# Patient Record
Sex: Female | Born: 1975 | Race: Black or African American | Hispanic: No | Marital: Married | Smoking: Never smoker
Health system: Southern US, Community
[De-identification: ages and names within clinical notes are randomized; demographics above are authoritative.]

## PROBLEM LIST (undated history)

## (undated) DIAGNOSIS — Z9109 Other allergy status, other than to drugs and biological substances: Secondary | ICD-10-CM

## (undated) DIAGNOSIS — J302 Other seasonal allergic rhinitis: Secondary | ICD-10-CM

## (undated) DIAGNOSIS — J449 Chronic obstructive pulmonary disease, unspecified: Secondary | ICD-10-CM

## (undated) DIAGNOSIS — N920 Excessive and frequent menstruation with regular cycle: Secondary | ICD-10-CM

## (undated) DIAGNOSIS — Z5189 Encounter for other specified aftercare: Secondary | ICD-10-CM

## (undated) DIAGNOSIS — E785 Hyperlipidemia, unspecified: Secondary | ICD-10-CM

## (undated) DIAGNOSIS — G4730 Sleep apnea, unspecified: Secondary | ICD-10-CM

## (undated) DIAGNOSIS — D219 Benign neoplasm of connective and other soft tissue, unspecified: Secondary | ICD-10-CM

## (undated) DIAGNOSIS — R0602 Shortness of breath: Secondary | ICD-10-CM

## (undated) DIAGNOSIS — I10 Essential (primary) hypertension: Secondary | ICD-10-CM

## (undated) DIAGNOSIS — K219 Gastro-esophageal reflux disease without esophagitis: Secondary | ICD-10-CM

## (undated) DIAGNOSIS — D649 Anemia, unspecified: Secondary | ICD-10-CM

## (undated) DIAGNOSIS — D259 Leiomyoma of uterus, unspecified: Secondary | ICD-10-CM

## (undated) HISTORY — PX: CHOLECYSTECTOMY: SHX55

## (undated) HISTORY — DX: Excessive and frequent menstruation with regular cycle: N92.0

## (undated) HISTORY — DX: Hyperlipidemia, unspecified: E78.5

## (undated) HISTORY — PX: ECTOPIC PREGNANCY SURGERY: SHX613

## (undated) HISTORY — PX: WISDOM TOOTH EXTRACTION: SHX21

---

## 1998-06-04 ENCOUNTER — Emergency Department (HOSPITAL_COMMUNITY): Admission: EM | Admit: 1998-06-04 | Discharge: 1998-06-04 | Payer: Self-pay | Admitting: Emergency Medicine

## 2000-03-05 ENCOUNTER — Inpatient Hospital Stay (HOSPITAL_COMMUNITY): Admission: AD | Admit: 2000-03-05 | Discharge: 2000-03-05 | Payer: Self-pay | Admitting: Obstetrics & Gynecology

## 2000-11-09 ENCOUNTER — Emergency Department (HOSPITAL_COMMUNITY): Admission: EM | Admit: 2000-11-09 | Discharge: 2000-11-09 | Payer: Self-pay | Admitting: Emergency Medicine

## 2002-05-02 ENCOUNTER — Inpatient Hospital Stay (HOSPITAL_COMMUNITY): Admission: AD | Admit: 2002-05-02 | Discharge: 2002-05-02 | Payer: Self-pay | Admitting: *Deleted

## 2002-05-30 ENCOUNTER — Encounter: Admission: RE | Admit: 2002-05-30 | Discharge: 2002-05-30 | Payer: Self-pay | Admitting: *Deleted

## 2002-05-31 ENCOUNTER — Ambulatory Visit (HOSPITAL_COMMUNITY): Admission: RE | Admit: 2002-05-31 | Discharge: 2002-05-31 | Payer: Self-pay | Admitting: Obstetrics and Gynecology

## 2002-07-03 ENCOUNTER — Ambulatory Visit (HOSPITAL_COMMUNITY): Admission: RE | Admit: 2002-07-03 | Discharge: 2002-07-03 | Payer: Self-pay | Admitting: Obstetrics and Gynecology

## 2002-07-03 ENCOUNTER — Encounter (INDEPENDENT_AMBULATORY_CARE_PROVIDER_SITE_OTHER): Payer: Self-pay

## 2002-07-18 ENCOUNTER — Encounter: Admission: RE | Admit: 2002-07-18 | Discharge: 2002-07-18 | Payer: Self-pay | Admitting: *Deleted

## 2002-09-21 ENCOUNTER — Encounter: Payer: Self-pay | Admitting: Emergency Medicine

## 2002-09-21 ENCOUNTER — Emergency Department (HOSPITAL_COMMUNITY): Admission: EM | Admit: 2002-09-21 | Discharge: 2002-09-21 | Payer: Self-pay | Admitting: Emergency Medicine

## 2002-11-01 ENCOUNTER — Inpatient Hospital Stay (HOSPITAL_COMMUNITY): Admission: AD | Admit: 2002-11-01 | Discharge: 2002-11-01 | Payer: Self-pay | Admitting: Obstetrics and Gynecology

## 2002-11-29 ENCOUNTER — Emergency Department (HOSPITAL_COMMUNITY): Admission: EM | Admit: 2002-11-29 | Discharge: 2002-11-29 | Payer: Self-pay | Admitting: Emergency Medicine

## 2003-03-26 ENCOUNTER — Encounter: Payer: Self-pay | Admitting: Emergency Medicine

## 2003-03-26 ENCOUNTER — Emergency Department (HOSPITAL_COMMUNITY): Admission: EM | Admit: 2003-03-26 | Discharge: 2003-03-26 | Payer: Self-pay | Admitting: Emergency Medicine

## 2003-06-26 ENCOUNTER — Emergency Department (HOSPITAL_COMMUNITY): Admission: EM | Admit: 2003-06-26 | Discharge: 2003-06-26 | Payer: Self-pay | Admitting: *Deleted

## 2003-07-12 ENCOUNTER — Emergency Department (HOSPITAL_COMMUNITY): Admission: EM | Admit: 2003-07-12 | Discharge: 2003-07-12 | Payer: Self-pay | Admitting: Emergency Medicine

## 2003-09-02 ENCOUNTER — Emergency Department (HOSPITAL_COMMUNITY): Admission: EM | Admit: 2003-09-02 | Discharge: 2003-09-02 | Payer: Self-pay | Admitting: *Deleted

## 2003-12-14 ENCOUNTER — Emergency Department (HOSPITAL_COMMUNITY): Admission: EM | Admit: 2003-12-14 | Discharge: 2003-12-14 | Payer: Self-pay | Admitting: Emergency Medicine

## 2004-04-06 ENCOUNTER — Inpatient Hospital Stay (HOSPITAL_COMMUNITY): Admission: EM | Admit: 2004-04-06 | Discharge: 2004-04-08 | Payer: Self-pay

## 2004-06-11 ENCOUNTER — Emergency Department (HOSPITAL_COMMUNITY): Admission: EM | Admit: 2004-06-11 | Discharge: 2004-06-11 | Payer: Self-pay | Admitting: Emergency Medicine

## 2004-06-13 ENCOUNTER — Emergency Department (HOSPITAL_COMMUNITY): Admission: EM | Admit: 2004-06-13 | Discharge: 2004-06-13 | Payer: Self-pay | Admitting: Emergency Medicine

## 2004-10-07 ENCOUNTER — Inpatient Hospital Stay (HOSPITAL_COMMUNITY): Admission: EM | Admit: 2004-10-07 | Discharge: 2004-10-10 | Payer: Self-pay | Admitting: *Deleted

## 2005-01-12 ENCOUNTER — Ambulatory Visit: Payer: Self-pay | Admitting: Nurse Practitioner

## 2005-01-16 ENCOUNTER — Ambulatory Visit: Payer: Self-pay | Admitting: *Deleted

## 2005-02-02 ENCOUNTER — Ambulatory Visit: Payer: Self-pay | Admitting: Nurse Practitioner

## 2005-04-07 ENCOUNTER — Emergency Department (HOSPITAL_COMMUNITY): Admission: EM | Admit: 2005-04-07 | Discharge: 2005-04-08 | Payer: Self-pay | Admitting: *Deleted

## 2005-06-11 ENCOUNTER — Ambulatory Visit: Payer: Self-pay | Admitting: *Deleted

## 2005-06-12 ENCOUNTER — Ambulatory Visit: Payer: Self-pay | Admitting: Nurse Practitioner

## 2005-10-20 ENCOUNTER — Ambulatory Visit: Payer: Self-pay | Admitting: Nurse Practitioner

## 2005-12-17 ENCOUNTER — Inpatient Hospital Stay (HOSPITAL_COMMUNITY): Admission: EM | Admit: 2005-12-17 | Discharge: 2005-12-21 | Payer: Self-pay | Admitting: Emergency Medicine

## 2006-03-29 ENCOUNTER — Ambulatory Visit: Payer: Self-pay | Admitting: Nurse Practitioner

## 2006-04-15 ENCOUNTER — Inpatient Hospital Stay (HOSPITAL_COMMUNITY): Admission: EM | Admit: 2006-04-15 | Discharge: 2006-04-17 | Payer: Self-pay | Admitting: Emergency Medicine

## 2006-06-09 ENCOUNTER — Ambulatory Visit: Payer: Self-pay | Admitting: Internal Medicine

## 2006-06-11 ENCOUNTER — Ambulatory Visit: Payer: Self-pay | Admitting: Internal Medicine

## 2006-06-24 ENCOUNTER — Emergency Department (HOSPITAL_COMMUNITY): Admission: EM | Admit: 2006-06-24 | Discharge: 2006-06-25 | Payer: Self-pay | Admitting: Emergency Medicine

## 2006-08-20 ENCOUNTER — Inpatient Hospital Stay (HOSPITAL_COMMUNITY): Admission: EM | Admit: 2006-08-20 | Discharge: 2006-08-27 | Payer: Self-pay | Admitting: Emergency Medicine

## 2006-11-08 ENCOUNTER — Inpatient Hospital Stay (HOSPITAL_COMMUNITY): Admission: AD | Admit: 2006-11-08 | Discharge: 2006-11-08 | Payer: Self-pay | Admitting: Obstetrics and Gynecology

## 2007-06-14 ENCOUNTER — Emergency Department (HOSPITAL_COMMUNITY): Admission: EM | Admit: 2007-06-14 | Discharge: 2007-06-14 | Payer: Self-pay | Admitting: Emergency Medicine

## 2007-08-11 ENCOUNTER — Inpatient Hospital Stay (HOSPITAL_COMMUNITY): Admission: AD | Admit: 2007-08-11 | Discharge: 2007-08-11 | Payer: Self-pay | Admitting: Obstetrics & Gynecology

## 2007-09-13 ENCOUNTER — Emergency Department (HOSPITAL_COMMUNITY): Admission: EM | Admit: 2007-09-13 | Discharge: 2007-09-13 | Payer: Self-pay | Admitting: Family Medicine

## 2007-10-16 ENCOUNTER — Emergency Department (HOSPITAL_COMMUNITY): Admission: EM | Admit: 2007-10-16 | Discharge: 2007-10-16 | Payer: Self-pay | Admitting: Emergency Medicine

## 2008-11-21 ENCOUNTER — Encounter: Admission: RE | Admit: 2008-11-21 | Discharge: 2008-11-21 | Payer: Self-pay | Admitting: Cardiovascular Disease

## 2009-04-29 ENCOUNTER — Emergency Department (HOSPITAL_COMMUNITY): Admission: EM | Admit: 2009-04-29 | Discharge: 2009-04-29 | Payer: Self-pay | Admitting: Emergency Medicine

## 2009-06-23 ENCOUNTER — Emergency Department (HOSPITAL_COMMUNITY): Admission: EM | Admit: 2009-06-23 | Discharge: 2009-06-23 | Payer: Self-pay | Admitting: Emergency Medicine

## 2009-10-29 ENCOUNTER — Inpatient Hospital Stay (HOSPITAL_COMMUNITY): Admission: AD | Admit: 2009-10-29 | Discharge: 2009-10-30 | Payer: Self-pay | Admitting: Obstetrics and Gynecology

## 2010-02-08 ENCOUNTER — Emergency Department (HOSPITAL_COMMUNITY): Admission: EM | Admit: 2010-02-08 | Discharge: 2010-02-09 | Payer: Self-pay | Admitting: Emergency Medicine

## 2010-02-17 ENCOUNTER — Inpatient Hospital Stay (HOSPITAL_COMMUNITY): Admission: AD | Admit: 2010-02-17 | Discharge: 2010-02-17 | Payer: Self-pay | Admitting: Obstetrics and Gynecology

## 2010-02-18 ENCOUNTER — Inpatient Hospital Stay (HOSPITAL_COMMUNITY): Admission: AD | Admit: 2010-02-18 | Discharge: 2010-02-18 | Payer: Self-pay | Admitting: Obstetrics and Gynecology

## 2010-02-20 ENCOUNTER — Ambulatory Visit (HOSPITAL_COMMUNITY): Admission: RE | Admit: 2010-02-20 | Discharge: 2010-02-20 | Payer: Self-pay | Admitting: Obstetrics and Gynecology

## 2010-02-23 ENCOUNTER — Ambulatory Visit: Admission: RE | Admit: 2010-02-23 | Discharge: 2010-02-23 | Payer: Self-pay | Admitting: Obstetrics and Gynecology

## 2010-02-26 ENCOUNTER — Inpatient Hospital Stay (HOSPITAL_COMMUNITY): Admission: AD | Admit: 2010-02-26 | Discharge: 2010-02-26 | Payer: Self-pay | Admitting: Obstetrics and Gynecology

## 2010-03-01 ENCOUNTER — Inpatient Hospital Stay (HOSPITAL_COMMUNITY): Admission: AD | Admit: 2010-03-01 | Discharge: 2010-03-01 | Payer: Self-pay | Admitting: Obstetrics and Gynecology

## 2010-03-08 ENCOUNTER — Inpatient Hospital Stay (HOSPITAL_COMMUNITY): Admission: AD | Admit: 2010-03-08 | Discharge: 2010-03-08 | Payer: Self-pay | Admitting: Obstetrics and Gynecology

## 2010-03-12 ENCOUNTER — Inpatient Hospital Stay (HOSPITAL_COMMUNITY): Admission: AD | Admit: 2010-03-12 | Discharge: 2010-03-14 | Payer: Self-pay | Admitting: Obstetrics and Gynecology

## 2010-03-12 ENCOUNTER — Encounter (INDEPENDENT_AMBULATORY_CARE_PROVIDER_SITE_OTHER): Payer: Self-pay | Admitting: Obstetrics and Gynecology

## 2010-07-07 ENCOUNTER — Emergency Department (HOSPITAL_COMMUNITY)
Admission: EM | Admit: 2010-07-07 | Discharge: 2010-07-07 | Payer: Self-pay | Source: Home / Self Care | Admitting: Emergency Medicine

## 2010-09-04 ENCOUNTER — Emergency Department (HOSPITAL_COMMUNITY)
Admission: EM | Admit: 2010-09-04 | Discharge: 2010-09-04 | Disposition: A | Discharge status: Home / Self Care | Payer: Medicare Other | Attending: Emergency Medicine | Admitting: Emergency Medicine

## 2010-09-04 DIAGNOSIS — J3489 Other specified disorders of nose and nasal sinuses: Secondary | ICD-10-CM | POA: Insufficient documentation

## 2010-09-04 DIAGNOSIS — R059 Cough, unspecified: Secondary | ICD-10-CM | POA: Insufficient documentation

## 2010-09-04 DIAGNOSIS — R05 Cough: Secondary | ICD-10-CM | POA: Insufficient documentation

## 2010-09-04 DIAGNOSIS — R51 Headache: Secondary | ICD-10-CM | POA: Insufficient documentation

## 2010-09-04 DIAGNOSIS — J45909 Unspecified asthma, uncomplicated: Secondary | ICD-10-CM | POA: Insufficient documentation

## 2010-09-04 LAB — POCT PREGNANCY, URINE: Preg Test, Ur: NEGATIVE

## 2010-09-25 LAB — CBC
HCT: 25.6 % — ABNORMAL LOW (ref 36.0–46.0)
HCT: 27.8 % — ABNORMAL LOW (ref 36.0–46.0)
Hemoglobin: 7.2 g/dL — ABNORMAL LOW (ref 12.0–15.0)
MCH: 20.2 pg — ABNORMAL LOW (ref 26.0–34.0)
MCHC: 31.4 g/dL (ref 30.0–36.0)
MCHC: 31.1 g/dL (ref 30.0–36.0)
MCV: 64.9 fL — ABNORMAL LOW (ref 78.0–100.0)
Platelets: 511 10*3/uL — ABNORMAL HIGH (ref 150–400)
RDW: 22.6 % — ABNORMAL HIGH (ref 11.5–15.5)
RDW: 23.2 % — ABNORMAL HIGH (ref 11.5–15.5)
WBC: 10 10*3/uL (ref 4.0–10.5)
WBC: 13.5 10*3/uL — ABNORMAL HIGH (ref 4.0–10.5)

## 2010-09-25 LAB — CROSSMATCH
ABO/RH(D): O POS
Antibody Screen: NEGATIVE

## 2010-09-25 LAB — CREATININE, SERUM: GFR calc non Af Amer: >60 mL/min (ref >60)

## 2010-09-26 LAB — DIFFERENTIAL
Basophils Absolute: 0.1 10*3/uL (ref 0.0–0.1)
Basophils Relative: 1 % (ref 0–1)
Eosinophils Absolute: 0.5 10*3/uL (ref 0.0–0.7)
Eosinophils Absolute: 0.7 10*3/uL (ref 0.0–0.7)
Lymphs Abs: 1.8 10*3/uL (ref 0.7–4.0)
Monocytes Absolute: 0.4 10*3/uL (ref 0.1–1.0)
Monocytes Absolute: 0.7 10*3/uL (ref 0.1–1.0)
Monocytes Relative: 3 % (ref 3–12)
Monocytes Relative: 5 % (ref 3–12)
Neutro Abs: 9.1 10*3/uL — ABNORMAL HIGH (ref 1.7–7.7)

## 2010-09-26 LAB — CBC
HCT: 30 % — ABNORMAL LOW (ref 36.0–46.0)
HCT: 32.6 % — ABNORMAL LOW (ref 36.0–46.0)
Hemoglobin: 10 g/dL — ABNORMAL LOW (ref 12.0–15.0)
Hemoglobin: 10 g/dL — ABNORMAL LOW (ref 12.0–15.0)
Hemoglobin: 9.1 g/dL — ABNORMAL LOW (ref 12.0–15.0)
MCH: 19.9 pg — ABNORMAL LOW (ref 26.0–34.0)
MCH: 20.1 pg — ABNORMAL LOW (ref 26.0–34.0)
MCHC: 30.8 g/dL (ref 30.0–36.0)
MCV: 64.7 fL — ABNORMAL LOW (ref 78.0–100.0)
MCV: 64.8 fL — ABNORMAL LOW (ref 78.0–100.0)
RBC: 4.99 MIL/uL (ref 3.87–5.11)
RDW: 24.4 % — ABNORMAL HIGH (ref 11.5–15.5)
WBC: 11.9 10*3/uL — ABNORMAL HIGH (ref 4.0–10.5)
WBC: 12.7 10*3/uL — ABNORMAL HIGH (ref 4.0–10.5)

## 2010-09-26 LAB — BUN: BUN: 7 mg/dL (ref 6–23)

## 2010-09-26 LAB — CREATININE, SERUM
Creatinine, Ser: 0.62 mg/dL (ref 0.4–1.2)
GFR calc Af Amer: >60 mL/min (ref >60)
GFR calc non Af Amer: >60 mL/min (ref >60)

## 2010-09-26 LAB — HCG, QUANTITATIVE, PREGNANCY: hCG, Beta Chain, Quant, S: 25751 m[IU]/mL — ABNORMAL HIGH (ref <5)

## 2010-09-26 LAB — AST: AST: 22 U/L (ref 0–37)

## 2010-09-27 LAB — TYPE AND SCREEN
ABO/RH(D): O POS
Antibody Screen: NEGATIVE

## 2010-09-27 LAB — LACTIC ACID, PLASMA: Lactic Acid, Venous: 1.8 mmol/L (ref 0.5–2.2)

## 2010-09-27 LAB — DIFFERENTIAL
Basophils Relative: 0 % (ref 0–1)
Eosinophils Relative: 4 % (ref 0–5)
Lymphs Abs: 1.9 10*3/uL (ref 0.7–4.0)
Monocytes Absolute: 0.8 10*3/uL (ref 0.1–1.0)
Monocytes Relative: 6 % (ref 3–12)

## 2010-09-27 LAB — URINALYSIS, ROUTINE W REFLEX MICROSCOPIC
Bilirubin Urine: NEGATIVE
Ketones, ur: NEGATIVE mg/dL
Leukocytes, UA: NEGATIVE
Nitrite: POSITIVE — AB
Protein, ur: NEGATIVE mg/dL
Urobilinogen, UA: 0.2 mg/dL (ref 0.0–1.0)
pH: 6.5 (ref 5.0–8.0)

## 2010-09-27 LAB — CBC
HCT: 31.5 % — ABNORMAL LOW (ref 36.0–46.0)
Hemoglobin: 9.9 g/dL — ABNORMAL LOW (ref 12.0–15.0)
MCH: 19.6 pg — ABNORMAL LOW (ref 26.0–34.0)
MCHC: 31.4 g/dL (ref 30.0–36.0)
MCV: 62.5 fL — ABNORMAL LOW (ref 78.0–100.0)
RBC: 5.05 MIL/uL (ref 3.87–5.11)

## 2010-09-27 LAB — POCT I-STAT, CHEM 8
BUN: 8 mg/dL (ref 6–23)
Calcium, Ion: 1.12 mmol/L (ref 1.12–1.32)
Chloride: 105 mEq/L (ref 96–112)
Creatinine, Ser: 0.9 mg/dL (ref 0.4–1.2)
TCO2: 22 mmol/L (ref 0–100)

## 2010-09-27 LAB — WET PREP, GENITAL: Trich, Wet Prep: NONE SEEN

## 2010-09-27 LAB — GC/CHLAMYDIA PROBE AMP, GENITAL: Chlamydia, DNA Probe: NEGATIVE

## 2010-09-30 LAB — URINALYSIS, ROUTINE W REFLEX MICROSCOPIC
Leukocytes, UA: NEGATIVE
Nitrite: NEGATIVE
Specific Gravity, Urine: >1.03 — ABNORMAL HIGH (ref 1.005–1.030)
Urobilinogen, UA: 0.2 mg/dL (ref 0.0–1.0)

## 2010-09-30 LAB — URINE CULTURE: Colony Count: 75000

## 2010-09-30 LAB — URINE MICROSCOPIC-ADD ON

## 2010-09-30 LAB — POCT PREGNANCY, URINE: Preg Test, Ur: NEGATIVE

## 2010-10-31 ENCOUNTER — Emergency Department (HOSPITAL_COMMUNITY): Payer: Medicare Other

## 2010-10-31 ENCOUNTER — Emergency Department (HOSPITAL_COMMUNITY)
Admission: EM | Admit: 2010-10-31 | Discharge: 2010-10-31 | Disposition: A | Discharge status: Home / Self Care | Payer: Medicare Other | Attending: Emergency Medicine | Admitting: Emergency Medicine

## 2010-10-31 DIAGNOSIS — J329 Chronic sinusitis, unspecified: Secondary | ICD-10-CM | POA: Insufficient documentation

## 2010-10-31 DIAGNOSIS — R112 Nausea with vomiting, unspecified: Secondary | ICD-10-CM | POA: Insufficient documentation

## 2010-10-31 DIAGNOSIS — R51 Headache: Secondary | ICD-10-CM | POA: Insufficient documentation

## 2010-10-31 LAB — COMPREHENSIVE METABOLIC PANEL
AST: 22 U/L (ref 0–37)
Albumin: 3.3 g/dL — ABNORMAL LOW (ref 3.5–5.2)
BUN: 5 mg/dL — ABNORMAL LOW (ref 6–23)
Calcium: 8.9 mg/dL (ref 8.4–10.5)
Creatinine, Ser: 0.76 mg/dL (ref 0.4–1.2)
GFR calc Af Amer: >60 mL/min (ref >60)
Total Protein: 7.3 g/dL (ref 6.0–8.3)

## 2010-10-31 LAB — POCT PREGNANCY, URINE: Preg Test, Ur: NEGATIVE

## 2010-11-28 NOTE — Op Note (Signed)
   NAMEGUILLERMINA, [name]                        ACCOUNT NO.:  [id number]   MEDICAL RECORD NO.:  [id number]                   PATIENT TYPE:  AMB   LOCATION:  SDC                                  FACILITY:  [facility]   PHYSICIAN:  Phil D. Okey Dupre, M.D.                  DATE OF BIRTH:  [birth year]   DATE OF PROCEDURE:  07/03/2002  DATE OF DISCHARGE:                                 OPERATIVE REPORT   PROCEDURE:  1. Diagnostic hysteroscopy.  2. Dilatation and curettage.   PREOPERATIVE DIAGNOSES:  Dysfunctional uterine bleeding.   POSTOPERATIVE DIAGNOSES:  Pending pathology report.   SURGEON:  Javier Glazier. Okey Dupre, M.D.   OPERATIVE FINDINGS:  With the hysteroscope, because of the amount of  bleeding in the endometrial cavity in spite of the amount of fluid put in,  we were unable to see anything definitive.  Significant amount of uterine  curettings, however, were obtained and sent for pathologic diagnosis.   PROCEDURE:  Under satisfactory general anesthesia the patient in a dorsal  lithotomy position, the perineum and vagina were prepped and draped in the  usual sterile manner.  Bimanual pelvic examination under anesthesia failed  to reveal the size of the uterus because of the patient's habitus.  Ovaries  likewise could not be palpated.  A weighted speculum was placed in the  posterior fourchette of the vagina and the anterior lip of a clean parous  cervix was grasped with a single tooth tenaculum.  The uterine cavity  sounded to a depth of 9 cm.  The cervical os dilated up to a number 8 Hegar  dilator and a 30 degree hysteroscope using normal saline as dilating median  was inserted into the uterine cavity to the fundus and many attempts were  made to visualize the endometrial cavity, but without success as  aforementioned.  The scope was then removed, the uterine cavity vigorously  curetted with a small serrated curette several times and a large amount of  endometrial tissue was obtained along  with some small dark clots.  This was  sent for pathological diagnosis.  A sponge on a packing forceps was then  used to complete procedure by endometrial wipe and free blood removed from  the vagina.  Tenaculum and speculum removed from the vagina.  The patient  was transferred to recovery room in satisfactory condition with 100 cc blood  loss.  The surgical specimen sent for pathological diagnosis were  endometrial curettings.                                               Phil D. Okey Dupre, M.D.    PDR/MEDQ  D:  07/03/2002  T:  07/03/2002  Job:  [id number]

## 2010-11-28 NOTE — Discharge Summary (Signed)
[name], [name]              ACCOUNT NO.:  [id number]   MEDICAL RECORD NO.:  [id number]           PATIENT TYPE:   LOCATION:                               FACILITY:  [facility]   PHYSICIAN:  Deirdre Peer. Polite, M.D. DATE OF BIRTH:  1975-10-02   DATE OF ADMISSION:  04/15/2006  DATE OF DISCHARGE:  04/17/2006                                 DISCHARGE SUMMARY   DISCHARGE DIAGNOSIS:  __________ .   CONDITION ON DISCHARGE:  Improved.   DISCHARGE MEDICATIONS:  1. DuoNebs every 4-6 hours.  2. Pulmicort rescues q.12 h.  3. The patient is to resume her __________ ,  Zyrtec, and Nasacort.  4. Will continue azithromycin 500 mg daily for three more days.   STUDIES:  1. Chest x-ray without infiltrate.  2. BMET.  3. CBC essentially unremarkable.  4. Peak flows in the range of 200-250 pre and post treatment.   HISTORY OF PRESENT ILLNESS:  This 35 year old female was admitted to the  hospital for shortness of breath and wheeze x3 days in the ED where she was  evaluated.  She was given appropriate treatment.  Despite the above the  patient continued with shortness of breath and wheezing.  Therefore  admission was deemed necessary.  Please see dictated H&P for further  details.   PAST MEDICAL HISTORY:  Per admission H&P.   MEDICATIONS ON ADMISSION:  As stated above.  Singulair, Zyrtec, albuterol.   SOCIAL HISTORY:  Negative for tobacco.   HOSPITAL COURSE:  The patient was admitted to stepdown unit for further  evaluation.  __________  the patient was treated with antibiotics, O2,  nebulizers, and IV steroids.  Please note the patient states she is allergic  to P.O. STEROIDS.  The patient's medical course was one of slow, but  continued improvement.  Today the patient is moving air well without  accessory muscle use, speaking in complete sentences.  Oxygen saturations  are 95% on room air.  The patient is felt to be medically stable for  discharge.  The only __________  is the patient's  documented allergy to p.o.  prednisone.  Inhaled steroids will be added to the patient's medical regimen  and the patient will be  given a loading dose of IV Solu-Medrol prior to discharge.  The patient will  continue medications as stated above.  She has been instructed to stay in a  cool, air-conditioned room and avoid any triggers of her asthma.  At this  time the patient is medically stable for discharge.      Deirdre Peer. Polite, M.D.  Electronically Signed     RDP/MEDQ  D:  04/17/2006  T:  04/18/2006  Job:  [id number]

## 2010-11-28 NOTE — H&P (Signed)
NAMEPERLA, [name]              ACCOUNT NO.:  [id number]   MEDICAL RECORD NO.:  [id number]          PATIENT TYPE:  INP   LOCATION:  0101                         FACILITY:  [facility]   PHYSICIAN:  Theresia Bough, MD       DATE OF BIRTH:  [birth year]   DATE OF ADMISSION:  08/20/2006  DATE OF DISCHARGE:                              HISTORY & PHYSICAL   PRESENTING COMPLAINT:  Cough and wheezing.   HISTORY OF PRESENT ILLNESS:  This is a 35 year old female patient who  presented with cough.  She has been wheezing for the past one week.  She  has been taking Zyrtec, Singulair, and DuoNeb at home, which has not  resolved the problems.  She has a history of asthma.  She comes to  admission in the hospital about 2-3 times every year.  She came into the  hospital today because she has not been feeling better.  She denies any  nausea.  No vomiting or diarrhea.  No headaches.  No dysuria.  No feet  swelling.  No joint pain.   PAST MEDICAL HISTORY:  Asthma.   SOCIAL HISTORY:  She does not smoke cigarettes.  She does not use any  alcohol.   FAMILY HISTORY:  Noncontributory.   HOME MEDICATIONS:  Singulair, Zyrtec, albuterol, DuoNeb.   She has allergies to PREDNISONE, which causes a rash.   PHYSICAL EXAMINATION:  VITAL SIGNS:  Blood pressure 140/90, pulse rate  99, respiratory rate 18, oxygen saturation 96% on room air.  HEAD/NECK:  Pink conjunctivae.  She has no jaundice.  Her neck is  supple.  Mucous membranes are moist.  CHEST:  Bilateral wheezing.  CARDIOVASCULAR:  Normal heart sounds.  No murmur.  No gallop.  ABDOMEN:  Soft and nontender.  No masses palpable.  Patient has normal  bowel sounds.  EXTREMITIES:  No edema.  CENTRAL NERVOUS SYSTEM:  Patient is alert and oriented x3.  Pulse is 4/4  in all limbs.  Gait is normal.   Blood work shows a WBC of 15.1, hemoglobin 11, hematocrit 34, platelets  456.  Sodium 133, potassium 3.6, chloride 104, bicarb 23, BUN 7,  creatinine 0.7, glucose  107.   ASSESSMENT:  1. Asthma exacerbation.  2. Leukocytosis.   The plan is to admit the patient to a medical floor.  Continue albuterol  2.5 mg nebs q.6h., Atrovent 0.5 mg nebs q.6h. and q.2h. p.r.n. wheezing.  I will continue on Singulair 10 mg daily, Zyrtec 10 mg p.o. daily, and I  will give IV  Zithromax 500 mg q.24h.  I will also give IV dexamethasone 4 mg q.6h. to  begin  at 4:30 a.m. on Saturday.  Patient has received dexamethasone 10  mg p.o. at about 4:30 p.m. this afternoon.  I will also give Lovenox 40  mg daily for DVT prophylasix.      Theresia Bough, MD  Electronically Signed     GA/MEDQ  D:  08/20/2006  T:  08/20/2006  Job:  [id number]

## 2010-11-28 NOTE — H&P (Signed)
[name], [name]              ACCOUNT NO.:  [id number]   MEDICAL RECORD NO.:  [id number]          PATIENT TYPE:  INP   LOCATION:  0103                         FACILITY:  [facility]   PHYSICIAN:  Deirdre Peer. Polite, M.D. DATE OF BIRTH:  10/09/1975   DATE OF ADMISSION:  04/15/2006  DATE OF DISCHARGE:                                HISTORY & PHYSICAL   CHIEF COMPLAINT:  Asthma exacerbation.   HISTORY OF PRESENT ILLNESS:  A 35 year old female with a known history of  asthma who presents to the ED for evaluation of the above chief complaint.  According to the patient, she had started with upper respiratory/cold  symptoms on Friday associated with productive cough of yellow-green sputum  and shortness of breath.  Patient took Theraflu at home without improvement  in her symptoms.  Patient's symptoms progressively got worse over the  weekend with chest tightness and wheezing.  Because of that, she presented  to the ED.  In the ED, the patient was evaluated and had a low-grade temp of  99.  Chest x-ray without infiltrate.  The patient was given appropriate  treatment in the ED without improvement.  Admission was deemed necessary for  further evaluation and treatment.  Of note, the patient did have a sick  contact at home, her younger brother, age 88, with cold symptoms.   PAST MEDICAL HISTORY:  Significant for:  1. Asthma.  2. 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  3. The patient denies diabetes or hypertension.   MEDICATIONS ON ADMISSION:  Singulair, Zyrtec, albuterol.   SOCIAL HISTORY:  Negative for tobacco, alcohol or drugs.   PAST SURGICAL HISTORY:  The patient had removal of a thyroid mass 4 years  ago, states that the path was benign.   ALLERGIES:  STATES ALLERGY TO PREDNISONE WHICH CAUSES A RASH.   FAMILY HISTORY:  Mother with diabetes, hypertension.  In other family  members, their medical history was unremarkable.   REVIEW OF SYSTEMS:  As stated  in the HPI.   PHYSICAL EXAM:  GENERAL:  The patient is in moderate distress secondary  shortness of breath.  VITALS:  Temp 99, BP 144/89, pulse 110, respiratory rate 20, sating 98%.  HEENT EXAM:  Anicteric sclera, moist oral mucosa.  NECK:  Supple, no adenopathy.  CHEST:  Bilateral expiratory wheeze with poor air movement.  CARDIOVASCULAR:  Regular.  ABDOMEN:  Obese, no hepatosplenomegaly appreciated.  EXTREMITIES:  No edema.  NEURO EXAM:  Nonfocal.   ASSESSMENT:  1. Asthma exacerbation which appears to have started from an upper      respiratory infection.  Currently the patient is with poor air movement      and accessory muscles used for respiration, requiring inpatient      hospitalization.  2. Elevated blood pressure.  By report, patient denies history of      hypertension.  3. Obesity.   Recommend the patient be admitted to a Step Down Unit for treatment of  respiratory distress associated with asthma exacerbation.  The patient will  receive IV antibiotics, O2 nebs, steroids.  Will obtain a sputum culture if  possible.  Will make further recommendations as deemed necessary.      Deirdre Peer. Polite, M.D.  Electronically Signed     RDP/MEDQ  D:  04/15/2006  T:  04/15/2006  Job:  [id number]

## 2010-11-28 NOTE — Discharge Summary (Signed)
NAMEYOBANA, CULLITON              ACCOUNT NO.:  [id number]   MEDICAL RECORD NO.:  [id number]          PATIENT TYPE:  INP   LOCATION:  0382                         FACILITY:  [facility]   PHYSICIAN:  Corinna L. Lendell Caprice, MDDATE OF BIRTH:  05-Aug-1975   DATE OF ADMISSION:  10/07/2004  DATE OF DISCHARGE:  10/10/2004                                 DISCHARGE SUMMARY   DIAGNOSES:  1.  Community-acquired pneumonia.  2.  Asthma exacerbation.  3.  Obesity.   DISCHARGE MEDICATIONS:  1.  Avelox 400 mg daily for seven more days.  2.  Prednisone taper as directed.  3.  Advair 250/50 one puff b.i.d.  4.  Albuterol MDI two puffs every four hours as needed for wheezing.   ACTIVITY:  No heavy exertion for seven days.  She may return to work in 10  days.   DIET:  Regular.   DISCHARGE INSTRUCTIONS:  She is to measure her peak flows daily.  She is to  follow up with primary care physician of her choice in four to six weeks to  ensure resolution of pneumonia.   CONDITION ON DISCHARGE:  Stable.   CONSULTATIONS:  None.   PROCEDURES:  None.   PERTINENT LABORATORIES:  ABG on room air shows pH of 7.410, PCO2 30, PO2 73.  Complete metabolic panel significant for a glucose of 143. CBC significant  for hemoglobin of 11.7, white blood cell count of 11, platelet count of 432  with 95% neutrophils.  LFTs within normal limits.  Chest x-ray showed  interstitial pneumonia.   HISTORY AND HOSPITAL COURSE:  Ms. [name] is a pleasant, unassigned 35-year-  old black female with a history of asthma who presented with complaints of  worsening dyspnea over the previous two days.  She had a cough productive of  yellow sputum for several weeks and chest tightness.  She also reported  rhinorrhea.   On exam, she had diffuse wheezing.  Please see H&P for complete details.  The patient was found to have a pneumonia and started on oxygen, nebulizers,  steroids, Avelox, and Advair.  Her peak flows were measured.  At  the  time of discharge her wheezing had diminished tremendously.  She was able to  ambulate the halls with minimal difficulty and was feeling better.  She has  no primary care physician and I am referring her to the [facility]  Line to find a primary care physician.      CLS/MEDQ  D:  10/10/2004  T:  10/10/2004  Job:  [id number]

## 2010-11-28 NOTE — H&P (Signed)
NAMEMASSA, PE              ACCOUNT NO.:  [id number]   MEDICAL RECORD NO.:  [id number]          PATIENT TYPE:  INP   LOCATION:  0382                         [facility]ACILITY:  [facility]   PHYSICIAN:  Kela Millin, M.D.DATE OF BIRTH:  1976-04-13   DATE OF ADMISSION:  10/07/2004  DATE OF DISCHARGE:                                HISTORY & PHYSICAL   PRIMARY CARE PHYSICIAN:  Unassigned.   HISTORY OF PRESENT ILLNESS:  The patient is a 35 year old black female with  a past medical history significant for asthma, who presents with complaints  of worsening shortness of breath x2 days.  She states that she has had a  cough productive of yellowish sputum x2 weeks, and associated chest  tightness.  The patient admits to nasal congestion, as well as rhinorrhea,  that she states she has all the time.  She denies fevers, dysuria, nausea,  vomiting, hematemesis, hematochezia, diarrhea, melena.   The patient was seen in the ER, and following nebulized bronchodilators and  IV steroids, her shortness of breath persisted, and she is admitted to the  [facility] hospitalist service to further evaluation and management.   PAST MEDICAL HISTORY:  1.  As above.  2.  History of microcytic anemia.  3.  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   MEDICATIONS:  1.  Albuterol M.D.I.  2.  Atrovent M.D.I.  3.  Flovent.  4.  Iron supplements.  States she has not taken any for some time because      she ran out.   REACTIONS:  PREDNISONE causes acne breakout/(?) a rash.   SOCIAL HISTORY:  She denies tobacco.  Occasional alcohol.   FAMILY HISTORY:  Her mom has hypertension and diabetes.   REVIEW OF SYSTEMS:  As per HPI.  Other comprehensive review of systems  negative.   PHYSICAL EXAMINATION:  GENERAL:  The patient is a young black female, alert  and oriented x3, in no acute distress.  VITAL SIGNS:  Blood pressure 140/95, pulse 89, respiratory rate 22, O2  saturation 93%.  HEENT:   Pupils equal, round and reactive to light.  Extraocular movements  intact.  Moist mucous membranes.  Sclerae were anicteric.  No oral exudates.  NECK:  Supple.  No adenopathy.  No thyromegaly.  No JVD.  LUNGS:  Diffuse wheezes bilaterally.  CARDIOVASCULAR:  Tachycardic.  Normal S1 and S2.  Regular rhythm.  ABDOMEN:  Soft, obese.  Bowel sounds present.  Nontender, nondistended.  No  organomegaly.  No masses palpable.  EXTREMITIES:  No cyanosis or edema.  NEUROLOGIC:  Alert and oriented x3.  Nonfocal exam.   LABORATORY DATA:  Chest x-ray, CMET, CBC, and ABG all pending.   ASSESSMENT AND PLAN:  1.  Asthma exacerbation.  Will continue nebulized bronchodilators.  Change      to Xopenex, IV Solu-Medrol, antibiotics, and also add __________.  Will      follow a chest x-ray, CBC, and ABG above.  2.  History of microcytic anemia.  Follow CBC, as above.  3.  Obesity.      ACV/MEDQ  D:  10/07/2004  T:  10/07/2004  Job:  [id number]

## 2010-11-28 NOTE — H&P (Signed)
[name], [name]              ACCOUNT NO.:  [id number]   MEDICAL RECORD NO.:  [id number]          PATIENT TYPE:  EMS   LOCATION:  ED                           FACILITY:  [facility]   PHYSICIAN:  Deirdre Peer. Polite, M.D. DATE OF BIRTH:  [birth year]   DATE OF ADMISSION:  12/17/2005  DATE OF DISCHARGE:                                HISTORY & PHYSICAL   CHIEF COMPLAINT:  Shortness of breath and wheeze.   HISTORY OF PRESENT ILLNESS:  A 35 year old female with known history of  asthma, presents to the ED with intractable shortness of breath, wheezing,  cough consistent with her typical asthma exacerbation.  The patient's  symptoms have been going on for about a week, cough with occasional  productive cough, increased use of inhalers.  The patient states her typical  triggers for asthma are a change in season, tobacco smoke, perfumes,  colognes.  The patient states that she does not smoke.  She has been taking  her medicines as tolerated.  The patient states that she has been around  someone that is a heavy smoker and that may be somewhat of a contributing  factor to her current exacerbation.  In the ED, the patient has been  evaluated.  She is afebrile, but with significant wheeze, chest x-ray with  peribronchial thickening, no infiltrate. [facility] hospitalist was called for  evaluation and admission.  At the time of my evaluation, the patient is  still very dyspnea and complaining of shortness of breath and admission is  deemed necessary for further evaluation and treatment.   PAST MEDICAL HISTORY:  Past medical history is significant for asthma.   MEDICATIONS ON ADMISSION:  Medications on admission include Singulair,  Zyrtec and albuterol.   SOCIAL HISTORY:  Social history is negative for tobacco, alcohol or drugs.   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   ALLERGIES:  Reports allergy to PREDNISONE, which causes a rash.   FAMILY HISTORY:   Noncontributory.   PHYSICAL EXAMINATION:  VITAL SIGNS:  Temperature 97.9, BP 172/104, pulse 93,  respiratory rate of 26, SAT 99%.  HEENT:  Within normal limits.  CHEST:  Bilateral expiratory wheeze.  CARDIOVASCULAR:  Regular.  ABDOMEN:  Nontender.  EXTREMITIES:  No edema.   DATA:  Chest x-ray:  Peribronchial thickening, no infiltrate.   Other labs are pending at the time of this dictation.   ASSESSMENT:  Asthma exacerbation.   RECOMMENDATION:  The patient will be admitted to a stepdown unit.  The  patient will be treated with IV antibiotics, O2, nebulizers and steroids.  We will attempt to obtain a sputum culture and make further recommendations  as deemed necessary.      Deirdre Peer. Polite, M.D.  Electronically Signed     RDP/MEDQ  D:  12/17/2005  T:  12/17/2005  Job:  [id number]

## 2010-11-28 NOTE — Discharge Summary (Signed)
[name], [name]              ACCOUNT NO.:  [id number]   MEDICAL RECORD NO.:  [id number]          PATIENT TYPE:  INP   LOCATION:  1332                         FACILITY:  [facility]   PHYSICIAN:  Marcellus Scott, MD     DATE OF BIRTH:  1975-08-10   DATE OF ADMISSION:  08/20/2006  DATE OF DISCHARGE:                               DISCHARGE SUMMARY   PRIMARY CARE PHYSICIAN:  Dr. Duke Salvia at [facility]   DISCHARGE DIAGNOSES:  1. Acute asthma exacerbation.  2. Leukocytosis.  3. Microcytic anemia.  4. Hyperglycemia.  5. Obesity.   DISCHARGE MEDICATIONS:  To be finalized on actual discharge, but may  include:  1. Singulair 10 mg p.o. daily.  2. Claritin 10 mg p.o. daily.  3. Mucinex 600 mg p.o. b.i.d.  4. Prednisone taper.  5. Atrovent 17 mcg per spray 2 puffs inhaled q.i.d. p.r.n.  6. Xopenex HFA 2 puffs q.4-6h. p.r.n.  7. Zithromax to complete a 1 week course.  8. Robitussin DM 10 mL p.o. q.4h. p.r.n.   PROCEDURES:  August 20, 2006, chest x-ray.  Impression is no acute  findings.   CONSULTATIONS:  None.   HOSPITAL COURSE AND PATIENT DISPOSITION:  For details of the initial  part of the admission, please refer to the history and physical note  that was done by Dr. Hadley Pen on August 20, 2006.  In summary, [name]  is a pleasant 35 year old female with history of asthma, anemia, who  presented with cough, wheezing for 1 week prior to admission which was  not relieved with home treatment of Singulair, Zyrtec, and DuoNeb.  So  she was evaluated in the hospital and assessed to have an acute asthma  exacerbation.   1. Acute acute exacerbation.  The patient was admitted to a medical      bed.  She was placed on oxygen, bronchodilator nebulizations of      initially albuterol which was changed to Xopenex and Atrovent.  She      was also initially on dexamethasone which was switched to IV Solu-      Medrol.  She was placed on IV Zithromax.  Mucinex was added to      loosen up  the secretions and Robitussin DM for cough.  Over the      next 24-48 hours, the patient did not make significant recovery      following which Solu-Medrol was increased to 80 mg q.4h.  Since      then, the patient has gradually improved with decrease in her      dyspnea.  She still has some cough and is generally weak.  She will      be switched to oral steroids from today and if she continues to      progress well, she may be able to go home tomorrow on oral      antibiotics course as well as steroid taper and bronchodilator      inhalers.  2. Leukocytosis.  This is probably secondary to stress and the effect      of the steroids.  There is  no focus of sepsis.  This is to be      rechecked with a CBC tomorrow.  3. Microcytic anemia which is probably secondary to her menorrhagia.      This has to be worked up as an outpatient as deemed necessary.  4. Obesity.  The patient has been counseled regarding diet and weight      loss.  5. Hyperglycemia probably secondary to steroids.  The patient was      placed on a sliding-scale insulin. With a tapering off of the      steroids this should normalize, and this has to be followed up.  6. Hyponatremia on admission which has resolved.      Marcellus Scott, MD  Electronically Signed     AH/MEDQ  D:  08/24/2006  T:  08/24/2006  Job:  [id number]

## 2010-11-28 NOTE — H&P (Signed)
NAMEJENISSA, [name]              ACCOUNT NO.:  [id number]   MEDICAL RECORD NO.:  [id number]          PATIENT TYPE:  INP   LOCATION:  0464                         FACILITY:  [facility]   PHYSICIAN:  Mallory Shirk, MD     DATE OF BIRTH:  1975-11-12   DATE OF ADMISSION:  04/06/2004  DATE OF DISCHARGE:                                HISTORY & PHYSICAL   CHIEF COMPLAINT:  Difficulty breathing/asthma exacerbation.   HISTORY OF PRESENT ILLNESS:  A 35 year old African-American lady with a  history of asthma since childhood diagnosed early in childhood, on inhaler  since age 83, presents with difficulty breathing.  The patient states that  symptoms started about a week ago, when she had some rhinorrhea, continuous  cough, no fever or chills.  Albuterol inhalers did not help, so she tried  albuterol nebulizers which also did not help.  This a.m., when she woke up,  she was coughing continuously, had trouble breathing, and presented to the  emergency room.   The patient was seen in this emergency room in June 2005 with a similar  exacerbation and was discharged on Atrovent and albuterol nebs.  The patient  states that she could not fill the prescription for Atrovent because of  financial reasons.  Hence, did not fill that prescription.  However, her  asthma was well-managed by albuterol inhalers until last week.   PAST MEDICAL HISTORY:  1.  Asthma diagnosed in childhood.  On inhaler since 35 years of age.  2.  Thyroid tumors surgically removed at [facility] in December 2003.   MEDICATIONS ON ADMISSION:  1.  Albuterol inhalers, nebs.  2.  Over-the-counter cold medications (store-bought pseudofed per patient).   ALLERGIES:  NKDA.   FAMILY HISTORY:  Significant for diabetes and hypertension in mother,  hypertension in father, has five brothers in good health except seasonal  allergies.   SOCIAL HISTORY:  The patient is a Lawyer, works as a Cabin crew.  She is single.  Does not  smoke.  Occasional alcohol use 2-3 times a week,  Bacardi coolers.   REVIEW OF SYSTEMS:  HEENT:  Mild headaches, no neck pain, no changes in  vision, no dysphagia, no leg stiffness.  CVS:  Chest pain secondary to  cough, tightness secondary to cough, but no continuous chest pain.  RESPIRATORY:  Positive shortness of breath, exertional dyspnea.  ABDOMEN:  No pain, no nausea, vomiting, diarrhea, or trace change in bowel habits  recently.  MUSCULOSKELETAL:  Left ankle swollen.  The patient has had an  injury to this ankle several years ago and periodically swells up and is  sometimes tender.   PHYSICAL EXAMINATION:  VITAL SIGNS ON ADMISSION:  Blood pressure 119/62,  pulse 115, respirations 40, saturations 96%, temperature 98.0 oral.  GENERAL:  Young African-American woman sitting up in bed in no acute  distress.  Alert and oriented x 3.  Cooperative with the exam.  HEENT:  Normocephalic, atraumatic.  Pupils equal, round, and reactive to  light.  NECK:  Supple.  No JVD.  No lymphadenopathy.  CVS:  S1 plus  S2, tachycardic.  No murmurs, rubs, or gallops.  RESPIRATORY:  Bilateral expiratory wheezes anteriorly and posteriorly.  Some  respiratory difficulty noted when patient is talking.  No rales.  ABDOMEN:  Positive bowel sounds.  Soft, nontender.  There are no masses.  EXTREMITIES:  No cyanosis or clubbing.  Mild edema in the left ankle.  Tender to touch on the lateral malleolus.  NEUROLOGIC:  Cranial nerves 2-12 grossly intact.  Sensory, motor within  normal limits.  DTRs 2+ and symmetrical all four extremities.  Nonfocal  neurologic sign seen.   LABORATORY DATA:  WBC 12,000, hemoglobin 11.0, hematocrit 35.0, MCV 64.0,  platelet count 440,000.  Sodium 137, potassium 3.7, chloride 108, carbon  dioxide 25, glucose __________, BUN 10, creatinine 1.0, calcium 8.7.  ABG on  room air:  pH 7.378, pCO2 32.3, pO2 __________.0, bicarbonate 18.6.   ASSESSMENT AND PLAN:  A 35 year old  African-American woman with a history of  asthma since childhood on albuterol inhalers and nebs presents with acute  asthma exacerbation.  1.  Asthma exacerbation, likely triggered by an upper respiratory infection.      Received 80 mg of prednisone p.o. in emergency department and albuterol      and Atrovent nebs.  Albuterol and Atrovent nebs will be continued q.4h.      Prednisone will be continued at 60 mg p.o. daily, starting tomorrow.  2.  Microcytic anemia.  We will get iron studies and supplement with iron if      needed for further work-up which may be done as an outpatient.  3.  Nutrition.  The patient is put on a regular diet with IV fluids and      normal saline at 125 mL/h.  4.  Disposition.  After resolution of acute exacerbation, the patient will      be discharged a prednisone taper, inhaled steroids, and albuterol      inhalers.      GDK/MEDQ  D:  04/06/2004  T:  04/06/2004  Job:  [id number]

## 2010-11-28 NOTE — Consult Note (Signed)
   [name], [name]                          ACCOUNT NO.:  [id number]   MEDICAL RECORD NO.:  [id number]                   PATIENT TYPE:   LOCATION:                                       FACILITY:   PHYSICIAN:  Phil D. Okey Dupre, M.D.                  DATE OF BIRTH:   DATE OF CONSULTATION:  DATE OF DISCHARGE:                                   CONSULTATION   CHIEF COMPLAINT:  Vaginal bleeding since October.   HISTORY OF PRESENT ILLNESS:  The patient is a 35 year old Gravida 0 black  female with regular periods. Never missed a month until July 2003 when she  became somewhat irregular. She missed her period in August. Had one in  September that lasted seven days. On April 18, 2002, she claims to have  been bleeding daily. She went to the emergency room at that time and was  found to have a hemoglobin of 10. She was referred to the GYN clinic.  Meanwhile, she was placed on oral contraceptives that did not control the  bleeding.   FAMILY HISTORY:  Diabetes mellitus.   ALLERGIES:  No known drug allergies.   PAST MEDICAL HISTORY:  She has bronchospasm and bronchial asthma but only  took over-the-counter medications. Otherwise, has no significant past  medical history.   REVIEW OF SYSTEMS:  The patient was always in good health and had no  negative review of systems with exception of the abnormal bleeding.   PHYSICAL EXAMINATION:  VITAL SIGNS: Blood pressure 110/70, pulse 86,  temperature 98.7, respiratory rate 14.  GENERAL: A well developed, well nourished, obese black female with no  apparent distress.  HEENT: Within normal limits.  LUNGS: Clear to auscultation and percussion.  HEART: Normal sinus rhythm.  ABDOMEN: Soft, flat, nontender with no masses or organomegaly.  BACK: No cardiovascular tenderness.  GU: External genitalia was normal. Introitus was marital. Vagina clean and  well rugated with normal cervix and no bleeding. The uterus and adnexa could  not be outlined on  bimanual pelvic because of the habitus of the patient.  RECTAL: Negative.  EXTREMITIES: Negative. Deep tendon reflexes normal. No edema or varices. No  lymphadenopathy noted.  SKIN: Normal turgor. H   IMPRESSION:  The patient with menometorrhagia.   PLAN:  Admission for dilatation, curettage, and hysteroscopy.                                               Phil D. Okey Dupre, M.D.   PDR/MEDQ  D:  08/17/2002  T:  08/18/2002  Job:  [id number]

## 2010-11-28 NOTE — Discharge Summary (Signed)
[name], APPLE              ACCOUNT NO.:  [id number]   MEDICAL RECORD NO.:  [id number]          PATIENT TYPE:  INP   LOCATION:  1332                         [facility]ACILITY:  [facility]   PHYSICIAN:  Michaelyn Barter, M.D. DATE OF BIRTH:  03/13/1976   DATE OF ADMISSION:  08/20/2006  DATE OF DISCHARGE:  08/27/2006                               DISCHARGE SUMMARY   FINAL DIAGNOSIS:  1. Acute asthma exacerbation.  2. Leukocytosis.  3. Anemia.   PRIMARY CARE PHYSICIAN:  Unassigned.   PROCEDURES:  Chest x-ray completed on February8, 2008.   HISTORY OF PRESENT ILLNESS:  Ms. [name] is a 35 year old female with a  past medical history of asthma.  She stated that for approximately 1  week she had been wheezing.  She also complained of having a cough and  stated that, despite taking her home medications, her symptoms had not  resolved.   PAST MEDICAL HISTORY:  Please see that dictated by Dr. Hadley Pen.   HOSPITAL COURSE:  1. Acute asthma exacerbation.  A chest x-ray was completed on February      8th.  It revealed the lungs to be clear.  No focal airspace disease      was noted.  A small amount of atelectasis was noted in the right      lower lung region.  The patient was started on IV Solu-Medrol.      Initially, she received 4 mg q.6 h. which was subsequently      increased up to 60 mg q.6 h.  Over the course of her      hospitalization, her breathing improved, and her steroid dose was      gradually tapered.  The patient indicated that she did have a      history of allergies to prednisone.  Therefore, the patient was      never switched to p.o. medications but remained on IV Solu-Medrol      throughout the course of hospitalization.  By the date of      discharge, she was down to 40 mg IV q.24 h.  Her shortness of      breath resolved during the course of her hospitalization.  2. Leukocytosis.  This was believed to have been a reaction to the      patient's steroid.  The patient remained  afebrile throughout the      course of her hospitalization.  Empiric antibiotics were started on      the patient, however, and again the chest x-ray never showed any      obvious signs of infection.  3. Anemia.  This was simply monitored over the course of her      hospitalization, and it remained stable.  4. Hypertension.  The patient stated that she does typically have      elevated blood pressure, whenever she takes steroids.  As a result,      a low dose of Norvasc was initiated.   CONDITION AT THE TIME OF DISCHARGE:  Currently, the patient states that  her breathing is better, and she states that she is ready  to go home.  Her vitals:  Her temperature is 98.7, heart rate 69, respirations 16-18,  blood pressure 155/99, O2 sat 97% on room air, CBG 88.  The decision has  been made to discharge the patient from the hospital.   DISCHARGE MEDICATIONS:  1. The patient will be discharged home on Norvasc 2.5 mg p.o. daily.  2. Mucinex 600 mg p.o. b.i.d.  3. Atrovent MDI 2 puffs q.i.d.  4. Claritin 10 mg 1 tablet p.o. daily.  5. Singulair 10 mg p.o. daily.  6. Moxifloxacin 400 mg p.o. daily.  7. Diflucan 150 mg p.o. daily.   She will be told to call her primary care doctor within 1-2 weeks for  follow-up appointment and to take all medications as prescribed.      Michaelyn Barter, M.D.  Electronically Signed     OR/MEDQ  D:  08/27/2006  T:  08/27/2006  Job:  [id number]

## 2010-11-28 NOTE — Discharge Summary (Signed)
[name], [name]              ACCOUNT NO.:  [id number]   MEDICAL RECORD NO.:  [id number]          PATIENT TYPE:  INP   LOCATION:  1403                         FACILITY:  [facility]   PHYSICIAN:  Kela Millin, M.D.DATE OF BIRTH:  1976-06-28   DATE OF ADMISSION:  12/17/2005  DATE OF DISCHARGE:  12/21/2005                           DISCHARGE SUMMARY - REFERRING   DISCHARGE DIAGNOSES:  1.  Asthma exacerbation.  2.  Hypokalemia - resolved.  3.  Obesity.   HISTORY:  The patient is a 35 year old black female with history of asthma  who presented with complaints of shortness of breath, wheezing and a cough  consistent with her typical asthma symptoms.  The patient reported that  symptoms had been worsening over a week, and her cough occasionally  productive and she was using her inhalers more than usual.  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  In the ER,  patient was afebrile but noted to be wheezing significantly.  Chest x-ray  revealed peribronchial thickening without infiltrate.  She was admitted to  the [facility] for further evaluation and management.   PHYSICAL EXAMINATION ON ADMISSION:  As per Dr. Nehemiah Settle showed a temperature  97.9 with a blood pressure of 172/104, pulse 93, respiratory rate of 26, O2  saturation 99%.  Pertinent findings on exam were on her chest, bilateral  expiratory wheezes.  The rest of her physical examination was noted to be  within normal limits.   LABORATORY DATA:  Her chest x-ray showed peribronchial thickening with no  infiltrate.   Her sodium was 136, potassium 4, chloride 109, CO2 23, glucose 132, BUN 6,  creatinine 0.6, calcium 9.1.   HOSPITAL COURSE:  PROBLEM #1 -  ASTHMA  EXACERBATION:  Upon admission,  patient was started on IV antibiotics empirically as well as nebulized  bronchodilators and IV steroids.  Sputum cultures were ordered  but a sample  was never obtained because patient was not able to cough up any sputum.  The  patient's shortness of breath as well as cough gradually improved and the  patient started ambulating without difficulty.  She has remained afebrile  and hemodynamically stable and also she is oxygenating well on room air -  her O2 saturation today prior to discharge is 97% on room air.  The patient  will be discharged on antibiotics and a taper of prednisone and Advair was  added during her hospital stay which she is to continue as well as her  Singulair and Zyrtec.   PROBLEM #2 -  HYPOKALEMIA:  Her potassium was replaced while in the  hospital.  Her potassium today prior to discharge is 4.   PROBLEM #3 -  ELEVATED BLOOD PRESSURES:  The impression was that this was  secondary to the IV steroids which she was receiving during her hospital  stay.  Patient has no prior history of hypertension.  She was placed on  p.r.n. clonidine while in the hospital.  She is to follow up with her  primary care physician at [facility] for monitoring of her blood pressures  and antihypertensives to be started on an outpatient basis if, on subsequent  monitoring, her blood pressures are noted to be persistently elevated off of  the steroids.  Patient remained chest pain-free during her hospital stay.   DISCHARGE MEDICATIONS:  1.  Avelox 400 mg p.o. daily.  2.  Prednisone taper as directed.  3.  Advair 200/50 one puff b.i.d.  4.  Patient should continue preadmission medications.   FOLLOW UP CARE:  Patient to follow up at [facility] in the next five to  seven days.   DISCHARGE DIET:  A 2 g sodium diet.   CONDITION ON DISCHARGE:  Improved/stable.      Kela Millin, M.D.  Electronically Signed     ACV/MEDQ  D:  12/21/2005  T:   12/21/2005  Job:  [id number]   cc:   Lonni Fix, M.D.  [facility]

## 2010-12-01 IMAGING — US US OB COMP LESS 14 WK
1 series · 14 of 28 positions shown · non-contrast
Comparison: none

CLINICAL DATA: Lower abdominal pain.  Known ectopic pregnancy.
History of methotrexate.

OBSTETRIC <14 WK US AND TRANSVAGINAL OB US
TECHNIQUE: Both transabdominal and transvaginal ultrasound
examinations were performed for complete evaluation of the
gestation as well as the maternal uterus, adnexal regions, and
pelvic cul-de-sac.

[Series 1: us ob comp less 14 wks · 0.24mm/px · 14 of 50 slices shown]
[im 2/50]
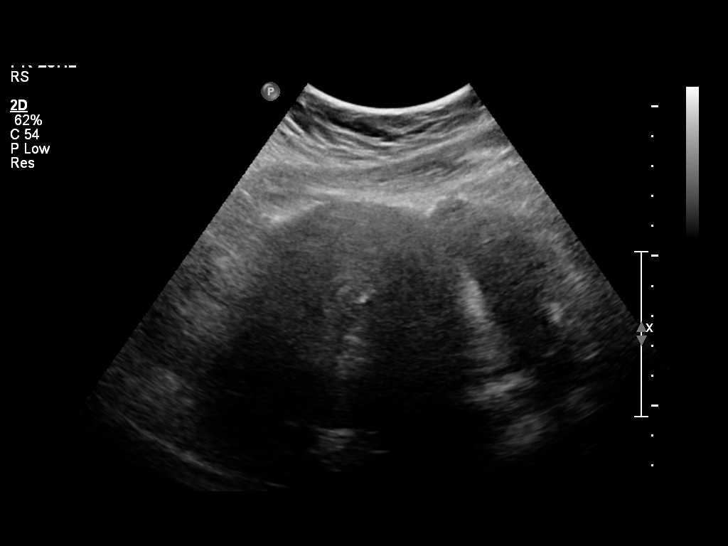
[im 6/50]
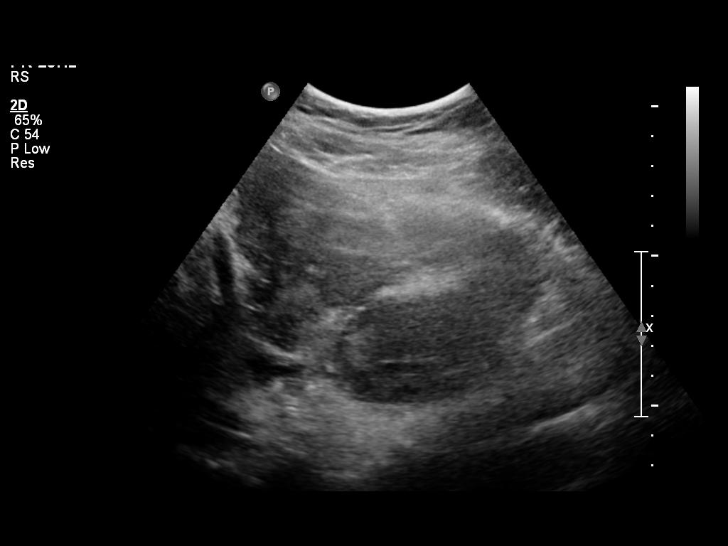
[im 10/50]
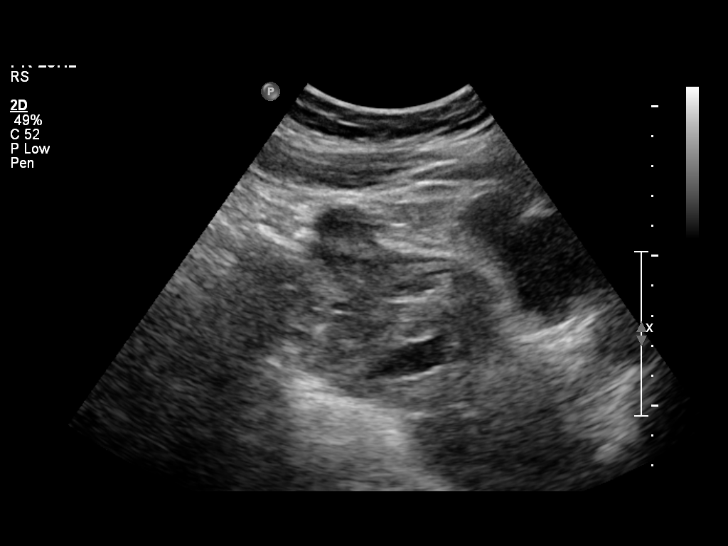
[im 13/50]
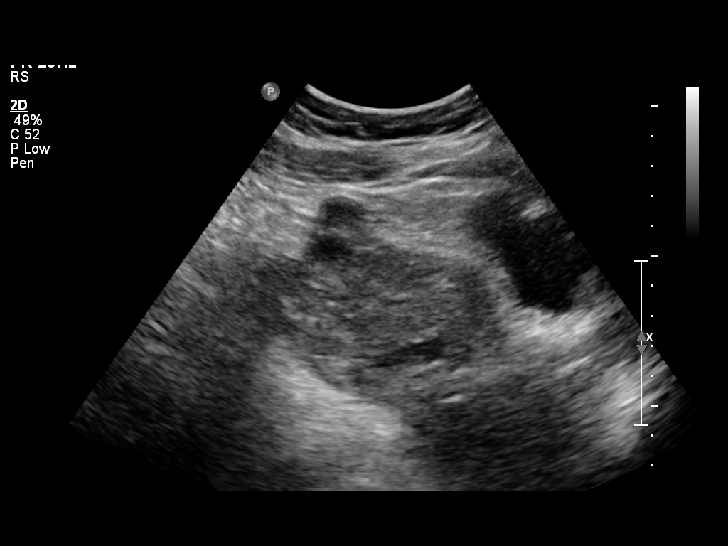
[im 17/50]
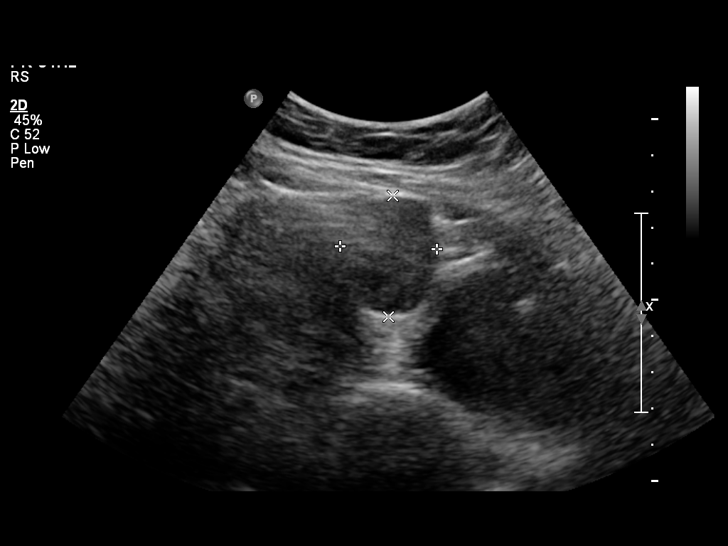
[im 20/50]
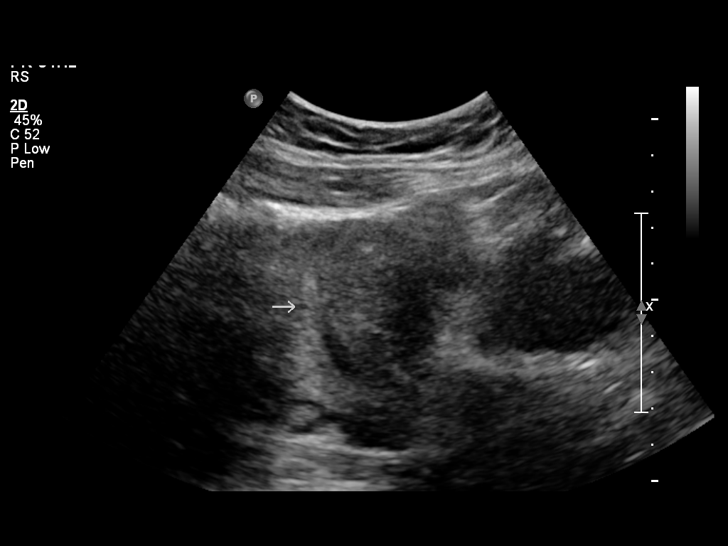
[im 24/50]
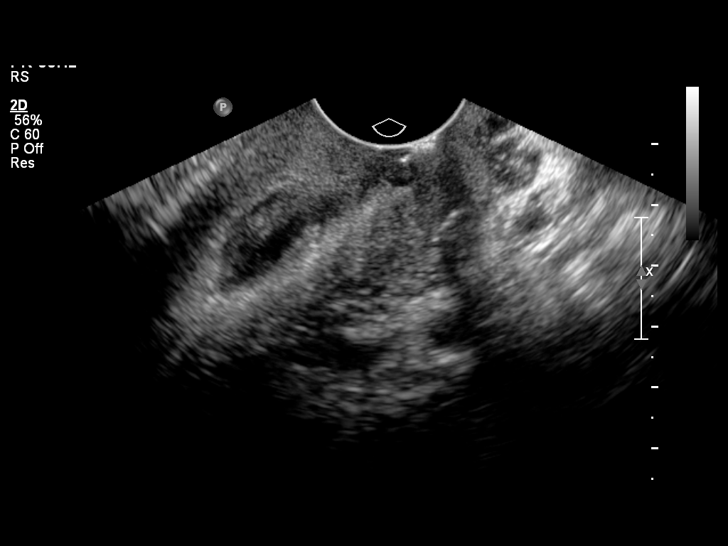
[im 28/50]
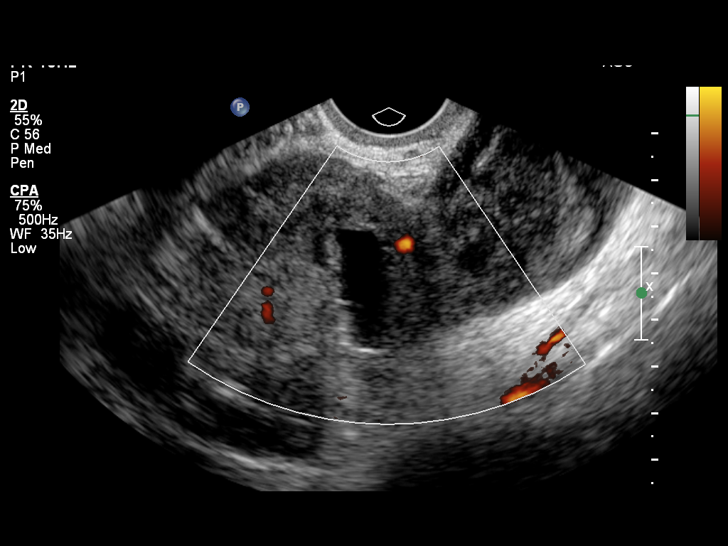
[im 31/50]
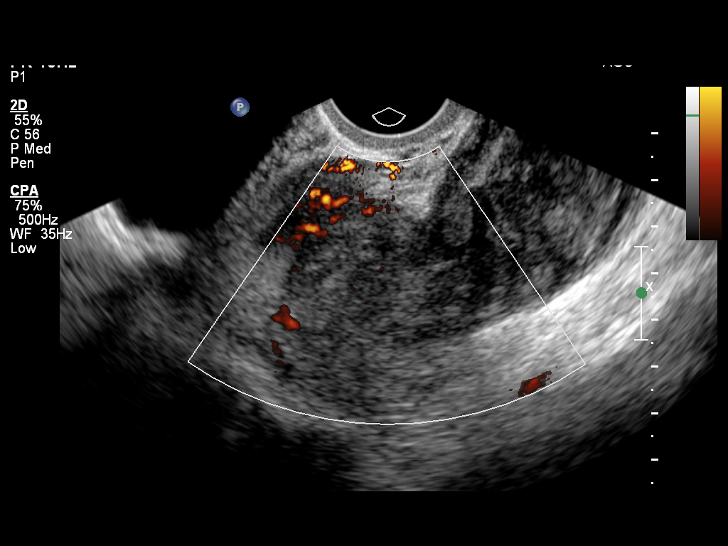
[im 35/50]
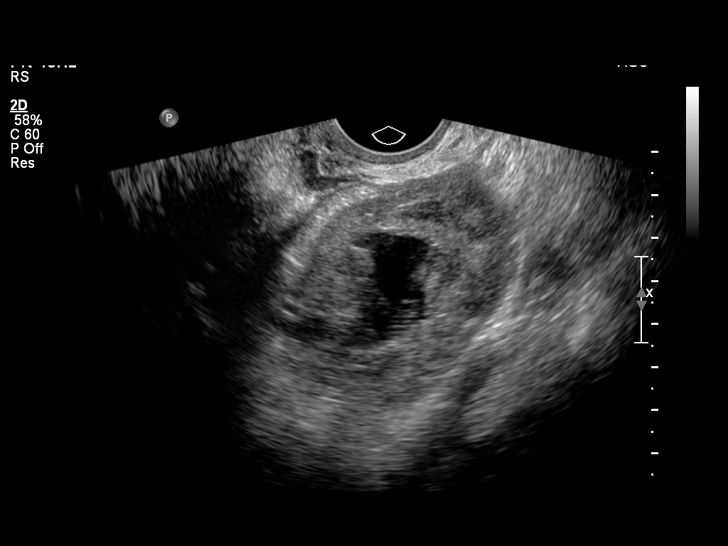
[im 39/50]
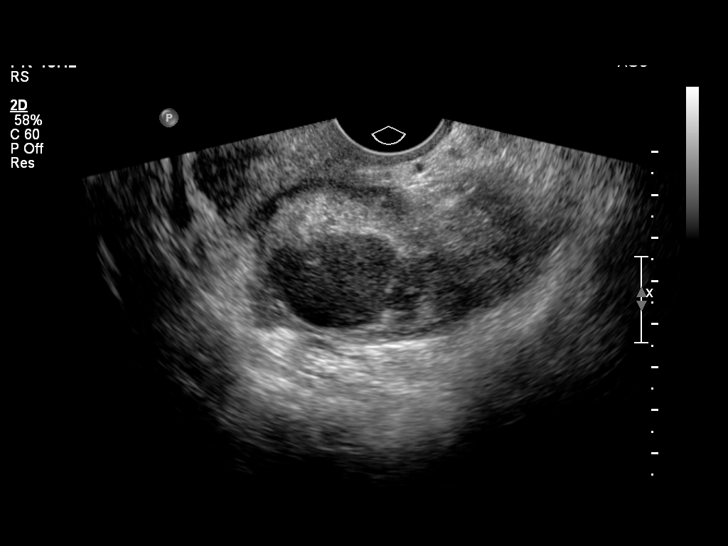
[im 42/50]
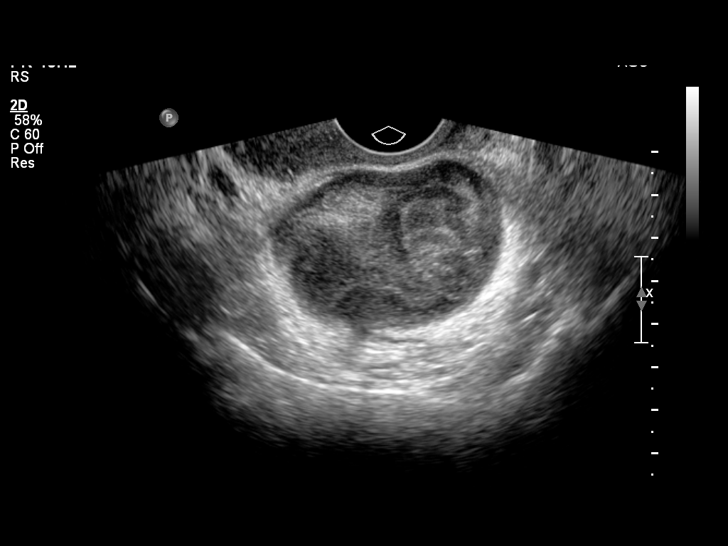
[im 46/50]
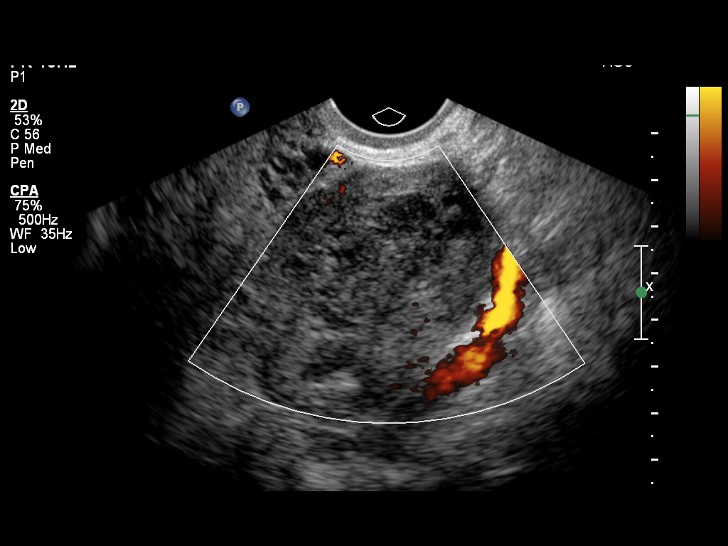
[im 50/50]
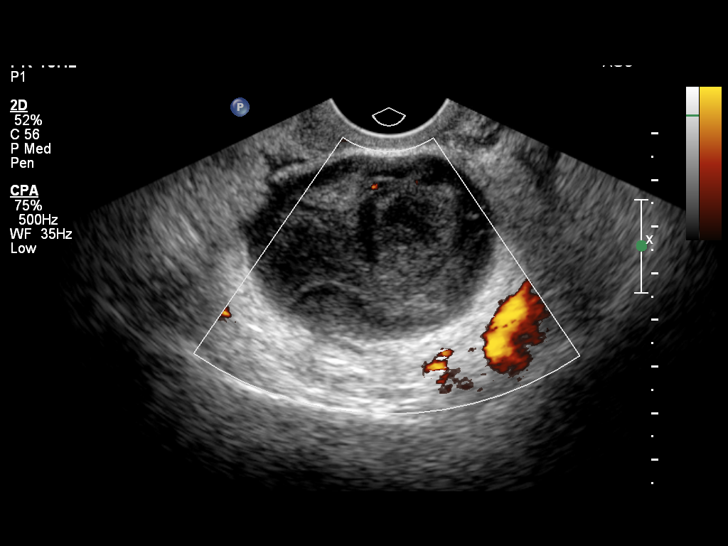

[14 of 28 positions shown; findings below may reference images not displayed]

FINDINGS: No intrauterine gestational sac is identified.  The study was
technically difficult due to patient body habitus.  Neither ovary
was visualized.

10.9 x 5.9 x 5.1 cm mass is present in the left adnexa.  This is
heterogeneous and complex with areas of increased and decreased
echogenicity.  Decreased echogenicity is present centrally within
the mass.  Small foci of power Doppler flow are noted.  No fetal
pole is identified within the mass.

Multiple uterine fibroids are identified.

No free fluid in the anatomic pelvis.
IMPRESSION: 10.9 x 5.9 x 5.1 cm heterogeneous mass in the left adnexa.  In the
setting of ectopic pregnancy, this probably represents hemorrhage
associated with prior ectopic pregnancy.  Chronic ectopic pregnancy
is possible.

Fibroid uterus.

## 2010-12-09 ENCOUNTER — Inpatient Hospital Stay (HOSPITAL_COMMUNITY)
Admission: EM | Admit: 2010-12-09 | Discharge: 2010-12-10 | Disposition: A | Discharge status: Home / Self Care | Payer: Medicare Other | Source: Ambulatory Visit | Attending: Obstetrics and Gynecology | Admitting: Obstetrics and Gynecology

## 2010-12-09 DIAGNOSIS — K8020 Calculus of gallbladder without cholecystitis without obstruction: Secondary | ICD-10-CM | POA: Insufficient documentation

## 2010-12-09 DIAGNOSIS — R1013 Epigastric pain: Secondary | ICD-10-CM | POA: Insufficient documentation

## 2010-12-10 ENCOUNTER — Inpatient Hospital Stay (HOSPITAL_COMMUNITY): Payer: Medicare Other

## 2010-12-10 LAB — URINALYSIS, ROUTINE W REFLEX MICROSCOPIC
Glucose, UA: NEGATIVE mg/dL
Ketones, ur: NEGATIVE mg/dL
Leukocytes, UA: NEGATIVE
Nitrite: NEGATIVE
Protein, ur: NEGATIVE mg/dL
pH: 5.5 (ref 5.0–8.0)

## 2010-12-10 LAB — COMPREHENSIVE METABOLIC PANEL
CO2: 23 mEq/L (ref 19–32)
Calcium: 8.8 mg/dL (ref 8.4–10.5)
Creatinine, Ser: 0.72 mg/dL (ref 0.4–1.2)
GFR calc non Af Amer: >60 mL/min (ref >60)
Glucose, Bld: 97 mg/dL (ref 70–99)
Sodium: 137 mEq/L (ref 135–145)
Total Protein: 7.4 g/dL (ref 6.0–8.3)

## 2010-12-10 LAB — AMYLASE: Amylase: 122 U/L — ABNORMAL HIGH (ref 0–105)

## 2010-12-10 LAB — DIFFERENTIAL
Basophils Absolute: 0.1 10*3/uL (ref 0.0–0.1)
Basophils Relative: 1 % (ref 0–1)
Lymphocytes Relative: 15 % (ref 12–46)
Neutro Abs: 10.6 10*3/uL — ABNORMAL HIGH (ref 1.7–7.7)
Neutrophils Relative %: 73 % (ref 43–77)

## 2010-12-10 LAB — URINE MICROSCOPIC-ADD ON

## 2010-12-10 LAB — POCT PREGNANCY, URINE: Preg Test, Ur: NEGATIVE

## 2010-12-10 LAB — CBC
HCT: 28.4 % — ABNORMAL LOW (ref 36.0–46.0)
Hemoglobin: 7.8 g/dL — ABNORMAL LOW (ref 12.0–15.0)
MCH: 16.1 pg — ABNORMAL LOW (ref 26.0–34.0)
MCHC: 27.5 g/dL — ABNORMAL LOW (ref 30.0–36.0)
RDW: 20.3 % — ABNORMAL HIGH (ref 11.5–15.5)

## 2010-12-10 LAB — LIPASE, BLOOD: Lipase: 27 U/L (ref 11–59)

## 2011-02-28 ENCOUNTER — Emergency Department (HOSPITAL_COMMUNITY)
Admission: EM | Admit: 2011-02-28 | Discharge: 2011-03-01 | Disposition: A | Discharge status: Home / Self Care | Payer: Medicare Other | Attending: Emergency Medicine | Admitting: Emergency Medicine

## 2011-02-28 DIAGNOSIS — A499 Bacterial infection, unspecified: Secondary | ICD-10-CM | POA: Insufficient documentation

## 2011-02-28 DIAGNOSIS — B3731 Acute candidiasis of vulva and vagina: Secondary | ICD-10-CM | POA: Insufficient documentation

## 2011-02-28 DIAGNOSIS — N760 Acute vaginitis: Secondary | ICD-10-CM | POA: Insufficient documentation

## 2011-02-28 DIAGNOSIS — B9689 Other specified bacterial agents as the cause of diseases classified elsewhere: Secondary | ICD-10-CM | POA: Insufficient documentation

## 2011-02-28 DIAGNOSIS — N390 Urinary tract infection, site not specified: Secondary | ICD-10-CM | POA: Insufficient documentation

## 2011-02-28 DIAGNOSIS — B373 Candidiasis of vulva and vagina: Secondary | ICD-10-CM | POA: Insufficient documentation

## 2011-03-01 LAB — URINALYSIS, ROUTINE W REFLEX MICROSCOPIC
Glucose, UA: NEGATIVE mg/dL
Protein, ur: NEGATIVE mg/dL
Urobilinogen, UA: 0.2 mg/dL (ref 0.0–1.0)

## 2011-03-01 LAB — POCT PREGNANCY, URINE: Preg Test, Ur: NEGATIVE

## 2011-03-01 LAB — WET PREP, GENITAL

## 2011-03-01 LAB — URINE MICROSCOPIC-ADD ON

## 2011-03-02 LAB — GC/CHLAMYDIA PROBE AMP, GENITAL: GC Probe Amp, Genital: NEGATIVE

## 2011-04-02 LAB — WET PREP, GENITAL

## 2011-04-02 LAB — GC/CHLAMYDIA PROBE AMP, GENITAL: GC Probe Amp, Genital: NEGATIVE

## 2011-05-08 ENCOUNTER — Inpatient Hospital Stay (INDEPENDENT_AMBULATORY_CARE_PROVIDER_SITE_OTHER)
Admission: RE | Admit: 2011-05-08 | Discharge: 2011-05-08 | Disposition: A | Discharge status: Home / Self Care | Payer: Medicare Other | Source: Ambulatory Visit | Attending: Family Medicine | Admitting: Family Medicine

## 2011-05-08 DIAGNOSIS — N760 Acute vaginitis: Secondary | ICD-10-CM

## 2011-05-08 LAB — WET PREP, GENITAL: Trich, Wet Prep: NONE SEEN

## 2011-05-26 ENCOUNTER — Encounter: Payer: Self-pay | Admitting: *Deleted

## 2011-05-26 ENCOUNTER — Emergency Department (HOSPITAL_COMMUNITY)
Admission: EM | Admit: 2011-05-26 | Discharge: 2011-05-26 | Disposition: A | Discharge status: Home / Self Care | Payer: Medicare Other | Attending: Emergency Medicine | Admitting: Emergency Medicine

## 2011-05-26 ENCOUNTER — Emergency Department (HOSPITAL_COMMUNITY): Payer: Medicare Other

## 2011-05-26 DIAGNOSIS — J40 Bronchitis, not specified as acute or chronic: Secondary | ICD-10-CM | POA: Insufficient documentation

## 2011-05-26 DIAGNOSIS — J45901 Unspecified asthma with (acute) exacerbation: Secondary | ICD-10-CM | POA: Insufficient documentation

## 2011-05-26 DIAGNOSIS — R0789 Other chest pain: Secondary | ICD-10-CM | POA: Insufficient documentation

## 2011-05-26 DIAGNOSIS — R0682 Tachypnea, not elsewhere classified: Secondary | ICD-10-CM | POA: Insufficient documentation

## 2011-05-26 HISTORY — DX: Benign neoplasm of connective and other soft tissue, unspecified: D21.9

## 2011-05-26 HISTORY — DX: Other allergy status, other than to drugs and biological substances: Z91.09

## 2011-05-26 MED ORDER — IPRATROPIUM BROMIDE 0.02 % IN SOLN
0.5000 mg | Freq: Once | RESPIRATORY_TRACT | Status: AC
  Administered 2011-05-26: 0.5 mg via RESPIRATORY_TRACT
  Filled 2011-05-26: qty 2.5

## 2011-05-26 MED ORDER — ALBUTEROL SULFATE HFA 108 (90 BASE) MCG/ACT IN AERS: 1.0000 | INHALATION_SPRAY | Freq: Four times a day (QID) | RESPIRATORY_TRACT | Status: DC | PRN

## 2011-05-26 MED ORDER — AZITHROMYCIN 250 MG PO TABS: 250.0000 mg | ORAL_TABLET | Freq: Every day | ORAL | Status: AC

## 2011-05-26 MED ORDER — DEXTROMETHORPHAN-GUAIFENESIN 10-100 MG/5ML PO LIQD: 5.0000 mL | ORAL | Status: AC | PRN

## 2011-05-26 MED ORDER — ALBUTEROL SULFATE (5 MG/ML) 0.5% IN NEBU
5.0000 mg | INHALATION_SOLUTION | Freq: Once | RESPIRATORY_TRACT | Status: AC
  Administered 2011-05-26: 5 mg via RESPIRATORY_TRACT
  Filled 2011-05-26: qty 1

## 2011-05-26 MED ORDER — ALBUTEROL SULFATE (5 MG/ML) 0.5% IN NEBU
5.0000 mg | INHALATION_SOLUTION | Freq: Once | RESPIRATORY_TRACT | Status: AC
  Administered 2011-05-26: 2.5 mg via RESPIRATORY_TRACT
  Filled 2011-05-26: qty 1

## 2011-05-26 MED ORDER — ACETAMINOPHEN 325 MG PO TABS
650.0000 mg | ORAL_TABLET | Freq: Once | ORAL | Status: AC
  Administered 2011-05-26: 650 mg via ORAL
  Filled 2011-05-26: qty 2

## 2011-05-26 NOTE — ED Notes (Signed)
Heather, PA, in w/pt advising of tx plan.  Pt's brother at bedside.

## 2011-05-26 NOTE — ED Notes (Signed)
Pt c/o prod cough - white/yellow w/body aches x 1 week.  States has hx asthma.

## 2011-05-26 NOTE — ED Provider Notes (Signed)
History     CSN: [id number] Arrival date & time: 05/26/2011 10:10 AM   First MD Initiated Contact with Patient 05/26/11 1027      Chief Complaint  Patient presents with  . Shortness of Breath    Pt c/o shortness of breath, prod cough -  white/yellow and body aches x 1 week.    (Consider location/radiation/quality/duration/timing/severity/associated sxs/prior treatment) HPI Comments: Patient reports that she has had a productive cough with yellow colored sputum for the past week.  She has been feeling more short of breath over the last three days.  She reports that she frequently has asthma exacerbations with changes in the weather.  Patient is currently taking Simbicort and Singulair for her asthma.  She does not have an albuterol rescue inhaler.  She reports that she has been admitted to the hospital for asthma in the past with the last admission a few years ago.  She has never been intubated.  She reports that she does not smoke.  Patient is a 35 y.o. female presenting with shortness of breath. The history is provided by the patient.  Shortness of Breath  The current episode started 3 to 5 days ago. The onset was gradual. Associated symptoms include cough, shortness of breath and wheezing. Pertinent negatives include no chest pain, no orthopnea, no fever, no rhinorrhea and no sore throat. She has had prior hospitalizations. She has had no prior ICU admissions. She has had no prior intubations. Her past medical history is significant for asthma. There were no sick contacts.    Past Medical History  Diagnosis Date  . Asthma   . Environmental allergies   . Fibroids     Past Surgical History  Procedure Date  . Ectopic pregnancy surgery   . Uterine fibroid surgery     History reviewed. No pertinent family history.  History  Substance Use Topics  . Smoking status: Former Games developer  . Smokeless tobacco: Not on file  . Alcohol Use: No    OB History    Grav Para Term Preterm  Abortions TAB SAB Ect Mult Living                  Review of Systems  Constitutional: Negative for fever, chills, diaphoresis and activity change.  HENT: Positive for congestion. Negative for ear pain, sore throat, rhinorrhea, neck pain, neck stiffness and sinus pressure.   Respiratory: Positive for cough, chest tightness, shortness of breath and wheezing.   Cardiovascular: Negative for chest pain, palpitations, orthopnea and leg swelling.  Gastrointestinal: Negative for abdominal pain.  Skin: Negative for color change and rash.  Neurological: Negative for dizziness, syncope, light-headedness and headaches.    Allergies  Bee venom and Prednisone  Home Medications   Current Outpatient Rx  Name Route Sig Dispense Refill  . BUDESONIDE-FORMOTEROL FUMARATE 160-4.5 MCG/ACT IN AERO Inhalation Inhale 2 puffs into the lungs 2 (two) times daily.      Marland Kitchen MONTELUKAST SODIUM 10 MG PO TABS Oral Take 10 mg by mouth at bedtime.      Marland Kitchen RANITIDINE HCL 75 MG PO TABS Oral Take 75-150 mg by mouth 2 (two) times daily as needed. For heartburn     . ALBUTEROL SULFATE HFA 108 (90 BASE) MCG/ACT IN AERS Inhalation Inhale 1-2 puffs into the lungs every 6 (six) hours as needed for wheezing. 1 Inhaler 0  . AZITHROMYCIN 250 MG PO TABS Oral Take 1 tablet (250 mg total) by mouth daily. Take first 2 tablets together, then 1  every day until finished. 6 tablet 0  . DEXTROMETHORPHAN-GUAIFENESIN 10-100 MG/5ML PO LIQD Oral Take 5 mLs by mouth every 4 (four) hours as needed for cough. 30 mL 0    BP 137/78  Pulse 110  Temp(Src) 98.2 F (36.8 C) (Oral)  Resp 22  Ht 5\' 2"  (1.575 m)  Wt 260 lb (117.935 kg)  BMI 47.55 kg/m2  SpO2 99%  LMP 04/22/2011  Physical Exam  Vitals reviewed. Constitutional: She is oriented to person, place, and time. She appears well-developed and well-nourished. No distress.  HENT:  Head: Normocephalic and atraumatic.  Right Ear: Tympanic membrane and external ear normal.  Left Ear:  Tympanic membrane and external ear normal.  Nose: Nose normal.  Mouth/Throat: Oropharynx is clear and moist.  Neck: Normal range of motion.  Cardiovascular: Normal rate, regular rhythm and normal heart sounds.   Pulmonary/Chest: No accessory muscle usage. Tachypnea noted. She has wheezes. She has rhonchi. She has no rales. She exhibits no tenderness.  Neurological: She is alert and oriented to person, place, and time.  Skin: Skin is warm and dry. She is not diaphoretic.    ED Course  Procedures (including critical care time)  Labs Reviewed - No data to display Dg Chest 2 View  05/26/2011  *RADIOLOGY REPORT*  Clinical Data: This of breath, productive cough  CHEST - 2 VIEW  Comparison: Chest x-ray of 07/07/2010  Findings: No pneumonia is seen.  There is peribronchial thickening which may indicate bronchitis.  Mediastinal contours are stable. The heart is within normal limits in size.  No bony abnormality is seen.  IMPRESSION:  No pneumonia.  Probable bronchitis.  Original Report Authenticated By: Juline Patch, M.D.     1. Asthma exacerbation   2. Bronchitis     Reassessed patient.  Patient reports that she is feeling less short of breath than she was initially.  She continues to have diffuse wheezing.  Will order another breathing treatment. Reassessed patient after second breathing treatment.  Patient reports that she is feeling somewhat better.  Diffuse wheezing heard on exam.  However, wheezing has improved.  Will order another treatment. Reassessed patient after third breathing treatment.  Patient reports that she is feeling much better than she was when she came in.  Mild diffuse wheezing heard on exam.      MDM  Chest xray showing bronchitis.  Due to the fact patient has had a productive cough for the last week which is becoming progressively worse and a history of asthma.  Will treat bronchitis with antibiotic therapy.  Also gave patient a prescription for albuterol inhaler that  patient can use as a rescue inhaler because she does not have this at home.        Pascal Lux [facility] 05/26/11 1903

## 2011-05-26 NOTE — ED Notes (Signed)
Pt states is breathing some better but wheezing remains.  Heather, PA, aware and is ordering 2nd HHN - [name], RT, aware.

## 2011-05-26 NOTE — ED Notes (Signed)
Taken to xray via w/c

## 2011-05-26 NOTE — ED Notes (Signed)
Decaf coffee given to pt as requested and verified with Herbert Seta, PA.  [name], RT, aware of new order for HHN.  Pt states breathing some better.  Wheezing remains.

## 2011-05-26 NOTE — ED Notes (Signed)
[name], RT, w/pt

## 2011-05-26 NOTE — ED Notes (Signed)
Pt sitting quietly on stretcher.  [name], RT, aware of need for HHN.

## 2011-05-28 NOTE — ED Provider Notes (Signed)
Medical screening examination/treatment/procedure(s) were performed by non-physician practitioner and as supervising physician I was immediately available for consultation/collaboration.  Tahirih Lair T Malgorzata Albert, MD 05/28/11 2315 

## 2011-06-26 ENCOUNTER — Emergency Department (HOSPITAL_COMMUNITY): Payer: Medicare Other

## 2011-06-26 ENCOUNTER — Inpatient Hospital Stay (HOSPITAL_COMMUNITY)
Admission: EM | Admit: 2011-06-26 | Discharge: 2011-07-04 | DRG: 418 | Disposition: A | Discharge status: Home / Self Care | Payer: Medicare Other | Attending: General Surgery | Admitting: General Surgery

## 2011-06-26 ENCOUNTER — Encounter (HOSPITAL_COMMUNITY): Payer: Self-pay | Admitting: Emergency Medicine

## 2011-06-26 DIAGNOSIS — Z79899 Other long term (current) drug therapy: Secondary | ICD-10-CM

## 2011-06-26 DIAGNOSIS — R112 Nausea with vomiting, unspecified: Secondary | ICD-10-CM

## 2011-06-26 DIAGNOSIS — D509 Iron deficiency anemia, unspecified: Secondary | ICD-10-CM | POA: Diagnosis present

## 2011-06-26 DIAGNOSIS — K8062 Calculus of gallbladder and bile duct with acute cholecystitis without obstruction: Principal | ICD-10-CM | POA: Diagnosis present

## 2011-06-26 DIAGNOSIS — D72829 Elevated white blood cell count, unspecified: Secondary | ICD-10-CM | POA: Diagnosis not present

## 2011-06-26 DIAGNOSIS — K8020 Calculus of gallbladder without cholecystitis without obstruction: Secondary | ICD-10-CM

## 2011-06-26 DIAGNOSIS — Z6841 Body Mass Index (BMI) 40.0 and over, adult: Secondary | ICD-10-CM

## 2011-06-26 DIAGNOSIS — J069 Acute upper respiratory infection, unspecified: Secondary | ICD-10-CM

## 2011-06-26 DIAGNOSIS — D259 Leiomyoma of uterus, unspecified: Secondary | ICD-10-CM | POA: Diagnosis present

## 2011-06-26 DIAGNOSIS — J040 Acute laryngitis: Secondary | ICD-10-CM

## 2011-06-26 DIAGNOSIS — K819 Cholecystitis, unspecified: Secondary | ICD-10-CM

## 2011-06-26 DIAGNOSIS — T380X5A Adverse effect of glucocorticoids and synthetic analogues, initial encounter: Secondary | ICD-10-CM | POA: Diagnosis not present

## 2011-06-26 DIAGNOSIS — J45901 Unspecified asthma with (acute) exacerbation: Secondary | ICD-10-CM | POA: Diagnosis present

## 2011-06-26 DIAGNOSIS — R1011 Right upper quadrant pain: Secondary | ICD-10-CM

## 2011-06-26 HISTORY — DX: Gastro-esophageal reflux disease without esophagitis: K21.9

## 2011-06-26 HISTORY — DX: Anemia, unspecified: D64.9

## 2011-06-26 HISTORY — DX: Shortness of breath: R06.02

## 2011-06-26 LAB — URINALYSIS, MICROSCOPIC ONLY
Bilirubin Urine: NEGATIVE
Glucose, UA: NEGATIVE mg/dL
Hgb urine dipstick: NEGATIVE
Ketones, ur: NEGATIVE mg/dL
Leukocytes, UA: NEGATIVE
Nitrite: NEGATIVE
Protein, ur: NEGATIVE mg/dL
Specific Gravity, Urine: 1.018 (ref 1.005–1.030)
Urobilinogen, UA: 0.2 mg/dL (ref 0.0–1.0)
pH: 8.5 — ABNORMAL HIGH (ref 5.0–8.0)

## 2011-06-26 LAB — DIFFERENTIAL
Basophils Absolute: 0.1 K/uL (ref 0.0–0.1)
Basophils Relative: 1 % (ref 0–1)
Eosinophils Absolute: 0.6 10*3/uL (ref 0.0–0.7)
Eosinophils Relative: 5 % (ref 0–5)
Lymphocytes Relative: 19 % (ref 12–46)
Lymphs Abs: 2.3 K/uL (ref 0.7–4.0)
Monocytes Absolute: 0.6 K/uL (ref 0.1–1.0)
Monocytes Relative: 5 % (ref 3–12)
Neutro Abs: 8.4 K/uL — ABNORMAL HIGH (ref 1.7–7.7)
Neutrophils Relative %: 70 % (ref 43–77)

## 2011-06-26 LAB — CBC
HCT: 29.5 % — ABNORMAL LOW (ref 36.0–46.0)
Hemoglobin: 8.2 g/dL — ABNORMAL LOW (ref 12.0–15.0)
MCH: 16.1 pg — ABNORMAL LOW (ref 26.0–34.0)
MCHC: 27.8 g/dL — ABNORMAL LOW (ref 30.0–36.0)
MCV: 57.8 fL — ABNORMAL LOW (ref 78.0–100.0)
Platelets: 446 K/uL — ABNORMAL HIGH (ref 150–400)
RBC: 5.1 MIL/uL (ref 3.87–5.11)
RDW: 20 % — ABNORMAL HIGH (ref 11.5–15.5)
WBC: 12 10*3/uL — ABNORMAL HIGH (ref 4.0–10.5)

## 2011-06-26 LAB — HEPATIC FUNCTION PANEL
ALT: 36 U/L — ABNORMAL HIGH (ref 0–35)
AST: 77 U/L — ABNORMAL HIGH (ref 0–37)
Albumin: 3.2 g/dL — ABNORMAL LOW (ref 3.5–5.2)
Alkaline Phosphatase: 78 U/L (ref 39–117)
Bilirubin, Direct: 0.1 mg/dL (ref 0.0–0.3)
Indirect Bilirubin: 0.2 mg/dL — ABNORMAL LOW (ref 0.3–0.9)
Total Bilirubin: 0.3 mg/dL (ref 0.3–1.2)
Total Protein: 7.9 g/dL (ref 6.0–8.3)

## 2011-06-26 LAB — BASIC METABOLIC PANEL
CO2: 26 mEq/L (ref 19–32)
GFR calc non Af Amer: 86 mL/min — ABNORMAL LOW (ref >90)
Glucose, Bld: 94 mg/dL (ref 70–99)
Potassium: 3.8 mEq/L (ref 3.5–5.1)
Sodium: 138 mEq/L (ref 135–145)

## 2011-06-26 LAB — PREGNANCY, URINE: Preg Test, Ur: NEGATIVE

## 2011-06-26 LAB — BASIC METABOLIC PANEL WITH GFR
BUN: 8 mg/dL (ref 6–23)
Calcium: 9.3 mg/dL (ref 8.4–10.5)
Chloride: 103 meq/L (ref 96–112)
Creatinine, Ser: 0.86 mg/dL (ref 0.50–1.10)
GFR calc Af Amer: >90 mL/min (ref >90)

## 2011-06-26 LAB — OCCULT BLOOD, POC DEVICE: Fecal Occult Bld: POSITIVE

## 2011-06-26 LAB — LIPASE, BLOOD: Lipase: 33 U/L (ref 11–59)

## 2011-06-26 MED ORDER — MONTELUKAST SODIUM 10 MG PO TABS
10.0000 mg | ORAL_TABLET | Freq: Once | ORAL | Status: AC
  Administered 2011-06-26: 10 mg via ORAL
  Filled 2011-06-26: qty 1

## 2011-06-26 MED ORDER — ACETAMINOPHEN 650 MG RE SUPP: 650.0000 mg | Freq: Four times a day (QID) | RECTAL | Status: DC | PRN

## 2011-06-26 MED ORDER — DIPHENHYDRAMINE HCL 12.5 MG/5ML PO ELIX
12.5000 mg | ORAL_SOLUTION | Freq: Four times a day (QID) | ORAL | Status: DC | PRN
  Filled 2011-06-26: qty 10

## 2011-06-26 MED ORDER — IOHEXOL 300 MG/ML  SOLN: 100.0000 mL | Freq: Once | INTRAMUSCULAR | Status: AC | PRN

## 2011-06-26 MED ORDER — GUAIFENESIN ER 600 MG PO TB12
600.0000 mg | ORAL_TABLET | Freq: Two times a day (BID) | ORAL | Status: DC
  Administered 2011-06-26 – 2011-07-04 (×14): 600 mg via ORAL
  Filled 2011-06-26 (×18): qty 1

## 2011-06-26 MED ORDER — ONDANSETRON HCL 4 MG/2ML IJ SOLN
INTRAMUSCULAR | Status: AC
  Administered 2011-06-26: 4 mg via INTRAVENOUS
  Filled 2011-06-26: qty 2

## 2011-06-26 MED ORDER — SODIUM CHLORIDE 0.9 % IV SOLN
INTRAVENOUS | Status: DC
  Administered 2011-06-26 (×2): via INTRAVENOUS

## 2011-06-26 MED ORDER — HYDROCODONE-ACETAMINOPHEN 5-325 MG PO TABS
1.0000 | ORAL_TABLET | ORAL | Status: DC | PRN
Start: 2011-06-26 — End: 2011-07-04
  Administered 2011-06-27 – 2011-07-02 (×8): 2 via ORAL
  Filled 2011-06-26 (×9): qty 2

## 2011-06-26 MED ORDER — IOHEXOL 300 MG/ML  SOLN
20.0000 mL | INTRAMUSCULAR | Status: AC
  Administered 2011-06-26: 20 mL via ORAL

## 2011-06-26 MED ORDER — BUDESONIDE-FORMOTEROL FUMARATE 160-4.5 MCG/ACT IN AERO
2.0000 | INHALATION_SPRAY | Freq: Two times a day (BID) | RESPIRATORY_TRACT | Status: DC
  Administered 2011-06-26 – 2011-07-04 (×15): 2 via RESPIRATORY_TRACT
  Filled 2011-06-26 (×2): qty 6

## 2011-06-26 MED ORDER — PANTOPRAZOLE SODIUM 40 MG IV SOLR
40.0000 mg | Freq: Every day | INTRAVENOUS | Status: DC
  Administered 2011-06-26 – 2011-06-27 (×2): 40 mg via INTRAVENOUS
  Filled 2011-06-26 (×3): qty 40

## 2011-06-26 MED ORDER — DIPHENHYDRAMINE HCL 50 MG/ML IJ SOLN: 12.5000 mg | Freq: Four times a day (QID) | INTRAMUSCULAR | Status: DC | PRN

## 2011-06-26 MED ORDER — METOCLOPRAMIDE HCL 5 MG/ML IJ SOLN
10.0000 mg | Freq: Once | INTRAMUSCULAR | Status: AC
  Administered 2011-06-26: 10 mg via INTRAVENOUS

## 2011-06-26 MED ORDER — MONTELUKAST SODIUM 10 MG PO TABS
10.0000 mg | ORAL_TABLET | Freq: Every day | ORAL | Status: DC
  Administered 2011-06-26 – 2011-07-03 (×8): 10 mg via ORAL
  Filled 2011-06-26 (×9): qty 1

## 2011-06-26 MED ORDER — HYDROMORPHONE HCL PF 1 MG/ML IJ SOLN
1.0000 mg | Freq: Once | INTRAMUSCULAR | Status: AC
  Administered 2011-06-26: 1 mg via INTRAVENOUS
  Filled 2011-06-26: qty 1

## 2011-06-26 MED ORDER — ALBUTEROL SULFATE HFA 108 (90 BASE) MCG/ACT IN AERS
1.0000 | INHALATION_SPRAY | Freq: Four times a day (QID) | RESPIRATORY_TRACT | Status: DC | PRN
  Administered 2011-06-26 – 2011-06-27 (×2): 2 via RESPIRATORY_TRACT
  Filled 2011-06-26: qty 6.7

## 2011-06-26 MED ORDER — ACETAMINOPHEN 325 MG PO TABS
650.0000 mg | ORAL_TABLET | Freq: Four times a day (QID) | ORAL | Status: DC | PRN
  Administered 2011-06-28: 650 mg via ORAL
  Filled 2011-06-26: qty 2

## 2011-06-26 MED ORDER — KCL IN DEXTROSE-NACL 20-5-0.45 MEQ/L-%-% IV SOLN
INTRAVENOUS | Status: DC
  Administered 2011-06-26: 23:00:00 via INTRAVENOUS
  Administered 2011-06-28: 20 mL via INTRAVENOUS
  Filled 2011-06-26 (×6): qty 1000

## 2011-06-26 MED ORDER — ONDANSETRON HCL 4 MG/2ML IJ SOLN
4.0000 mg | Freq: Once | INTRAMUSCULAR | Status: AC
  Administered 2011-06-26: 4 mg via INTRAVENOUS
  Filled 2011-06-26: qty 2

## 2011-06-26 MED ORDER — PROMETHAZINE HCL 25 MG/ML IJ SOLN
6.2500 mg | Freq: Four times a day (QID) | INTRAMUSCULAR | Status: DC | PRN
  Administered 2011-06-29 – 2011-07-04 (×2): 12.5 mg via INTRAVENOUS
  Filled 2011-06-26 (×2): qty 1

## 2011-06-26 MED ORDER — GUAIFENESIN ER 600 MG PO TB12
600.0000 mg | ORAL_TABLET | Freq: Once | ORAL | Status: AC
  Administered 2011-06-26: 600 mg via ORAL
  Filled 2011-06-26: qty 1

## 2011-06-26 MED ORDER — HYDROMORPHONE HCL PF 1 MG/ML IJ SOLN
0.5000 mg | INTRAMUSCULAR | Status: DC | PRN
  Administered 2011-06-28 – 2011-06-30 (×7): 1 mg via INTRAVENOUS
  Administered 2011-06-30 – 2011-07-03 (×4): 0.5 mg via INTRAVENOUS
  Administered 2011-07-03 – 2011-07-04 (×4): 1 mg via INTRAVENOUS
  Filled 2011-06-26 (×15): qty 1

## 2011-06-26 MED ORDER — ONDANSETRON HCL 4 MG/2ML IJ SOLN
4.0000 mg | Freq: Once | INTRAMUSCULAR | Status: AC
  Administered 2011-06-26: 4 mg via INTRAVENOUS

## 2011-06-26 MED ORDER — PIPERACILLIN-TAZOBACTAM 3.375 G IVPB
3.3750 g | Freq: Three times a day (TID) | INTRAVENOUS | Status: DC
Start: 2011-06-26 — End: 2011-07-04
  Administered 2011-06-26 – 2011-07-04 (×21): 3.375 g via INTRAVENOUS
  Filled 2011-06-26 (×26): qty 50

## 2011-06-26 MED ORDER — METOCLOPRAMIDE HCL 5 MG/ML IJ SOLN
INTRAMUSCULAR | Status: AC
  Filled 2011-06-26: qty 2

## 2011-06-26 NOTE — ED Notes (Signed)
Pt ambulatory to BR without difficulty.

## 2011-06-26 NOTE — ED Provider Notes (Signed)
Medical screening examination/treatment/procedure(s) were performed by non-physician practitioner and as supervising physician I was immediately available for consultation/collaboration.    Celene Kras, MD 06/26/11 1540

## 2011-06-26 NOTE — ED Notes (Signed)
Ice chips provided per MD permission.  Awaiting surgery consult.

## 2011-06-26 NOTE — ED Provider Notes (Signed)
  Physical Exam  BP 102/39  Pulse 81  Temp(Src) 97.7 F (36.5 C) (Oral)  Resp 18  SpO2 96%  Physical Exam 7:36 AM VSS, NAD, WDWN Heart: RRR, NML S1 and S2 Lungs: wheezing which clears after coughing. Otherwise CTA bilaterally Abdomen: Obese, NML bowel sounds, NTND ED Course  Procedures    MDM 7:32 AM Patient has finished drinking contrast and is pending a CT scan of her abdomen. Reports feeling a little nauseous after drinking contrast. Sitting up in bed with emesis bag. Reports nausea and vomiting times one week. States yesterday began having diffuse abdominal pain. Reports upper respiratory symptoms as well. Currently pain is controlled.   10:38 AM Spoke with [facility] regarding patient's CT results. Will, PA-C. will be down to see the patient as soon as he is able. Recommends an ultrasound of the abdomen, chest x-ray, and a Hemoccult.  2:06 PM Surgery has been into see the patient. Waiting to hear back from Dr. Corliss Skains to see if patient should be a medical admit with plans to remove the gallbladder in the future versus a surgical admit.  2:55 PM Hemoccult is positive but results are actually indeterminate. Patient is menstruating. Reports she has a history of anemia. And is not treated for this anemia. Prior hemoglobins have been as low as 7.2.     Thomasene Lot, [place] 06/26/11 1457   3:30 PM Spoke with surgery PA, Will, states he still waiting for Dr.Tsuei see the patient. States he feels that likely will have gallbladder removed since patient is still having pain. However he continues to debate whether patient should be admitted to general medicine will sign patient to Dr. Juleen China.  Thomasene Lot, [place] 06/26/11 1538

## 2011-06-26 NOTE — ED Notes (Signed)
Pt has vomited a large amount of thin red liquid. Pa aware

## 2011-06-26 NOTE — ED Notes (Signed)
Patient transported to Ultrasound 

## 2011-06-26 NOTE — ED Provider Notes (Signed)
History     CSN: [id number] Arrival date & time: 06/26/2011  4:15 AM   First MD Initiated Contact with Patient 06/26/11 0450      Chief Complaint  Patient presents with  . Nausea  . Emesis    (Consider location/radiation/quality/duration/timing/severity/associated sxs/prior treatment) HPI Comments: Patient has had some intermittent nausea and vomiting for approximately one week. However last night she began having diffuse abdominal pain which he reports is mostly in her upper half of her abdomen. She also has been battling bronchitis with associated wheezing, hoarse voice, paroxysmal coughs for about 3 weeks. She reports she is allergic to steroids. She has been using her albuterol inhaler as well as her some according home. She is taking over-the-counter cough medication. She was trying to remedy herself as far as her intermittent vomiting at home without significant improvement. She thinks she is vomiting about 3 or 4 times per day, usually whenever she tries to eat or drink anything. She denies any diarrhea. Her last bowel movement was at midnight. Since then she has not passed any gas or had any further bowel movements. She denies any bleeding. She denies any GYN symptoms. Her primary care physician is Dr. Algie Coffer. She has had a tubal pregnancy with associated surgery as well as uterine fibroids removed in the past.  Patient is a 35 y.o. female presenting with vomiting. The history is provided by the patient.  Emesis  Associated symptoms include abdominal pain and cough. Pertinent negatives include no chills, no diarrhea and no fever.    Past Medical History  Diagnosis Date  . Asthma   . Environmental allergies   . Fibroids     Past Surgical History  Procedure Date  . Ectopic pregnancy surgery   . Uterine fibroid surgery     No family history on file.  History  Substance Use Topics  . Smoking status: Former Games developer  . Smokeless tobacco: Not on file  . Alcohol Use: No     OB History    Grav Para Term Preterm Abortions TAB SAB Ect Mult Living                  Review of Systems  Constitutional: Positive for appetite change. Negative for fever and chills.  HENT: Positive for sore throat.   Respiratory: Positive for cough, shortness of breath and wheezing.   Gastrointestinal: Positive for nausea, vomiting and abdominal pain. Negative for diarrhea, constipation and blood in stool.  Musculoskeletal: Negative for back pain.  Skin: Negative for rash.  Neurological: Negative for weakness and numbness.  All other systems reviewed and are negative.    Allergies  Bee venom and Prednisone  Home Medications   Current Outpatient Rx  Name Route Sig Dispense Refill  . ALBUTEROL SULFATE HFA 108 (90 BASE) MCG/ACT IN AERS Inhalation Inhale 1-2 puffs into the lungs every 6 (six) hours as needed for wheezing. 1 Inhaler 0  . BUDESONIDE-FORMOTEROL FUMARATE 160-4.5 MCG/ACT IN AERO Inhalation Inhale 2 puffs into the lungs 2 (two) times daily.      . GUAIFENESIN 100 MG/5ML PO SOLN Oral Take 5 mLs by mouth every 4 (four) hours as needed. For cough     . MONTELUKAST SODIUM 10 MG PO TABS Oral Take 10 mg by mouth at bedtime.        BP 101/56  Pulse 83  Temp(Src) 98.2 F (36.8 C) (Oral)  Resp 20  SpO2 98%  Physical Exam  Nursing note and vitals reviewed. Constitutional: She is  oriented to person, place, and time. She appears well-developed and well-nourished.  HENT:  Head: Normocephalic and atraumatic.       Likely laryngitis, voice is soft, very hoarse.  No stridor   Eyes: Pupils are equal, round, and reactive to light. No scleral icterus.  Neck: Neck supple.  Cardiovascular: Normal rate and regular rhythm.   Pulmonary/Chest: Effort normal. She has wheezes. She exhibits no tenderness.  Abdominal: Soft. She exhibits no distension. There is no hepatosplenomegaly. There is generalized tenderness. There is guarding. There is no rebound, no CVA tenderness, no  tenderness at McBurney's point and negative Murphy's sign.  Neurological: She is alert and oriented to person, place, and time.  Skin: Skin is warm and dry.    ED Course  Procedures (including critical care time)  Labs Reviewed  URINALYSIS, MICROSCOPIC ONLY - Abnormal; Notable for the following:    pH 8.5 (*)    All other components within normal limits  CBC - Abnormal; Notable for the following:    WBC 12.0 (*)    Hemoglobin 8.2 (*)    HCT 29.5 (*)    MCV 57.8 (*)    MCH 16.1 (*)    MCHC 27.8 (*)    RDW 20.0 (*)    Platelets 446 (*)    All other components within normal limits  DIFFERENTIAL - Abnormal; Notable for the following:    Neutro Abs 8.4 (*)    All other components within normal limits  BASIC METABOLIC PANEL - Abnormal; Notable for the following:    GFR calc non Af Amer 86 (*)    All other components within normal limits  HEPATIC FUNCTION PANEL - Abnormal; Notable for the following:    Albumin 3.2 (*)    AST 77 (*)    ALT 36 (*)    Indirect Bilirubin 0.2 (*)    All other components within normal limits  PREGNANCY, URINE  LIPASE, BLOOD   No results found.   No diagnosis found.    MDM  WBC is up, Hgb is down (but is at baseline).  Diffuse abd tenderness.  Also with coughing, laryngitis and diffuse wheezing.  Given vomiting and degree of diffuse tenderness, CT scan ordered.  Pt is signs out to Dr. Lynelle Doctor and to [facility] PA.  Pt is given second doses of IV anatiemetics and analgesics.         Gavin Pound. Oletta Lamas, MD 06/26/11 0981

## 2011-06-26 NOTE — ED Notes (Signed)
MD at bedside. 

## 2011-06-26 NOTE — Consult Note (Signed)
Reason for Consult:Abdominal pain/ Cholelithiasis/cholecystitis? Referring Physician: Dr. Oletta Lamas PCP- Dr. Zollie Scale [name] is an 35 y.o. female.  HPI: with a history of nausea and vomiting for the past week off and on. She has had bronchitis for the past month and was febrile and coughing with this, but the N/V was new this past week. Last night she then developed pain in her upper abdomen which has not gone away even after several doses of Dilaudid and she continues to feel nauseated, but has not vomited.  She has been having BM without problems and denies any urinary difficulties. She has chronic Fe deficiency anemia, due to heavy menses related to uterine fibroids. We are asked to assess for cholelithiasis seen on [place] and ABD CT and possible early cholecystitis/choledocholelithiasis. She has minimally elevated ALT/AST and leukocytosis wbc 12.0, but LFT's are otherwise normal.   Past Medical History  Diagnosis Date  . Asthma   . Environmental allergies   . Fibroids     Past Surgical History  Procedure Date  . Ectopic pregnancy surgery   . Uterine fibroid surgery     No family history on file.  Social History:  reports that she has quit smoking. She does not have any smokeless tobacco history on file. She reports that she does not drink alcohol or use illicit drugs.   She is married and adopted a child who is 2 months old.  Allergies:  Allergies  Allergen Reactions  . Bee Venom Anaphylaxis    Patient has an epi pen, also has not been stung before but was informed she is allergic  . Prednisone Hives    Medications:                            ALBUTEROL SULFATE HFA 108 (90 BASE) MCG/ACT IN AERS  Inhalation  Inhale 1-2 puffs into the lungs every 6 (six) hours as needed for wheezing.  1 Inhaler  0  .  BUDESONIDE-FORMOTEROL FUMARATE 160-4.5 MCG/ACT IN AERO  Inhalation  Inhale 2 puffs into the lungs 2 (two) times daily.  .  GUAIFENESIN 100 MG/5ML PO SOLN  Oral  Take  5 mLs by mouth every 4 (four) hours as needed. For cough  .  MONTELUKAST SODIUM 10 MG PO TABS  Oral  Take 10 mg by mouth at bedtime.    Results for orders placed during the hospital encounter of 06/26/11 (from the past 48 hour(s))  CBC     Status: Abnormal   Collection Time   06/26/11  4:55 AM      Component Value Range Comment   WBC 12.0 (*) 4.0 - 10.5 (K/uL)    RBC 5.10  3.87 - 5.11 (MIL/uL)    Hemoglobin 8.2 (*) 12.0 - 15.0 (g/dL)    HCT 08.6 (*) 57.8 - 46.0 (%)    MCV 57.8 (*) 78.0 - 100.0 (fL)    MCH 16.1 (*) 26.0 - 34.0 (pg)    MCHC 27.8 (*) 30.0 - 36.0 (g/dL)    RDW 46.9 (*) 62.9 - 15.5 (%)    Platelets 446 (*) 150 - 400 (K/uL)   DIFFERENTIAL     Status: Abnormal   Collection Time   06/26/11  4:55 AM      Component Value Range Comment   Neutrophils Relative 70  43 - 77 (%)    Lymphocytes Relative 19  12 - 46 (%)    Monocytes Relative 5  3 -  12 (%)    Eosinophils Relative 5  0 - 5 (%)    Basophils Relative 1  0 - 1 (%)    Neutro Abs 8.4 (*) 1.7 - 7.7 (K/uL)    Lymphs Abs 2.3  0.7 - 4.0 (K/uL)    Monocytes Absolute 0.6  0.1 - 1.0 (K/uL)    Eosinophils Absolute 0.6  0.0 - 0.7 (K/uL)    Basophils Absolute 0.1  0.0 - 0.1 (K/uL)    RBC Morphology POLYCHROMASIA PRESENT   TARGET CELLS  BASIC METABOLIC PANEL     Status: Abnormal   Collection Time   06/26/11  4:55 AM      Component Value Range Comment   Sodium 138  135 - 145 (mEq/L)    Potassium 3.8  3.5 - 5.1 (mEq/L)    Chloride 103  96 - 112 (mEq/L)    CO2 26  19 - 32 (mEq/L)    Glucose, Bld 94  70 - 99 (mg/dL)    BUN 8  6 - 23 (mg/dL)    Creatinine, Ser 6.21  0.50 - 1.10 (mg/dL)    Calcium 9.3  8.4 - 10.5 (mg/dL)    GFR calc non Af Amer 86 (*) >90 (mL/min)    GFR calc Af Amer >90  >90 (mL/min)   LIPASE, BLOOD     Status: Normal   Collection Time   06/26/11  4:55 AM      Component Value Range Comment   Lipase 33  11 - 59 (U/L)   HEPATIC FUNCTION PANEL     Status: Abnormal   Collection Time   06/26/11  4:55 AM        Component Value Range Comment   Total Protein 7.9  6.0 - 8.3 (g/dL)    Albumin 3.2 (*) 3.5 - 5.2 (g/dL)    AST 77 (*) 0 - 37 (U/L)    ALT 36 (*) 0 - 35 (U/L)    Alkaline Phosphatase 78  39 - 117 (U/L)    Total Bilirubin 0.3  0.3 - 1.2 (mg/dL)    Bilirubin, Direct 0.1  0.0 - 0.3 (mg/dL)    Indirect Bilirubin 0.2 (*) 0.3 - 0.9 (mg/dL)   URINALYSIS, MICROSCOPIC ONLY     Status: Abnormal   Collection Time   06/26/11  5:01 AM      Component Value Range Comment   Color, Urine YELLOW  YELLOW     APPearance CLEAR  CLEAR     Specific Gravity, Urine 1.018  1.005 - 1.030     pH 8.5 (*) 5.0 - 8.0     Glucose, UA NEGATIVE  NEGATIVE (mg/dL)    Hgb urine dipstick NEGATIVE  NEGATIVE     Bilirubin Urine NEGATIVE  NEGATIVE     Ketones, ur NEGATIVE  NEGATIVE (mg/dL)    Protein, ur NEGATIVE  NEGATIVE (mg/dL)    Urobilinogen, UA 0.2  0.0 - 1.0 (mg/dL)    Nitrite NEGATIVE  NEGATIVE     Leukocytes, UA NEGATIVE  NEGATIVE     WBC, UA 0-2  <3 (WBC/hpf)    Bacteria, UA RARE  RARE     Squamous Epithelial / LPF RARE  RARE    PREGNANCY, URINE     Status: Normal   Collection Time   06/26/11  5:01 AM      Component Value Range Comment   Preg Test, Ur NEGATIVE       US Abdomen Complete  06/26/2011  *RADIOLOGY REPORT*  Clinical Data:  35 year old with pain.  COMPLETE ABDOMINAL ULTRASOUND  Comparison:  Abdominal CT 06/26/2011  Findings:  Gallbladder:  There are multiple echogenic stones in the gallbladder that demonstrate posterior acoustic shadowing.  The gallbladder is not significantly distended but there is mild wall thickening measuring up to 4 mm.  Largest stones measure 5 mm. There is not a sonographic Murphy's sign.  Common bile duct:  Measures 4 mm in diameter.  Liver:  No focal lesion identified.  Within normal limits in parenchymal echogenicity.  IVC:  Appears normal.  Pancreas:  No focal abnormality seen.  Spleen:  Measures 6.8 cm in length.  Right Kidney:  Measures 12.2 cm in length with normal  echotexture. Negative for hydronephrosis.  Left Kidney:  Measures 11.6 cm in length without hydronephrosis.  Abdominal aorta:  No aneurysm identified.  Other findings:  There is a soft tissue structure in the lower abdomen and this is compatible with a uterine fibroid based on the recent CT findings.  IMPRESSION: Cholelithiasis.  There is mild gallbladder wall thickening but the gallbladder is likely contracted.  No evidence for a sonographic Murphy's sign.  Soft tissue structure in the lower abdomen is compatible with an enlarged uterus and fibroid.  Original Report Authenticated By: Richarda Overlie, M.D.   Ct Abdomen Pelvis W Contrast  06/26/2011  *RADIOLOGY REPORT*  Clinical Data: Abdominal pain, nausea, vomiting  CT ABDOMEN AND PELVIS WITH CONTRAST  Technique:  Multidetector CT imaging of the abdomen and pelvis was performed following the standard protocol during bolus administration of intravenous contrast.  Contrast:  100 ml Omnipaque-300  Comparison: Chest x-ray of 05/26/2011  Findings: The lung bases are clear.  There is some thickening of the mucosa of the distal esophagus which may be due to esophagitis or possibly a small hiatal hernia.  The liver enhances with no focal abnormality and no ductal dilatation is seen.  Small gallstones layer within the gallbladder.  The gallbladder does not appear to be distended.  However, there do appear to be common bile duct calculi within the distal common bile duct without definite obstruction currently.  These may be intermittently obstructing calculi.  The pancreas is normal in size and the pancreatic duct is not dilated.  The adrenal glands and spleen are unremarkable.  The kidneys enhance with no calculus or mass and no hydronephrosis is seen.  The abdominal aorta is normal in caliber.  There is enlarged lobular uterus present consistent with multiple uterine fibroids.  The largest fibroid is within the uterine fundus measuring 8.7 x 12.9 x 9.0 cm.  The urinary  bladder is unremarkable.  No free fluid is seen within the pelvis.  Probable small right ovarian cyst is noted.  No abnormality of the colon is seen.  No bony abnormality is seen.  IMPRESSION:  1.  Gallstones and apparent nonobstructing distal common bile duct calculi which may be intermittently obstructing. 2.  Enlarged multi fibroid uterus.  I discussed the findings of this study with Dr. Linwood Dibbles at 9:02 a.m. on 06/26/2011.  Original Report Authenticated By: Juline Patch, M.D.    Review of Systems  Constitutional: Positive for fever, chills and malaise/fatigue.  HENT: Positive for congestion and sore throat.   Eyes: Negative.   Respiratory: Positive for cough, sputum production, shortness of breath and wheezing.   Gastrointestinal: Positive for nausea, vomiting and abdominal pain. Negative for diarrhea, constipation, blood in stool and melena.  Genitourinary: Negative.   Skin: Negative for itching.   Blood pressure 152/77, pulse  72, temperature 97.5 F (36.4 C), temperature source Oral, resp. rate 18, SpO2 94.00%. Physical Exam  Constitutional: She is oriented to person, place, and time. She appears well-developed and well-nourished. She appears distressed.       Morbidly obese AA female  HENT:  Head: Normocephalic and atraumatic.  Eyes: Conjunctivae and EOM are normal. Pupils are equal, round, and reactive to light.  Neck: Normal range of motion. Neck supple. No tracheal deviation present. No thyromegaly present.  Cardiovascular: Normal rate, regular rhythm, normal heart sounds and intact distal pulses.   Respiratory: No stridor. She is in respiratory distress. She has wheezes. She has no rales. She exhibits no tenderness.       Mild respiratory distress and hoarseness and decreased vocal quality  GI:       Pain to palpation in upper quadrants and epigastrium, especially on the right side. Mildly positive Murphy's. Obese, soft and non-tender in lower quadrants. +BS, no guarding, no  rebound  Musculoskeletal: Normal range of motion.  Neurological: She is alert and oriented to person, place, and time.  Skin: Skin is warm and dry.       Numerous skin tatoos  Psychiatric: She has a normal mood and affect. Her behavior is normal. Judgment and thought content normal.    Assessment/Plan: Cholelithiasis, possible early cholecystitis Bronchitis/asthma with acute exacerbation Uterine fibroids Chronic iron deficiency anemia obesity  Plan: Suspect pt may need cholecystectomy this hospitalization. She seems to be having significant respiratory issues as well. May want hospitalist to admit. Will discuss with Dr. Corliss Skains.  Izacc Demeyer,PA-C Pager [phone] General Trauma Pager [phone]

## 2011-06-26 NOTE — ED Provider Notes (Signed)
Discussed with surgery PA. Confirmed that pt is going to be admitted to [facility].  Raeford Razor, MD 06/26/11 [phone]

## 2011-06-26 NOTE — ED Notes (Signed)
Ct called and made aware pt vomiting and will not be able to finish her po ct prep

## 2011-06-26 NOTE — ED Notes (Signed)
Patient with nausea and vomiting that started last night at 9pm.  No diarrhea.

## 2011-06-26 NOTE — ED Notes (Signed)
Patient transported to CT 

## 2011-06-26 NOTE — ED Notes (Signed)
[id number] ready

## 2011-06-27 ENCOUNTER — Encounter (HOSPITAL_COMMUNITY): Payer: Self-pay | Admitting: Anesthesiology

## 2011-06-27 ENCOUNTER — Encounter (HOSPITAL_COMMUNITY): Admission: EM | Disposition: A | Discharge status: Home / Self Care | Payer: Self-pay | Source: Home / Self Care

## 2011-06-27 ENCOUNTER — Encounter (HOSPITAL_COMMUNITY): Payer: Self-pay | Admitting: *Deleted

## 2011-06-27 LAB — PREPARE RBC (CROSSMATCH)

## 2011-06-27 LAB — CBC
MCV: 58.3 fL — ABNORMAL LOW (ref 78.0–100.0)
Platelets: 445 10*3/uL — ABNORMAL HIGH (ref 150–400)
RDW: 20.1 % — ABNORMAL HIGH (ref 11.5–15.5)
WBC: 8.4 10*3/uL (ref 4.0–10.5)

## 2011-06-27 LAB — COMPREHENSIVE METABOLIC PANEL
Albumin: 2.8 g/dL — ABNORMAL LOW (ref 3.5–5.2)
Alkaline Phosphatase: 75 U/L (ref 39–117)
BUN: 5 mg/dL — ABNORMAL LOW (ref 6–23)
CO2: 27 mEq/L (ref 19–32)
Chloride: 105 mEq/L (ref 96–112)
GFR calc Af Amer: 86 mL/min — ABNORMAL LOW (ref >90)
GFR calc non Af Amer: 74 mL/min — ABNORMAL LOW (ref >90)
Glucose, Bld: 91 mg/dL (ref 70–99)
Potassium: 3.9 mEq/L (ref 3.5–5.1)
Total Bilirubin: 0.3 mg/dL (ref 0.3–1.2)

## 2011-06-27 LAB — ABO/RH: ABO/RH(D): O POS

## 2011-06-27 SURGERY — LAPAROSCOPIC CHOLECYSTECTOMY WITH INTRAOPERATIVE CHOLANGIOGRAM
Anesthesia: General

## 2011-06-27 MED ORDER — HYDROCODONE-HOMATROPINE 5-1.5 MG/5ML PO SYRP
5.0000 mL | ORAL_SOLUTION | Freq: Two times a day (BID) | ORAL | Status: DC
  Administered 2011-06-27 – 2011-07-04 (×12): 5 mL via ORAL
  Filled 2011-06-27 (×13): qty 5

## 2011-06-27 MED ORDER — ALBUTEROL SULFATE (5 MG/ML) 0.5% IN NEBU
2.5000 mg | INHALATION_SOLUTION | RESPIRATORY_TRACT | Status: DC | PRN
  Filled 2011-06-27 (×2): qty 0.5

## 2011-06-27 MED ORDER — ALBUTEROL SULFATE (5 MG/ML) 0.5% IN NEBU
2.5000 mg | INHALATION_SOLUTION | Freq: Four times a day (QID) | RESPIRATORY_TRACT | Status: DC
  Administered 2011-06-27 – 2011-06-29 (×9): 2.5 mg via RESPIRATORY_TRACT
  Filled 2011-06-27 (×10): qty 0.5

## 2011-06-27 MED ORDER — LACTATED RINGERS IV SOLN
INTRAVENOUS | Status: DC | PRN
  Administered 2011-06-27 – 2012-11-14 (×4): via INTRAVENOUS

## 2011-06-27 MED ORDER — BUDESONIDE-FORMOTEROL FUMARATE 80-4.5 MCG/ACT IN AERO: 2.0000 | INHALATION_SPRAY | Freq: Two times a day (BID) | RESPIRATORY_TRACT | Status: DC

## 2011-06-27 MED ORDER — METHYLPREDNISOLONE SODIUM SUCC 125 MG IJ SOLR
60.0000 mg | Freq: Four times a day (QID) | INTRAMUSCULAR | Status: DC
  Administered 2011-06-27 – 2011-06-29 (×6): 60 mg via INTRAVENOUS
  Filled 2011-06-27 (×2): qty 0.96
  Filled 2011-06-27: qty 2
  Filled 2011-06-27 (×7): qty 0.96

## 2011-06-27 MED ORDER — BENZONATATE 100 MG PO CAPS
100.0000 mg | ORAL_CAPSULE | Freq: Three times a day (TID) | ORAL | Status: DC
  Administered 2011-06-27 – 2011-07-04 (×18): 100 mg via ORAL
  Filled 2011-06-27 (×23): qty 1

## 2011-06-27 MED ORDER — IPRATROPIUM BROMIDE 0.02 % IN SOLN
0.5000 mg | Freq: Four times a day (QID) | RESPIRATORY_TRACT | Status: DC
  Administered 2011-06-27 – 2011-06-29 (×9): 0.5 mg via RESPIRATORY_TRACT
  Filled 2011-06-27 (×10): qty 2.5

## 2011-06-27 MED ORDER — METHYLPREDNISOLONE SODIUM SUCC 125 MG IJ SOLR
125.0000 mg | Freq: Once | INTRAMUSCULAR | Status: AC
  Administered 2011-06-27: 125 mg via INTRAVENOUS
  Filled 2011-06-27: qty 2

## 2011-06-27 SURGICAL SUPPLY — 42 items
APL SKNCLS STERI-STRIP NONHPOA (GAUZE/BANDAGES/DRESSINGS) ×1
APPLIER CLIP 5 13 M/L LIGAMAX5 (MISCELLANEOUS) ×2
APR CLP MED LRG 5 ANG JAW (MISCELLANEOUS) ×1
BAG SPEC RTRVL LRG 6X4 10 (ENDOMECHANICALS) ×1
BANDAGE ADHESIVE 1X3 (GAUZE/BANDAGES/DRESSINGS) ×6 IMPLANT
BENZOIN TINCTURE PRP APPL 2/3 (GAUZE/BANDAGES/DRESSINGS) ×2 IMPLANT
BLADE SURG ROTATE 9660 (MISCELLANEOUS) IMPLANT
CANISTER SUCTION 2500CC (MISCELLANEOUS) ×2 IMPLANT
CHLORAPREP W/TINT 26ML (MISCELLANEOUS) ×2 IMPLANT
CLIP APPLIE 5 13 M/L LIGAMAX5 (MISCELLANEOUS) ×1 IMPLANT
CLOTH BEACON ORANGE TIMEOUT ST (SAFETY) ×2 IMPLANT
COVER MAYO STAND STRL (DRAPES) IMPLANT
COVER SURGICAL LIGHT HANDLE (MISCELLANEOUS) ×2 IMPLANT
DECANTER SPIKE VIAL GLASS SM (MISCELLANEOUS) ×2 IMPLANT
DRAPE C-ARM 42X72 X-RAY (DRAPES) IMPLANT
DRAPE UTILITY 15X26 W/TAPE STR (DRAPE) ×4 IMPLANT
DRSG TEGADERM 4X4.75 (GAUZE/BANDAGES/DRESSINGS) ×2 IMPLANT
ELECT REM PT RETURN 9FT ADLT (ELECTROSURGICAL) ×2
ELECTRODE REM PT RTRN 9FT ADLT (ELECTROSURGICAL) ×1 IMPLANT
GAUZE SPONGE 2X2 8PLY STRL LF (GAUZE/BANDAGES/DRESSINGS) ×1 IMPLANT
GLOVE ECLIPSE 7.5 STRL STRAW (GLOVE) ×2 IMPLANT
GOWN STRL NON-REIN LRG LVL3 (GOWN DISPOSABLE) ×4 IMPLANT
GOWN STRL REIN XL XLG (GOWN DISPOSABLE) ×2 IMPLANT
KIT BASIN OR (CUSTOM PROCEDURE TRAY) ×2 IMPLANT
KIT ROOM TURNOVER OR (KITS) ×2 IMPLANT
NS IRRIG 1000ML POUR BTL (IV SOLUTION) ×2 IMPLANT
PAD ARMBOARD 7.5X6 YLW CONV (MISCELLANEOUS) ×4 IMPLANT
POUCH SPECIMEN RETRIEVAL 10MM (ENDOMECHANICALS) ×2 IMPLANT
SCISSORS LAP 5X35 DISP (ENDOMECHANICALS) IMPLANT
SET CHOLANGIOGRAPH 5 50 .035 (SET/KITS/TRAYS/PACK) IMPLANT
SET IRRIG TUBING LAPAROSCOPIC (IRRIGATION / IRRIGATOR) ×2 IMPLANT
SLEEVE ENDOPATH XCEL 5M (ENDOMECHANICALS) ×4 IMPLANT
SPECIMEN JAR SMALL (MISCELLANEOUS) ×2 IMPLANT
SPONGE GAUZE 2X2 STER 10/PKG (GAUZE/BANDAGES/DRESSINGS) ×1
SUT MNCRL AB 4-0 PS2 18 (SUTURE) ×2 IMPLANT
SUT VICRYL 0 UR6 27IN ABS (SUTURE) IMPLANT
TOWEL OR 17X24 6PK STRL BLUE (TOWEL DISPOSABLE) ×2 IMPLANT
TOWEL OR 17X26 10 PK STRL BLUE (TOWEL DISPOSABLE) ×2 IMPLANT
TRAY LAPAROSCOPIC (CUSTOM PROCEDURE TRAY) ×2 IMPLANT
TROCAR XCEL BLUNT TIP 100MML (ENDOMECHANICALS) ×2 IMPLANT
TROCAR XCEL NON-BLD 5MMX100MML (ENDOMECHANICALS) ×2 IMPLANT
WATER STERILE IRR 1000ML POUR (IV SOLUTION) IMPLANT

## 2011-06-27 NOTE — Progress Notes (Signed)
35yo female with cholecystitis with known asthma with probable viral infection and bronchitis for a couple of weeks. Now has active wheezing on chest exam. Spoke with Dr Andrey Campanile about electiveness of the procedure and he agreed to cancel the surgery and have her seen by an internist to optimize her pulmonary status.  Lonia Skinner MD

## 2011-06-27 NOTE — Consult Note (Signed)
Pt seen and examined.  Chart reviewed.  Will need IOC during surgery to evaluate for stones in distal CBD. However, LFTs normalizing today  Symptomatic cholelithiasis, possible cholecystitis Iron def anemia Morbid obesity Uterine fibroids Asthma  Mary Sella. Andrey Campanile, MD, FACS General, Bariatric, & Minimally Invasive Surgery [facility], [place]

## 2011-06-27 NOTE — Preoperative (Signed)
Beta Blockers   Reason not to administer Beta Blockers:Not Applicable 

## 2011-06-27 NOTE — Anesthesia Preprocedure Evaluation (Addendum)
Anesthesia Evaluation  Patient identified by MRN, date of birth, ID band Patient awake    Reviewed: Allergy & Precautions, H&P , NPO status , Patient's Chart, lab work & pertinent test results  History of Anesthesia Complications Negative for: history of anesthetic complications  Airway Mallampati: II TM Distance: >3 FB Neck ROM: full    Dental  (+) Dental Advidsory Given and Teeth Intact   Pulmonary asthma ,    + wheezing      Cardiovascular neg cardio ROS     Neuro/Psych Negative Neurological ROS  Negative Psych ROS   GI/Hepatic Neg liver ROS, GERD-  Medicated and Controlled,  Endo/Other  Negative Endocrine ROS  Renal/GU negative Renal ROS  Genitourinary negative   Musculoskeletal   Abdominal Normal abdominal exam  (+)   Peds  Hematology negative hematology ROS (+)   Anesthesia Other Findings   Reproductive/Obstetrics negative OB ROS                           Anesthesia Physical Anesthesia Plan  ASA: II  Anesthesia Plan: General ETT   Post-op Pain Management:    Induction:   Airway Management Planned:   Additional Equipment:   Intra-op Plan:   Post-operative Plan:   Informed Consent: I have reviewed the patients History and Physical, chart, labs and discussed the procedure including the risks, benefits and alternatives for the proposed anesthesia with the patient or authorized representative who has indicated his/her understanding and acceptance.   Dental Advisory Given  Plan Discussed with: Anesthesiologist  Anesthesia Plan Comments:         Anesthesia Quick Evaluation

## 2011-06-27 NOTE — Progress Notes (Signed)
Subjective: Stomach feels better. No n/v.   Objective: Vital signs in last 24 hours: Temp:  [97.5 F (36.4 C)-98.9 F (37.2 C)] 98.5 F (36.9 C) (12/15 0640) Pulse Rate:  [70-101] 79  (12/15 0640) Resp:  [16-18] 16  (12/15 0640) BP: (118-152)/(68-77) 120/68 mmHg (12/15 0640) SpO2:  [94 %-99 %] 98 % (12/15 0640)   Intake/Output from previous day: 12/14 0701 - 12/15 0700 In: 240 [P.O.:240] Out: -  Intake/Output this shift:    Gen: alert, nad, morbid obesity Pulm: symmetric chest rise, CTA on inspiration; mild wheeze b/l on exp CV: reg Abd: soft, obese, TTP ruq, epigastric; no RT Ext: no edema  Lab Results:   Basename 06/27/11 0630 06/26/11 0455  WBC 8.4 12.0*  HGB 7.1* 8.2*  HCT 26.4* 29.5*  PLT 445* 446*   BMET  Basename 06/27/11 0630 06/26/11 0455  NA 138 138  K 3.9 3.8  CL 105 103  CO2 27 26  GLUCOSE 91 94  BUN 5* 8  CREATININE 0.97 0.86  CALCIUM 8.5 9.3   CMET    Component Value Date/Time   NA 138 06/27/2011 0630   K 3.9 06/27/2011 0630   CL 105 06/27/2011 0630   CO2 27 06/27/2011 0630   BUN 5* 06/27/2011 0630   CREATININE 0.97 06/27/2011 0630   GLUCOSE 91 06/27/2011 0630   CALCIUM 8.5 06/27/2011 0630   AST 43* 06/27/2011 0630   ALT 37* 06/27/2011 0630   ALKPHOS 75 06/27/2011 0630   BILITOT 0.3 06/27/2011 0630   PROT 6.7 06/27/2011 0630   ALBUMIN 2.8* 06/27/2011 0630   PT/INR No results found for this basename: LABPROT:2,INR:2 in the last 72 hours   Studies/Results: Dg Chest 2 View  06/26/2011  *RADIOLOGY REPORT*  Clinical Data: Dizziness and weakness.  Upper respiratory infection.  CHEST - 2 VIEW  Comparison:  05/26/2011.  Findings: Trachea is midline.  Heart size normal.  Mild interstitial prominence appears unchanged from 05/26/2011.  No focal airspace consolidation or pleural fluid.  IMPRESSION: Mild interstitial prominence appears unchanged 05/26/2011 and is therefore difficult to exclude a viral process.  Original Report  Authenticated By: Reyes Ivan, M.D.   US Abdomen Complete  06/26/2011  *RADIOLOGY REPORT*  Clinical Data:  35 year old with pain.  COMPLETE ABDOMINAL ULTRASOUND  Comparison:  Abdominal CT 06/26/2011  Findings:  Gallbladder:  There are multiple echogenic stones in the gallbladder that demonstrate posterior acoustic shadowing.  The gallbladder is not significantly distended but there is mild wall thickening measuring up to 4 mm.  Largest stones measure 5 mm. There is not a sonographic Murphy's sign.  Common bile duct:  Measures 4 mm in diameter.  Liver:  No focal lesion identified.  Within normal limits in parenchymal echogenicity.  IVC:  Appears normal.  Pancreas:  No focal abnormality seen.  Spleen:  Measures 6.8 cm in length.  Right Kidney:  Measures 12.2 cm in length with normal echotexture. Negative for hydronephrosis.  Left Kidney:  Measures 11.6 cm in length without hydronephrosis.  Abdominal aorta:  No aneurysm identified.  Other findings:  There is a soft tissue structure in the lower abdomen and this is compatible with a uterine fibroid based on the recent CT findings.  IMPRESSION: Cholelithiasis.  There is mild gallbladder wall thickening but the gallbladder is likely contracted.  No evidence for a sonographic Murphy's sign.  Soft tissue structure in the lower abdomen is compatible with an enlarged uterus and fibroid.  Original Report Authenticated By: Richarda Overlie, M.D.  Ct Abdomen Pelvis W Contrast  06/26/2011  *RADIOLOGY REPORT*  Clinical Data: Abdominal pain, nausea, vomiting  CT ABDOMEN AND PELVIS WITH CONTRAST  Technique:  Multidetector CT imaging of the abdomen and pelvis was performed following the standard protocol during bolus administration of intravenous contrast.  Contrast:  100 ml Omnipaque-300  Comparison: Chest x-ray of 05/26/2011  Findings: The lung bases are clear.  There is some thickening of the mucosa of the distal esophagus which may be due to esophagitis or possibly a  small hiatal hernia.  The liver enhances with no focal abnormality and no ductal dilatation is seen.  Small gallstones layer within the gallbladder.  The gallbladder does not appear to be distended.  However, there do appear to be common bile duct calculi within the distal common bile duct without definite obstruction currently.  These may be intermittently obstructing calculi.  The pancreas is normal in size and the pancreatic duct is not dilated.  The adrenal glands and spleen are unremarkable.  The kidneys enhance with no calculus or mass and no hydronephrosis is seen.  The abdominal aorta is normal in caliber.  There is enlarged lobular uterus present consistent with multiple uterine fibroids.  The largest fibroid is within the uterine fundus measuring 8.7 x 12.9 x 9.0 cm.  The urinary bladder is unremarkable.  No free fluid is seen within the pelvis.  Probable small right ovarian cyst is noted.  No abnormality of the colon is seen.  No bony abnormality is seen.  IMPRESSION:  1.  Gallstones and apparent nonobstructing distal common bile duct calculi which may be intermittently obstructing. 2.  Enlarged multi fibroid uterus.  I discussed the findings of this study with Dr. Linwood Dibbles at 9:02 a.m. on 06/26/2011.  Original Report Authenticated By: Juline Patch, M.D.    Anti-infectives: Anti-infectives     Start     Dose/Rate Route Frequency Ordered Stop   06/26/11 2200  piperacillin-tazobactam (ZOSYN) IVPB 3.375 g       3.375 g 12.5 mL/hr over 240 Minutes Intravenous 3 times per day 06/26/11 2101              Assessment/Plan: s/p Procedure(s): LAPAROSCOPIC CHOLECYSTECTOMY WITH INTRAOPERATIVE CHOLANGIOGRAM today  AST/ALT better today.   IDA Morbid obesity Asthma URI Symptomatic cholelithiasis, probable acute cholecystitis Uterine fibroids  To or for LC with IOC Cont IV abx Type and screen for hgb 7; will not transfuse Will prob need iron supplement pulm toilet.   We discussed  gallbladder disease. The patient was given Agricultural engineer. We discussed non-operative and operative management.   I discussed laparoscopic cholecystectomy with IOC in detail.  The patient was given educational material as well as diagrams detailing the procedure.  We discussed the risks and benefits of a laparoscopic cholecystectomy including, but not limited to bleeding, infection, injury to surrounding structures such as the intestine or liver, bile leak, retained gallstones, need to convert to an open procedure, prolonged diarrhea, blood clots such as  DVT, common bile duct injury, anesthesia risks, and possible need for additional procedures.  We discussed the typical post-operative recovery course. I explained that the likelihood of improvement of their symptoms is good.  Mary Sella. Andrey Campanile, MD, FACS General, Bariatric, & Minimally Invasive Surgery [facility], [place]     LOS: 1 day    Atilano Ina 06/27/2011

## 2011-06-27 NOTE — Consult Note (Signed)
Medical Consultation   [name]  JWJ:[id number]  DOB: 1976-07-07  DOA: 06/26/2011  PCP: Provider Not In System  Requesting physician: [facility] M.D. Dr. Gaynelle Adu  Reason for consultation: Asthma exacerbation and wheezing   History of Present Illness: Patient is a 35 year old female with known history of asthma had originally presented for right-sided upper abdominal pain, nausea vomiting and was found to have acute/early cholecystitis/choledocholithiasis is currently admitted under Gen. surgery service. The surgery (cholecystectomy) was canceled today due to active wheezing on the chest exam. Patient states that she has chronic cough for over 6 months and productive phlegm. She has off and on wheezing and uses Symbicort with Singulair at home. Patient also feels that her asthma is not being optimally managed by her outpatient physician and she is always coughing and having frequent asthma exacerbations. Patient also requests to have [facility] pulmonology referral at the time of discharge. She denies any fevers or chills, any pleuritic chest pain. She denies any flu or pneumonia in the family however she had not had any flu vaccine this year. Patient states that the last time she had flu vaccine she got very sick and since then she does not flu vaccine. Patient states that she is allergic to by mouth prednisone however can take IV Solu-Medrol.  Allergies:   Allergies  Allergen Reactions  . Bee Venom Anaphylaxis    Patient has an epi pen, also has not been stung before but was informed she is allergic  . Prednisone Hives      Past Medical History  Diagnosis Date  . Asthma   . Environmental allergies   . Fibroids     Past Surgical History  Procedure Date  . Ectopic pregnancy surgery   . Uterine fibroid surgery     Social History:  Patient states that she has never smoked. She does not have any smokeless tobacco history on file. She reports that she does not drink alcohol or use  illicit drugs.  No family history on file.  Review of Systems:   Constitutional: Denies fever, chills, diaphoresis, appetite change and fatigue.  HEENT: Denies photophobia, eye pain, redness, hearing loss, ear pain, congestion, sore throat, rhinorrhea, sneezing, mouth sores, trouble swallowing, neck pain, neck stiffness and tinnitus.   Respiratory: See history of present illness   Cardiovascular: Denies chest pain, palpitations and leg swelling.  Gastrointestinal: See history of present illness  Genitourinary: Denies dysuria, urgency, frequency, hematuria, flank pain and difficulty urinating.  Musculoskeletal: Denies myalgias, back pain, joint swelling, arthralgias and gait problem.  Skin: Denies pallor, rash and wound.  Neurological: Denies dizziness, seizures, syncope, weakness, light-headedness, numbness and headaches.  Hematological: Denies adenopathy. Easy bruising, personal or family bleeding history  Psychiatric/Behavioral: Denies suicidal ideation, mood changes, confusion, nervousness, sleep disturbance and agitation   Physical Exam: Blood pressure 144/94, pulse 91, temperature 99.6 F (37.6 C), temperature source Oral, resp. rate 18, SpO2 97.00%.  General: Alert and awake, oriented x3, not in any acute distress. HEENT: anicteric sclera, pupils reactive to light and accommodation, EOMI CVS: S1-S2 clear, no murmur rubs or gallops Chest: Diffuse expiratory wheezing bilaterally  Abdomen: soft tender in right upper quadrant, obese, nondistended, normal bowel sounds, no organomegaly Extremities: no cyanosis, clubbing or edema noted bilaterally Neuro: Cranial nerves II-XII intact, no focal neurological deficits  Labs on Admission:  Basic Metabolic Panel:  Lab 06/27/11 6213 06/26/11 0455  NA 138 138  K 3.9 3.8  CL 105 103  CO2 27 26  GLUCOSE 91  94  BUN 5* 8  CREATININE 0.97 0.86  CALCIUM 8.5 9.3  MG -- --  PHOS -- --   Liver Function Tests:  Lab 06/27/11 0630 06/26/11  0455  AST 43* 77*  ALT 37* 36*  ALKPHOS 75 78  BILITOT 0.3 0.3  PROT 6.7 7.9  ALBUMIN 2.8* 3.2*    Lab 06/27/11 0630 06/26/11 0455  LIPASE 20 33  AMYLASE -- --   CBC:  Lab 06/27/11 0630 06/26/11 0455  WBC 8.4 12.0*  NEUTROABS -- 8.4*  HGB 7.1* 8.2*  HCT 26.4* 29.5*  MCV 58.3* 57.8*  PLT 445* 446*    Inpatient Medications:   Scheduled Meds:   . albuterol  2.5 mg Nebulization Q6H  . benzonatate  100 mg Oral TID  . budesonide-formoterol  2 puff Inhalation BID  . guaiFENesin  600 mg Oral BID  . guaiFENesin  600 mg Oral Once  . HYDROcodone-homatropine  5 mL Oral Q12H  . ipratropium  0.5 mg Nebulization Q6H  . methylPREDNISolone (SOLU-MEDROL) injection  125 mg Intravenous Once  . methylPREDNISolone (SOLU-MEDROL) injection  60 mg Intravenous Q6H  . metoCLOPramide      . montelukast  10 mg Oral QHS  . montelukast  10 mg Oral Once  . pantoprazole (PROTONIX) IV  40 mg Intravenous QHS  . piperacillin-tazobactam (ZOSYN)  IV  3.375 g Intravenous Q8H  . DISCONTD: budesonide-formoterol  2 puff Inhalation BID   Continuous Infusions:   . sodium chloride Stopped (06/26/11 1954)  . dextrose 5 % and 0.45 % NaCl with KCl 20 mEq/L 100 mL/hr at 06/26/11 2242     Radiological Exams on Admission: Dg Chest 2 View  06/26/2011  *RADIOLOGY REPORT*  Clinical Data: Dizziness and weakness.  Upper respiratory infection.  CHEST - 2 VIEW  Comparison:  05/26/2011.  Findings: Trachea is midline.  Heart size normal.  Mild interstitial prominence appears unchanged from 05/26/2011.  No focal airspace consolidation or pleural fluid.  IMPRESSION: Mild interstitial prominence appears unchanged 05/26/2011 and is therefore difficult to exclude a viral process.  Original Report Authenticated By: Reyes Ivan, M.D.   US Abdomen Complete  06/26/2011  *RADIOLOGY REPORT*  Clinical Data:  35 year old with pain.  COMPLETE ABDOMINAL ULTRASOUND  Comparison:  Abdominal CT 06/26/2011  Findings:  Gallbladder:   There are multiple echogenic stones in the gallbladder that demonstrate posterior acoustic shadowing.  The gallbladder is not significantly distended but there is mild wall thickening measuring up to 4 mm.  Largest stones measure 5 mm. There is not a sonographic Murphy's sign.  Common bile duct:  Measures 4 mm in diameter.  Liver:  No focal lesion identified.  Within normal limits in parenchymal echogenicity.  IVC:  Appears normal.  Pancreas:  No focal abnormality seen.  Spleen:  Measures 6.8 cm in length.  Right Kidney:  Measures 12.2 cm in length with normal echotexture. Negative for hydronephrosis.  Left Kidney:  Measures 11.6 cm in length without hydronephrosis.  Abdominal aorta:  No aneurysm identified.  Other findings:  There is a soft tissue structure in the lower abdomen and this is compatible with a uterine fibroid based on the recent CT findings.  IMPRESSION: Cholelithiasis.  There is mild gallbladder wall thickening but the gallbladder is likely contracted.  No evidence for a sonographic Murphy's sign.  Soft tissue structure in the lower abdomen is compatible with an enlarged uterus and fibroid.  Original Report Authenticated By: Richarda Overlie, M.D.   Ct Abdomen Pelvis W Contrast  06/26/2011  *RADIOLOGY REPORT*  Clinical Data: Abdominal pain, nausea, vomiting  CT ABDOMEN AND PELVIS WITH CONTRAST  Technique:  Multidetector CT imaging of the abdomen and pelvis was performed following the standard protocol during bolus administration of intravenous contrast.  Contrast:  100 ml Omnipaque-300  Comparison: Chest x-ray of 05/26/2011  Findings: The lung bases are clear.  There is some thickening of the mucosa of the distal esophagus which may be due to esophagitis or possibly a small hiatal hernia.  The liver enhances with no focal abnormality and no ductal dilatation is seen.  Small gallstones layer within the gallbladder.  The gallbladder does not appear to be distended.  However, there do appear to be common  bile duct calculi within the distal common bile duct without definite obstruction currently.  These may be intermittently obstructing calculi.  The pancreas is normal in size and the pancreatic duct is not dilated.  The adrenal glands and spleen are unremarkable.  The kidneys enhance with no calculus or mass and no hydronephrosis is seen.  The abdominal aorta is normal in caliber.  There is enlarged lobular uterus present consistent with multiple uterine fibroids.  The largest fibroid is within the uterine fundus measuring 8.7 x 12.9 x 9.0 cm.  The urinary bladder is unremarkable.  No free fluid is seen within the pelvis.  Probable small right ovarian cyst is noted.  No abnormality of the colon is seen.  No bony abnormality is seen.  IMPRESSION:  1.  Gallstones and apparent nonobstructing distal common bile duct calculi which may be intermittently obstructing. 2.  Enlarged multi fibroid uterus.  I discussed the findings of this study with Dr. Linwood Dibbles at 9:02 a.m. on 06/26/2011.  Original Report Authenticated By: Juline Patch, M.D.    Impression/Recommendations Principal problem: 1. abdominal pain with acute cholecystitis/acute choledocholithiasis: - Management per primary service ([facility]), agree that patient will need pulmonary status tuned up before the surgery.  Active Problems: 2. acute on chronic asthma exacerbation: - Even though patient has chronic asthma, I will still check flu PCR given her symptoms of nausea vomiting abdominal pain with upper respiratory bronchitis. - Patient needs to be on scheduled albuterol and Atrovent nebulizer breathing treatments, placed on incentive spirometry, and IV Solu-Medrol. Patient is already on Zosyn, and at the time of discharge, can DC on Augmentin or Avelox for a week. - Continue Symbicort, Singulair, added Tessalon Perles and Hycodan cough syrup. - Please place the outpatient referral to [facility] pulmonology at the time of discharge per patient's  request.  I will followup again in the morning. Please contact me if I can be of assistance in the meanwhile. Thank you for this consultation.  Time Spent on Admission: 1 hour  Jerral Mccauley 06/27/2011, 3:37 PM

## 2011-06-27 NOTE — Progress Notes (Signed)
Dr Isidoro Donning notified of pt hg 7.1 hct 26.4. Heme pos stool. A;so informed that pt has fibroids and is on her mensrtual period at this time [employer]

## 2011-06-28 LAB — CBC
HCT: 30.6 % — ABNORMAL LOW (ref 36.0–46.0)
Hemoglobin: 9 g/dL — ABNORMAL LOW (ref 12.0–15.0)
MCHC: 29.4 g/dL — ABNORMAL LOW (ref 30.0–36.0)
RBC: 5.18 MIL/uL — ABNORMAL HIGH (ref 3.87–5.11)

## 2011-06-28 LAB — INFLUENZA PANEL BY PCR (TYPE A & B)
H1N1 flu by pcr: NOT DETECTED
Influenza A By PCR: NEGATIVE
Influenza B By PCR: NEGATIVE

## 2011-06-28 LAB — DIFFERENTIAL
Basophils Relative: 0 % (ref 0–1)
Eosinophils Absolute: 0 10*3/uL (ref 0.0–0.7)
Eosinophils Relative: 0 % (ref 0–5)
Lymphocytes Relative: 10 % — ABNORMAL LOW (ref 12–46)
Neutro Abs: 13.7 10*3/uL — ABNORMAL HIGH (ref 1.7–7.7)

## 2011-06-28 LAB — COMPREHENSIVE METABOLIC PANEL
ALT: 34 U/L (ref 0–35)
Alkaline Phosphatase: 81 U/L (ref 39–117)
BUN: 5 mg/dL — ABNORMAL LOW (ref 6–23)
CO2: 22 mEq/L (ref 19–32)
Chloride: 102 mEq/L (ref 96–112)
GFR calc Af Amer: >90 mL/min (ref >90)
GFR calc non Af Amer: >90 mL/min (ref >90)
Glucose, Bld: 134 mg/dL — ABNORMAL HIGH (ref 70–99)
Potassium: 3.5 mEq/L (ref 3.5–5.1)
Sodium: 137 mEq/L (ref 135–145)
Total Bilirubin: 0.3 mg/dL (ref 0.3–1.2)
Total Protein: 8 g/dL (ref 6.0–8.3)

## 2011-06-28 LAB — TYPE AND SCREEN

## 2011-06-28 MED ORDER — PANTOPRAZOLE SODIUM 40 MG PO TBEC
40.0000 mg | DELAYED_RELEASE_TABLET | Freq: Every day | ORAL | Status: DC
  Administered 2011-06-28 – 2011-06-30 (×3): 40 mg via ORAL
  Filled 2011-06-28 (×3): qty 1

## 2011-06-28 NOTE — Progress Notes (Signed)
1 Day Post-Op  Subjective: Still wheezing a lot this am.  Hungry and abd pain significantly better.  Objective: Vital signs in last 24 hours: Temp:  [97.4 F (36.3 C)-99 F (37.2 C)] 99 F (37.2 C) (12/16 0620) Pulse Rate:  [74-93] 88  (12/16 0620) Resp:  [16-18] 16  (12/16 0620) BP: (124-151)/(77-94) 124/77 mmHg (12/16 0620) SpO2:  [92 %-98 %] 92 % (12/16 0747) Weight:  [113.399 kg (250 lb)] 250 lb (113.399 kg) (12/15 1700) Last BM Date: 06/26/11  Intake/Output from previous day: 12/15 0701 - 12/16 0700 In: 1024.2 [P.O.:240; I.V.:621.7; Blood:162.5] Out: -  Intake/Output this shift:    Resp: wheezes bilaterally Cardio: regular rate and rhythm GI: soft, non-tender; bowel sounds normal; no masses,  no organomegaly  Lab Results:   Basename 06/28/11 0500 06/27/11 0630  WBC 15.4* 8.4  HGB 9.0* 7.1*  HCT 30.6* 26.4*  PLT 515* 445*   BMET  Basename 06/28/11 0500 06/27/11 0630  NA 137 138  K 3.5 3.9  CL 102 105  CO2 22 27  GLUCOSE 134* 91  BUN 5* 5*  CREATININE 0.74 0.97  CALCIUM 9.6 8.5     Studies/Results: Dg Chest 2 View  06/26/2011  *RADIOLOGY REPORT*  Clinical Data: Dizziness and weakness.  Upper respiratory infection.  CHEST - 2 VIEW  Comparison:  05/26/2011.  Findings: Trachea is midline.  Heart size normal.  Mild interstitial prominence appears unchanged from 05/26/2011.  No focal airspace consolidation or pleural fluid.  IMPRESSION: Mild interstitial prominence appears unchanged 05/26/2011 and is therefore difficult to exclude a viral process.  Original Report Authenticated By: Reyes Ivan, M.D.   US Abdomen Complete  06/26/2011  *RADIOLOGY REPORT*  Clinical Data:  35 year old with pain.  COMPLETE ABDOMINAL ULTRASOUND  Comparison:  Abdominal CT 06/26/2011  Findings:  Gallbladder:  There are multiple echogenic stones in the gallbladder that demonstrate posterior acoustic shadowing.  The gallbladder is not significantly distended but there is mild wall  thickening measuring up to 4 mm.  Largest stones measure 5 mm. There is not a sonographic Murphy's sign.  Common bile duct:  Measures 4 mm in diameter.  Liver:  No focal lesion identified.  Within normal limits in parenchymal echogenicity.  IVC:  Appears normal.  Pancreas:  No focal abnormality seen.  Spleen:  Measures 6.8 cm in length.  Right Kidney:  Measures 12.2 cm in length with normal echotexture. Negative for hydronephrosis.  Left Kidney:  Measures 11.6 cm in length without hydronephrosis.  Abdominal aorta:  No aneurysm identified.  Other findings:  There is a soft tissue structure in the lower abdomen and this is compatible with a uterine fibroid based on the recent CT findings.  IMPRESSION: Cholelithiasis.  There is mild gallbladder wall thickening but the gallbladder is likely contracted.  No evidence for a sonographic Murphy's sign.  Soft tissue structure in the lower abdomen is compatible with an enlarged uterus and fibroid.  Original Report Authenticated By: Richarda Overlie, M.D.    Anti-infectives: Anti-infectives     Start     Dose/Rate Route Frequency Ordered Stop   06/26/11 2200   piperacillin-tazobactam (ZOSYN) IVPB 3.375 g        3.375 g 12.5 mL/hr over 240 Minutes Intravenous 3 times per day 06/26/11 2101            Assessment/Plan: IDA  Morbid obesity  Asthma  URI  Symptomatic cholelithiasis, probable acute cholecystitis  Uterine fibroids   Abd pain improved on Zosyn, but still  wheezing. Just started Solumedrol yesterday. Will plan to let her eat today. Likely proceed with cholecystectomy with IOC tomorrow.    LOS: 2 days   Laurey Salser,PA-C Pager [phone] General Trauma Pager [phone]

## 2011-06-28 NOTE — Progress Notes (Signed)
[name]  UUV:[id number]  DOB: December 18, 1975  DOA: 06/26/2011  Subjective: Wheezing has improved from yesterday, patient hungry and wants to eat  Objective: Weight change:   Intake/Output Summary (Last 24 hours) at 06/28/11 1032 Last data filed at 06/28/11 0700  Gross per 24 hour  Intake 1024.17 ml  Output      0 ml  Net 1024.17 ml   Blood pressure 124/77, pulse 88, temperature 99 F (37.2 C), temperature source Oral, resp. rate 16, height 5\' 2"  (1.575 m), weight 113.399 kg (250 lb), SpO2 92.00%.  Physical Exam: General: Alert and awake, oriented x3, not in any acute distress. HEENT: anicteric sclera, pupils reactive to light and accommodation, EOMI CVS: S1-S2 clear, no murmur rubs or gallops Chest: Bilateral wheezing, improving from yesterday  Abdomen: soft right upper quadrant tenderness, nondistended, normal bowel sounds, no organomegaly Extremities: no cyanosis, clubbing or edema noted bilaterally Neuro: Cranial nerves II-XII intact, no focal neurological deficits  Lab Results: Basic Metabolic Panel:  Lab 06/28/11 4742 06/27/11 0630  NA 137 138  K 3.5 3.9  CL 102 105  CO2 22 27  GLUCOSE 134* 91  BUN 5* 5*  CREATININE 0.74 0.97  CALCIUM 9.6 8.5  MG -- --  PHOS -- --   Liver Function Tests:  Lab 06/28/11 0500 06/27/11 0630  AST 30 43*  ALT 34 37*  ALKPHOS 81 75  BILITOT 0.3 0.3  PROT 8.0 6.7  ALBUMIN 3.3* 2.8*    Lab 06/27/11 0630 06/26/11 0455  LIPASE 20 33  AMYLASE -- --   CBC:  Lab 06/28/11 0500 06/27/11 0630  WBC 15.4* 8.4  NEUTROABS 13.7* --  HGB 9.0* 7.1*  HCT 30.6* 26.4*  MCV 59.1* 58.3*  PLT 515* 445*    Studies/Results: Dg Chest 2 View  06/26/2011  *RADIOLOGY REPORT*  Clinical Data: Dizziness and weakness.  Upper respiratory infection.  CHEST - 2 VIEW  Comparison:  05/26/2011.  Findings: Trachea is midline.  Heart size normal.  Mild interstitial prominence appears unchanged from 05/26/2011.  No focal airspace consolidation or  pleural fluid.  IMPRESSION: Mild interstitial prominence appears unchanged 05/26/2011 and is therefore difficult to exclude a viral process.  Original Report Authenticated By: Reyes Ivan, M.D.   US Abdomen Complete  06/26/2011  *RADIOLOGY REPORT*  Clinical Data:  35 year old with pain.  COMPLETE ABDOMINAL ULTRASOUND  Comparison:  Abdominal CT 06/26/2011  Findings:  Gallbladder:  There are multiple echogenic stones in the gallbladder that demonstrate posterior acoustic shadowing.  The gallbladder is not significantly distended but there is mild wall thickening measuring up to 4 mm.  Largest stones measure 5 mm. There is not a sonographic Murphy's sign.  Common bile duct:  Measures 4 mm in diameter.  Liver:  No focal lesion identified.  Within normal limits in parenchymal echogenicity.  IVC:  Appears normal.  Pancreas:  No focal abnormality seen.  Spleen:  Measures 6.8 cm in length.  Right Kidney:  Measures 12.2 cm in length with normal echotexture. Negative for hydronephrosis.  Left Kidney:  Measures 11.6 cm in length without hydronephrosis.  Abdominal aorta:  No aneurysm identified.  Other findings:  There is a soft tissue structure in the lower abdomen and this is compatible with a uterine fibroid based on the recent CT findings.  IMPRESSION: Cholelithiasis.  There is mild gallbladder wall thickening but the gallbladder is likely contracted.  No evidence for a sonographic Murphy's sign.  Soft tissue structure in the lower abdomen is compatible with  an enlarged uterus and fibroid.  Original Report Authenticated By: Richarda Overlie, M.D.   Ct Abdomen Pelvis W Contrast  06/26/2011  *RADIOLOGY REPORT*  Clinical Data: Abdominal pain, nausea, vomiting  CT ABDOMEN AND PELVIS WITH CONTRAST  Technique:  Multidetector CT imaging of the abdomen and pelvis was performed following the standard protocol during bolus administration of intravenous contrast.  Contrast:  100 ml Omnipaque-300  Comparison: Chest x-ray of  05/26/2011  Findings: The lung bases are clear.  There is some thickening of the mucosa of the distal esophagus which may be due to esophagitis or possibly a small hiatal hernia.  The liver enhances with no focal abnormality and no ductal dilatation is seen.  Small gallstones layer within the gallbladder.  The gallbladder does not appear to be distended.  However, there do appear to be common bile duct calculi within the distal common bile duct without definite obstruction currently.  These may be intermittently obstructing calculi.  The pancreas is normal in size and the pancreatic duct is not dilated.  The adrenal glands and spleen are unremarkable.  The kidneys enhance with no calculus or mass and no hydronephrosis is seen.  The abdominal aorta is normal in caliber.  There is enlarged lobular uterus present consistent with multiple uterine fibroids.  The largest fibroid is within the uterine fundus measuring 8.7 x 12.9 x 9.0 cm.  The urinary bladder is unremarkable.  No free fluid is seen within the pelvis.  Probable small right ovarian cyst is noted.  No abnormality of the colon is seen.  No bony abnormality is seen.  IMPRESSION:  1.  Gallstones and apparent nonobstructing distal common bile duct calculi which may be intermittently obstructing. 2.  Enlarged multi fibroid uterus.  I discussed the findings of this study with Dr. Linwood Dibbles at 9:02 a.m. on 06/26/2011.  Original Report Authenticated By: Juline Patch, M.D.    Medications: Scheduled Meds:   . albuterol  2.5 mg Nebulization Q6H  . benzonatate  100 mg Oral TID  . budesonide-formoterol  2 puff Inhalation BID  . guaiFENesin  600 mg Oral BID  . HYDROcodone-homatropine  5 mL Oral Q12H  . ipratropium  0.5 mg Nebulization Q6H  . methylPREDNISolone (SOLU-MEDROL) injection  125 mg Intravenous Once  . methylPREDNISolone (SOLU-MEDROL) injection  60 mg Intravenous Q6H  . montelukast  10 mg Oral QHS  . pantoprazole (PROTONIX) IV  40 mg Intravenous  QHS  . piperacillin-tazobactam (ZOSYN)  IV  3.375 g Intravenous Q8H  . DISCONTD: budesonide-formoterol  2 puff Inhalation BID   Continuous Infusions:   . dextrose 5 % and 0.45 % NaCl with KCl 20 mEq/L 0  (06/28/11 0513)  . DISCONTD: sodium chloride Stopped (06/26/11 1954)     Assessment/Plan:  Principal problem:  1. abdominal pain with acute cholecystitis/acute choledocholithiasis:  - Management per primary service (CCS), agree that patient will need pulmonary status tuned up before the surgery.   Active Problems:  2. acute on chronic asthma exacerbation: Slowly improving - Flu PCR still pending  - Continue current management with scheduled albuterol and Atrovent nebulizer breathing treatments, and IV Solu-Medrol. Patient is already on Zosyn, and at the time of discharge, can DC on Augmentin or Avelox for a week.  - Continue Symbicort, Singulair, Tessalon Perles and Hycodan cough syrup.  - Please place the outpatient referral to [facility] at the time of discharge per patient's request.  - Likely will be okay for the surgery tomorrow.  3. anemia with  a baseline hemoglobin around 8 - I was called last night regarding the hemoglobin of 7.1, patient has a history of uterine fibroids and currently on her menstrual cycle. FOBT was positive. Gave her one unit of packed RBCs, hemoglobin stable. Counseled patient to followup with her OB-GYN after discharge.   I will follow in a.m.   LOS: 2 days   Amrit Cress 06/28/2011, 10:32 AM

## 2011-06-28 NOTE — Progress Notes (Signed)
Recheck respiratory status in AM. Hope for lap chole tomorrow.

## 2011-06-29 ENCOUNTER — Encounter (HOSPITAL_COMMUNITY): Payer: Self-pay | Admitting: Anesthesiology

## 2011-06-29 ENCOUNTER — Other Ambulatory Visit (INDEPENDENT_AMBULATORY_CARE_PROVIDER_SITE_OTHER): Payer: Self-pay | Admitting: General Surgery

## 2011-06-29 ENCOUNTER — Inpatient Hospital Stay (HOSPITAL_COMMUNITY): Payer: Medicare Other | Admitting: Anesthesiology

## 2011-06-29 ENCOUNTER — Inpatient Hospital Stay (HOSPITAL_COMMUNITY): Payer: Medicare Other

## 2011-06-29 ENCOUNTER — Encounter (HOSPITAL_COMMUNITY): Admission: EM | Disposition: A | Discharge status: Home / Self Care | Payer: Self-pay | Source: Home / Self Care

## 2011-06-29 DIAGNOSIS — K8010 Calculus of gallbladder with chronic cholecystitis without obstruction: Secondary | ICD-10-CM

## 2011-06-29 DIAGNOSIS — K819 Cholecystitis, unspecified: Secondary | ICD-10-CM | POA: Diagnosis present

## 2011-06-29 HISTORY — PX: CHOLECYSTECTOMY: SHX55

## 2011-06-29 SURGERY — LAPAROSCOPIC CHOLECYSTECTOMY WITH INTRAOPERATIVE CHOLANGIOGRAM
Anesthesia: General | Site: Abdomen | Wound class: Contaminated

## 2011-06-29 MED ORDER — SODIUM CHLORIDE 0.9 % IV SOLN
Freq: Once | INTRAVENOUS | Status: AC
  Administered 2011-06-29: 23:00:00 via INTRAVENOUS

## 2011-06-29 MED ORDER — HYDROMORPHONE HCL PF 1 MG/ML IJ SOLN
INTRAMUSCULAR | Status: AC
  Administered 2011-06-30: 1 mg via INTRAVENOUS
  Filled 2011-06-29: qty 1

## 2011-06-29 MED ORDER — ONDANSETRON HCL 4 MG/2ML IJ SOLN
INTRAMUSCULAR | Status: DC | PRN
  Administered 2011-06-29: 4 mg via INTRAVENOUS

## 2011-06-29 MED ORDER — SODIUM CHLORIDE 0.9 % IR SOLN
Status: DC | PRN
  Administered 2011-06-29: 1000 mL

## 2011-06-29 MED ORDER — MORPHINE SULFATE 2 MG/ML IJ SOLN: 0.0500 mg/kg | INTRAMUSCULAR | Status: DC | PRN

## 2011-06-29 MED ORDER — ONDANSETRON HCL 4 MG PO TABS: 4.0000 mg | ORAL_TABLET | Freq: Four times a day (QID) | ORAL | Status: DC | PRN

## 2011-06-29 MED ORDER — METHYLPREDNISOLONE SODIUM SUCC 40 MG IJ SOLR
40.0000 mg | Freq: Three times a day (TID) | INTRAMUSCULAR | Status: DC
Start: 2011-06-29 — End: 2011-07-01
  Administered 2011-06-29 – 2011-07-01 (×5): 40 mg via INTRAVENOUS
  Filled 2011-06-29 (×9): qty 1

## 2011-06-29 MED ORDER — HYDROMORPHONE HCL PF 1 MG/ML IJ SOLN: 0.2500 mg | INTRAMUSCULAR | Status: DC | PRN

## 2011-06-29 MED ORDER — ONDANSETRON HCL 4 MG/2ML IJ SOLN
4.0000 mg | Freq: Four times a day (QID) | INTRAMUSCULAR | Status: DC | PRN
  Administered 2011-06-30: 4 mg via INTRAVENOUS
  Filled 2011-06-29: qty 2

## 2011-06-29 MED ORDER — MEPERIDINE HCL 25 MG/ML IJ SOLN: 6.2500 mg | INTRAMUSCULAR | Status: DC | PRN

## 2011-06-29 MED ORDER — LACTATED RINGERS IV SOLN
INTRAVENOUS | Status: DC | PRN
  Administered 2011-06-29: 1000 mL
  Administered 2011-06-29 (×2): via INTRAVENOUS

## 2011-06-29 MED ORDER — BUPIVACAINE-EPINEPHRINE 0.25% -1:200000 IJ SOLN
INTRAMUSCULAR | Status: DC | PRN
  Administered 2011-06-29: 28 mL

## 2011-06-29 MED ORDER — IPRATROPIUM BROMIDE 0.02 % IN SOLN
0.5000 mg | Freq: Two times a day (BID) | RESPIRATORY_TRACT | Status: DC
  Administered 2011-06-30 – 2011-07-04 (×9): 0.5 mg via RESPIRATORY_TRACT
  Filled 2011-06-29 (×9): qty 2.5

## 2011-06-29 MED ORDER — LACTATED RINGERS IV SOLN: INTRAVENOUS | Status: DC

## 2011-06-29 MED ORDER — PROPOFOL 10 MG/ML IV EMUL
INTRAVENOUS | Status: DC | PRN
  Administered 2011-06-29: 200 mg via INTRAVENOUS

## 2011-06-29 MED ORDER — ALBUTEROL SULFATE (5 MG/ML) 0.5% IN NEBU
2.5000 mg | INHALATION_SOLUTION | Freq: Two times a day (BID) | RESPIRATORY_TRACT | Status: DC
  Administered 2011-06-30 – 2011-07-04 (×9): 2.5 mg via RESPIRATORY_TRACT
  Filled 2011-06-29 (×7): qty 0.5

## 2011-06-29 MED ORDER — 0.9 % SODIUM CHLORIDE (POUR BTL) OPTIME
TOPICAL | Status: DC | PRN
  Administered 2011-06-29: 1000 mL

## 2011-06-29 MED ORDER — SODIUM CHLORIDE 0.9 % IR SOLN: Status: DC | PRN

## 2011-06-29 MED ORDER — HYDROMORPHONE HCL PF 1 MG/ML IJ SOLN
0.2500 mg | INTRAMUSCULAR | Status: DC | PRN
  Administered 2011-06-29 (×4): 0.5 mg via INTRAVENOUS

## 2011-06-29 MED ORDER — MIDAZOLAM HCL 5 MG/5ML IJ SOLN
INTRAMUSCULAR | Status: DC | PRN
  Administered 2011-06-29: 2 mg via INTRAVENOUS

## 2011-06-29 MED ORDER — ROCURONIUM BROMIDE 100 MG/10ML IV SOLN
INTRAVENOUS | Status: DC | PRN
  Administered 2011-06-29: 50 mg via INTRAVENOUS

## 2011-06-29 MED ORDER — GLYCOPYRROLATE 0.2 MG/ML IJ SOLN
INTRAMUSCULAR | Status: DC | PRN
  Administered 2011-06-29: .4 mg via INTRAVENOUS

## 2011-06-29 MED ORDER — FENTANYL CITRATE 0.05 MG/ML IJ SOLN
INTRAMUSCULAR | Status: DC | PRN
  Administered 2011-06-29: 250 ug via INTRAVENOUS

## 2011-06-29 MED ORDER — MORPHINE SULFATE 2 MG/ML IJ SOLN: 4.0000 mg | INTRAMUSCULAR | Status: DC | PRN

## 2011-06-29 MED ORDER — NEOSTIGMINE METHYLSULFATE 1 MG/ML IJ SOLN
INTRAMUSCULAR | Status: DC | PRN
  Administered 2011-06-29: 3 mg via INTRAVENOUS

## 2011-06-29 MED ORDER — ONDANSETRON HCL 4 MG/2ML IJ SOLN: 4.0000 mg | Freq: Once | INTRAMUSCULAR | Status: AC | PRN

## 2011-06-29 MED ORDER — IOHEXOL 300 MG/ML  SOLN
INTRAMUSCULAR | Status: DC | PRN
  Administered 2011-06-29: 13 mL via INTRAVENOUS

## 2011-06-29 SURGICAL SUPPLY — 48 items
ADH SKN CLS APL DERMABOND .7 (GAUZE/BANDAGES/DRESSINGS)
ADH SKN CLS LQ APL DERMABOND (GAUZE/BANDAGES/DRESSINGS) ×1
APPLIER CLIP ROT 10 11.4 M/L (STAPLE) ×2
APR CLP MED LRG 11.4X10 (STAPLE) ×1
BAG SPEC RTRVL LRG 6X4 10 (ENDOMECHANICALS) ×1
BLADE SURG ROTATE 9660 (MISCELLANEOUS) IMPLANT
CANISTER SUCTION 2500CC (MISCELLANEOUS) ×2 IMPLANT
CATH REDDICK CHOLANGI 4FR 50CM (CATHETERS) ×2 IMPLANT
CHLORAPREP W/TINT 26ML (MISCELLANEOUS) ×2 IMPLANT
CLIP APPLIE ROT 10 11.4 M/L (STAPLE) ×1 IMPLANT
CLOTH BEACON ORANGE TIMEOUT ST (SAFETY) ×2 IMPLANT
COVER MAYO STAND STRL (DRAPES) ×2 IMPLANT
COVER SURGICAL LIGHT HANDLE (MISCELLANEOUS) ×2 IMPLANT
DECANTER SPIKE VIAL GLASS SM (MISCELLANEOUS) ×2 IMPLANT
DERMABOND ADHESIVE PROPEN (GAUZE/BANDAGES/DRESSINGS) ×1
DERMABOND ADVANCED (GAUZE/BANDAGES/DRESSINGS)
DERMABOND ADVANCED .7 DNX12 (GAUZE/BANDAGES/DRESSINGS) ×1 IMPLANT
DERMABOND ADVANCED .7 DNX6 (GAUZE/BANDAGES/DRESSINGS) IMPLANT
DRAPE C-ARM 42X72 X-RAY (DRAPES) ×2 IMPLANT
DRAPE UTILITY 15X26 W/TAPE STR (DRAPE) ×4 IMPLANT
ELECT REM PT RETURN 9FT ADLT (ELECTROSURGICAL) ×2
ELECTRODE REM PT RTRN 9FT ADLT (ELECTROSURGICAL) ×1 IMPLANT
GLOVE BIO SURGEON STRL SZ 6.5 (GLOVE) ×1 IMPLANT
GLOVE BIO SURGEON STRL SZ7.5 (GLOVE) ×4 IMPLANT
GLOVE BIOGEL PI IND STRL 6.5 (GLOVE) IMPLANT
GLOVE BIOGEL PI IND STRL 7.5 (GLOVE) IMPLANT
GLOVE BIOGEL PI INDICATOR 6.5 (GLOVE) ×2
GLOVE BIOGEL PI INDICATOR 7.5 (GLOVE) ×1
GLOVE ECLIPSE 6.5 STRL STRAW (GLOVE) ×1 IMPLANT
GOWN STRL NON-REIN LRG LVL3 (GOWN DISPOSABLE) ×9 IMPLANT
IV CATH 14GX2 1/4 (CATHETERS) ×2 IMPLANT
KIT BASIN OR (CUSTOM PROCEDURE TRAY) ×2 IMPLANT
KIT ROOM TURNOVER OR (KITS) ×2 IMPLANT
NS IRRIG 1000ML POUR BTL (IV SOLUTION) ×2 IMPLANT
PAD ARMBOARD 7.5X6 YLW CONV (MISCELLANEOUS) ×2 IMPLANT
POUCH SPECIMEN RETRIEVAL 10MM (ENDOMECHANICALS) ×2 IMPLANT
SCISSORS LAP 5X35 DISP (ENDOMECHANICALS) IMPLANT
SET IRRIG TUBING LAPAROSCOPIC (IRRIGATION / IRRIGATOR) ×2 IMPLANT
SLEEVE ENDOPATH XCEL 5M (ENDOMECHANICALS) ×2 IMPLANT
SPECIMEN JAR SMALL (MISCELLANEOUS) ×2 IMPLANT
SUT MNCRL AB 4-0 PS2 18 (SUTURE) ×2 IMPLANT
SYR 20CC LL (SYRINGE) ×1 IMPLANT
TOWEL OR 17X24 6PK STRL BLUE (TOWEL DISPOSABLE) ×2 IMPLANT
TOWEL OR 17X26 10 PK STRL BLUE (TOWEL DISPOSABLE) ×2 IMPLANT
TRAY LAPAROSCOPIC (CUSTOM PROCEDURE TRAY) ×2 IMPLANT
TROCAR XCEL BLUNT TIP 100MML (ENDOMECHANICALS) ×2 IMPLANT
TROCAR XCEL NON-BLD 11X100MML (ENDOMECHANICALS) ×2 IMPLANT
TROCAR XCEL NON-BLD 5MMX100MML (ENDOMECHANICALS) ×2 IMPLANT

## 2011-06-29 NOTE — Anesthesia Preprocedure Evaluation (Addendum)
Anesthesia Evaluation  Patient identified by MRN, date of birth, ID band Patient awake    Reviewed: Allergy & Precautions, H&P , NPO status , Patient's Chart, lab work & pertinent test results  Airway Mallampati: I TM Distance: >3 FB Neck ROM: Full    Dental No notable dental hx. (+) Dental Advisory Given   Pulmonary asthma ,    Pulmonary exam normal       Cardiovascular     Neuro/Psych    GI/Hepatic   Endo/Other  Morbid obesity  Renal/GU      Musculoskeletal   Abdominal   Peds  Hematology   Anesthesia Other Findings   Reproductive/Obstetrics                          Anesthesia Physical Anesthesia Plan  ASA: III  Anesthesia Plan: General   Post-op Pain Management:    Induction: Intravenous  Airway Management Planned: Oral ETT  Additional Equipment:   Intra-op Plan:   Post-operative Plan: Extubation in OR and Possible Post-op intubation/ventilation  Informed Consent:   Dental advisory given  Plan Discussed with: CRNA, Anesthesiologist and Surgeon  Anesthesia Plan Comments:         Anesthesia Quick Evaluation

## 2011-06-29 NOTE — Preoperative (Signed)
Beta Blockers   Reason not to administer Beta Blockers:Not Applicable 

## 2011-06-29 NOTE — Progress Notes (Signed)
2 Days Post-Op  Subjective: Pt feeling better. No SOB or wheezing. States abd pain starting to come back, but no N/V  Objective: Vital signs in last 24 hours: Temp:  [97.9 [name] (36.6 C)-98.7 [name] (37.1 C)] 97.9 [name] (36.6 C) (12/17 0605) Pulse Rate:  [73-106] 73  (12/17 0605) Resp:  [16-18] 16  (12/17 0605) BP: (122-132)/(73-76) 122/73 mmHg (12/17 0605) SpO2:  [92 %-98 %] 94 % (12/17 0605) Last BM Date: 06/26/11  Intake/Output this shift:    Physical Exam: BP 122/73  Pulse 73  Temp(Src) 97.9 [name] (36.6 C) (Oral)  Resp 16  Ht 5' 2" (1.575 m)  Wt 250 lb (113.399 kg)  BMI 45.73 kg/m2  SpO2 94% Lungs: clear Bilat, no wheezing Abdomen: soft, ND, mild tenderness RUQ  Labs: CBC  Basename 06/28/11 0500 06/27/11 0630  WBC 15.4* 8.4  HGB 9.0* 7.1*  HCT 30.6* 26.4*  PLT 515* 445*   BMET  Basename 06/28/11 0500 06/27/11 0630  NA 137 138  K 3.5 3.9  CL 102 105  CO2 22 27  GLUCOSE 134* 91  BUN 5* 5*  CREATININE 0.74 0.97  CALCIUM 9.6 8.5   LFT  Basename 06/28/11 0500 06/27/11 0630  PROT 8.0 --  ALBUMIN 3.3* --  AST 30 --  ALT 34 --  ALKPHOS 81 --  BILITOT 0.3 --  BILIDIR -- --  IBILI -- --  LIPASE -- 20   PT/INR No results found for this basename: LABPROT:2,INR:2 in the last 72 hours ABG No results found for this basename: PHART:2,PCO2:2,PO2:2,HCO3:2 in the last 72 hours  Studies/Results: No results found.  Assessment: Principal Problem:  *Cholecystitis  Plan: To OR today.  LOS: 3 days    [name] 06/29/2011    

## 2011-06-29 NOTE — Consult Note (Signed)
Dr. Carolynne Edouard of the surgical service asked to see this pleasant 35 year old African American female because of choledocholithiasis.  PCP:  Dr. Orpah Cobb   The patient underwent a laparoscopic cholecystectomy today, at which time an intraoperative cholangiography showed a stone which was unable to be removed by the balloon tipped catheter.  The patient had presented to the emergency room several days ago with abdominal symptoms, at which time liver chemistries were slightly elevated, ultrasound showed multiple gallstones, but the common bile duct had a normal diameter of 4 mm. Her CT scan, however, had shown apparent stones within the distal common duct.  Past medical history: Stated allergy to prednisone. Outpatient medications albuterol when necessary, budesonide inhaler twice a day, and Singulair tablet once daily.  Operations: Prior surgery for ectopic pregnancy and the above-mentioned laparoscopic cholecystectomy today  Medical illnesses: Asthma, obesity  Habits: Nonsmoker nondrinker  Family history: Negative for GI tract illnesses  Social history: Married, lives with her mother. Her husband is a Naval architect who is on the road a lot. They've recently adopted a child.   Review of systems:  Occasional diet related heartburn. Occasional transient dysphagia symptoms, without food impactions. No problem with chronic abdominal pain or nausea. No problem with constipation, diarrhea, or rectal bleeding. She has asthma but it does not sound as though this has been very problematic for her. No urinary symptoms apart from recent stress incontinence.  Physical examination: A pleasant, overweight African American female. Coherent and appropriate. Anicteric, no overt pallor. Chest clear, heart normal. Abdomen quiet, somewhat adipose, with incisional tenderness but no diffuse tenderness.  Labs: Admission hemoglobin 8.2 with an MCV of 58, platelets 446,000. Following transfusion of one unit of packed  red cells, hemoglobin is 9.0. Liver chemistries were slightly elevated on admission with transaminases in the 40-80 range, but they were normal yesterday morning. White count 15,400 yesterday.  Impression:  1. Choledocholithiasis, characterized by CT findings, intraoperative cholangiogram, and transient rise in liver chemistries. Note the lipase was normal, so there is no evidence of gallstone pancreatitis or cholangitis. 2. Microcytic anemia. The patient indicates that she was found to have a low iron level, and is in the process of being treated with iron supplements through her primary physician, Dr. Algie Coffer. However, she has not actually started the iron yet. Stool studies in the hospital are pending.  Recommendation:  1. ERCP with sphincterotomy and stone extraction tomorrow. The nature and purpose of the procedure were reviewed with the patient, with the aid of a diagram, as well as the risks including cardiopulmonary problems, pancreatitis which can sometimes be life-threatening, roughly 3% chance of some sort of pancreatitis occurring as a consequence of the procedure, bleeding, perforation with need for emergency surgery, infection, and potential for unsuccessful procedure. The patient is agreeable to proceed.  2. Anemia followup per her primary physician. It is noted that the patient has not been on ulcerogenic medications, has not had heavy menses, and has not donated blood to the [employer]. GI tract pathology would seem unlikely in this age group but the patient was advised of the need for followup with this with her primary physician.  Florencia Reasons, M.D. [phone]

## 2011-06-29 NOTE — Progress Notes (Signed)
TALLULAH HOSMAN  ZOX:[id number]  DOB: [birth year]  DOA: 06/26/2011  Subjective: Wheezing has improved from yesterday, to OR today  Objective: Weight change:   Intake/Output Summary (Last 24 hours) at 06/29/11 1014 Last data filed at 06/29/11 0700  Gross per 24 hour  Intake   1380 ml  Output      0 ml  Net   1380 ml   Blood pressure 122/73, pulse 73, temperature 97.9 F (36.6 C), temperature source Oral, resp. rate 16, height 5\' 2"  (1.575 m), weight 113.399 kg (250 lb), SpO2 98.00%.  Physical Exam: General: Alert and awake, oriented x3, not in any acute distress. HEENT: anicteric sclera, pupils reactive to light and accommodation, EOMI CVS: S1-S2 clear, no murmur rubs or gallops Chest: Clear to auscultation bilaterally  Abdomen: soft right upper quadrant tenderness, nondistended, normal bowel sounds, no organomegaly Extremities: no cyanosis, clubbing or edema noted bilaterally Neuro: Cranial nerves II-XII intact, no focal neurological deficits  Lab Results: Basic Metabolic Panel:  Lab 06/28/11 8119 06/27/11 0630  NA 137 138  K 3.5 3.9  CL 102 105  CO2 22 27  GLUCOSE 134* 91  BUN 5* 5*  CREATININE 0.74 0.97  CALCIUM 9.6 8.5  MG -- --  PHOS -- --   Liver Function Tests:  Lab 06/28/11 0500 06/27/11 0630  AST 30 43*  ALT 34 37*  ALKPHOS 81 75  BILITOT 0.3 0.3  PROT 8.0 6.7  ALBUMIN 3.3* 2.8*    Lab 06/27/11 0630 06/26/11 0455  LIPASE 20 33  AMYLASE -- --   CBC:  Lab 06/28/11 0500 06/27/11 0630  WBC 15.4* 8.4  NEUTROABS 13.7* --  HGB 9.0* 7.1*  HCT 30.6* 26.4*  MCV 59.1* 58.3*  PLT 515* 445*    Studies/Results: Dg Chest 2 View  06/26/2011  *RADIOLOGY REPORT*  Clinical Data: Dizziness and weakness.  Upper respiratory infection.  CHEST - 2 VIEW  Comparison:  05/26/2011.  Findings: Trachea is midline.  Heart size normal.  Mild interstitial prominence appears unchanged from 05/26/2011.  No focal airspace consolidation or pleural fluid.  IMPRESSION: Mild  interstitial prominence appears unchanged 05/26/2011 and is therefore difficult to exclude a viral process.  Original Report Authenticated By: Reyes Ivan, M.D.   US Abdomen Complete  06/26/2011  *RADIOLOGY REPORT*  Clinical Data:  35 year old with pain.  COMPLETE ABDOMINAL ULTRASOUND  Comparison:  Abdominal CT 06/26/2011  Findings:  Gallbladder:  There are multiple echogenic stones in the gallbladder that demonstrate posterior acoustic shadowing.  The gallbladder is not significantly distended but there is mild wall thickening measuring up to 4 mm.  Largest stones measure 5 mm. There is not a sonographic Murphy's sign.  Common bile duct:  Measures 4 mm in diameter.  Liver:  No focal lesion identified.  Within normal limits in parenchymal echogenicity.  IVC:  Appears normal.  Pancreas:  No focal abnormality seen.  Spleen:  Measures 6.8 cm in length.  Right Kidney:  Measures 12.2 cm in length with normal echotexture. Negative for hydronephrosis.  Left Kidney:  Measures 11.6 cm in length without hydronephrosis.  Abdominal aorta:  No aneurysm identified.  Other findings:  There is a soft tissue structure in the lower abdomen and this is compatible with a uterine fibroid based on the recent CT findings.  IMPRESSION: Cholelithiasis.  There is mild gallbladder wall thickening but the gallbladder is likely contracted.  No evidence for a sonographic Murphy's sign.  Soft tissue structure in the lower abdomen is compatible with  an enlarged uterus and fibroid.  Original Report Authenticated By: Richarda Overlie, M.D.   Ct Abdomen Pelvis W Contrast  06/26/2011  *RADIOLOGY REPORT*  Clinical Data: Abdominal pain, nausea, vomiting  CT ABDOMEN AND PELVIS WITH CONTRAST  Technique:  Multidetector CT imaging of the abdomen and pelvis was performed following the standard protocol during bolus administration of intravenous contrast.  Contrast:  100 ml Omnipaque-300  Comparison: Chest x-ray of 05/26/2011  Findings: The lung bases  are clear.  There is some thickening of the mucosa of the distal esophagus which may be due to esophagitis or possibly a small hiatal hernia.  The liver enhances with no focal abnormality and no ductal dilatation is seen.  Small gallstones layer within the gallbladder.  The gallbladder does not appear to be distended.  However, there do appear to be common bile duct calculi within the distal common bile duct without definite obstruction currently.  These may be intermittently obstructing calculi.  The pancreas is normal in size and the pancreatic duct is not dilated.  The adrenal glands and spleen are unremarkable.  The kidneys enhance with no calculus or mass and no hydronephrosis is seen.  The abdominal aorta is normal in caliber.  There is enlarged lobular uterus present consistent with multiple uterine fibroids.  The largest fibroid is within the uterine fundus measuring 8.7 x 12.9 x 9.0 cm.  The urinary bladder is unremarkable.  No free fluid is seen within the pelvis.  Probable small right ovarian cyst is noted.  No abnormality of the colon is seen.  No bony abnormality is seen.  IMPRESSION:  1.  Gallstones and apparent nonobstructing distal common bile duct calculi which may be intermittently obstructing. 2.  Enlarged multi fibroid uterus.  I discussed the findings of this study with Dr. Linwood Dibbles at 9:02 a.m. on 06/26/2011.  Original Report Authenticated By: Juline Patch, M.D.    Medications: Scheduled Meds:    . albuterol  2.5 mg Nebulization Q6H  . benzonatate  100 mg Oral TID  . budesonide-formoterol  2 puff Inhalation BID  . guaiFENesin  600 mg Oral BID  . HYDROcodone-homatropine  5 mL Oral Q12H  . ipratropium  0.5 mg Nebulization Q6H  . methylPREDNISolone (SOLU-MEDROL) injection  40 mg Intravenous Q8H  . montelukast  10 mg Oral QHS  . pantoprazole  40 mg Oral QHS  . piperacillin-tazobactam (ZOSYN)  IV  3.375 g Intravenous Q8H  . DISCONTD: methylPREDNISolone (SOLU-MEDROL) injection  60  mg Intravenous Q6H  . DISCONTD: pantoprazole (PROTONIX) IV  40 mg Intravenous QHS   Continuous Infusions:    . dextrose 5 % and 0.45 % NaCl with KCl 20 mEq/L 20 mL (06/28/11 1318)     Assessment/Plan:  Principal problem:  1. abdominal pain with acute cholecystitis/acute choledocholithiasis:  - Management per primary service (CCS), agree that patient will need pulmonary status tuned up before the surgery.   Active Problems:  2. acute on chronic asthma exacerbation: improving - Continue current management with scheduled albuterol and Atrovent nebulizer breathing treatments, and IV Solu-Medrol. Patient is already on Zosyn, and at the time of discharge, can DC on Augmentin or Avelox for a week.  - Continue Symbicort, Singulair, Tessalon Perles and Hycodan cough syrup.  - Please place the outpatient referral to [facility] at the time of discharge per patient's request.  FOR DISCHARGE:  1. I have placed home health order for nebulizer machine. Please give prescription for albuterol nebulizer solution, 2.5 mg 3 times a  day prn (i instructed her to continue TID for next few days) 2. Continue Symbicort 1 puff BID, singulair (HOME meds) 3. Give prescription for Medrol Pack, dose 1, avelox 400mg  daily for 5 days   4. Give prescription for Iron 325mg  daily 5. I made her appt with [facility], Dr. Sherene Sires on December 24, Monday at 11 AM. Patient will have to call the office to reschedule if she cannot make it.  3. anemia with a baseline hemoglobin around 8 - S/P one unit of packed RBCs, hemoglobin stable. Counseled patient to followup with her OB-GYN after discharge.    LOS: 3 days   RAI,RIPUDEEP 06/29/2011, 10:14 AM

## 2011-06-29 NOTE — Progress Notes (Signed)
Report given to [facility] as caregiver.

## 2011-06-29 NOTE — Anesthesia Procedure Notes (Addendum)
Procedure Name: Intubation Date/Time: 06/29/2011 12:46 PM Performed by: Rosita Fire Pre-anesthesia Checklist: Emergency Drugs available, Patient identified, Suction available and Patient being monitored Patient Re-evaluated:Patient Re-evaluated prior to inductionOxygen Delivery Method: Circle System Utilized Preoxygenation: Pre-oxygenation with 100% oxygen Intubation Type: IV induction Ventilation: Mask ventilation without difficulty and Oral airway inserted - appropriate to patient size Grade View: Grade I Tube type: Oral Tube size: 7.5 mm Number of attempts: 1 Placement Confirmation: ETT inserted through vocal cords under direct vision,  positive ETCO2 and breath sounds checked- equal and bilateral Secured at: 22 cm Tube secured with: Tape Dental Injury: Teeth and Oropharynx as per pre-operative assessment

## 2011-06-29 NOTE — Anesthesia Postprocedure Evaluation (Signed)
  Anesthesia Post-op Note  Patient: [name]  Procedure(s) Performed:  LAPAROSCOPIC CHOLECYSTECTOMY WITH INTRAOPERATIVE CHOLANGIOGRAM  Patient Location: PACU  Anesthesia Type: General  Level of Consciousness: sedated  Airway and Oxygen Therapy: Patient Spontanous Breathing and Patient connected to nasal cannula oxygen  Post-op Pain: mild  Post-op Assessment: Post-op Vital signs reviewed, Patient's Cardiovascular Status Stable, Respiratory Function Stable and Patent Airway  Post-op Vital Signs: Reviewed and stable  Complications: No apparent anesthesia complications

## 2011-06-29 NOTE — H&P (View-Only) (Signed)
2 Days Post-Op  Subjective: Pt feeling better. No SOB or wheezing. States abd pain starting to come back, but no N/V  Objective: Vital signs in last 24 hours: Temp:  [97.9 F (36.6 C)-98.7 F (37.1 C)] 97.9 F (36.6 C) (12/17 0605) Pulse Rate:  [73-106] 73  (12/17 0605) Resp:  [16-18] 16  (12/17 0605) BP: (122-132)/(73-76) 122/73 mmHg (12/17 0605) SpO2:  [92 %-98 %] 94 % (12/17 0605) Last BM Date: 06/26/11  Intake/Output this shift:    Physical Exam: BP 122/73  Pulse 73  Temp(Src) 97.9 F (36.6 C) (Oral)  Resp 16  Ht 5\' 2"  (1.575 m)  Wt 250 lb (113.399 kg)  BMI 45.73 kg/m2  SpO2 94% Lungs: clear Bilat, no wheezing Abdomen: soft, ND, mild tenderness RUQ  Labs: CBC  Basename 06/28/11 0500 06/27/11 0630  WBC 15.4* 8.4  HGB 9.0* 7.1*  HCT 30.6* 26.4*  PLT 515* 445*   BMET  Basename 06/28/11 0500 06/27/11 0630  NA 137 138  K 3.5 3.9  CL 102 105  CO2 22 27  GLUCOSE 134* 91  BUN 5* 5*  CREATININE 0.74 0.97  CALCIUM 9.6 8.5   LFT  Basename 06/28/11 0500 06/27/11 0630  PROT 8.0 --  ALBUMIN 3.3* --  AST 30 --  ALT 34 --  ALKPHOS 81 --  BILITOT 0.3 --  BILIDIR -- --  IBILI -- --  LIPASE -- 20   PT/INR No results found for this basename: LABPROT:2,INR:2 in the last 72 hours ABG No results found for this basename: PHART:2,PCO2:2,PO2:2,HCO3:2 in the last 72 hours  Studies/Results: No results found.  Assessment: Principal Problem:  *Cholecystitis  Plan: To OR today.  LOS: 3 days    Marianna Fuss 06/29/2011

## 2011-06-29 NOTE — Interval H&P Note (Signed)
History and Physical Interval Note:  06/29/2011 12:03 PM  [name]  has presented today for surgery, with the diagnosis of Cholecystitis  The various methods of treatment have been discussed with the patient and family. After consideration of risks, benefits and other options for treatment, the patient has consented to  Procedure(s): LAPAROSCOPIC CHOLECYSTECTOMY WITH INTRAOPERATIVE CHOLANGIOGRAM as a surgical intervention .  The patients' history has been reviewed, patient examined, no change in status, stable for surgery.  I have reviewed the patients' chart and labs.  Questions were answered to the patient's satisfaction.     TOTH III,Allard Lightsey S

## 2011-06-29 NOTE — Op Note (Addendum)
06/26/2011 - 06/29/2011  2:08 PM  PATIENT:  [name]  35 y.o. female  PRE-OPERATIVE DIAGNOSIS:  Cholecystitis  POST-OPERATIVE DIAGNOSIS:  Cholecystitis  PROCEDURE:  Procedure(s): LAPAROSCOPIC CHOLECYSTECTOMY WITH INTRAOPERATIVE CHOLANGIOGRAM  SURGEON:  Surgeon(s): Caleen Essex III, MD  PHYSICIAN ASSISTANT:   ASSISTANTS: none   ANESTHESIA:   general  EBL:  Total I/O In: 1000 [I.V.:1000] Out: 25 [Blood:25]  BLOOD ADMINISTERED:none  DRAINS: none   LOCAL MEDICATIONS USED:  MARCAINE 20CC  SPECIMEN:  Excision  DISPOSITION OF SPECIMEN:  PATHOLOGY  COUNTS:  YES  TOURNIQUET:  * No tourniquets in log *  DICTATION: .Dragon Dictation  Procedure: After informed consent was obtained the patient was brought to the operating room and placed in the supine position on the operating room table. After adequate induction of general anesthesia the patient's abdomen was prepped with ChloraPrep allowed to dry and draped in usual sterile manner. The area below the umbilicus was infiltrated with quarter percent  Marcaine. A small incision was made with a 15 blade knife. The incision was carried down through the subcutaneous tissue bluntly with a hemostat and [employer] retractors. The linea alba was identified. The linea alba was incised with a 15 blade knife and each side was grasped with Coker clamps. The preperitoneal space was then probed with a hemostat until the peritoneum was opened and access was gained to the abdominal cavity. A 0 Vicryl pursestring stitch was placed in the fascia surrounding the opening. A Hassan cannula was then placed through the opening and anchored in place with the previously placed Vicryl purse string stitch. The abdomen was insufflated with carbon dioxide without difficulty. A laparoscope was inserted through the [facility] cannula in the right upper quadrant was inspected. Next the epigastric region was infiltrated with % Marcaine. A small incision was made with a  15 blade knife. A 10 mm port was placed bluntly through this incision into the abdominal cavity under direct vision. Next 2 sites were chosen laterally on the right side of the abdomen for placement of 5 mm ports. Each of these areas was infiltrated with quarter percent Marcaine. Small stab incisions were made with a 15 blade knife. 5 mm ports were then placed bluntly through these incisions into the abdominal cavity under direct vision without difficulty. A blunt grasper was placed through the lateralmost 5 mm port and used to grasp the dome of the gallbladder and elevated anteriorly and superiorly. Another blunt grasper was placed through the other 5 mm port and used to retract the body and neck of the gallbladder. A dissector was placed through the epigastric port and using the electrocautery the peritoneal reflection at the gallbladder neck was opened. Blunt dissection was then carried out in this area until the gallbladder neck-cystic duct junction was readily identified and a good window was created. A single clip was placed on the gallbladder neck. A small  ductotomy was made just below the clip with laparoscopic scissors. A 14-gauge Angiocath was then placed through the anterior abdominal wall under direct vision. A Reddick cholangiogram catheter was then placed through the Angiocath and flushed. The catheter was then placed in the cystic duct and anchored in place with a clip. A cholangiogram was obtained that showed a filling defect in the distal common bile duct with no  emptying into the duodenum an adequate length on the cystic duct. An attempt was made to pass the reddick catheter. We were able to get it into the duodenum but could not retrieve  any stone or open up the distal duct. The anchoring clip and catheters were then removed from the patient. 3 clips were placed proximally on the cystic duct and the duct was divided between the 2 sets of clips. Posterior to this the cystic artery was identified  and again dissected bluntly in a circumferential manner until a good window  was created. 2 clips were placed proximally and one distally on the artery and the artery was divided between the 2 sets of clips. Next a laparoscopic hook cautery device was used to separate the gallbladder from the liver bed. Prior to completely detaching the gallbladder from the liver bed the liver bed was inspected and several small bleeding points were coagulated with the electrocautery until the area was completely hemostatic. The gallbladder was then detached the rest of it from the liver bed without difficulty. A laparoscopic bag was inserted through the epigastric port. The gallbladder was placed within the bag and the bag was sealed. A laparoscope was then moved to the epigastric port. The gallbladder grasper was placed through the [facility] cannula and used to grasp the opening of the bag. The bag with the gallbladder was then removed with the [facility] cannula through the infraumbilical port without difficulty. The fascial defect was then closed with the previously placed Vicryl pursestring stitch as well as with another figure-of-eight 0 Vicryl stitch. The liver bed was inspected again and found to be hemostatic. The abdomen was irrigated with copious amounts of saline until the effluent was clear. The ports were then removed under direct vision without difficulty and were found to be hemostatic. The gas was allowed to escape. The skin incisions were all closed with interrupted 4-0 Monocryl subcuticular stitches. Dermabond dressings were applied. The patient tolerated the procedure well. At the end of the case all needle sponge and instrument counts were correct. The patient was then awakened and taken to recovery in stable condition   PLAN OF CARE: Admit to inpatient   PATIENT DISPOSITION:  PACU - hemodynamically stable.   Delay start of Pharmacological VTE agent (>24hrs) due to surgical blood loss or risk of bleeding:   {YES/NO/NOT APPLICABLE:20182

## 2011-06-29 NOTE — Transfer of Care (Signed)
Immediate Anesthesia Transfer of Care Note  Patient: [name]  Procedure(s) Performed:  LAPAROSCOPIC CHOLECYSTECTOMY WITH INTRAOPERATIVE CHOLANGIOGRAM  Patient Location: PACU  Anesthesia Type: General  Level of Consciousness: awake and alert   Airway & Oxygen Therapy: Patient Spontanous Breathing and Patient connected to face mask oxygen  Post-op Assessment: Report given to PACU RN  Post vital signs: Reviewed and stable  Complications: No apparent anesthesia complications

## 2011-06-30 ENCOUNTER — Encounter (HOSPITAL_COMMUNITY): Payer: Self-pay | Admitting: *Deleted

## 2011-06-30 ENCOUNTER — Encounter (HOSPITAL_COMMUNITY): Admission: EM | Disposition: A | Discharge status: Home / Self Care | Payer: Self-pay | Source: Home / Self Care

## 2011-06-30 ENCOUNTER — Inpatient Hospital Stay (HOSPITAL_COMMUNITY): Payer: Medicare Other

## 2011-06-30 HISTORY — PX: ERCP: SHX5425

## 2011-06-30 LAB — CBC
HCT: 31.2 % — ABNORMAL LOW (ref 36.0–46.0)
Hemoglobin: 8.7 g/dL — ABNORMAL LOW (ref 12.0–15.0)
MCH: 16.8 pg — ABNORMAL LOW (ref 26.0–34.0)
MCHC: 27.9 g/dL — ABNORMAL LOW (ref 30.0–36.0)
RDW: 22.8 % — ABNORMAL HIGH (ref 11.5–15.5)

## 2011-06-30 LAB — COMPREHENSIVE METABOLIC PANEL
Albumin: 3.2 g/dL — ABNORMAL LOW (ref 3.5–5.2)
BUN: 5 mg/dL — ABNORMAL LOW (ref 6–23)
Calcium: 9 mg/dL (ref 8.4–10.5)
Creatinine, Ser: 0.78 mg/dL (ref 0.50–1.10)
GFR calc Af Amer: >90 mL/min (ref >90)
Glucose, Bld: 128 mg/dL — ABNORMAL HIGH (ref 70–99)
Total Protein: 8.1 g/dL (ref 6.0–8.3)

## 2011-06-30 LAB — PROTIME-INR
INR: 1.01 (ref 0.00–1.49)
Prothrombin Time: 13.5 seconds (ref 11.6–15.2)

## 2011-06-30 SURGERY — ERCP, WITH INTERVENTION IF INDICATED
Anesthesia: Moderate Sedation

## 2011-06-30 MED ORDER — SODIUM CHLORIDE 0.9 % IV SOLN
INTRAVENOUS | Status: DC
  Administered 2011-07-03: 20 mL/h via INTRAVENOUS

## 2011-06-30 MED ORDER — BUTAMBEN-TETRACAINE-BENZOCAINE 2-2-14 % EX AERO
INHALATION_SPRAY | CUTANEOUS | Status: DC | PRN
  Administered 2011-06-30: 2 via TOPICAL

## 2011-06-30 MED ORDER — SODIUM CHLORIDE 0.9 % IV SOLN: INTRAVENOUS | Status: DC

## 2011-06-30 MED ORDER — SODIUM CHLORIDE 0.9 % IV SOLN
Freq: Once | INTRAVENOUS | Status: AC
  Administered 2011-06-30: 500 mL via INTRAVENOUS

## 2011-06-30 MED ORDER — FENTANYL CITRATE 0.05 MG/ML IJ SOLN
INTRAMUSCULAR | Status: AC
  Filled 2011-06-30: qty 4

## 2011-06-30 MED ORDER — MIDAZOLAM HCL 10 MG/2ML IJ SOLN
INTRAMUSCULAR | Status: DC | PRN
  Administered 2011-06-30: 2 mg via INTRAVENOUS
  Administered 2011-06-30 (×2): 1 mg via INTRAVENOUS
  Administered 2011-06-30: 2 mg via INTRAVENOUS
  Administered 2011-06-30 (×2): 1 mg via INTRAVENOUS
  Administered 2011-06-30 (×3): 2 mg via INTRAVENOUS

## 2011-06-30 MED ORDER — GLUCAGON HCL (RDNA) 1 MG IJ SOLR
INTRAMUSCULAR | Status: AC
  Filled 2011-06-30: qty 2

## 2011-06-30 MED ORDER — FENTANYL CITRATE 0.05 MG/ML IJ SOLN
INTRAMUSCULAR | Status: DC | PRN
  Administered 2011-06-30 (×6): 25 ug via INTRAVENOUS

## 2011-06-30 MED ORDER — DIPHENHYDRAMINE HCL 50 MG/ML IJ SOLN
INTRAMUSCULAR | Status: AC
  Filled 2011-06-30: qty 1

## 2011-06-30 MED ORDER — SODIUM CHLORIDE 0.9 % IV SOLN
1.5000 g | Freq: Once | INTRAVENOUS | Status: DC
  Filled 2011-06-30: qty 1.5

## 2011-06-30 MED ORDER — GLUCAGON HCL (RDNA) 1 MG IJ SOLR
INTRAMUSCULAR | Status: DC | PRN
  Administered 2011-06-30 (×2): .5 mg via INTRAVENOUS

## 2011-06-30 MED ORDER — MIDAZOLAM HCL 10 MG/2ML IJ SOLN
INTRAMUSCULAR | Status: AC
  Filled 2011-06-30: qty 4

## 2011-06-30 NOTE — Progress Notes (Signed)
GASTROENTEROLOGY PROGRESS NOTE  Problem:   Choledocholithiasis, s/p lap chole yest.  Subjective: Feels better than yest.   Abd still somewhat sore post-op  Objective: Afebr.  Abd soft, not overty tender.  NAD lying in bed. WBC up 21.4 LFT's up c. 300    INR nl  Assessment: Choledocholithiasis w/out evid of cholangitis  Plan: Proceed w/ ERCP as planned this afternoon.  Pt has no further questions.  Florencia Reasons, M.D. 06/30/2011 10:36 AM

## 2011-06-30 NOTE — Progress Notes (Signed)
1 Day Post-Op  Subjective: Pt ok. Sore but better overall. No N/V, belching some.  Appreciate GI eval  Objective: Vital signs in last 24 hours: Temp:  [97.7 F (36.5 C)-98.6 F (37 C)] 98.3 F (36.8 C) (12/18 0600) Pulse Rate:  [67-89] 89  (12/18 0600) Resp:  [14-26] 20  (12/18 0600) BP: (127-179)/(70-104) 127/76 mmHg (12/18 0600) SpO2:  [87 %-100 %] 95 % (12/18 0911) Last BM Date: 06/26/11  Intake/Output this shift:    Physical Exam: BP 127/76  Pulse 89  Temp(Src) 98.3 F (36.8 C) (Oral)  Resp 20  Ht 5\' 2"  (1.575 m)  Wt 250 lb (113.399 kg)  BMI 45.73 kg/m2  SpO2 95% Lungs: CTA without w/r/r Heart: Regular Abdomen: soft, ND, appropriately tender   Incisions all c/d/i without erythema or hematoma. Ext: No edema or tenderness   Labs: CBC  Basename 06/30/11 0640 06/28/11 0500  WBC 21.5* 15.4*  HGB 8.7* 9.0*  HCT 31.2* 30.6*  PLT 579* 515*   BMET  Basename 06/30/11 0640 06/28/11 0500  NA 139 137  K 4.0 3.5  CL 102 102  CO2 27 22  GLUCOSE 128* 134*  BUN 5* 5*  CREATININE 0.78 0.74  CALCIUM 9.0 9.6   LFT  Basename 06/30/11 0640  PROT 8.1  ALBUMIN 3.2*  AST 345*  ALT 310*  ALKPHOS 150*  BILITOT 1.4*  BILIDIR --  IBILI --  LIPASE --   PT/INR  Basename 06/30/11 0640  LABPROT 13.5  INR 1.01   ABG No results found for this basename: PHART:2,PCO2:2,PO2:2,HCO3:2 in the last 72 hours  Studies/Results: Dg Cholangiogram Operative  06/29/2011  *RADIOLOGY REPORT*  Clinical Data:   Cholelithiasis  INTRAOPERATIVE CHOLANGIOGRAM  Technique:  Cholangiographic images from the C-arm fluoroscopic device were submitted for interpretation post-operatively.  Please see the procedural report for the amount of contrast and the fluoroscopy time utilized.  Comparison:  None  Findings: There is a persistent filling defect in the distal common bile duct near the ampulla.  Minimal contrast passes beyond this into the duodenum.  The intrahepatic biliary tree is  incompletely visualized, appearing decompressed centrally.  IMPRESSION  1.  Retained distal common bile duct stone, partially obstructive.  Original Report Authenticated By: Osa Craver, M.D.    Assessment: Principal Problem:  *Cholecystitis   Procedure(s): LAPAROSCOPIC CHOLECYSTECTOMY WITH INTRAOPERATIVE CHOLANGIOGRAM  Plan: For ERCP today for retained CBD stone. Post op WBC elevation, recheck labs in am.  LOS: 4 days    Marianna Fuss 06/30/2011

## 2011-06-30 NOTE — Progress Notes (Signed)
Postop addendum unfortunately unsuccessful ERCP but no PD injections or wires advancement and elected to hold off on needle-knife sphincterotomy Plan: Observe for delayed complications follow symptoms and labs and consider repeat ERCP as an outpatient after the holidays or when necessary sibgns of stone complications. Dr.buccini to evaluate in the a.m. but will allow clear liquids for now

## 2011-06-30 NOTE — Progress Notes (Signed)
[name]  JYN:[id number]  DOB: Oct 25, 1975  DOA: 06/26/2011  Subjective: Just came back from ERCP, complains of abdominal pain.  Objective: Weight change:   Intake/Output Summary (Last 24 hours) at 06/30/11 1703 Last data filed at 06/30/11 0700  Gross per 24 hour  Intake    450 ml  Output      0 ml  Net    450 ml   Blood pressure 186/110, pulse 89, temperature 97.8 F (36.6 C), temperature source Oral, resp. rate 18, height 5\' 2"  (1.575 m), weight 113.399 kg (250 lb), last menstrual period 06/19/2011, SpO2 99.00%.  Physical Exam: General: Alert and awake, oriented x3, not in any acute distress. HEENT: anicteric sclera, pupils reactive to light and accommodation, EOMI CVS: S1-S2 clear, no murmur rubs or gallops Chest: Clear to auscultation bilaterally  Abdomen: soft right upper quadrant tenderness, nondistended, normal bowel sounds, no organomegaly Extremities: no cyanosis, clubbing or edema noted bilaterally Neuro: Cranial nerves II-XII intact, no focal neurological deficits  Lab Results: Basic Metabolic Panel:  Lab 06/30/11 8657 06/28/11 0500  NA 139 137  K 4.0 3.5  CL 102 102  CO2 27 22  GLUCOSE 128* 134*  BUN 5* 5*  CREATININE 0.78 0.74  CALCIUM 9.0 9.6  MG -- --  PHOS -- --   Liver Function Tests:  Lab 06/30/11 0640 06/28/11 0500  AST 345* 30  ALT 310* 34  ALKPHOS 150* 81  BILITOT 1.4* 0.3  PROT 8.1 8.0  ALBUMIN 3.2* 3.3*    Lab 06/27/11 0630 06/26/11 0455  LIPASE 20 33  AMYLASE -- --   CBC:  Lab 06/30/11 0640 06/28/11 0500  WBC 21.5* 15.4*  NEUTROABS -- 13.7*  HGB 8.7* 9.0*  HCT 31.2* 30.6*  MCV 60.2* 59.1*  PLT 579* 515*    Studies/Results: Dg Chest 2 View  06/26/2011  *RADIOLOGY REPORT*  Clinical Data: Dizziness and weakness.  Upper respiratory infection.  CHEST - 2 VIEW  Comparison:  05/26/2011.  Findings: Trachea is midline.  Heart size normal.  Mild interstitial prominence appears unchanged from 05/26/2011.  No focal airspace  consolidation or pleural fluid.  IMPRESSION: Mild interstitial prominence appears unchanged 05/26/2011 and is therefore difficult to exclude a viral process.  Original Report Authenticated By: Reyes Ivan, M.D.   US Abdomen Complete  06/26/2011  *RADIOLOGY REPORT*  Clinical Data:  35 year old with pain.  COMPLETE ABDOMINAL ULTRASOUND  Comparison:  Abdominal CT 06/26/2011  Findings:  Gallbladder:  There are multiple echogenic stones in the gallbladder that demonstrate posterior acoustic shadowing.  The gallbladder is not significantly distended but there is mild wall thickening measuring up to 4 mm.  Largest stones measure 5 mm. There is not a sonographic Murphy's sign.  Common bile duct:  Measures 4 mm in diameter.  Liver:  No focal lesion identified.  Within normal limits in parenchymal echogenicity.  IVC:  Appears normal.  Pancreas:  No focal abnormality seen.  Spleen:  Measures 6.8 cm in length.  Right Kidney:  Measures 12.2 cm in length with normal echotexture. Negative for hydronephrosis.  Left Kidney:  Measures 11.6 cm in length without hydronephrosis.  Abdominal aorta:  No aneurysm identified.  Other findings:  There is a soft tissue structure in the lower abdomen and this is compatible with a uterine fibroid based on the recent CT findings.  IMPRESSION: Cholelithiasis.  There is mild gallbladder wall thickening but the gallbladder is likely contracted.  No evidence for a sonographic Murphy's sign.  Soft tissue structure  in the lower abdomen is compatible with an enlarged uterus and fibroid.  Original Report Authenticated By: Richarda Overlie, M.D.   Ct Abdomen Pelvis W Contrast  06/26/2011  *RADIOLOGY REPORT*  Clinical Data: Abdominal pain, nausea, vomiting  CT ABDOMEN AND PELVIS WITH CONTRAST  Technique:  Multidetector CT imaging of the abdomen and pelvis was performed following the standard protocol during bolus administration of intravenous contrast.  Contrast:  100 ml Omnipaque-300  Comparison:  Chest x-ray of 05/26/2011  Findings: The lung bases are clear.  There is some thickening of the mucosa of the distal esophagus which may be due to esophagitis or possibly a small hiatal hernia.  The liver enhances with no focal abnormality and no ductal dilatation is seen.  Small gallstones layer within the gallbladder.  The gallbladder does not appear to be distended.  However, there do appear to be common bile duct calculi within the distal common bile duct without definite obstruction currently.  These may be intermittently obstructing calculi.  The pancreas is normal in size and the pancreatic duct is not dilated.  The adrenal glands and spleen are unremarkable.  The kidneys enhance with no calculus or mass and no hydronephrosis is seen.  The abdominal aorta is normal in caliber.  There is enlarged lobular uterus present consistent with multiple uterine fibroids.  The largest fibroid is within the uterine fundus measuring 8.7 x 12.9 x 9.0 cm.  The urinary bladder is unremarkable.  No free fluid is seen within the pelvis.  Probable small right ovarian cyst is noted.  No abnormality of the colon is seen.  No bony abnormality is seen.  IMPRESSION:  1.  Gallstones and apparent nonobstructing distal common bile duct calculi which may be intermittently obstructing. 2.  Enlarged multi fibroid uterus.  I discussed the findings of this study with Dr. Linwood Dibbles at 9:02 a.m. on 06/26/2011.  Original Report Authenticated By: Juline Patch, M.D.    Medications: Scheduled Meds:    . sodium chloride   Intravenous Once  . sodium chloride   Intravenous Once  . albuterol  2.5 mg Nebulization BID  . ampicillin-sulbactam (UNASYN) IV  1.5 g Intravenous Once  . benzonatate  100 mg Oral TID  . budesonide-formoterol  2 puff Inhalation BID  . guaiFENesin  600 mg Oral BID  . HYDROcodone-homatropine  5 mL Oral Q12H  . ipratropium  0.5 mg Nebulization BID  . methylPREDNISolone (SOLU-MEDROL) injection  40 mg Intravenous Q8H    . montelukast  10 mg Oral QHS  . pantoprazole  40 mg Oral QHS  . piperacillin-tazobactam (ZOSYN)  IV  3.375 g Intravenous Q8H  . DISCONTD: albuterol  2.5 mg Nebulization Q6H  . DISCONTD: ipratropium  0.5 mg Nebulization Q6H   Continuous Infusions:    . sodium chloride    . dextrose 5 % and 0.45 % NaCl with KCl 20 mEq/L 20 mL (06/28/11 1318)  . lactated ringers 20 mL (06/30/11 0700)     Assessment/Plan:  Principal problem:  1. abdominal pain with acute cholecystitis/acute choledocholithiasis:  - Management per primary service ([facility]),  Patient had Lap Chole today and unsuccessful ERCP.  Plan for OP ERCP.   Active Problems:  2. acute on chronic asthma exacerbation: improving - Continue current management with scheduled albuterol and Atrovent nebulizer breathing treatments, and IV Solu-Medrol. Patient is already on Unasyn, and at the time of discharge, can DC on Augmentin or Avelox for a week.  - Continue Symbicort, Singulair, Tessalon Perles and Hycodan  cough syrup.  3. Leukocytosis:  This could be secondary to the Solumedrol.  Agree with following the trend and check the differential in AM.    FOR DISCHARGE:  Per Dr Isidoro Donning 1. I have placed home health order for nebulizer machine. Please give prescription for albuterol nebulizer solution, 2.5 mg 3 times a day prn (i instructed her to continue TID for next few days) 2. Continue Symbicort 1 puff BID, singulair (HOME meds) 3. Give prescription for Medrol Pack, dose 1, avelox 400mg  daily for 5 days   4. Give prescription for Iron 325mg  daily 5. I made her appt with [facility] pulmonology, Dr. Sherene Sires on December 24, Monday at 11 AM. Patient will have to call the office to reschedule if she cannot make it.  3. anemia with a baseline hemoglobin around 8 - S/P one unit of packed RBCs, hemoglobin stable. Counseled patient to followup with her OB-GYN after discharge.      LOS: 4 days   Earlene Plater MD, Ladell Pier 06/30/2011, 5:03 PM

## 2011-06-30 NOTE — Op Note (Signed)
[facility] [address] [place], [place]  [address]  ERCP PROCEDURE REPORT  PATIENT:  [name], [name]  MR#:  [id number] BIRTHDATE:  08-Jan-1976  GENDER:  female  ENDOSCOPIST:  Vida Rigger, MD ASSISTANT:  Dwain Sarna, RN CGRN  PROCEDURE DATE:  06/30/2011 PROCEDURE:  ERCP ASA CLASS:  Class II  INDICATIONS:  suspected stone positive IOC  MEDICATIONS:  150 mcg of fentanyl 14 mg of Versed and 1 mg of glucogon TOPICAL ANESTHETIC:  Used  DESCRIPTION OF PROCEDURE:   After the risks benefits and alternatives of the procedure were thoroughly explained, informed consent was obtained.  The PENTAX THERAPEUTIC SIDE VIEWING DUODENAL SCOPE  was introduced through the mouth and advanced to the duodenum. A bulbous movable ampulla was brought into view unfortunately despite a prolonged effort using 2 different sphincterotomes and 2 different positions we were unsuccessful at cannulation. Bile was seen to drain periodically but no pancreatic injections or wires advancement were done and unfortunately no CBD cannulation as well. After a prolonged effort we elected to withdraw.  The examination had no additional endoscopic findings.  The scope was then completely withdrawn from the patient and the procedure terminated.  COMPLICATIONS:  None ENDOSCOPIC IMPRESSION: Bulbous movable ampulla with failed cannulation  RECOMMENDATIONS: Observe for delayed complications followup labs and if none have partner retry either as inpatient or outpatient over the next week or 2  ______________________________ Vida Rigger, MD  CC:  Chevis Pretty, MD  n. Rosalie DoctorVida Rigger at 06/30/2011 04:28 PM  Johney Frame, [phone]

## 2011-07-01 LAB — COMPREHENSIVE METABOLIC PANEL
ALT: 279 U/L — ABNORMAL HIGH (ref 0–35)
AST: 253 U/L — ABNORMAL HIGH (ref 0–37)
Albumin: 2.9 g/dL — ABNORMAL LOW (ref 3.5–5.2)
Albumin: 3.3 g/dL — ABNORMAL LOW (ref 3.5–5.2)
Alkaline Phosphatase: 170 U/L — ABNORMAL HIGH (ref 39–117)
BUN: 9 mg/dL (ref 6–23)
CO2: 28 mEq/L (ref 19–32)
Chloride: 104 mEq/L (ref 96–112)
Chloride: 100 mEq/L (ref 96–112)
Creatinine, Ser: 0.85 mg/dL (ref 0.50–1.10)
GFR calc Af Amer: >90 mL/min (ref >90)
GFR calc non Af Amer: >90 mL/min (ref >90)
Glucose, Bld: 136 mg/dL — ABNORMAL HIGH (ref 70–99)
Potassium: 3.9 mEq/L (ref 3.5–5.1)
Sodium: 138 mEq/L (ref 135–145)
Total Bilirubin: 0.9 mg/dL (ref 0.3–1.2)
Total Bilirubin: 1.5 mg/dL — ABNORMAL HIGH (ref 0.3–1.2)
Total Protein: 8 g/dL (ref 6.0–8.3)

## 2011-07-01 LAB — URINE MICROSCOPIC-ADD ON

## 2011-07-01 LAB — URINALYSIS, ROUTINE W REFLEX MICROSCOPIC
Leukocytes, UA: NEGATIVE
Nitrite: NEGATIVE
Protein, ur: NEGATIVE mg/dL
Specific Gravity, Urine: 1.016 (ref 1.005–1.030)
Urobilinogen, UA: 0.2 mg/dL (ref 0.0–1.0)

## 2011-07-01 LAB — IRON AND TIBC
Saturation Ratios: 5 % — ABNORMAL LOW (ref 20–55)
UIBC: 360 ug/dL (ref 125–400)

## 2011-07-01 LAB — RETICULOCYTES
RBC.: 4.91 MIL/uL (ref 3.87–5.11)
Retic Count, Absolute: 68.7 10*3/uL (ref 19.0–186.0)
Retic Ct Pct: 1.4 % (ref 0.4–3.1)

## 2011-07-01 LAB — CBC
Platelets: 553 10*3/uL — ABNORMAL HIGH (ref 150–400)
RBC: 4.68 MIL/uL (ref 3.87–5.11)
RDW: 22.5 % — ABNORMAL HIGH (ref 11.5–15.5)
WBC: 16 10*3/uL — ABNORMAL HIGH (ref 4.0–10.5)

## 2011-07-01 LAB — FERRITIN: Ferritin: 39 ng/mL (ref 10–291)

## 2011-07-01 LAB — FOLATE: Folate: 8 ng/mL

## 2011-07-01 MED ORDER — POLYETHYLENE GLYCOL 3350 17 G PO PACK
17.0000 g | PACK | Freq: Every day | ORAL | Status: DC
  Administered 2011-07-01 – 2011-07-02 (×2): 17 g via ORAL
  Filled 2011-07-01 (×4): qty 1

## 2011-07-01 MED ORDER — PANTOPRAZOLE SODIUM 40 MG PO TBEC
40.0000 mg | DELAYED_RELEASE_TABLET | Freq: Two times a day (BID) | ORAL | Status: DC
  Administered 2011-07-01 – 2011-07-03 (×4): 40 mg via ORAL
  Filled 2011-07-01 (×4): qty 1

## 2011-07-01 MED ORDER — PREDNISONE 20 MG PO TABS
40.0000 mg | ORAL_TABLET | Freq: Two times a day (BID) | ORAL | Status: DC
  Filled 2011-07-01 (×8): qty 2

## 2011-07-01 NOTE — Progress Notes (Signed)
1 Day Post-Op  Subjective: Pt ok. No N/V, tolerating clears ok. Mild soreness, pain control good. GI ERCP noted, unsuccessful.    Objective: Vital signs in last 24 hours: Temp:  [97.8 F (36.6 C)-97.9 F (36.6 C)] 97.9 F (36.6 C) (12/19 0539) Pulse Rate:  [61-75] 61  (12/19 0539) Resp:  [10-25] 20  (12/19 0539) BP: (133-249)/(49-161) 182/79 mmHg (12/19 0539) SpO2:  [92 %-100 %] 92 % (12/19 0750) Last BM Date: 06/30/11  Intake/Output this shift:    Physical Exam: BP 182/79  Pulse 61  Temp(Src) 97.9 F (36.6 C) (Oral)  Resp 20  Ht 5\' 2"  (1.575 m)  Wt 113.399 kg (250 lb)  BMI 45.73 kg/m2  SpO2 92%  LMP 06/19/2011 Lungs: CTA without w/r/r Heart: Regular Abdomen: soft, ND, appropriately tender   Incisions all c/d/i without erythema or hematoma. Ext: No edema or tenderness   Labs: CBC  Basename 06/30/11 2344 06/30/11 0640  WBC 16.0* 21.5*  HGB 7.9* 8.7*  HCT 28.1* 31.2*  PLT 553* 579*   BMET  Basename 06/30/11 2344 06/30/11 0640  NA 138 139  K 3.8 4.0  CL 104 102  CO2 28 27  GLUCOSE 103* 128*  BUN 7 5*  CREATININE 0.74 0.78  CALCIUM 8.6 9.0   LFT  Basename 06/30/11 2344  PROT 6.7  ALBUMIN 2.9*  AST 253*  ALT 279*  ALKPHOS 133*  BILITOT 0.9  BILIDIR --  IBILI --  LIPASE --   PT/INR  Basename 06/30/11 0640  LABPROT 13.5  INR 1.01   ABG No results found for this basename: PHART:2,PCO2:2,PO2:2,HCO3:2 in the last 72 hours  Studies/Results: Dg Cholangiogram Operative  06/29/2011  *RADIOLOGY REPORT*  Clinical Data:   Cholelithiasis  INTRAOPERATIVE CHOLANGIOGRAM  Technique:  Cholangiographic images from the C-arm fluoroscopic device were submitted for interpretation post-operatively.  Please see the procedural report for the amount of contrast and the fluoroscopy time utilized.  Comparison:  None  Findings: There is a persistent filling defect in the distal common bile duct near the ampulla.  Minimal contrast passes beyond this into the  duodenum.  The intrahepatic biliary tree is incompletely visualized, appearing decompressed centrally.  IMPRESSION  1.  Retained distal common bile duct stone, partially obstructive.  Original Report Authenticated By: Osa Craver, M.D.    Assessment: Principal Problem:  *Cholecystitis   Procedure(s): ENDOSCOPIC RETROGRADE CHOLANGIOPANCREATOGRAPHY (ERCP)  Plan: Await repeat LFTs and input from GI If enzymes ok, advance diet?  LOS: 5 days    [name] 07/01/2011

## 2011-07-01 NOTE — Progress Notes (Signed)
Subjective: Complaining of abdominal pain, denies any wheezing  Objective: Vital signs in last 24 hours: Filed Vitals:   07/01/11 0539 07/01/11 0750 07/01/11 1255 07/01/11 1256  BP: 182/79  131/80 159/88  Pulse: 61     Temp: 97.9 F (36.6 C)     TempSrc: Oral     Resp: 20     Height:      Weight:      SpO2: 94% 92%      Intake/Output Summary (Last 24 hours) at 07/01/11 1402 Last data filed at 06/30/11 1800  Gross per 24 hour  Intake    240 ml  Output      0 ml  Net    240 ml    Weight change:   General: Alert, awake, oriented x3, in no acute distress. HEENT: No bruits, no goiter. Heart: Regular rate and rhythm, without murmurs, rubs, gallops. Lungs: Clear to auscultation bilaterally. Abdomen: Soft, nontender, nondistended, positive bowel sounds. Extremities: No clubbing cyanosis or edema with positive pedal pulses. Neuro: Grossly intact, nonfocal.    Lab Results: Results for orders placed during the hospital encounter of 06/26/11 (from the past 24 hour(s))  CBC     Status: Abnormal   Collection Time   06/30/11 11:44 PM      Component Value Range   WBC 16.0 (*) 4.0 - 10.5 (K/uL)   RBC 4.68  3.87 - 5.11 (MIL/uL)   Hemoglobin 7.9 (*) 12.0 - 15.0 (g/dL)   HCT 16.1 (*) 09.6 - 46.0 (%)   MCV 60.0 (*) 78.0 - 100.0 (fL)   MCH 16.9 (*) 26.0 - 34.0 (pg)   MCHC 28.1 (*) 30.0 - 36.0 (g/dL)   RDW 04.5 (*) 40.9 - 15.5 (%)   Platelets 553 (*) 150 - 400 (K/uL)  COMPREHENSIVE METABOLIC PANEL     Status: Abnormal   Collection Time   06/30/11 11:44 PM      Component Value Range   Sodium 138  135 - 145 (mEq/L)   Potassium 3.8  3.5 - 5.1 (mEq/L)   Chloride 104  96 - 112 (mEq/L)   CO2 28  19 - 32 (mEq/L)   Glucose, Bld 103 (*) 70 - 99 (mg/dL)   BUN 7  6 - 23 (mg/dL)   Creatinine, Ser 8.11  0.50 - 1.10 (mg/dL)   Calcium 8.6  8.4 - 91.4 (mg/dL)   Total Protein 6.7  6.0 - 8.3 (g/dL)   Albumin 2.9 (*) 3.5 - 5.2 (g/dL)   AST 782 (*) 0 - 37 (U/L)   ALT 279 (*) 0 - 35 (U/L)   Alkaline Phosphatase 133 (*) 39 - 117 (U/L)   Total Bilirubin 0.9  0.3 - 1.2 (mg/dL)   GFR calc non Af Amer >90  >90 (mL/min)   GFR calc Af Amer >90  >90 (mL/min)  URINALYSIS, ROUTINE W REFLEX MICROSCOPIC     Status: Abnormal   Collection Time   07/01/11  9:12 AM      Component Value Range   Color, Urine YELLOW  YELLOW    APPearance CLOUDY (*) CLEAR    Specific Gravity, Urine 1.016  1.005 - 1.030    pH 7.0  5.0 - 8.0    Glucose, UA NEGATIVE  NEGATIVE (mg/dL)   Hgb urine dipstick SMALL (*) NEGATIVE    Bilirubin Urine NEGATIVE  NEGATIVE    Ketones, ur NEGATIVE  NEGATIVE (mg/dL)   Protein, ur NEGATIVE  NEGATIVE (mg/dL)   Urobilinogen, UA 0.2  0.0 - 1.0 (  mg/dL)   Nitrite NEGATIVE  NEGATIVE    Leukocytes, UA NEGATIVE  NEGATIVE   URINE MICROSCOPIC-ADD ON     Status: Abnormal   Collection Time   07/01/11  9:12 AM      Component Value Range   Squamous Epithelial / LPF FEW (*) RARE    WBC, UA 0-2  <3 (WBC/hpf)   RBC / HPF 0-2  <3 (RBC/hpf)  COMPREHENSIVE METABOLIC PANEL     Status: Abnormal   Collection Time   07/01/11  9:15 AM      Component Value Range   Sodium 137  135 - 145 (mEq/L)   Potassium 3.9  3.5 - 5.1 (mEq/L)   Chloride 100  96 - 112 (mEq/L)   CO2 28  19 - 32 (mEq/L)   Glucose, Bld 136 (*) 70 - 99 (mg/dL)   BUN 9  6 - 23 (mg/dL)   Creatinine, Ser 1.61  0.50 - 1.10 (mg/dL)   Calcium 9.5  8.4 - 09.6 (mg/dL)   Total Protein 8.0  6.0 - 8.3 (g/dL)   Albumin 3.3 (*) 3.5 - 5.2 (g/dL)   AST 045 (*) 0 - 37 (U/L)   ALT 375 (*) 0 - 35 (U/L)   Alkaline Phosphatase 170 (*) 39 - 117 (U/L)   Total Bilirubin 1.5 (*) 0.3 - 1.2 (mg/dL)   GFR calc non Af Amer 87 (*) >90 (mL/min)   GFR calc Af Amer >90  >90 (mL/min)     Micro: No results found for this or any previous visit (from the past 240 hour(s)).  Studies/Results: Dg Cholangiogram Operative  06/29/2011   IMPRESSION  1.  Retained distal common bile duct stone, partially obstructive.  Original Report Authenticated By: Osa Craver, M.D.    Medications:    . sodium chloride   Intravenous Once  . albuterol  2.5 mg Nebulization BID  . benzonatate  100 mg Oral TID  . budesonide-formoterol  2 puff Inhalation BID  . guaiFENesin  600 mg Oral BID  . HYDROcodone-homatropine  5 mL Oral Q12H  . ipratropium  0.5 mg Nebulization BID  . methylPREDNISolone (SOLU-MEDROL) injection  40 mg Intravenous Q8H  . montelukast  10 mg Oral QHS  . pantoprazole  40 mg Oral QHS  . piperacillin-tazobactam (ZOSYN)  IV  3.375 g Intravenous Q8H  . polyethylene glycol  17 g Oral Daily  . DISCONTD: ampicillin-sulbactam (UNASYN) IV  1.5 g Intravenous Once     Assessment:  Principal problem:  1. abdominal pain with acute cholecystitis/acute choledocholithiasis: This is being managed per CC S. and gastroenterology   2. acute on chronic asthma exacerbation: improving  - Continue current management with scheduled albuterol and Atrovent nebulizer breathing treatments, and IV Solu-Medrol. Patient is already on Unasyn, and at the time of discharge, can DC on Augmentin or Avelox for a week. Will switch to by mouth prednisone today. - Continue Symbicort, Singulair, Tessalon Perles and Hycodan cough syrup.  3. Leukocytosis: This could be secondary to the Solumedrol. Agree with following the trend and check the differential in AM.     FOR DISCHARGE: Per Dr Isidoro Donning  1. I have placed home health order for nebulizer machine. Please give prescription for albuterol nebulizer solution, 2.5 mg 3 times a day prn (i instructed her to continue TID for next few days)  2. Continue Symbicort 1 puff BID, singulair (HOME meds)  3. Give prescription for Medrol Pack, dose 1, the time of discharge, avelox 400mg  daily for  5 days  4. Give prescription for Iron 325mg  daily  5. I made her appt with [facility] pul[facility]ology, Dr. Sherene Sires on December 24, Monday at 11 AM. Patient will have to call the office to reschedule if she cannot make it.  3. anemia with a  baseline hemoglobin around 8  - S/P one unit of packed RBCs, hemoglobin stable. Counseled patient to followup with her OB-GYN after discharge.  We'll sign off at this time. Please contact us, and any other acute medical issues arise.  LOS: 5 days   [facility] 07/01/2011, 2:02 PM

## 2011-07-01 NOTE — Progress Notes (Signed)
GASTROENTEROLOGY PROGRESS NOTE  Problem:   Common bile duct stone.  Subjective: Moderate abdominal pain today.  Objective: Afebrile. Lying in bed appearing slightly uncomfortable but not toxic or in acute distress. Anicteric. Abdomen without overt tenderness, allowing for postoperative state.  White count not checked today, but liver chemistries show moderate increase.  Assessment: Symptomatic choledocholithiasis, without evidence of cholangitis, status post unsuccessful attempt at ERCP yesterday  Plan: A repeat attempt at ERCP has been scheduled for Friday morning. If that is unsuccessful, the next step would probably be sending the patient to a Medical Center for a further attempt, perhaps with a needle-knife precut sphincterotomy. The patient is agreeable to this plan of action, recognizing that there is no guarantee a successful with a repeat attempt here, but we feel it is worth a try.  In the meantime, we will monitor the patient for complications such as cholangitis or pancreatitis by means of appropriate blood work. We will also allow her a small amount of a low fat diet, as tolerated.   [name], M.D. 07/01/2011 3:54 PM

## 2011-07-01 NOTE — Progress Notes (Signed)
Seen and agree LFT's rising

## 2011-07-02 LAB — COMPREHENSIVE METABOLIC PANEL
Alkaline Phosphatase: 144 U/L — ABNORMAL HIGH (ref 39–117)
BUN: 10 mg/dL (ref 6–23)
Chloride: 101 mEq/L (ref 96–112)
Creatinine, Ser: 0.9 mg/dL (ref 0.50–1.10)
GFR calc Af Amer: >90 mL/min (ref >90)
Glucose, Bld: 100 mg/dL — ABNORMAL HIGH (ref 70–99)
Potassium: 3.5 mEq/L (ref 3.5–5.1)
Total Bilirubin: 1.7 mg/dL — ABNORMAL HIGH (ref 0.3–1.2)

## 2011-07-02 LAB — CBC
HCT: 28.8 % — ABNORMAL LOW (ref 36.0–46.0)
Hemoglobin: 8.1 g/dL — ABNORMAL LOW (ref 12.0–15.0)
MCHC: 28.1 g/dL — ABNORMAL LOW (ref 30.0–36.0)
MCV: 59.3 fL — ABNORMAL LOW (ref 78.0–100.0)
Platelets: 595 10*3/uL — ABNORMAL HIGH (ref 150–400)
RBC: 5.37 MIL/uL — ABNORMAL HIGH (ref 3.87–5.11)
RDW: 22.4 % — ABNORMAL HIGH (ref 11.5–15.5)
RDW: 22.7 % — ABNORMAL HIGH (ref 11.5–15.5)
WBC: 14.1 10*3/uL — ABNORMAL HIGH (ref 4.0–10.5)
WBC: 13.9 10*3/uL — ABNORMAL HIGH (ref 4.0–10.5)

## 2011-07-02 LAB — LIPASE, BLOOD: Lipase: 21 U/L (ref 11–59)

## 2011-07-02 LAB — BASIC METABOLIC PANEL
Calcium: 8.9 mg/dL (ref 8.4–10.5)
Creatinine, Ser: 0.85 mg/dL (ref 0.50–1.10)
GFR calc Af Amer: >90 mL/min (ref >90)
GFR calc non Af Amer: 87 mL/min — ABNORMAL LOW (ref >90)
Sodium: 137 mEq/L (ref 135–145)

## 2011-07-02 NOTE — Progress Notes (Signed)
GASTROENTEROLOGY PROGRESS NOTE  Problem:   Common duct stone  Subjective: Feels better than yesterday. Tolerating a low-fat diet.  Objective: Sitting up on the side of the bed, eating lunch, no acute distress.  Afebrile. Abdomen soft, and less tender than in previous days.  Liver chemistries elevated but stable, minimal increase in bilirubin. WBC improved at 16,000  Assessment: Choledocholithiasis with persistent elevation of liver chemistries  Plan: ERCP under MAC or general anesthesia by Dr. Willis Modena tomorrow, in the OR. This plan was discussed with the patient and she is agreeable.  [name], M.D. 07/02/2011 3:47 PM

## 2011-07-02 NOTE — Progress Notes (Signed)
2 Days Post-Op  Subjective: Pt ok. Not much pain, going to try and eat some reg diet. GI plans noted.  Objective: Vital signs in last 24 hours: Temp:  [98 F (36.7 C)-98.9 F (37.2 C)] 98.1 F (36.7 C) (12/20 0600) Pulse Rate:  [58-78] 58  (12/20 0600) Resp:  [16-19] 18  (12/20 0600) BP: (131-178)/(80-123) 170/100 mmHg (12/20 0600) SpO2:  [94 %-97 %] 97 % (12/20 0600) Last BM Date: 07/01/11  Intake/Output this shift:    Physical Exam: BP 170/100  Pulse 58  Temp(Src) 98.1 F (36.7 C) (Oral)  Resp 18  Ht 5\' 2"  (1.575 m)  Wt 113.399 kg (250 lb)  BMI 45.73 kg/m2  SpO2 97%  LMP 06/19/2011 Lungs: CTA without w/r/r Heart: Regular Abdomen: soft, ND, appropriately tender   Incisions all c/d/i without erythema or hematoma. Ext: No edema or tenderness   Labs: CBC  Basename 07/02/11 0625 06/30/11 2344  WBC 14.1* 16.0*  HGB 8.1* 7.9*  HCT 28.8* 28.1*  PLT 529* 553*   BMET  Basename 07/01/11 0915 06/30/11 2344  NA 137 138  K 3.9 3.8  CL 100 104  CO2 28 28  GLUCOSE 136* 103*  BUN 9 7  CREATININE 0.85 0.74  CALCIUM 9.5 8.6   LFT  Basename 07/01/11 0915  PROT 8.0  ALBUMIN 3.3*  AST 329*  ALT 375*  ALKPHOS 170*  BILITOT 1.5*  BILIDIR --  IBILI --  LIPASE --   PT/INR  Basename 06/30/11 0640  LABPROT 13.5  INR 1.01   ABG No results found for this basename: PHART:2,PCO2:2,PO2:2,HCO3:2 in the last 72 hours  Studies/Results: Dg C-arm Gt 120 Min-no Report  07/01/2011  CLINICAL DATA: cbd stones   C-ARM GT 120 MINUTE  Fluoroscopy was utilized by the requesting physician.  No radiographic  interpretation.      Assessment: Principal Problem:  *Cholecystitis   Procedure(s): ENDOSCOPIC RETROGRADE CHOLANGIOPANCREATOGRAPHY (ERCP)  Plan: WBC trending down. For repeat ERCP tomorrow.  LOS: 6 days    Marianna Fuss 07/02/2011

## 2011-07-02 NOTE — Progress Notes (Signed)
   CARE MANAGEMENT NOTE 07/02/2011  Patient:  [name], [name]   Account Number:  [id number]  Date Initiated:  07/02/2011  Documentation initiated by:  Carlyle Lipa  Subjective/Objective Assessment:   Abd pain; N/V. Has had lap chole. Planned for ERCP. Resp issues in addition to abd issues.     Action/Plan:   Home with [facility] for nebulizer teaching.   Anticipated DC Date:  07/04/2011   Anticipated DC Plan:  HOME W HOME HEALTH SERVICES      DC Planning Services  CM consult      [facility] Choice  HOME HEALTH  DURABLE MEDICAL EQUIPMENT   Choice offered to / List presented to:  C-1 Patient   DME arranged  NEBULIZER MACHINE  NEBULIZER/MEDS      DME agency  [facility]     [facility] arranged  HH-1 RN      [facility] agency  [facility]   Status of service:  In process, will continue to follow  Per UR Regulation:  Reviewed for med. necessity/level of care/duration of stay  Comments:  07/02/2011 Carlyle Lipa, RN BSN CCM 1515--Pt with [facility] needs for d/c.  Arranged as above. Advised PA that if he writes for the nebulizer meds, [facility] will deliver them to the patient at home.  Pt's address and phone [phone]=cell) are correct in Epic, however, pt will be staying with her mother for a period of time right after discharge. Mother's address is [address] [place], [place] [address].

## 2011-07-03 ENCOUNTER — Encounter (HOSPITAL_COMMUNITY): Admission: EM | Disposition: A | Discharge status: Home / Self Care | Payer: Self-pay | Source: Home / Self Care

## 2011-07-03 ENCOUNTER — Encounter (HOSPITAL_COMMUNITY): Payer: Self-pay | Admitting: Anesthesiology

## 2011-07-03 ENCOUNTER — Inpatient Hospital Stay (HOSPITAL_COMMUNITY): Payer: Medicare Other | Admitting: Anesthesiology

## 2011-07-03 ENCOUNTER — Inpatient Hospital Stay (HOSPITAL_COMMUNITY): Payer: Medicare Other

## 2011-07-03 HISTORY — PX: ERCP: SHX5425

## 2011-07-03 LAB — COMPREHENSIVE METABOLIC PANEL
ALT: 379 U/L — ABNORMAL HIGH (ref 0–35)
Alkaline Phosphatase: 150 U/L — ABNORMAL HIGH (ref 39–117)
CO2: 27 mEq/L (ref 19–32)
Chloride: 98 mEq/L (ref 96–112)
GFR calc Af Amer: >90 mL/min (ref >90)
Glucose, Bld: 79 mg/dL (ref 70–99)
Potassium: 3.4 mEq/L — ABNORMAL LOW (ref 3.5–5.1)
Sodium: 135 mEq/L (ref 135–145)
Total Bilirubin: 0.7 mg/dL (ref 0.3–1.2)
Total Protein: 7.2 g/dL (ref 6.0–8.3)

## 2011-07-03 LAB — TYPE AND SCREEN: ABO/RH(D): O POS

## 2011-07-03 LAB — DIFFERENTIAL
Eosinophils Absolute: 0.2 10*3/uL (ref 0.0–0.7)
Eosinophils Relative: 1 % (ref 0–5)
Lymphs Abs: 4.3 10*3/uL — ABNORMAL HIGH (ref 0.7–4.0)
Monocytes Relative: 6 % (ref 3–12)

## 2011-07-03 LAB — CBC
Hemoglobin: 8.8 g/dL — ABNORMAL LOW (ref 12.0–15.0)
MCHC: 28.8 g/dL — ABNORMAL LOW (ref 30.0–36.0)
RBC: 5.2 MIL/uL — ABNORMAL HIGH (ref 3.87–5.11)
WBC: 16.8 10*3/uL — ABNORMAL HIGH (ref 4.0–10.5)

## 2011-07-03 SURGERY — ERCP, WITH INTERVENTION IF INDICATED
Anesthesia: Monitor Anesthesia Care

## 2011-07-03 SURGERY — ERCP, WITH INTERVENTION IF INDICATED
Anesthesia: Moderate Sedation

## 2011-07-03 MED ORDER — LACTATED RINGERS IV SOLN
INTRAVENOUS | Status: DC
  Administered 2011-07-03: 11:00:00 via INTRAVENOUS

## 2011-07-03 MED ORDER — MIDAZOLAM HCL 5 MG/5ML IJ SOLN
INTRAMUSCULAR | Status: DC | PRN
  Administered 2011-07-03: 2 mg via INTRAVENOUS

## 2011-07-03 MED ORDER — FENTANYL CITRATE 0.05 MG/ML IJ SOLN
INTRAMUSCULAR | Status: DC | PRN
  Administered 2011-07-03: 100 ug via INTRAVENOUS

## 2011-07-03 MED ORDER — PROPOFOL 10 MG/ML IV EMUL
INTRAVENOUS | Status: DC | PRN
  Administered 2011-07-03: 200 mg via INTRAVENOUS

## 2011-07-03 MED ORDER — SUCCINYLCHOLINE CHLORIDE 20 MG/ML IJ SOLN
INTRAMUSCULAR | Status: DC | PRN
  Administered 2011-07-03: 100 mg via INTRAVENOUS

## 2011-07-03 MED ORDER — HYDRALAZINE HCL 20 MG/ML IJ SOLN
10.0000 mg | Freq: Four times a day (QID) | INTRAMUSCULAR | Status: DC | PRN
  Filled 2011-07-03 (×2): qty 0.5

## 2011-07-03 MED ORDER — CIPROFLOXACIN IN D5W 400 MG/200ML IV SOLN
INTRAVENOUS | Status: AC
  Filled 2011-07-03: qty 200

## 2011-07-03 MED ORDER — CIPROFLOXACIN IN D5W 400 MG/200ML IV SOLN
INTRAVENOUS | Status: DC | PRN
  Administered 2011-07-03: 400 mg via INTRAVENOUS

## 2011-07-03 MED ORDER — LACTATED RINGERS IV SOLN
INTRAVENOUS | Status: DC | PRN
  Administered 2011-07-03: 11:00:00 via INTRAVENOUS

## 2011-07-03 MED ORDER — DROPERIDOL 2.5 MG/ML IJ SOLN
0.6250 mg | INTRAMUSCULAR | Status: DC | PRN
  Administered 2011-07-03: 0.625 mg via INTRAVENOUS

## 2011-07-03 MED ORDER — ONDANSETRON HCL 4 MG/2ML IJ SOLN
INTRAMUSCULAR | Status: DC | PRN
  Administered 2011-07-03: 4 mg via INTRAVENOUS

## 2011-07-03 MED ORDER — HYDROMORPHONE HCL PF 1 MG/ML IJ SOLN: 0.2500 mg | INTRAMUSCULAR | Status: DC | PRN

## 2011-07-03 NOTE — Progress Notes (Signed)
The patient is examined approximately 7 hours status post ERCP performed earlier today, which unfortunately was unsuccessful in achieving cannulation of the bile duct. The patient is free of any significant pain and feels ready to eat and in fact, ready to go home.  She is afebrile. Her white count is somewhat improved, and her liver chemistries are also somewhat better compared to yesterday.  Examination: The patient is in no acute distress whatsoever, visiting with friends and family and her 41 month old daughter. The abdomen is nontender.  Impression: Benign post ERCP course  Plan:  1. Okay for discharge tomorrow morning if her labs are stable or improved, and if she tolerates her diet without significant pain. 2. When she goes home, I would probably keep her on antibiotic coverage for the next week to help prevent cholangitis. 3. I personally advocate the concurrent use of Florastor probiotic therapy, one capsule twice a day, while on antibiotics, to help prevent the risk of C. difficile infection and antibiotic associated diarrhea. However, this is optional. 4. I have arranged for the patient to have an ERCP for the purpose of removing her common duct stone at [facility] on Friday, December 28. The patient knows to report to the main hospital at [facility] at 7 AM on that date. The contact physician there is Dr. Olen Pel and the main hospital taking operator number is [phone]. 5. The patient knows that, if she should become sick between now and the time of her ERCP a week from now, she should proceed to the emergency room at the [facility] or, if she is very sick and unable to make the trip to [facility], she should report to the emergency room here. She also knows she can call our office number, [phone], if she has any questions. 6. The physician at [facility] has requested that the patient had an MRI cholangiogram to confirm the persistence of a stone in the common duct, so she does  not undergo a procedure when it is possibly the case that the stone has already passed. This will be arranged prior to discharge, hopefully this evening. 7. I have arranged for the patient to have her diet advanced at night, and have followup blood work in the morning.  Total time a ranging the above tests, in talking with the patient and her family was approximately 45 minutes.  Florencia Reasons, M.D. [phone]

## 2011-07-03 NOTE — Op Note (Signed)
[facility] [address] [place], [place]  [address]  ERCP PROCEDURE REPORT  PATIENT:  [name], [name]  MR#:  [id number] BIRTHDATE:  [birth year]  GENDER:  female  ENDOSCOPIST:  Willis Modena, MD REFERRING:  Dr. Carolynne Edouard  PROCEDURE DATE:  07/03/2011 PROCEDURE:  Endoscopy (failed ERCP). ASA CLASS:  II  INDICATIONS:  CBD stone  MEDICATIONS: Cipro 400 mg iv, General endotracheal anesthesia  DESCRIPTION OF PROCEDURE:   After the risks benefits and alternatives of the procedure were thoroughly explained, informed consent was obtained.  The  endoscope was introduced through the mouth and advanced to the .  The examination was normal with no endoscopic findings.  The scope was then completely withdrawn from the patient and the procedure terminated.  FINDINGS:  Patulous ampulla, with good bile flow pre- and post-procedure.  Despite use of several different sphincterotomes, I was unable to cannulate the bile duct.  ENDOSCOPIC IMPRESSION:    1.  Failed ERCP. 2.  Patulous ampulla with good bile flow pre- and post-procedure.  RECOMMENDATIONS:      1.  Watch for potential complications of procedure. 2.  PTC versus tertiary center ERCP. 3.  Case discussed with Dr. Matthias Hughs.  ______________________________ Willis Modena  CC:  n. eSIGNED:   Willis Modena at 07/03/2011 01:20 PM  Johney Frame, [phone]

## 2011-07-03 NOTE — Interval H&P Note (Signed)
History and Physical Interval Note:  07/03/2011 11:02 AM  [name]  has presented today for surgery, with the diagnosis of COMMON BOWEL DUCT STONE  The various methods of treatment have been discussed with the patient and family. After consideration of risks, benefits and other options for treatment, the patient has consented to  Procedure(s): ENDOSCOPIC RETROGRADE CHOLANGIOPANCREATOGRAPHY (ERCP) as a surgical intervention .  The patients' history has been reviewed, patient examined, no change in status, stable for surgery.  I have reviewed the patients' chart and labs.  Questions were answered to the patient's satisfaction.     Merick Kelleher M  Intraoperative cholangiogram abnormal (CBD stone).  Failed ERCP couple days ago by Dr. Ewing Schlein.  We will retry ERCP today.  If unsuccessful, will need either PTC assistance for biliary access versus ERCP at tertiary center.  Risks (up to and including bleeding, infection, perforation, pancreatitis that can be complicated by infected necrosis and death), benefits (removal of stones, alleviating blockage, decreasing risk of cholangitis or choledocholithiasis-related pancreatitis), and alternatives (watchful waiting, percutaneous transhepatic cholangiography) of ERCP were explained to patient in detail and patient elects to proceed.

## 2011-07-03 NOTE — Brief Op Note (Signed)
ERCP attempted.  Ampulla patulous with copious bile flow through ampulla before and after procedure. Despite use of general anesthesia, and use of several different sphincterotomes, I was not able to cannulate the bile duct.  Procedure stopped.

## 2011-07-03 NOTE — Anesthesia Preprocedure Evaluation (Signed)
Anesthesia Evaluation  Patient identified by MRN, date of birth, ID band Patient awake    Reviewed: Allergy & Precautions, H&P , NPO status , Patient's Chart, lab work & pertinent test results, reviewed documented beta blocker date and time   Airway       Dental   Pulmonary shortness of breath and with exertion, asthma ,  clear to auscultation        Cardiovascular neg cardio ROS Regular Normal- Systolic murmurs    Neuro/Psych Negative Neurological ROS  Negative Psych ROS   GI/Hepatic Neg liver ROS, GERD-  Controlled,  Endo/Other  Morbid obesity  Renal/GU negative Renal ROS     Musculoskeletal   Abdominal   Peds  Hematology   Anesthesia Other Findings   Reproductive/Obstetrics                           Anesthesia Physical Anesthesia Plan  ASA: III  Anesthesia Plan: MAC   Post-op Pain Management:    Induction: Intravenous  Airway Management Planned: Simple Face Mask  Additional Equipment:   Intra-op Plan:   Post-operative Plan:   Informed Consent: I have reviewed the patients History and Physical, chart, labs and discussed the procedure including the risks, benefits and alternatives for the proposed anesthesia with the patient or authorized representative who has indicated his/her understanding and acceptance.     Plan Discussed with: CRNA, Anesthesiologist and Surgeon  Anesthesia Plan Comments:         Anesthesia Quick Evaluation

## 2011-07-03 NOTE — Transfer of Care (Signed)
Immediate Anesthesia Transfer of Care Note  Patient: [name]  Procedure(s) Performed:  ENDOSCOPIC RETROGRADE CHOLANGIOPANCREATOGRAPHY (ERCP) - PROPOFOL  Patient Location: PACU  Anesthesia Type: General  Level of Consciousness: awake, alert , oriented and patient cooperative  Airway & Oxygen Therapy: Patient Spontanous Breathing and Patient connected to nasal cannula oxygen  Post-op Assessment: Report given to PACU RN, Post -op Vital signs reviewed and stable and Patient moving all extremities  Post vital signs: Reviewed and stable  Complications: No apparent anesthesia complications

## 2011-07-03 NOTE — Preoperative (Signed)
Beta Blockers   Reason not to administer Beta Blockers:Not Applicable 

## 2011-07-03 NOTE — Progress Notes (Signed)
Pt ok. WBC slightly up. LFTs decreased. For repeat ERCP today Await findings. Recheck after.,

## 2011-07-03 NOTE — Progress Notes (Signed)
Day of Surgery  Subjective: Pt back from Endo/OR Not able to complete ERCP.  Objective: Vital signs in last 24 hours: Temp:  [97.9 F (36.6 C)-98.5 F (36.9 C)] 98.1 F (36.7 C) (12/21 1500) Pulse Rate:  [74-110] 88  (12/21 1500) Resp:  [18-27] 20  (12/21 1500) BP: (117-170)/(66-118) 170/85 mmHg (12/21 1500) SpO2:  [93 %-100 %] 98 % (12/21 1500) Last BM Date: 07/01/11  Intake/Output this shift: Total I/O In: 1800 [I.V.:1800] Out: 10 [Blood:10]  Physical Exam: BP 170/85  Pulse 88  Temp(Src) 98.1 F (36.7 C) (Oral)  Resp 20  Ht 5\' 2"  (1.575 m)  Wt 250 lb (113.399 kg)  BMI 45.73 kg/m2  SpO2 98%  LMP 06/19/2011 Lungs: CTA without w/r/r Heart: Regular Abdomen: soft, ND, appropriately tender   Incisions all c/d/i without erythema or hematoma. Ext: No edema or tenderness   Labs: CBC  Basename 07/03/11 0700 07/02/11 1631  WBC 16.8* 13.9*  HGB 8.8* 9.2*  HCT 30.6* 31.9*  PLT 587* 595*   BMET  Basename 07/03/11 0700 07/02/11 1631  NA 135 137  K 3.4* 3.0*  CL 98 99  CO2 27 29  GLUCOSE 79 91  BUN 8 9  CREATININE 0.85 0.85  CALCIUM 9.3 8.9   LFT  Basename 07/03/11 0700 07/02/11 0625  PROT 7.2 --  ALBUMIN 2.9* --  AST 185* --  ALT 379* --  ALKPHOS 150* --  BILITOT 0.7 --  BILIDIR -- --  IBILI -- --  LIPASE -- 21   PT/INR No results found for this basename: LABPROT:2,INR:2 in the last 72 hours ABG No results found for this basename: PHART:2,PCO2:2,PO2:2,HCO3:2 in the last 72 hours  Studies/Results: Dg C-arm 1-60 Min-no Report  07/03/2011  CLINICAL DATA: ERCP   C-ARM 1-60 MINUTES  Fluoroscopy was utilized by the requesting physician.  No radiographic  interpretation.      Assessment: Principal Problem:  *Cholecystitis   Procedure(s): ENDOSCOPIC RETROGRADE CHOLANGIOPANCREATOGRAPHY (ERCP)  Plan: Bili normal, transaminases stable Will d/w GI, but suspect ok to DC home tomorrow with outpt follow up as indicated.  LOS: 7 days     Marianna Fuss 07/03/2011

## 2011-07-03 NOTE — Anesthesia Postprocedure Evaluation (Signed)
Anesthesia Post Note  Patient: [name]  Procedure(s) Performed:  ENDOSCOPIC RETROGRADE CHOLANGIOPANCREATOGRAPHY (ERCP) - PROPOFOL  Anesthesia type: general  Patient location: PACU  Post pain: Pain level controlled  Post assessment: Patient's Cardiovascular Status Stable  Last Vitals:  Filed Vitals:   07/03/11 1349  BP: 146/95  Pulse: 86  Temp: 36.6 C  Resp: 27    Post vital signs: Reviewed and stable  Level of consciousness: sedated  Complications: No apparent anesthesia complications

## 2011-07-04 ENCOUNTER — Inpatient Hospital Stay (HOSPITAL_COMMUNITY): Payer: Medicare Other

## 2011-07-04 LAB — DIFFERENTIAL
Basophils Absolute: 0 10*3/uL (ref 0.0–0.1)
Basophils Relative: 0 % (ref 0–1)
Eosinophils Absolute: 0.3 10*3/uL (ref 0.0–0.7)
Neutro Abs: 6.7 10*3/uL (ref 1.7–7.7)
Neutrophils Relative %: 61 % (ref 43–77)

## 2011-07-04 LAB — COMPREHENSIVE METABOLIC PANEL
Albumin: 2.9 g/dL — ABNORMAL LOW (ref 3.5–5.2)
Alkaline Phosphatase: 176 U/L — ABNORMAL HIGH (ref 39–117)
BUN: 9 mg/dL (ref 6–23)
Chloride: 97 mEq/L (ref 96–112)
Creatinine, Ser: 1.03 mg/dL (ref 0.50–1.10)
GFR calc Af Amer: 80 mL/min — ABNORMAL LOW (ref >90)
GFR calc non Af Amer: 69 mL/min — ABNORMAL LOW (ref >90)
Glucose, Bld: 106 mg/dL — ABNORMAL HIGH (ref 70–99)
Potassium: 3.2 mEq/L — ABNORMAL LOW (ref 3.5–5.1)
Total Bilirubin: 1.9 mg/dL — ABNORMAL HIGH (ref 0.3–1.2)

## 2011-07-04 LAB — CBC
HCT: 31.4 % — ABNORMAL LOW (ref 36.0–46.0)
Hemoglobin: 8.5 g/dL — ABNORMAL LOW (ref 12.0–15.0)
MCV: 59.5 fL — ABNORMAL LOW (ref 78.0–100.0)
RDW: 23.1 % — ABNORMAL HIGH (ref 11.5–15.5)
WBC: 11 10*3/uL — ABNORMAL HIGH (ref 4.0–10.5)

## 2011-07-04 MED ORDER — FLUCONAZOLE 150 MG PO TABS: 150.0000 mg | ORAL_TABLET | Freq: Once | ORAL | Status: AC

## 2011-07-04 MED ORDER — OMEPRAZOLE 20 MG PO CPDR: 20.0000 mg | DELAYED_RELEASE_CAPSULE | Freq: Every day | ORAL | Status: DC

## 2011-07-04 MED ORDER — ALBUTEROL SULFATE (5 MG/ML) 0.5% IN NEBU: 2.5000 mg | INHALATION_SOLUTION | Freq: Three times a day (TID) | RESPIRATORY_TRACT | Status: DC

## 2011-07-04 MED ORDER — SACCHAROMYCES BOULARDII 250 MG PO CAPS: 250.0000 mg | ORAL_CAPSULE | Freq: Two times a day (BID) | ORAL | Status: AC

## 2011-07-04 MED ORDER — RANITIDINE HCL 75 MG PO TABS: 75.0000 mg | ORAL_TABLET | Freq: Two times a day (BID) | ORAL | Status: DC

## 2011-07-04 MED ORDER — HYDROCODONE-ACETAMINOPHEN 5-325 MG PO TABS: 1.0000 | ORAL_TABLET | Freq: Four times a day (QID) | ORAL | Status: AC | PRN

## 2011-07-04 MED ORDER — LEVOFLOXACIN 500 MG PO TABS: 750.0000 mg | ORAL_TABLET | Freq: Every day | ORAL | Status: AC

## 2011-07-04 NOTE — Progress Notes (Signed)
GASTROENTEROLOGY PROGRESS NOTE  Problem:   CBD stone   Subjective: Still some discomfort, but feels ready to go home.  Objective: NAD.  Afebr.  WBC improved--11  LFT's stable.  MRCP:  Persistent distal CBD stone (d/w Radiologist)  Assessment: Pt is in satisfactory condition for discharge. She knows what to do if she gets recurrent sx.  Plan: Agree w/ plan for dischg. While awaiting ERCP at [facility] in 6 days. Would Rx w/ PPI in place of Zantac for a month until we get upper abd sx sorted out. Pt advised to consider FloraStor while on antbx.   [name], M.D. 07/04/2011 4:13 PM

## 2011-07-04 NOTE — Progress Notes (Signed)
Pt feels ok. Denies pain. Mild soreness.  BP 135/68  Pulse 91  Temp(Src) 98.6 F (37 C) (Oral)  Resp 18  Ht 5\' 2"  (1.575 m)  Wt 250 lb (113.399 kg)  BMI 45.73 kg/m2  SpO2 94%  LMP 06/19/2011 Abdomen: soft, NT, incision  Bili up to 1.9 MRCP ordered Await results.

## 2011-07-04 NOTE — Discharge Summary (Signed)
Agree [name]  

## 2011-07-04 NOTE — Discharge Summary (Signed)
Physician Discharge Summary  Patient ID: [name] MRN: [id number] DOB/AGE: 1976/01/16 35 y.o.  Admit date: 06/26/2011 Discharge date: 07/04/2011  Admission Diagnoses: Cholecystits Discharge Diagnoses:  Principal Problem:  *Cholecystitis Choledocholithiasis Bronchiti  Procedure(s): ENDOSCOPIC RETROGRADE CHOLANGIOPANCREATOGRAPHY (ERCP)  Discharged Condition: good  Hospital Course: HPI: with a history of nausea and vomiting for the past week off and on. She has had bronchitis for the past month and was febrile and coughing with this, but the N/V was new this past week. Last night she then developed pain in her upper abdomen which has not gone away even after several doses of Dilaudid and she continues to feel nauseated, but has not vomited. Her workup found gallstones and mild wall thickening as well an apparent common bile duct stone. Her enzymes were only marginally elevated and the decision was made to admit the pt for planned cholecystectomy. However, with her bronchitis, the medicine team was asked to treat to reduce her peri-operative risks. This was treated appropriately. She then went to the OR on 12/17 and underwent lap chole. Her IOC showed no filling of the duodenum and evidence of distal CBD obstruction. GI was consulted and ERCP was planned for 12/18, however, at that time, they were not able to cannulate the ampula. The pt felt ok, her pain was improving but her Bilirubin level rose. The GI team re-attempted ERCP on 12/21 but again, were not able to get into the duct. The GI plan was to refer her to [facility] but an MRCP had been ordered. This appeared to show continued presence of a distal common duct stone causing partial obstruction.. The pt Bili however remains at 1.9. We feel she is stable for DC with planned follow up as indicated.  FOR DISCHARGE:  1. I have placed home health order for nebulizer machine. Please give prescription for albuterol nebulizer solution,  2.5 mg 3 times a day prn (i instructed her to continue TID for next few days)  2. Continue Symbicort 1 puff BID, singulair (HOME meds)  3. Give prescription for Medrol Pack, dose 1, the time of discharge, avelox 400mg  daily for 5 days  4. Give prescription for Iron 325mg  daily  5. I made her appt with [facility], Dr. Sherene Sires on December 24, Monday at 11 AM. Patient will have to call the office to reschedule if she cannot make it.  3. anemia with a baseline hemoglobin around 8  - S/P one unit of packed RBCs, hemoglobin stable. Counseled patient to followup with her OB-GYN after discharge.   Consults: GI   Discharge Exam: Blood pressure 145/65, pulse 104, temperature 98.8 F (37.1 C), temperature source Oral, resp. rate 20, height 5\' 2"  (1.575 m), weight 113.399 kg (250 lb), last menstrual period 06/19/2011, SpO2 100.00%. Lungs: CTA without w/r/r Heart: Regular Abdomen: soft, ND, appropriately tender   Incisions all c/d/i without erythema or hematoma. Ext: No edema or tenderness   Disposition: Home or Self Care  Discharge Orders    Future Appointments: Provider: Department: Dept Phone: Center:   07/06/2011 11:15 AM Sandrea Hughs, MD [facility] [phone] None     Current Discharge Medication List    CONTINUE these medications which have NOT CHANGED   Details  albuterol (PROVENTIL HFA;VENTOLIN HFA) 108 (90 BASE) MCG/ACT inhaler Inhale 1-2 puffs into the lungs every 6 (six) hours as needed for wheezing. Qty: 1 Inhaler, Refills: 0    budesonide-formoterol (SYMBICORT) 160-4.5 MCG/ACT inhaler Inhale 2 puffs into the lungs 2 (two) times  daily.      montelukast (SINGULAIR) 10 MG tablet Take 10 mg by mouth at bedtime.        STOP taking these medications     guaiFENesin (ROBITUSSIN) 100 MG/5ML SOLN      ranitidine (ZANTAC) 75 MG tablet        Follow-up Information    Follow up with Sandrea Hughs, MD on 07/06/2011. (at 11:00 AM)    Contact information:   [address]  [facility] [address] [place] [address] [place] [address] [phone]          Signed: Marianna Fuss 07/04/2011, 3:15 PM

## 2011-07-06 ENCOUNTER — Emergency Department (HOSPITAL_COMMUNITY): Payer: Medicare Other

## 2011-07-06 ENCOUNTER — Encounter (HOSPITAL_COMMUNITY): Payer: Self-pay | Admitting: Emergency Medicine

## 2011-07-06 ENCOUNTER — Emergency Department (HOSPITAL_COMMUNITY)
Admission: EM | Admit: 2011-07-06 | Discharge: 2011-07-06 | Disposition: A | Discharge status: Home / Self Care | Payer: Medicare Other | Attending: General Surgery | Admitting: General Surgery

## 2011-07-06 ENCOUNTER — Institutional Professional Consult (permissible substitution): Payer: Medicare Other | Admitting: Internal Medicine

## 2011-07-06 DIAGNOSIS — K219 Gastro-esophageal reflux disease without esophagitis: Secondary | ICD-10-CM | POA: Insufficient documentation

## 2011-07-06 DIAGNOSIS — R630 Anorexia: Secondary | ICD-10-CM | POA: Insufficient documentation

## 2011-07-06 DIAGNOSIS — R112 Nausea with vomiting, unspecified: Secondary | ICD-10-CM | POA: Insufficient documentation

## 2011-07-06 DIAGNOSIS — K8050 Calculus of bile duct without cholangitis or cholecystitis without obstruction: Secondary | ICD-10-CM | POA: Insufficient documentation

## 2011-07-06 DIAGNOSIS — J45909 Unspecified asthma, uncomplicated: Secondary | ICD-10-CM | POA: Insufficient documentation

## 2011-07-06 DIAGNOSIS — R1084 Generalized abdominal pain: Secondary | ICD-10-CM | POA: Insufficient documentation

## 2011-07-06 DIAGNOSIS — Z79899 Other long term (current) drug therapy: Secondary | ICD-10-CM | POA: Insufficient documentation

## 2011-07-06 LAB — COMPREHENSIVE METABOLIC PANEL
Albumin: 3 g/dL — ABNORMAL LOW (ref 3.5–5.2)
Alkaline Phosphatase: 164 U/L — ABNORMAL HIGH (ref 39–117)
BUN: 7 mg/dL (ref 6–23)
Potassium: 3.4 mEq/L — ABNORMAL LOW (ref 3.5–5.1)
Total Protein: 7.4 g/dL (ref 6.0–8.3)

## 2011-07-06 LAB — LIPASE, BLOOD: Lipase: 23 U/L (ref 11–59)

## 2011-07-06 LAB — CBC
Hemoglobin: 9.1 g/dL — ABNORMAL LOW (ref 12.0–15.0)
MCH: 17.1 pg — ABNORMAL LOW (ref 26.0–34.0)
MCHC: 28.6 g/dL — ABNORMAL LOW (ref 30.0–36.0)
Platelets: 550 10*3/uL — ABNORMAL HIGH (ref 150–400)
RDW: 23.2 % — ABNORMAL HIGH (ref 11.5–15.5)

## 2011-07-06 LAB — DIFFERENTIAL
Basophils Relative: 0 % (ref 0–1)
Eosinophils Absolute: 0.7 10*3/uL (ref 0.0–0.7)
Monocytes Relative: 8 % (ref 3–12)
Neutrophils Relative %: 66 % (ref 43–77)

## 2011-07-06 LAB — URINALYSIS, ROUTINE W REFLEX MICROSCOPIC
Nitrite: POSITIVE — AB
Specific Gravity, Urine: 1.029 (ref 1.005–1.030)
pH: 7 (ref 5.0–8.0)

## 2011-07-06 LAB — URINE MICROSCOPIC-ADD ON

## 2011-07-06 MED ORDER — ONDANSETRON HCL 4 MG/2ML IJ SOLN: 4.0000 mg | INTRAMUSCULAR | Status: DC | PRN

## 2011-07-06 MED ORDER — ONDANSETRON 8 MG PO TBDP: 8.0000 mg | ORAL_TABLET | Freq: Three times a day (TID) | ORAL | Status: AC | PRN

## 2011-07-06 MED ORDER — MORPHINE SULFATE 4 MG/ML IJ SOLN: 4.0000 mg | INTRAMUSCULAR | Status: DC | PRN

## 2011-07-06 MED ORDER — SODIUM CHLORIDE 0.9 % IV SOLN
INTRAVENOUS | Status: DC
  Administered 2011-07-06: 1000 mL via INTRAVENOUS

## 2011-07-06 NOTE — ED Notes (Signed)
MD at bedside. 

## 2011-07-06 NOTE — ED Notes (Signed)
Pt states had gallbladder removed on Monday. Pt admitted to hospital d/t gallstone in bile duct. Pt states discharged on sat. Pt states tonight had 3 episodes of hematemesis. Pt denies pain. Pt resting quietly in bed.

## 2011-07-06 NOTE — Consult Note (Signed)
Reason for Consult:N/V  Referring Physician: McManus/Knapp   HPI: [name] is an 35 y.o. female who recently had lap chole. She had retained CBD stone with 2 unsuccessful attempts at ERCP. MRCP confirmed distal stones, with constriction. She had outpt plans for ERCP at [facility] later this week, but is now having recurrent N/V. She came back to [facility] for re-eval.  Past Medical History:  Past Medical History  Diagnosis Date  . Asthma   . Environmental allergies   . Fibroids   . Shortness of breath   . GERD (gastroesophageal reflux disease)   . Anemia     Surgical History:  Past Surgical History  Procedure Date  . Ectopic pregnancy surgery   . Uterine fibroid surgery   . Cholecystectomy     06/29/2011  . Cholecystectomy 06/27/2011    Procedure: LAPAROSCOPIC CHOLECYSTECTOMY WITH INTRAOPERATIVE CHOLANGIOGRAM;  Surgeon: Atilano Ina, MD;  Location: [facility] OR;  Service: General;  Laterality: N/A;  . Cholecystectomy 06/29/2011    Procedure: LAPAROSCOPIC CHOLECYSTECTOMY WITH INTRAOPERATIVE CHOLANGIOGRAM;  Surgeon: Robyne Askew, MD;  Location: [facility] OR;  Service: General;  Laterality: N/A;    Family History:  Family History  Problem Relation Age of Onset  . Anesthesia problems Neg Hx   . Hypotension Neg Hx   . Malignant hyperthermia Neg Hx   . Pseudochol deficiency Neg Hx     Social History:  reports that she has never smoked. She does not have any smokeless tobacco history on file. She reports that she does not drink alcohol or use illicit drugs.  Allergies:  Allergies  Allergen Reactions  . Bee Venom Anaphylaxis    Patient has an epi pen, also has not been stung before but was informed she is allergic  . Prednisone Hives    Medications: I have reviewed the patient's current medications.  ROS: See HPI for pertinent findings, otherwise complete 10 system review negative.  Physical Exam: Blood pressure 136/91, pulse 85, temperature 98.7 F (37.1 C), temperature source  Oral, resp. rate 20, last menstrual period 06/19/2011, SpO2 96.00%.  General Appearance:  Alert, cooperative, no distress, appears stated age  Lungs: CTA without w/r/r Heart: Regular Abdomen: soft, ND, appropriately tender   Incisions all c/d/i without erythema or hematoma. Ext: No edema or tenderness    Labs: CBC  Basename 07/06/11 0302 07/04/11 0600  WBC 11.7* 11.0*  HGB 9.1* 8.5*  HCT 31.8* 31.4*  PLT 550* 582*   MET  Basename 07/06/11 0302 07/04/11 0600  NA 136 135  K 3.4* 3.2*  CL 99 97  CO2 28 29  GLUCOSE 102* 106*  BUN 7 9  CREATININE 0.83 1.03  CALCIUM 9.8 9.1    Basename 07/06/11 0302  PROT 7.4  ALBUMIN 3.0*  AST 221*  ALT 344*  ALKPHOS 164*  BILITOT 2.4*  BILIDIR --  IBILI --  LIPASE 23   PT/INR No results found for this basename: LABPROT:2,INR:2 in the last 72 hours ABG No results found for this basename: PHART:2,PCO2:2,PO2:2,HCO3:2 in the last 72 hours    Mr Mrcp  07/04/2011  *RADIOLOGY REPORT*  Clinical Data:  The patient had  cholecystectomy.  Concern for common bile duct stone.  No successful cholecystectomy.  MRI ABDOMEN WITHOUT CONTRAST (MRCP)  Technique: Multiplanar multisequence MR imaging of the abdomen was performed, including heavily T2-weighted images of the biliary and pancreatic ducts.  Three-dimensional MR images were rendered by post processing of the original MR data.  Comparison:  Intraoperative cholangiogram  06/29/2011  Findings:  The common bile duct and the common hepatic duct are dilated with the common bile duct measuring 13 mm and the common hepatic duct measuring 13 mm.  This ductal dilatation extends into the extrahepatic left hepatic duct and minimally into the right ductal system.  There is constriction of the distal common bile duct over an approximately 11 mm segment.  This is best seen on coronal image 15 of series 4.  The duct measures approximately 5 mm through this region of constriction.  At the distal aspect of this  constricted distal gallbladder there is a filling defect just proximal to the ampulla which measures 7 mm (coronal image 16 of series 13).  There is no focal hepatic lesion.  The pancreas is normal without evidence of ductal dilatation.  The adrenal glands and kidneys appear normal.  The stomach and limited view of the bowel are normal.  IMPRESSION:  1. Short segment of constriction of the distal common bile duct over 1 cm segment with a  filling defect within the distal aspect of this constriction. These findings are similar to comparison intraoperative cholangiogram.  2.  Significant dilatation of the biliary tree proximal to this constriction with the common bile duct and common hepatic duct measuring up to 13 mm.  Original Report Authenticated By: Genevive Bi, M.D.   Mr 3d Recon At Scanner  07/04/2011  *RADIOLOGY REPORT*  Clinical Data:  The patient had  cholecystectomy.  Concern for common bile duct stone.  No successful cholecystectomy.  MRI ABDOMEN WITHOUT CONTRAST (MRCP)  Technique: Multiplanar multisequence MR imaging of the abdomen was performed, including heavily T2-weighted images of the biliary and pancreatic ducts.  Three-dimensional MR images were rendered by post processing of the original MR data.  Comparison:  Intraoperative cholangiogram 06/29/2011  Findings:  The common bile duct and the common hepatic duct are dilated with the common bile duct measuring 13 mm and the common hepatic duct measuring 13 mm.  This ductal dilatation extends into the extrahepatic left hepatic duct and minimally into the right ductal system.  There is constriction of the distal common bile duct over an approximately 11 mm segment.  This is best seen on coronal image 15 of series 4.  The duct measures approximately 5 mm through this region of constriction.  At the distal aspect of this constricted distal gallbladder there is a filling defect just proximal to the ampulla which measures 7 mm (coronal image 16 of  series 13).  There is no focal hepatic lesion.  The pancreas is normal without evidence of ductal dilatation.  The adrenal glands and kidneys appear normal.  The stomach and limited view of the bowel are normal.  IMPRESSION:  1. Short segment of constriction of the distal common bile duct over 1 cm segment with a  filling defect within the distal aspect of this constriction. These findings are similar to comparison intraoperative cholangiogram.  2.  Significant dilatation of the biliary tree proximal to this constriction with the common bile duct and common hepatic duct measuring up to 13 mm.  Original Report Authenticated By: Genevive Bi, M.D.   Dg Abd Acute W/chest  07/06/2011  *RADIOLOGY REPORT*  Clinical Data: Abdominal pain, shortness of breath and vomiting. Assess for free intra-abdominal air.  ACUTE ABDOMEN SERIES (ABDOMEN 2 VIEW & CHEST 1 VIEW)  Comparison: Chest and abdominal radiographs performed 02/08/2010, and chest radiograph performed 06/26/2011; MRCP performed 07/04/2011  Findings: The lungs are well-aerated.  Mild bibasilar linear opacities  likely reflect atelectasis.  There is no evidence of pleural effusion or pneumothorax.  The cardiomediastinal silhouette is borderline normal in size.  The visualized bowel gas pattern is unremarkable.  Stool and air are noted throughout the colon; there is no evidence of small bowel dilatation to suggest obstruction.  No free intra-abdominal air is identified on the provided upright view.  No acute osseous abnormalities are seen; the sacroiliac joints are unremarkable in appearance.  Clips are noted within the right upper quadrant, reflecting prior cholecystectomy.  IMPRESSION:  1.  Unremarkable bowel gas pattern; no free intra-abdominal air seen. 2.  Mild bibasilar linear airspace opacities likely reflect atelectasis.  Original Report Authenticated By: Tonia Ghent, M.D.    Assessment/Plan: Choledocholithiasis N/V Bili up some since last  discharge Will give her some antiemetics to get her by and she will contact [facility] and advise that she will coming there early. Plan put in place by GI last week:  I have arranged for the patient to have an ERCP for the purpose of removing her common duct stone at [facility] on Friday, December 28. The patient knows to report to the main hospital at [facility] at 7 AM on that date. The contact physician there is Dr. Olen Pel and the main hospital taking operator number is [phone].    The patient knows that, if she should become sick between now and the time of her ERCP a week from now, she should proceed to the emergency room at the [facility] or, if she is very sick and unable to make the trip to [facility], she should report to the emergency room here. She also knows she can call our office number, [phone], if she has any questions.    The physician at [facility] has requested that the patient had an MRI cholangiogram to confirm the persistence of a stone in the common duct, so she does not undergo a procedure when it is possibly the case that the stone has already passed. This will be arranged prior to discharge, hopefully this evening.   Marianna Fuss 07/06/2011, 9:25 AM

## 2011-07-06 NOTE — ED Provider Notes (Signed)
History     CSN: [id number]  Arrival date & time 07/06/11  4098   Chief Complaint  Patient presents with  . Emesis    pt states had gallbladder removed last monday. pt c/o hemataemesis x3 today.     HPI Pt was seen at 0245.  Per pt and family, c/o gradual onset and persistence of constant generalized abd pain, as well as several intermittent episodes of N/V that began this afternoon.  Pt has been unable to tol PO due to N/V.  Pt had her gallbladder removed last week (was discharged from the hospital 07/04/11), but still has a retained CBD stone after unsuccessful ERCP x2.  Pt states she is due for ERCP at [facility] this week (Friday).  Denies fevers, no rash, no back pain, no CP/SOB, no diarrhea.     Past Medical History  Diagnosis Date  . Asthma   . Environmental allergies   . Fibroids   . Shortness of breath   . GERD (gastroesophageal reflux disease)   . Anemia     Past Surgical History  Procedure Date  . Ectopic pregnancy surgery   . Uterine fibroid surgery   . Cholecystectomy     06/29/2011  . Cholecystectomy 06/27/2011    Procedure: LAPAROSCOPIC CHOLECYSTECTOMY WITH INTRAOPERATIVE CHOLANGIOGRAM;  Surgeon: Atilano Ina, MD;  Location: [facility] OR;  Service: General;  Laterality: N/A;  . Cholecystectomy 06/29/2011    Procedure: LAPAROSCOPIC CHOLECYSTECTOMY WITH INTRAOPERATIVE CHOLANGIOGRAM;  Surgeon: Robyne Askew, MD;  Location: [facility] OR;  Service: General;  Laterality: N/A;    Family History  Problem Relation Age of Onset  . Anesthesia problems Neg Hx   . Hypotension Neg Hx   . Malignant hyperthermia Neg Hx   . Pseudochol deficiency Neg Hx     History  Substance Use Topics  . Smoking status: Never Smoker   . Smokeless tobacco: Not on file  . Alcohol Use: No    Review of Systems ROS: Statement: All systems negative except as marked or noted in the HPI; Constitutional: Negative for fever and chills. ; ; Eyes: Negative for eye pain, redness and discharge. ; ; ENMT:  Negative for ear pain, hoarseness, nasal congestion, sinus pressure and sore throat. ; ; Cardiovascular: Negative for chest pain, palpitations, diaphoresis, dyspnea and peripheral edema. ; ; Respiratory: Negative for cough, wheezing and stridor. ; ; Gastrointestinal: +N/V, abd pain.  Negative for diarrhea, blood in stool, hematemesis, jaundice and rectal bleeding. . ; ; Genitourinary: Negative for dysuria, flank pain and hematuria. ; ; Musculoskeletal: Negative for back pain and neck pain. Negative for swelling and trauma.; ; Skin: Negative for pruritus, rash, abrasions, blisters, bruising and skin lesion.; ; Neuro: Negative for headache, lightheadedness and neck stiffness. Negative for weakness, altered level of consciousness , altered mental status, extremity weakness, paresthesias, involuntary movement, seizure and syncope.     Allergies  Bee venom and Prednisone  Home Medications   Current Outpatient Rx  Name Route Sig Dispense Refill  . ALBUTEROL SULFATE HFA 108 (90 BASE) MCG/ACT IN AERS Inhalation Inhale 1-2 puffs into the lungs every 6 (six) hours as needed for wheezing. 1 Inhaler 0  . ALBUTEROL SULFATE (5 MG/ML) 0.5% IN NEBU Nebulization Take 0.5 mLs (2.5 mg total) by nebulization 3 (three) times daily. 20 mL 4  . BUDESONIDE-FORMOTEROL FUMARATE 160-4.5 MCG/ACT IN AERO Inhalation Inhale 2 puffs into the lungs 2 (two) times daily.      Marland Kitchen FLUCONAZOLE 150 MG PO TABS Oral Take 1  tablet (150 mg total) by mouth once. 3 tablet 2    May repeat every 3 days if needed again.  Marland Kitchen HYDROCODONE-ACETAMINOPHEN 5-325 MG PO TABS Oral Take 1-2 tablets by mouth every 6 (six) hours as needed for pain. 30 tablet 1  . LEVOFLOXACIN 500 MG PO TABS Oral Take 1.5 tablets (750 mg total) by mouth daily. 14 tablet 0  . MONTELUKAST SODIUM 10 MG PO TABS Oral Take 10 mg by mouth at bedtime.      . OMEPRAZOLE 20 MG PO CPDR Oral Take 1 capsule (20 mg total) by mouth daily. 30 capsule 1  . RANITIDINE HCL 75 MG PO TABS Oral  Take 1 tablet (75 mg total) by mouth 2 (two) times daily. For heartburn    . SACCHAROMYCES BOULARDII 250 MG PO CAPS Oral Take 1 capsule (250 mg total) by mouth 2 (two) times daily. 30 capsule 0    BP 150/95  Pulse 82  Temp(Src) 99 F (37.2 C) (Oral)  Resp 20  SpO2 98%  LMP 06/19/2011  Physical Exam 0250: Physical examination:  Nursing notes reviewed; Vital signs and O2 SAT reviewed;  Constitutional: Well developed, Well nourished, Uncomfortable appearing; Head:  Normocephalic, atraumatic; Eyes: EOMI, PERRL, No scleral icterus; ENMT: Mouth and pharynx normal, Mucous membranes dry; Neck: Supple, Full range of motion, No lymphadenopathy; Cardiovascular: Regular rate and rhythm, No murmur, rub, or gallop; Respiratory: Breath sounds clear & equal bilaterally, No rales, rhonchi, wheezes, or rub, Normal respiratory effort/excursion; Chest: Nontender, Movement normal; Abdomen: Soft, +generalized tenderness to palp, multiple well healing scars on abd wall without drainage or erythema, Nondistended, Normal bowel sounds; Genitourinary: No CVA tenderness; Extremities: Pulses normal, No tenderness, No edema, No calf edema or asymmetry.; Neuro: AA&Ox3, Major CN grossly intact.  No gross focal motor or sensory deficits in extremities.; Skin: Color normal, Warm, Dry   ED Course  Procedures   MDM  MDM Reviewed: previous chart, nursing note and vitals Reviewed previous: labs Interpretation: labs and x-ray   Results for orders placed during the hospital encounter of 07/06/11  CBC      Component Value Range   WBC 11.7 (*) 4.0 - 10.5 (K/uL)   RBC 5.33 (*) 3.87 - 5.11 (MIL/uL)   Hemoglobin 9.1 (*) 12.0 - 15.0 (g/dL)   HCT 09.8 (*) 11.9 - 46.0 (%)   MCV 59.7 (*) 78.0 - 100.0 (fL)   MCH 17.1 (*) 26.0 - 34.0 (pg)   MCHC 28.6 (*) 30.0 - 36.0 (g/dL)   RDW 14.7 (*) 82.9 - 15.5 (%)   Platelets 550 (*) 150 - 400 (K/uL)  DIFFERENTIAL      Component Value Range   Neutrophils Relative 66  43 - 77 (%)    Neutro Abs 7.7  1.7 - 7.7 (K/uL)   Lymphocytes Relative 20  12 - 46 (%)   Lymphs Abs 2.3  0.7 - 4.0 (K/uL)   Monocytes Relative 8  3 - 12 (%)   Monocytes Absolute 1.0  0.1 - 1.0 (K/uL)   Eosinophils Relative 6 (*) 0 - 5 (%)   Eosinophils Absolute 0.7  0.0 - 0.7 (K/uL)   Basophils Relative 0  0 - 1 (%)   Basophils Absolute 0.0  0.0 - 0.1 (K/uL)  COMPREHENSIVE METABOLIC PANEL      Component Value Range   Sodium 136  135 - 145 (mEq/L)   Potassium 3.4 (*) 3.5 - 5.1 (mEq/L)   Chloride 99  96 - 112 (mEq/L)   CO2 28  19 - 32 (mEq/L)   Glucose, Bld 102 (*) 70 - 99 (mg/dL)   BUN 7  6 - 23 (mg/dL)   Creatinine, Ser 0.10  0.50 - 1.10 (mg/dL)   Calcium 9.8  8.4 - 27.2 (mg/dL)   Total Protein 7.4  6.0 - 8.3 (g/dL)   Albumin 3.0 (*) 3.5 - 5.2 (g/dL)   AST 536 (*) 0 - 37 (U/L)   ALT 344 (*) 0 - 35 (U/L)   Alkaline Phosphatase 164 (*) 39 - 117 (U/L)   Total Bilirubin 2.4 (*) 0.3 - 1.2 (mg/dL)   GFR calc non Af Amer 90 (*) >90 (mL/min)   GFR calc Af Amer >90  >90 (mL/min)  LIPASE, BLOOD      Component Value Range   Lipase 23  11 - 59 (U/L)  URINALYSIS, ROUTINE W REFLEX MICROSCOPIC      Component Value Range   Color, Urine ORANGE (*) YELLOW    APPearance CLEAR  CLEAR    Specific Gravity, Urine 1.029  1.005 - 1.030    pH 7.0  5.0 - 8.0    Glucose, UA NEGATIVE  NEGATIVE (mg/dL)   Hgb urine dipstick NEGATIVE  NEGATIVE    Bilirubin Urine MODERATE (*) NEGATIVE    Ketones, ur 15 (*) NEGATIVE (mg/dL)   Protein, ur 30 (*) NEGATIVE (mg/dL)   Urobilinogen, UA 1.0  0.0 - 1.0 (mg/dL)   Nitrite POSITIVE (*) NEGATIVE    Leukocytes, UA SMALL (*) NEGATIVE   POCT PREGNANCY, URINE      Component Value Range   Preg Test, Ur NEGATIVE    URINE MICROSCOPIC-ADD ON      Component Value Range   Squamous Epithelial / LPF FEW (*) RARE    WBC, UA 3-6  <3 (WBC/hpf)   RBC / HPF 0-2  <3 (RBC/hpf)   Bacteria, UA FEW (*) RARE    Casts HYALINE CASTS (*) NEGATIVE    Urine-Other MUCOUS PRESENT      Results for  Breanna, [name] (MRN [id number]) as of 07/06/2011 05:16  Ref. Range 07/01/2011 09:15 07/02/2011 06:25 07/03/2011 07:00 07/04/2011 06:00 07/06/2011 03:02  AST Latest Range: 0-37 U/L 329 (H) 293 (H) 185 (H) 187 (H) 221 (H)  ALT Latest Range: 0-35 U/L 375 (H) 372 (H) 379 (H) 367 (H) 344 (H)  Total Bilirubin Latest Range: 0.3-1.2 mg/dL 1.5 (H) 1.7 (H) 0.7 1.9 (H) 2.4 (H)     Ref. Range 07/02/2011 06:25 07/02/2011 16:31 07/03/2011 07:00 07/04/2011 06:00 07/06/2011 03:02  WBC Latest Range: 4.0-10.5 K/uL 14.1 (H) 13.9 (H) 16.8 (H) 11.0 (H) 11.7 (H)  HGB Latest Range: 12.0-15.0 g/dL 8.1 (L) 9.2 (L) 8.8 (L) 8.5 (L) 9.1 (L)  HCT Latest Range: 36.0-46.0 % 28.8 (L) 31.9 (L) 30.6 (L) 31.4 (L) 31.8 (L)  Platelets Latest Range: 150-400 K/uL 529 (H) 595 (H) 587 (H) 582 (H) 550 (H)    Dg Abd Acute W/chest 07/06/2011  *RADIOLOGY REPORT*  Clinical Data: Abdominal pain, shortness of breath and vomiting. Assess for free intra-abdominal air.  ACUTE ABDOMEN SERIES (ABDOMEN 2 VIEW & CHEST 1 VIEW)  Comparison: Chest and abdominal radiographs performed 02/08/2010, and chest radiograph performed 06/26/2011; MRCP performed 07/04/2011  Findings: The lungs are well-aerated.  Mild bibasilar linear opacities likely reflect atelectasis.  There is no evidence of pleural effusion or pneumothorax.  The cardiomediastinal silhouette is borderline normal in size.  The visualized bowel gas pattern is unremarkable.  Stool and air are noted throughout the colon; there is no evidence  of small bowel dilatation to suggest obstruction.  No free intra-abdominal air is identified on the provided upright view.  No acute osseous abnormalities are seen; the sacroiliac joints are unremarkable in appearance.  Clips are noted within the right upper quadrant, reflecting prior cholecystectomy.  IMPRESSION:  1.  Unremarkable bowel gas pattern; no free intra-abdominal air seen. 2.  Mild bibasilar linear airspace opacities likely reflect atelectasis.  Original  Report Authenticated By: Tonia Ghent, M.D.     Johnnie.Overly:  Pt currently sleeping soundly, no N/V after meds.  Dx testing d/w pt and family.  Questions answered.  Verb understanding.  States "if she gets like this they said you have to send her to [facility]  Tbili mildly elevated from her discharge, as well as AST and WBC, but afebrile with stable VS.  T/C to Surgery Dr. Lindie Spruce, case discussed, including:  HPI, pertinent PM/SHx, VS/PE, dx testing, ED course and treatment; states at this time pt does not appear to need transfer to [facility], but will need PO fluid trial, he will have next shift come to ED to eval pt, perhaps they may be able to facilitate moving pt's appt up sooner than Friday (if pt stays here at [facility] she will need open procedure to remove CBD stone and family/pt does not want that/wants ERCP to his knowledge).  Family updated.     Jaycee Pelzer Allison Quarry, DO 07/08/11 1926

## 2011-07-06 NOTE — Consult Note (Signed)
Agree Rheana Casebolt E  

## 2011-07-06 NOTE — ED Notes (Signed)
Pt states she is does not need to void, urine cup placed on bedside table and pt made aware that urine sample is needed

## 2011-07-06 NOTE — ED Notes (Signed)
Pt provided with sprite to see how she tolerates po fluids.

## 2011-07-06 NOTE — ED Provider Notes (Signed)
  Physical Exam  BP 136/91  Pulse 85  Temp(Src) 98.7 F (37.1 C) (Oral)  Resp 20  SpO2 96%  LMP 06/19/2011  Physical Exam  ED Course  Procedures  MDM Pt has been seen by general surgery.  Pt is comfortable with going home.  Would like a prescription for anti nausea medications.  Pt would like to try to make it through christmas and then will see [facility] as planned.      Celene Kras, MD 07/06/11 [phone]

## 2011-07-08 ENCOUNTER — Encounter (HOSPITAL_COMMUNITY): Payer: Self-pay | Admitting: Gastroenterology

## 2011-07-09 ENCOUNTER — Encounter (HOSPITAL_COMMUNITY): Payer: Self-pay | Admitting: Gastroenterology

## 2011-07-21 ENCOUNTER — Telehealth: Payer: Self-pay | Admitting: Internal Medicine

## 2011-07-21 NOTE — Telephone Encounter (Signed)
I spoke with Lyla Son and she states pt's albuterol nebulizer quantity was incorrect on RX and needs this faxed to [facility] attn: Wonda Cerise. Will send back to triage to print off and have DOC of the day sign in the AM

## 2011-07-22 NOTE — Telephone Encounter (Signed)
LMOM TCB x1 for [place].  What is the correct quantity?  Pt never seen in office > canceled HFU with MW for 12.24.12 and rsc to 1.23.13.

## 2011-07-22 NOTE — Telephone Encounter (Signed)
Pt is scheduled to see MW 08/05/11 for HFU and was d/c'd from hospital with a prescription that can not be filled. Lyla Son with [facility] states they need an actual script faxed over for pt's neb medications. Looking in pt's d/c notes, was to be given Albuterol 2.5mg  tid. Lyla Son states she was given an order for Atrovent 0.5/albuterol 2.5 3mL tid. Dr. Sherene Sires please advise if you will provide a prescription for this pt and what neb do you want. Caller is aware MW will return to office 07/23/11. Thanks  (If any additional questions please contact Wonda Cerise at [phone]. Fax [phone].)

## 2011-07-23 MED ORDER — IPRATROPIUM-ALBUTEROL 0.5-2.5 (3) MG/3ML IN SOLN: RESPIRATORY_TRACT | Status: DC

## 2011-07-23 NOTE — Telephone Encounter (Signed)
unitl ov ok to use the duoneb up to 4 x daily but needs to return to this office for the appt with all meds including neb solutions in hand and see [name] or me sooner worsening in any way

## 2011-07-23 NOTE — Telephone Encounter (Signed)
Called and spoke with Lyla Son from [facility] and informed her of MW's recs.  She stated she would need a signed written rx faxed to [facility] # provided below.  Therefore rx printed and faxed to [facility].

## 2011-08-05 ENCOUNTER — Ambulatory Visit (INDEPENDENT_AMBULATORY_CARE_PROVIDER_SITE_OTHER): Payer: Medicare Other | Admitting: Internal Medicine

## 2011-08-05 ENCOUNTER — Encounter: Payer: Self-pay | Admitting: Internal Medicine

## 2011-08-05 DIAGNOSIS — J45909 Unspecified asthma, uncomplicated: Secondary | ICD-10-CM

## 2011-08-05 DIAGNOSIS — K819 Cholecystitis, unspecified: Secondary | ICD-10-CM

## 2011-08-05 MED ORDER — TRAMADOL HCL 50 MG PO TABS: ORAL_TABLET | ORAL | Status: AC

## 2011-08-05 MED ORDER — METHYLPREDNISOLONE ACETATE 80 MG/ML IJ SUSP
80.0000 mg | Freq: Once | INTRAMUSCULAR | Status: AC
  Administered 2011-08-05: 80 mg via INTRAMUSCULAR

## 2011-08-05 NOTE — Progress Notes (Signed)
Subjective:     Patient ID: [name], female   DOB: 1975/08/11, 36 y.o.   MRN: [id number]  HPI  33 yobf never smoker with asthma all her life last well p steroid injection around 12/2010 and downhill since then referred to pulmonary clinic by [facility]  08/05/2011 1st pulmonary ov for lifelong asthma under care of Dr Jethro Bolus with only good breathing p depomedrol injection on allergy shots didn't help last tried them around 2009 and not effective with use of HFA but using up to 8 x in 24 hours.  Sob with anything more than slow adls since 12/2010 but some better p saba.  Intermittent severe mostly dry cough, no purulent sputum, some assoc nasal congestion. Symptoms day and night to point of interfering with sleep nightly.   Denies any obvious fluctuation of symptoms with weather or environmental changes or other aggravating or alleviating factors except as outlined above   ROS  At present neg for  any significant sore throat, dysphagia, itching, sneezing,    excess/ purulent nasal  secretions,  fever, chills, sweats, unintended wt loss, pleuritic or exertional cp, hempoptysis, orthopnea pnd or leg swelling.  Also denies presyncope, palpitations, heartburn, abdominal pain, nausea, vomiting, diarrhea  or change in bowel or urinary habits, dysuria,hematuria,  rash, arthralgias, visual complaints, headache, numbness weakness or ataxia.     Review of Systems     Objective:   Physical Exam  Wt 251 08/05/11 amb obese bf nad  HEENT: nl dentition,  and orophanx. Nl external ear canals without cough reflex. Moderate turbinate edema with non-specific features   NECK :  without JVD/Nodes/TM/ nl carotid upstrokes bilaterally   LUNGS: no acc muscle use, clear to A and P bilaterally without cough on insp or exp maneuvers   CV:  RRR  no s3 or murmur or increase in P2, no edema   ABD:  soft and nontender with nl excursion in the supine position. No bruits or organomegaly, bowel sounds  nl  MS:  warm without deformities, calf tenderness, cyanosis or clubbing  SKIN: warm and dry without lesions    NEURO:  alert, approp, no deficits    cxr 07/06/11 Mild bibasilar linear airspace opacities likely reflect  atelectasis.     Assessment:         Plan:

## 2011-08-05 NOTE — Patient Instructions (Signed)
Plan A Symbicort 160 Take 2 puffs first thing in am and then another 2 puffs about 12 hours later.  Prilosec 20 mg Take 30-60 min before first meal of the day  Singulair 10 mg one in evening Zantac 75 mg 2 at bedtime   Plan B  If short of breath >  Ventolin up to every 4 hours If cough > delsym (otc)    Plan C  If really short of breath and ventolin not working use the nebulizer If really can't stop coughing add tramadol to the delsym   See [name] w/in 2 weeks with all your medications, even over the counter meds, separated in two separate bags, the ones you take no matter what vs the ones you stop once you feel better and take only as needed when you feel you need them.   [name]  will generate for you a new user friendly medication calendar that will put [place] all on the same page re: your medication use.     Without this process, it simply isn't possible to assure that we are providing  your outpatient care  with  the attention to detail we feel you deserve.   If we cannot assure that you're getting that kind of care,  then we cannot manage your problem effectively from this clinic.  Once you have seen [name] and we are sure that we're all on the same page with your medication use she will arrange follow up with me.

## 2011-08-15 DIAGNOSIS — J45909 Unspecified asthma, uncomplicated: Secondary | ICD-10-CM | POA: Insufficient documentation

## 2011-08-15 NOTE — Assessment & Plan Note (Addendum)
Symptoms are markedly disproportionate to objective findings and not clear this is all a lung problem but pt does appear to have difficult airway management issues.  DDX of  difficult airways managment all start with A and  include Adherence, Ace Inhibitors, Acid Reflux, Active Sinus Disease, Alpha 1 Antitripsin deficiency, Anxiety masquerading as Airways dz,  ABPA,  allergy(esp in young), Aspiration (esp in elderly), Adverse effects of DPI,  Active smokers, plus two Bs  = Bronchiectasis and Beta blocker use..and one C= CHF   In this case Adherence is the biggest issue and starts with  inability to use HFA effectively and also  understand that SABA treats the symptoms but doesn't get to the underlying problem (inflammation).  I used  the analogy of putting steroid cream on a rash to help explain the meaning of topical therapy and the need to get the drug to the target tissue.  The proper method of use, as well as anticipated side effects, of this metered-dose inhaler are discussed and demonstrated to the patient. Improved to only 50% despite reported lifelong asthma under the care of an allergy/asthma specialist.  ? Acid reflux playing a role > diet / gerd rx reviewed    Each maintenance medication was reviewed in detail including most importantly the difference between maintenance and as needed and under what circumstances the prns are to be used.  Please see instructions for details which were reviewed in writing and the patient given a copy.

## 2011-08-19 ENCOUNTER — Encounter: Payer: Medicare Other | Admitting: Adult Health

## 2011-09-01 ENCOUNTER — Encounter: Payer: Medicare Other | Admitting: Adult Health

## 2011-11-15 ENCOUNTER — Emergency Department (HOSPITAL_COMMUNITY)
Admission: EM | Admit: 2011-11-15 | Discharge: 2011-11-15 | Disposition: A | Discharge status: Home / Self Care | Payer: Medicare Other | Attending: Emergency Medicine | Admitting: Emergency Medicine

## 2011-11-15 ENCOUNTER — Emergency Department (HOSPITAL_COMMUNITY): Payer: Medicare Other

## 2011-11-15 ENCOUNTER — Encounter (HOSPITAL_COMMUNITY): Payer: Self-pay

## 2011-11-15 DIAGNOSIS — S335XXA Sprain of ligaments of lumbar spine, initial encounter: Secondary | ICD-10-CM | POA: Insufficient documentation

## 2011-11-15 DIAGNOSIS — S39012A Strain of muscle, fascia and tendon of lower back, initial encounter: Secondary | ICD-10-CM

## 2011-11-15 DIAGNOSIS — Y9289 Other specified places as the place of occurrence of the external cause: Secondary | ICD-10-CM | POA: Insufficient documentation

## 2011-11-15 DIAGNOSIS — K219 Gastro-esophageal reflux disease without esophagitis: Secondary | ICD-10-CM | POA: Insufficient documentation

## 2011-11-15 DIAGNOSIS — J45909 Unspecified asthma, uncomplicated: Secondary | ICD-10-CM | POA: Insufficient documentation

## 2011-11-15 DIAGNOSIS — M545 Low back pain, unspecified: Secondary | ICD-10-CM | POA: Insufficient documentation

## 2011-11-15 MED ORDER — HYDROCODONE-ACETAMINOPHEN 5-325 MG PO TABS: 1.0000 | ORAL_TABLET | Freq: Four times a day (QID) | ORAL | Status: AC | PRN

## 2011-11-15 MED ORDER — IBUPROFEN 800 MG PO TABS
800.0000 mg | ORAL_TABLET | Freq: Once | ORAL | Status: AC
  Administered 2011-11-15: 800 mg via ORAL
  Filled 2011-11-15: qty 1

## 2011-11-15 MED ORDER — IBUPROFEN 800 MG PO TABS: 800.0000 mg | ORAL_TABLET | Freq: Three times a day (TID) | ORAL | Status: AC | PRN
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
## 2011-11-15 NOTE — Discharge Instructions (Signed)
The x-rays here were normal. Use ice and heat on your back.
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
## 2011-11-17 NOTE — ED Provider Notes (Signed)
Medical screening examination/treatment/procedure(s) were performed by non-physician practitioner and as supervising physician I was immediately available for consultation/collaboration.  Toy Baker, MD 11/17/11 [phone]

## 2012-04-11 ENCOUNTER — Encounter (HOSPITAL_COMMUNITY): Payer: Self-pay | Admitting: Emergency Medicine

## 2012-04-11 ENCOUNTER — Emergency Department (HOSPITAL_COMMUNITY)
Admission: EM | Admit: 2012-04-11 | Discharge: 2012-04-11 | Disposition: A | Discharge status: Home / Self Care | Payer: Medicare Other | Attending: Emergency Medicine | Admitting: Emergency Medicine

## 2012-04-11 DIAGNOSIS — G5790 Unspecified mononeuropathy of unspecified lower limb: Secondary | ICD-10-CM | POA: Insufficient documentation

## 2012-04-11 DIAGNOSIS — K219 Gastro-esophageal reflux disease without esophagitis: Secondary | ICD-10-CM | POA: Insufficient documentation

## 2012-04-11 DIAGNOSIS — Z9109 Other allergy status, other than to drugs and biological substances: Secondary | ICD-10-CM | POA: Insufficient documentation

## 2012-04-11 DIAGNOSIS — M792 Neuralgia and neuritis, unspecified: Secondary | ICD-10-CM

## 2012-04-11 DIAGNOSIS — M79673 Pain in unspecified foot: Secondary | ICD-10-CM

## 2012-04-11 DIAGNOSIS — J45909 Unspecified asthma, uncomplicated: Secondary | ICD-10-CM | POA: Insufficient documentation

## 2012-04-11 DIAGNOSIS — Z91038 Other insect allergy status: Secondary | ICD-10-CM | POA: Insufficient documentation

## 2012-04-11 MED ORDER — TRAMADOL HCL 50 MG PO TABS
50.0000 mg | ORAL_TABLET | Freq: Once | ORAL | Status: AC
  Administered 2012-04-11: 50 mg via ORAL
  Filled 2012-04-11: qty 1

## 2012-04-11 MED ORDER — GABAPENTIN 300 MG PO CAPS
300.0000 mg | ORAL_CAPSULE | Freq: Once | ORAL | Status: AC
  Administered 2012-04-11: 300 mg via ORAL
  Filled 2012-04-11: qty 1

## 2012-04-11 MED ORDER — GABAPENTIN 600 MG PO TABS
300.0000 mg | ORAL_TABLET | Freq: Once | ORAL | Status: DC
Start: 2012-04-11 — End: 2012-04-11

## 2012-04-11 MED ORDER — GABAPENTIN 300 MG PO CAPS: 300.0000 mg | ORAL_CAPSULE | Freq: Three times a day (TID) | ORAL | Status: DC

## 2012-04-11 MED ORDER — TRAMADOL HCL 50 MG PO TABS: 50.0000 mg | ORAL_TABLET | Freq: Four times a day (QID) | ORAL | Status: DC | PRN

## 2012-04-11 NOTE — ED Notes (Signed)
Pt states she is having numbness and tingling in her left foot to her toes and underneath the toes  Pt states it started last night and has continued throughout the day  Pt states they keep getting cold as well  Pt states it is worse in her great toe  Denies injury

## 2012-04-11 NOTE — ED Provider Notes (Signed)
History     CSN: [id number]  Arrival date & time 04/11/12  1308   First MD Initiated Contact with Patient 04/11/12 2030      Chief Complaint  Patient presents with  . Numbness    (Consider location/radiation/quality/duration/timing/severity/associated sxs/prior treatment) HPI Comments: [address] 36 y.o. female   The chief complaint is: Patient presents with:   Numbness   The patient has medical history significant for:   Past Medical History:   Asthma                                                       Environmental allergies                                      Fibroids                                                     Shortness of breath                                          GERD (gastroesophageal reflux disease)                       Anemia                                                      Patient presents with left foot pain, numbness, and tingling since yesterday. Patient does not recall injuring the foot, but states that she had a painful cramp in the foot, and the other symptoms came later. Denies fever or chills. Denies NVD or abdominal pain. Denies history of trauma or prior surgeries. Denies lower extremity edema. Denies difficulty ambulating.      The history is provided by the patient. No language interpreter was used.    Past Medical History  Diagnosis Date  . Asthma   . Environmental allergies   . Fibroids   . Shortness of breath   . GERD (gastroesophageal reflux disease)   . Anemia     Past Surgical History  Procedure Date  . Ectopic pregnancy surgery   . Uterine fibroid surgery   . Cholecystectomy     06/29/2011  . Cholecystectomy 06/27/2011    Procedure: LAPAROSCOPIC CHOLECYSTECTOMY WITH INTRAOPERATIVE CHOLANGIOGRAM;  Surgeon: Atilano Ina, MD;  Location: [facility] OR;  Service: General;  Laterality: N/A;  . Cholecystectomy 06/29/2011    Procedure: LAPAROSCOPIC CHOLECYSTECTOMY WITH INTRAOPERATIVE CHOLANGIOGRAM;  Surgeon: Robyne Askew, MD;  Location: [facility] OR;  Service: General;  Laterality: N/A;  . Ercp 06/30/2011    Procedure: ENDOSCOPIC RETROGRADE CHOLANGIOPANCREATOGRAPHY (ERCP);  Surgeon: Petra Kuba, MD;  Location: [facility] ENDOSCOPY;  Service: Endoscopy;  Laterality: N/A;  probable sphincterotomy  . Ercp 07/03/2011    Procedure: ENDOSCOPIC RETROGRADE CHOLANGIOPANCREATOGRAPHY (ERCP);  Surgeon: Freddy Jaksch, MD;  Location: [facility] OR;  Service: Endoscopy;  Laterality: N/A;  PROPOFOL    Family History  Problem Relation Age of Onset  . Anesthesia problems Neg Hx   . Hypotension Neg Hx   . Malignant hyperthermia Neg Hx   . Pseudochol deficiency Neg Hx   . Hypertension Mother   . Diabetes Mother     History  Substance Use Topics  . Smoking status: Never Smoker   . Smokeless tobacco: Never Used  . Alcohol Use: No    OB History    Grav Para Term Preterm Abortions TAB SAB Ect Mult Living                  Review of Systems  Constitutional: Positive for chills. Negative for fever.  Gastrointestinal: Negative for nausea, vomiting, abdominal pain and diarrhea.  Musculoskeletal: Positive for arthralgias.  All other systems reviewed and are negative.    Allergies  Bee venom and Prednisone  Home Medications   Current Outpatient Rx  Name Route Sig Dispense Refill  . ALBUTEROL SULFATE HFA 108 (90 BASE) MCG/ACT IN AERS Inhalation Inhale 1-2 puffs into the lungs every 6 (six) hours as needed for wheezing. 1 Inhaler 0  . ALBUTEROL SULFATE (5 MG/ML) 0.5% IN NEBU Nebulization Take 0.5 mLs (2.5 mg total) by nebulization 3 (three) times daily. 20 mL 4  . BUDESONIDE-FORMOTEROL FUMARATE 160-4.5 MCG/ACT IN AERO Inhalation Inhale 2 puffs into the lungs 2 (two) times daily.      Marland Kitchen FLUTICASONE PROPIONATE 50 MCG/ACT NA SUSP Nasal Place 2 sprays into the nose daily.    . IPRATROPIUM-ALBUTEROL 0.5-2.5 (3) MG/3ML IN SOLN  1 vial in nebulizer up to 4 times a day. 360 mL 0  . MONTELUKAST SODIUM 10 MG PO TABS Oral Take 10 mg by  mouth at bedtime.      . OMEPRAZOLE 20 MG PO CPDR Oral Take 1 capsule (20 mg total) by mouth daily. 30 capsule 1    BP 136/69  Pulse 92  Temp 98.4 F (36.9 C) (Oral)  Resp 16  SpO2 99%  LMP 04/06/2012  Physical Exam  Nursing note and vitals reviewed. Constitutional: She appears well-developed and well-nourished. No distress.  HENT:  Head: Normocephalic and atraumatic.  Mouth/Throat: Oropharynx is clear and moist.  Eyes: Conjunctivae normal and EOM are normal. No scleral icterus.  Neck: Normal range of motion. Neck supple.  Cardiovascular: Normal rate, regular rhythm and intact distal pulses.        Pedal pulses strong.   Pulmonary/Chest: Effort normal and breath sounds normal.  Abdominal: Soft. Bowel sounds are normal.  Musculoskeletal: Normal range of motion. She exhibits tenderness. She exhibits no edema.       Tenderness to palpation of the toes.   Neurological: She is alert.       Patient reports decreased sensation which light touch of the left foot when compared to the right.  Skin: Skin is warm and dry.    ED Course  Procedures (including critical care time)  Labs Reviewed  GLUCOSE, CAPILLARY - Abnormal; Notable for the following:    Glucose-Capillary 107 (*)     All other components within normal limits   No results found.   1. Foot pain   2. Neuropathic pain       MDM  Patient presented with left foot pain, numbness, and tingling, since yesterday. Patient given tramadol and gabapentin with improvement. Patient concerned that these symptoms may be the result  of diabetes, as it runs in her family. Patient glucose does not seem impaired, but she was instructed follow-up with her PCP for further evaluation and follow-up. Patient discharged with Rx for gabapentin. Return precautions given verbally and in discharge summary.         Pixie Casino, PA-C 04/12/12 0122

## 2012-04-11 NOTE — ED Notes (Signed)
Pt with numbness and tingling in her left foot.  No other symptoms reported.  Pt is concerned that she might have diabetes.  Family history.

## 2012-04-13 NOTE — ED Provider Notes (Signed)
Medical screening examination/treatment/procedure(s) were performed by non-physician practitioner and as supervising physician I was immediately available for consultation/collaboration.  Jones Skene, M.D.     Jones Skene, MD 04/13/12 1308

## 2012-06-22 ENCOUNTER — Other Ambulatory Visit (INDEPENDENT_AMBULATORY_CARE_PROVIDER_SITE_OTHER): Payer: Self-pay | Admitting: Otolaryngology

## 2012-06-22 DIAGNOSIS — R0981 Nasal congestion: Secondary | ICD-10-CM

## 2012-06-22 DIAGNOSIS — J339 Nasal polyp, unspecified: Secondary | ICD-10-CM

## 2012-06-27 ENCOUNTER — Ambulatory Visit
Admission: RE | Admit: 2012-06-27 | Discharge: 2012-06-27 | Disposition: A | Discharge status: Home / Self Care | Payer: Medicare Other | Source: Ambulatory Visit | Attending: Otolaryngology | Admitting: Otolaryngology

## 2012-06-27 DIAGNOSIS — R0981 Nasal congestion: Secondary | ICD-10-CM

## 2012-06-27 DIAGNOSIS — J339 Nasal polyp, unspecified: Secondary | ICD-10-CM

## 2012-07-13 HISTORY — PX: MYOMECTOMY: SHX85

## 2012-07-25 ENCOUNTER — Inpatient Hospital Stay (HOSPITAL_COMMUNITY)
Admission: AD | Admit: 2012-07-25 | Discharge: 2012-07-25 | Disposition: A | Discharge status: Home / Self Care | Payer: Medicare Other | Source: Ambulatory Visit | Attending: Obstetrics & Gynecology | Admitting: Obstetrics & Gynecology

## 2012-07-25 ENCOUNTER — Inpatient Hospital Stay (HOSPITAL_COMMUNITY): Payer: Medicare Other

## 2012-07-25 ENCOUNTER — Encounter (HOSPITAL_COMMUNITY): Payer: Self-pay

## 2012-07-25 DIAGNOSIS — D259 Leiomyoma of uterus, unspecified: Secondary | ICD-10-CM | POA: Insufficient documentation

## 2012-07-25 DIAGNOSIS — R109 Unspecified abdominal pain: Secondary | ICD-10-CM | POA: Insufficient documentation

## 2012-07-25 DIAGNOSIS — D649 Anemia, unspecified: Secondary | ICD-10-CM | POA: Insufficient documentation

## 2012-07-25 DIAGNOSIS — D219 Benign neoplasm of connective and other soft tissue, unspecified: Secondary | ICD-10-CM

## 2012-07-25 HISTORY — DX: Leiomyoma of uterus, unspecified: D25.9

## 2012-07-25 LAB — URINALYSIS, ROUTINE W REFLEX MICROSCOPIC
Glucose, UA: NEGATIVE mg/dL
Leukocytes, UA: NEGATIVE
Specific Gravity, Urine: >1.03 — ABNORMAL HIGH (ref 1.005–1.030)
pH: 6 (ref 5.0–8.0)

## 2012-07-25 LAB — CBC
Hemoglobin: 7 g/dL — ABNORMAL LOW (ref 12.0–15.0)
RBC: 4.69 MIL/uL (ref 3.87–5.11)

## 2012-07-25 LAB — POCT PREGNANCY, URINE: Preg Test, Ur: NEGATIVE

## 2012-07-25 MED ORDER — KETOROLAC TROMETHAMINE 60 MG/2ML IM SOLN: 60.0000 mg | Freq: Once | INTRAMUSCULAR | Status: DC

## 2012-07-25 MED ORDER — KETOROLAC TROMETHAMINE 60 MG/2ML IM SOLN
INTRAMUSCULAR | Status: AC
  Administered 2012-07-25: 60 mg
  Filled 2012-07-25: qty 2

## 2012-07-25 MED ORDER — IBUPROFEN 600 MG PO TABS: 600.0000 mg | ORAL_TABLET | Freq: Four times a day (QID) | ORAL | Status: DC | PRN

## 2012-07-25 MED ORDER — OXYCODONE-ACETAMINOPHEN 5-325 MG PO TABS: 1.0000 | ORAL_TABLET | Freq: Four times a day (QID) | ORAL | Status: DC | PRN

## 2012-07-25 MED ORDER — PROMETHAZINE HCL 25 MG/ML IJ SOLN: 25.0000 mg | Freq: Once | INTRAMUSCULAR | Status: DC

## 2012-07-25 MED ORDER — FERROUS SULFATE 325 (65 FE) MG PO TABS: 325.0000 mg | ORAL_TABLET | Freq: Three times a day (TID) | ORAL | Status: DC

## 2012-07-25 NOTE — MAU Provider Note (Signed)
History     CSN: [id number]  Arrival date and time: 07/25/12 1643   First Provider Initiated Contact with Patient 07/25/12 1735      Chief Complaint  Patient presents with  . Abdominal Pain   HPI [name] is a 37 y.o. G1P0010 who presents to MAU today for evaluation of lower abdominal pain. The patient states that she was previously diagnosed with fibroids and had a procedure (? Ablation) in 2000 or 2001. The patient also had surgery for an ectopic pregnancy in 2011 and was told that the fibroids were still present. The patient has been having this severe pain for 3 days. It is worse with ambulation and she feels very bloated. She rates her pain at 10/10 right now. The patient took 400 mg ibuprofen yesterday and nothing today. She denies vaginal bleeding today. The patient states that she has regular periods which have gotten progressively heavier over the past few months. Her LMP is 07/08/12. Her periods generally last 5-7 days. The patient denies N/V and has regular BMs. She states that she has a headache today, but denies feeling faint, dizzy or weak right now.   OB History    Grav Para Term Preterm Abortions TAB SAB Ect Mult Living   1    1   1         Past Medical History  Diagnosis Date  . Asthma   . Environmental allergies   . Fibroids   . Shortness of breath   . GERD (gastroesophageal reflux disease)   . Anemia   . Fibroid, uterine     Past Surgical History  Procedure Date  . Ectopic pregnancy surgery   . Uterine fibroid surgery   . Cholecystectomy     06/29/2011  . Cholecystectomy 06/27/2011    Procedure: LAPAROSCOPIC CHOLECYSTECTOMY WITH INTRAOPERATIVE CHOLANGIOGRAM;  Surgeon: Atilano Ina, MD;  Location: [facility] OR;  Service: General;  Laterality: N/A;  . Cholecystectomy 06/29/2011    Procedure: LAPAROSCOPIC CHOLECYSTECTOMY WITH INTRAOPERATIVE CHOLANGIOGRAM;  Surgeon: Robyne Askew, MD;  Location: [facility] OR;  Service: General;  Laterality: N/A;  . Ercp 06/30/2011     Procedure: ENDOSCOPIC RETROGRADE CHOLANGIOPANCREATOGRAPHY (ERCP);  Surgeon: Petra Kuba, MD;  Location: [facility] ENDOSCOPY;  Service: Endoscopy;  Laterality: N/A;  probable sphincterotomy  . Ercp 07/03/2011    Procedure: ENDOSCOPIC RETROGRADE CHOLANGIOPANCREATOGRAPHY (ERCP);  Surgeon: Freddy Jaksch, MD;  Location: [facility] OR;  Service: Endoscopy;  Laterality: N/A;  PROPOFOL    Family History  Problem Relation Age of Onset  . Anesthesia problems Neg Hx   . Hypotension Neg Hx   . Malignant hyperthermia Neg Hx   . Pseudochol deficiency Neg Hx   . Hypertension Mother   . Diabetes Mother     History  Substance Use Topics  . Smoking status: Never Smoker   . Smokeless tobacco: Never Used  . Alcohol Use: No    Allergies:  Allergies  Allergen Reactions  . Bee Venom Anaphylaxis    Patient has an epi pen, also has not been stung before but was informed she is allergic  . Prednisone Hives    No prescriptions prior to admission    ROS All negative unless otherwise noted in HPI Physical Exam   Blood pressure 136/85, pulse 110, temperature 97.9 F (36.6 C), temperature source Oral, resp. rate 20, height 5\' 2"  (1.575 m), weight 270 lb (122.471 kg), last menstrual period 07/07/2012, SpO2 99.00%.  Physical Exam  Constitutional: She is oriented to person,  place, and time.       Well-developed, obese female in no acute distress  HENT:  Head: Normocephalic and atraumatic.  Cardiovascular: Normal rate, regular rhythm and normal heart sounds.   Respiratory: Effort normal. No respiratory distress. She has wheezes (expiratory wheezes).  GI: Soft. Bowel sounds are normal. She exhibits no distension and no mass (difficult to assess secondary to body habitus). There is tenderness (moderately tender to palpation of the lower abdomen and some mild diffuse tenderness otherwise). There is no rebound and no guarding.  Neurological: She is alert and oriented to person, place, and time.  Skin: Skin is  warm and dry. No erythema.  Psychiatric: She has a normal mood and affect.   Results for orders placed during the hospital encounter of 07/25/12 (from the past 24 hour(s))  URINALYSIS, ROUTINE W REFLEX MICROSCOPIC     Status: Abnormal   Collection Time   07/25/12  5:08 PM      Component Value Range   Color, Urine YELLOW  YELLOW   APPearance HAZY (*) CLEAR   Specific Gravity, Urine >1.030 (*) 1.005 - 1.030   pH 6.0  5.0 - 8.0   Glucose, UA NEGATIVE  NEGATIVE mg/dL   Hgb urine dipstick NEGATIVE  NEGATIVE   Bilirubin Urine NEGATIVE  NEGATIVE   Ketones, ur NEGATIVE  NEGATIVE mg/dL   Protein, ur NEGATIVE  NEGATIVE mg/dL   Urobilinogen, UA 0.2  0.0 - 1.0 mg/dL   Nitrite NEGATIVE  NEGATIVE   Leukocytes, UA NEGATIVE  NEGATIVE  POCT PREGNANCY, URINE     Status: Normal   Collection Time   07/25/12  5:18 PM      Component Value Range   Preg Test, Ur NEGATIVE  NEGATIVE  CBC     Status: Abnormal   Collection Time   07/25/12  6:31 PM      Component Value Range   WBC 12.4 (*) 4.0 - 10.5 K/uL   RBC 4.69  3.87 - 5.11 MIL/uL   Hemoglobin 7.0 (*) 12.0 - 15.0 g/dL   HCT 45.4 (*) 09.8 - 11.9 %   MCV 55.9 (*) 78.0 - 100.0 fL   MCH 14.9 (*) 26.0 - 34.0 pg   MCHC 26.7 (*) 30.0 - 36.0 g/dL   RDW 14.7 (*) 82.9 - 56.2 %   Platelets 583 (*) 150 - 400 K/uL   *RADIOLOGY REPORT*   Clinical Data: Pain. Previous ectopic. History of fibroids.   TRANSABDOMINAL AND TRANSVAGINAL ULTRASOUND OF PELVIS  Technique: Both transabdominal and transvaginal ultrasound  examinations of the pelvis were performed. Transabdominal technique  was performed for global imaging of the pelvis including uterus,  ovaries, adnexal regions, and pelvic cul-de-sac.  It was necessary to proceed with endovaginal exam following the  transabdominal exam to visualize the endometrium.   Comparison: CT 06/26/2011   Findings:   Uterus: 7.4 x 11.2 x 18.9 cm. Multiple fibroids. Largest  exophytic from the posterior fundus, 10 x 12.3 x  13.3 cm. There is  a 5.9 x 6 x 6.9 cm fibroid in the posterior right uterine body and  a 3.3 x 3.5 x 3.7 cm fibroid on the left above the lower uterine  segment.   Endometrium: 13.2 mm maximum thickness, homogeneous echotexture  without focal lesion.   Right ovary: Not visualized on transabdominal or pelvic scanning.   Left ovary: Not visualized on transabdominal or pelvic scanning.  Other findings: No free fluid   IMPRESSION:  1. Large uterine fibroids.  2. Nonvisualization  of the ovaries. Pelvic MR may be useful if  ovarian pathology is a clinical concern.   Original Report Authenticated By: D. Andria Rhein, MD   MAU Course  Procedures None  Patient left prior to getting CBC results. I told her that I would call in Iron supplements if needed.  Called patient and informed her that her Hgb is 7.0 and she will definitely require ferrous sulfate. Cautioned patient to return here ASAP if she begins to develop fatigue, weakness, dizziness, especially with next period.  Patient voiced understanding.   Assessment and Plan  A: Fibroid uterus Anemia  P: Discharge home Patient will be scheduled to follow-up with a surgeon in GYN clinic at [facility] Rx for Percocet and ibuprofen 600 mg sent to patient's pharmacy Rx for ferrous sulfate sent to patient's pharmacy. Patient informed to pick up Rx.  Patient may return to MAU as needed or should her condition worsen  Freddi Starr, PA-C 07/25/2012, 7:53 PM

## 2012-07-25 NOTE — MAU Provider Note (Signed)
Attestation of Attending Supervision of Advanced Practitioner (PA/CNM/NP): Evaluation and management procedures were performed by the Advanced Practitioner under my supervision and collaboration.  I have reviewed the Advanced Practitioner's note and chart, and I agree with the management and plan.  Elyan Vanwieren, MD, FACOG Attending Obstetrician & Gynecologist Faculty Practice, [facility]  

## 2012-07-25 NOTE — MAU Note (Signed)
Sharp pains in bottom of stomach, started about 3 days ago.  Feels bloated, does that when she is on her period, but this seems like it has just stayed

## 2012-08-10 ENCOUNTER — Encounter: Payer: Medicare Other | Admitting: Obstetrics & Gynecology

## 2012-08-18 ENCOUNTER — Other Ambulatory Visit (HOSPITAL_COMMUNITY)
Admission: RE | Admit: 2012-08-18 | Discharge: 2012-08-18 | Disposition: A | Discharge status: Home / Self Care | Payer: Medicare Other | Source: Ambulatory Visit | Attending: Obstetrics & Gynecology | Admitting: Obstetrics & Gynecology

## 2012-08-18 ENCOUNTER — Encounter: Payer: Self-pay | Admitting: Obstetrics & Gynecology

## 2012-08-18 ENCOUNTER — Ambulatory Visit (INDEPENDENT_AMBULATORY_CARE_PROVIDER_SITE_OTHER): Payer: Medicare Other | Admitting: Obstetrics & Gynecology

## 2012-08-18 VITALS — BP 136/92 | HR 95 | Temp 97.1°F | Ht 62.0 in | Wt 270.3 lb

## 2012-08-18 DIAGNOSIS — Z1151 Encounter for screening for human papillomavirus (HPV): Secondary | ICD-10-CM | POA: Insufficient documentation

## 2012-08-18 DIAGNOSIS — Z01419 Encounter for gynecological examination (general) (routine) without abnormal findings: Secondary | ICD-10-CM

## 2012-08-18 DIAGNOSIS — N920 Excessive and frequent menstruation with regular cycle: Secondary | ICD-10-CM

## 2012-08-18 DIAGNOSIS — N949 Unspecified condition associated with female genital organs and menstrual cycle: Secondary | ICD-10-CM | POA: Insufficient documentation

## 2012-08-18 DIAGNOSIS — Z01812 Encounter for preprocedural laboratory examination: Secondary | ICD-10-CM

## 2012-08-18 DIAGNOSIS — Z124 Encounter for screening for malignant neoplasm of cervix: Secondary | ICD-10-CM | POA: Insufficient documentation

## 2012-08-18 DIAGNOSIS — Z01818 Encounter for other preprocedural examination: Secondary | ICD-10-CM

## 2012-08-18 DIAGNOSIS — D219 Benign neoplasm of connective and other soft tissue, unspecified: Secondary | ICD-10-CM

## 2012-08-18 DIAGNOSIS — N938 Other specified abnormal uterine and vaginal bleeding: Secondary | ICD-10-CM | POA: Insufficient documentation

## 2012-08-18 DIAGNOSIS — D259 Leiomyoma of uterus, unspecified: Secondary | ICD-10-CM | POA: Insufficient documentation

## 2012-08-18 MED ORDER — MEDROXYPROGESTERONE ACETATE 10 MG PO TABS: 20.0000 mg | ORAL_TABLET | Freq: Every day | ORAL | Status: DC

## 2012-08-18 NOTE — Patient Instructions (Addendum)
Uterine Fibroid A uterine fibroid is a growth (tumor) that occurs in a woman's uterus. This type of tumor is not cancerous and does not spread out of the uterus. A woman can have one or many fibroids, and the fiboid(s) can become quite large. A fibroid can vary in size, weight, and where it grows in the uterus. Most fibroids do not require medical treatment, but some can cause pain or heavy bleeding during and between periods. CAUSES  A fibroid is the result of a single uterine cell that keeps growing (unregulated), which is different than most cells in the human body. Most cells have a control mechanism that keeps them from reproducing without control.  SYMPTOMS   Bleeding.  Pelvic pain and pressure.  Bladder problems due to the size of the fibroid.  Infertility and miscarriages depending on the size and location of the fibroid. DIAGNOSIS  A diagnosis is made by physical exam. Your caregiver may feel the lumpy tumors during a pelvic exam. Important information regarding size, location, and number of tumors can be gained by having an ultrasound. It is rare that other tests, such as a CT scan or MRI, are needed. TREATMENT   Your caregiver may recommend watchful waiting. This involves getting the fibroid checked by your caregiver to see if the fibroids grow or shrink.   Hormonal treatment or an intrauterine device (IUD) may be prescribed.   Surgery may be needed to remove the fibroids (myomectomy) or the uterus (hysterectomy). This depends on your situation. When fibroids interfere with fertility and a woman wants to become pregnant, a caregiver may recommend having the fibroids removed.  HOME CARE INSTRUCTIONS  Home care depends on how you were treated. In general:   Keep all follow-up appointments with your caregiver.   Only take medicine as told by your caregiver. Do not take aspirin. It can cause bleeding.   If you have excessive periods and soak tampons or pads in a half hour or  less, contact your caregiver immediately. If your periods are troublesome but not so heavy, lie down with your feet raised slightly above your heart. Place cold packs on your lower abdomen.   If your periods are heavy, write down the number of pads or tampons you use per month. Bring this information to your caregiver.   Talk to your caregiver about taking iron pills.   Include green vegetables in your diet.   If you were prescribed a hormonal treatment, take the hormonal medicines as directed.   If you need surgery, ask your caregiver for information on your specific surgery.  SEEK IMMEDIATE MEDICAL CARE IF:  You have pelvic pain or cramps not controlled with medicines.   You have a sudden increase in pelvic pain.   You have an increase of bleeding between and during periods.   You feel lightheaded or have fainting episodes.  MAKE SURE YOU:  Understand these instructions.  Will watch your condition.  Will get help right away if you are not doing well or get worse. Document Released: 06/26/2000 Document Revised: 09/21/2011 Document Reviewed: 07/20/2011 [facility] Patient Information 2013 [place], [place].    Myomectomy Myoma is a non-cancerous tumor made up of fibrous tissue. It is also called leiomyoma, but more often called a fibroid tumor. Myomectomy is the removal of a fibroid tumor without removing another organ, like the uterus or ovary, with it. Fibroids range from the size of a pea to a grapefruit. They are rarely cancerous. Myomas only need treatment when they  are growing or when they cause symptoms, such aspain, pressure, bleeding, and pain with intercourse. LET YOUR CAREGIVER KNOW ABOUT:  Any allergies, especially to medicines.  If you develop a cold or an infection before your surgery.  Medicines taken, including vitamins, herbs, eyedrops, over-ther-counter medicines, and creams.  Use of steroids (by mouth or creams).  Previous problems with numbing  medicines.  History of blood clots or other bleeding problems.  Other health problems, such as diabetes, kidney, heart, or lung problems.  Previous surgery.  Possibility of pregnancy, if this applies. RISKS AND COMPLICATIONS   Excessive bleeding.  Infection.  Injury to other organs.  Blood clots in the legs, chest, and brain.  Scar tissue (adhesions) on other organs and in the pelvis.  Death during or after the surgery. BEFORE THE PROCEDURE  Follow your caregiver's advice regarding your surgery and preparing for surgery.  Avoid taking aspirin or blood thinners as directed by your caregiver.  DO NOT eat or drink anything after midnight on the night before surgery, or as directed by your caregiver.  DO NOT smoke (if you smoke) for 2 weeks before the surgery.  DO NOT drink alcohol the day before the surgery.  If you are admitted the day of the surgery,arrive1 hour before your surgery is scheduled.  Arrange to have someone take you home from the hospital.  Arrange to have someone care for you when you go home. PROCEDURE There are several ways to perform a myomectomy:  Hysteroscopy myomectomy. A lighted tube is inserted inside the uterus. The tube will remove the fibroid. This is used when the fibroid is inside the cavity of the uterus.  Laparoscopic myomectomy. A long, lighted tube is inserted through 2 or 3 small incisions to see the organs in the pelvis. The fibroid is removed.  Myomectomy through a surgical cut (incision) in the abdomen. The fibroid is removed through an incision made in the stomach. This way is performed when thethe fibroid cannot be removed with a hysteroscope or laprascope. AFTER THE PROCEDURE  If you had laparoscopic or hysteroscopic myomectomy, you may go home the same day or stay overnight.  If you had abdominal myomectomy, you may stay in the hospital a few days.  Your intravenous (IV)access tube and catheter will be removed in 1 or 2  days.  If you stay in the hospital, your caregiver will order pain medicine and a sleeping pill, if needed.  You may be placed on an antibiotic medicine, if needed.  You may be given written instructions and medicines before you are sent home. Document Released: 04/26/2007 Document Revised: 09/21/2011 Document Reviewed: 05/08/2009 [facility] Patient Information 2013 [place], [place].

## 2012-08-19 NOTE — Progress Notes (Signed)
History:  37 y.o. G1P0010 here today for discussion of management of fibroids and menorrhagia; reports having pelvic pressure/pain due to her fibroid uterus. Interested in uterine preservation. Was seen in MAU on 07/25/12 for menorrhagia, Hgb was 7.0. She declined transfusion then, and started oral iron therapy. She denies any symptoms of anemia currently, no current bleeding.  Pain is mild-moderate in intensity occasionally.  Patient also wants to get a pap smear.  The following portions of the patient's history were reviewed and updated as appropriate: allergies, current medications, past family history, past medical history, past social history, past surgical history and problem list.  Review of Systems:  Pertinent items are noted in HPI.  Objective:  Physical Exam BP 136/92  Pulse 95  Temp 97.1 F (36.2 C)  Ht 5\' 2"  (1.575 m)  Wt 270 lb 4.8 oz (122.607 kg)  BMI 49.44 kg/m2  LMP 08/07/2012 GENERAL: Well-developed, well-nourished female in no acute distress.  HEENT: Normocephalic, atraumatic. Sclerae anicteric.  NECK: Supple. Normal thyroid.  LUNGS: Clear to auscultation bilaterally.  HEART: Regular rate and rhythm. BREASTS: Large, symmetric in size. No masses, skin changes, nipple drainage, or lymphadenopathy. ABDOMEN: Soft, obese, nontender, nondistended. No organomegaly. PELVIC: Normal external female genitalia. Vagina is pink and rugated.  Normal discharge. Normal cervix contour. Pap smear obtained. Uterus is enlarged due to fibroids, unable to palpate adnexa.  Mild tenderness during bimanual exam. EXTREMITIES: No cyanosis, clubbing, or edema, 2+ distal pulses.  ENDOMETRIAL BIOPSY     The indications for endometrial biopsy were reviewed.   Risks of the biopsy including cramping, bleeding, infection, uterine perforation, inadequate specimen and need for additional procedures  were discussed. The patient states she understands and agrees to undergo procedure today. Consent was signed.  Time out was performed. Urine HCG was negative. After pap smear was done, the cervix was prepped with Betadine. A single-toothed tenaculum was placed on the anterior lip of the cervix to stabilize it. The 3 mm pipelle was introduced into the endometrial cavity without difficulty to a depth of 10 cm, and a moderate amount of tissue was obtained and sent to pathology. The instruments were removed from the patient's vagina. Minimal bleeding from the cervix was noted. The patient tolerated the procedure well. Routine post-procedure instructions were given to the patient.   Labs and Imaging 07/25/2012  TRANSABDOMINAL AND TRANSVAGINAL ULTRASOUND OF PELVIS  Clinical Data: Pain.  Previous ectopic.  History of fibroids. Technique:  Both transabdominal and transvaginal ultrasound examinations of the pelvis were performed. Transabdominal technique was performed for global imaging of the pelvis including uterus, ovaries, adnexal regions, and pelvic cul-de-sac.  It was necessary to proceed with endovaginal exam following the transabdominal exam to visualize the endometrium.  Comparison:  CT 06/26/2011  Findings:  Uterus: 7.4 x 11.2 x 18.9 cm.  Multiple fibroids.  Largest exophytic from the posterior fundus, 10 x 12.3 x 13.3 cm.  There is a 5.9 x 6 x 6.9 cm fibroid in the posterior right uterine body and a 3.3 x 3.5 x 3.7 cm fibroid on the left above the lower uterine segment.  Endometrium: 13.2 mm maximum thickness, homogeneous echotexture without focal lesion.  Right ovary:  Not visualized on transabdominal or pelvic scanning.  Left ovary: Not visualized on transabdominal or pelvic scanning.  Other findings: No free fluid  IMPRESSION:  1.  Large uterine fibroids. 2.  Nonvisualization of the ovaries.  Pelvic MR may be useful if ovarian pathology is a clinical concern.   Original Report Authenticated  By: D. Andria Rhein, MD    Assessment & Plan:  The patient will be called with the results of the biopsy and pap smear.   Discussed different management modalities for fibroids that can help with uterine preservation/future childbearing: Depo Lupron or Abdominal Myomectomy. Risks/benefits of both modalities discussed in detail, all questions answered. Patient desires a myomectomy.  Risks of surgery were discussed with the patient including but not limited to: bleeding which may require transfusion, hysterectomy or reoperation; infection which may require antibiotics; injury to bowel, bladder, ureters or other surrounding organs; need for additional procedures such as hysterectomy; thromboembolic phenomenon, incisional problems and other postoperative/anesthesia complications. She was also told that if there is a breach into the endometrial cavity or large myometrial defect, she will need cesarean section for any deliveries in the future.  The postoperative expectations were also discussed in detail. The patient also understands the alternative treatment options which were discussed in full.   She was told that she will be contacted by our surgical scheduler regarding the time and date of her surgery; routine preoperative instructions of having nothing to eat or drink after midnight on the day prior to surgery and also coming to the hospital 1 1/2 hours prior to her time of surgery were also emphasized.  She was told she may be called for a preoperative appointment about a week prior to surgery and will be given further preoperative instructions at that visit.  Printed patient education handouts about the procedure were given to the patient to review at home.  In the meantime, Provera was prescribed as needed for bleeding. Bleeding precautions reviewed.

## 2012-08-30 ENCOUNTER — Encounter: Payer: Self-pay | Admitting: *Deleted

## 2012-09-21 ENCOUNTER — Encounter: Payer: Self-pay | Admitting: Obstetrics & Gynecology

## 2012-09-30 ENCOUNTER — Encounter (HOSPITAL_COMMUNITY): Payer: Self-pay | Admitting: General Practice

## 2012-09-30 ENCOUNTER — Inpatient Hospital Stay (HOSPITAL_COMMUNITY)
Admission: AD | Admit: 2012-09-30 | Discharge: 2012-09-30 | Disposition: A | Discharge status: Home / Self Care | Payer: Medicare Other | Source: Ambulatory Visit | Attending: Obstetrics & Gynecology | Admitting: Obstetrics & Gynecology

## 2012-09-30 ENCOUNTER — Telehealth: Payer: Self-pay | Admitting: *Deleted

## 2012-09-30 DIAGNOSIS — N920 Excessive and frequent menstruation with regular cycle: Secondary | ICD-10-CM | POA: Insufficient documentation

## 2012-09-30 DIAGNOSIS — D219 Benign neoplasm of connective and other soft tissue, unspecified: Secondary | ICD-10-CM

## 2012-09-30 DIAGNOSIS — D259 Leiomyoma of uterus, unspecified: Secondary | ICD-10-CM | POA: Insufficient documentation

## 2012-09-30 DIAGNOSIS — R109 Unspecified abdominal pain: Secondary | ICD-10-CM | POA: Insufficient documentation

## 2012-09-30 LAB — CBC
MCH: 15.2 pg — ABNORMAL LOW (ref 26.0–34.0)
MCHC: 27.5 g/dL — ABNORMAL LOW (ref 30.0–36.0)
MCV: 55.1 fL — ABNORMAL LOW (ref 78.0–100.0)
Platelets: 753 10*3/uL — ABNORMAL HIGH (ref 150–400)
RDW: 22.1 % — ABNORMAL HIGH (ref 11.5–15.5)

## 2012-09-30 MED ORDER — ESTRADIOL 2 MG PO TABS: 2.0000 mg | ORAL_TABLET | Freq: Every day | ORAL | Status: DC

## 2012-09-30 MED ORDER — OXYCODONE-ACETAMINOPHEN 5-325 MG PO TABS
2.0000 | ORAL_TABLET | Freq: Once | ORAL | Status: DC
  Filled 2012-09-30: qty 2

## 2012-09-30 MED ORDER — KETOROLAC TROMETHAMINE 60 MG/2ML IM SOLN
60.0000 mg | Freq: Once | INTRAMUSCULAR | Status: AC
  Administered 2012-09-30: 60 mg via INTRAMUSCULAR
  Filled 2012-09-30: qty 2

## 2012-09-30 MED ORDER — MEDROXYPROGESTERONE ACETATE 10 MG PO TABS: 20.0000 mg | ORAL_TABLET | Freq: Every day | ORAL | Status: DC

## 2012-09-30 MED ORDER — NAPROXEN 500 MG PO TABS: 500.0000 mg | ORAL_TABLET | Freq: Two times a day (BID) | ORAL | Status: DC

## 2012-09-30 MED ORDER — OXYCODONE-ACETAMINOPHEN 5-325 MG PO TABS: 1.0000 | ORAL_TABLET | Freq: Four times a day (QID) | ORAL | Status: DC | PRN

## 2012-09-30 NOTE — MAU Note (Signed)
Patient states she has known fibroids and is scheduled for a myomectomy in May. Called the [facility] for a pain medication refill and was not able to talk with anyone. States she has bleeding since 3-4 every day, some light. Pain has been for the past 2 days. Was put on a medication for the bleeding, stopped it on 2-28 then the bleeding started and she started taking again but has not stopped the bleeding.

## 2012-09-30 NOTE — MAU Note (Signed)
Pt eating saltines.

## 2012-09-30 NOTE — MAU Provider Note (Signed)
History     CSN: [id number]  Arrival date and time: 09/30/12 1159   None     Chief Complaint  Patient presents with  . Abdominal Pain  . Vaginal Bleeding   HPI 37 y.o. G1P0010 with ongoing pain r/t fibroids - worsened today around noon. Started bleeding on 3/4, had been on Provera until 2/28, stopped then restarted provera after bleeding started back. Bleeding hasn't stopped since. Pt won't quantify how much bleeding, but says that it's increasing some. Has myomectomy scheduled in May. Hemoglobin was 7.0 in January.    Past Medical History  Diagnosis Date  . Asthma   . Environmental allergies   . Fibroids   . Shortness of breath   . GERD (gastroesophageal reflux disease)   . Anemia   . Fibroid, uterine   . Menorrhagia     Past Surgical History  Procedure Laterality Date  . Ectopic pregnancy surgery    . Myomectomy    . Cholecystectomy      06/29/2011  . Cholecystectomy  06/27/2011    Procedure: LAPAROSCOPIC CHOLECYSTECTOMY WITH INTRAOPERATIVE CHOLANGIOGRAM;  Surgeon: Atilano Ina, MD;  Location: [facility] OR;  Service: General;  Laterality: N/A;  . Cholecystectomy  06/29/2011    Procedure: LAPAROSCOPIC CHOLECYSTECTOMY WITH INTRAOPERATIVE CHOLANGIOGRAM;  Surgeon: Robyne Askew, MD;  Location: [facility] OR;  Service: General;  Laterality: N/A;  . Ercp  06/30/2011    Procedure: ENDOSCOPIC RETROGRADE CHOLANGIOPANCREATOGRAPHY (ERCP);  Surgeon: Petra Kuba, MD;  Location: [facility] ENDOSCOPY;  Service: Endoscopy;  Laterality: N/A;  probable sphincterotomy  . Ercp  07/03/2011    Procedure: ENDOSCOPIC RETROGRADE CHOLANGIOPANCREATOGRAPHY (ERCP);  Surgeon: Freddy Jaksch, MD;  Location: [facility] OR;  Service: Endoscopy;  Laterality: N/A;  PROPOFOL    Family History  Problem Relation Age of Onset  . Anesthesia problems Neg Hx   . Hypotension Neg Hx   . Malignant hyperthermia Neg Hx   . Pseudochol deficiency Neg Hx   . Hypertension Mother   . Diabetes Mother   . Hypertension Father   .  Hypertension Brother     History  Substance Use Topics  . Smoking status: Never Smoker   . Smokeless tobacco: Never Used  . Alcohol Use: No    Allergies:  Allergies  Allergen Reactions  . Bee Venom Anaphylaxis    Patient has an epi pen, also has not been stung before but was informed she is allergic  . Prednisone Hives    Prescriptions prior to admission  Medication Sig Dispense Refill  . acetaminophen (TYLENOL) 325 MG tablet Take 650 mg by mouth every 6 (six) hours as needed for pain.      Marland Kitchen albuterol (PROVENTIL HFA;VENTOLIN HFA) 108 (90 BASE) MCG/ACT inhaler Inhale 2 puffs into the lungs every 6 (six) hours as needed. For wheezing      . albuterol (PROVENTIL) (2.5 MG/3ML) 0.083% nebulizer solution Take 2.5 mg by nebulization every 6 (six) hours as needed. For wheezing      . budesonide-formoterol (SYMBICORT) 160-4.5 MCG/ACT inhaler Inhale 2 puffs into the lungs 2 (two) times daily.        Marland Kitchen ipratropium-albuterol (DUONEB) 0.5-2.5 (3) MG/3ML SOLN Take 3 mLs by nebulization every 4 (four) hours as needed. For wheezing      . medroxyPROGESTERone (PROVERA) 10 MG tablet Take 2 tablets (20 mg total) by mouth daily.  60 tablet  4  . montelukast (SINGULAIR) 10 MG tablet Take 10 mg by mouth at bedtime.  Review of Systems  Constitutional: Negative.   Respiratory: Negative.   Cardiovascular: Negative.   Gastrointestinal: Positive for abdominal pain. Negative for nausea, vomiting, diarrhea and constipation.  Genitourinary: Negative for dysuria, urgency, frequency, hematuria and flank pain.       + vaginal bleeding   Musculoskeletal: Negative.   Neurological: Negative.   Psychiatric/Behavioral: Negative.    Physical Exam   Blood pressure 144/91, pulse 92, temperature 98.6 F (37 C), temperature source Oral, resp. rate 18, height 5\' 2"  (1.575 m), weight 259 lb 12.8 oz (117.845 kg), last menstrual period 09/13/2012, SpO2 100.00%.  Physical Exam  Nursing note and vitals  reviewed. Constitutional: She is oriented to person, place, and time. She appears well-developed and well-nourished. She appears distressed (uncomfortable appearing).  Cardiovascular: Normal rate.   Respiratory: Effort normal.  Musculoskeletal: Normal range of motion.  Neurological: She is alert and oriented to person, place, and time.  Skin: Skin is warm and dry.  Psychiatric: She has a normal mood and affect.    MAU Course  Procedures Results for orders placed during the hospital encounter of 09/30/12 (from the past 24 hour(s))  CBC     Status: Abnormal   Collection Time    09/30/12 12:24 PM      Result Value Range   WBC 15.0 (*) 4.0 - 10.5 K/uL   RBC 4.68  3.87 - 5.11 MIL/uL   Hemoglobin 7.1 (*) 12.0 - 15.0 g/dL   HCT 82.9 (*) 56.2 - 13.0 %   MCV 55.1 (*) 78.0 - 100.0 fL   MCH 15.2 (*) 26.0 - 34.0 pg   MCHC 27.5 (*) 30.0 - 36.0 g/dL   RDW 86.5 (*) 78.4 - 69.6 %   Platelets 753 (*) 150 - 400 K/uL   No change in Hgb from January    Assessment and Plan   1. Fibroids   2. Menorrhagia   Discussed with Dr. Debroah Loop, will add Estrace 2 mg x 7 days to stop bleeding, continue on Provera until follow up. Precautions rev'd with estrogen.  Gave Naprosyn and Percocet #120 - enough to last 1 month until next visit     Medication List    TAKE these medications       acetaminophen 325 MG tablet  Commonly known as:  TYLENOL  Take 650 mg by mouth every 6 (six) hours as needed for pain.     albuterol 108 (90 BASE) MCG/ACT inhaler  Commonly known as:  PROVENTIL HFA;VENTOLIN HFA  Inhale 2 puffs into the lungs every 6 (six) hours as needed. For wheezing     albuterol (2.5 MG/3ML) 0.083% nebulizer solution  Commonly known as:  PROVENTIL  Take 2.5 mg by nebulization every 6 (six) hours as needed. For wheezing     budesonide-formoterol 160-4.5 MCG/ACT inhaler  Commonly known as:  SYMBICORT  Inhale 2 puffs into the lungs 2 (two) times daily.     estradiol 2 MG tablet  Commonly  known as:  ESTRACE  Take 1 tablet (2 mg total) by mouth daily.     ipratropium-albuterol 0.5-2.5 (3) MG/3ML Soln  Commonly known as:  DUONEB  Take 3 mLs by nebulization every 4 (four) hours as needed. For wheezing     medroxyPROGESTERone 10 MG tablet  Commonly known as:  PROVERA  Take 2 tablets (20 mg total) by mouth daily.     montelukast 10 MG tablet  Commonly known as:  SINGULAIR  Take 10 mg by mouth at bedtime.  naproxen 500 MG tablet  Commonly known as:  NAPROSYN  Take 1 tablet (500 mg total) by mouth 2 (two) times daily with a meal.     oxyCODONE-acetaminophen 5-325 MG per tablet  Commonly known as:  PERCOCET/ROXICET  Take 1 tablet by mouth every 6 (six) hours as needed for pain.            Follow-up Information   Follow up with [facility]. (as scheduled or sooner as needed)    Contact information:   [address] [place] [address] [phone]        [facility] 09/30/2012, 2:59 PM

## 2012-09-30 NOTE — Telephone Encounter (Signed)
Pt left message requesting a call back from nurse. I called pt and heard message stating that the mail box is full.

## 2012-09-30 NOTE — MAU Note (Signed)
Pt stated she has fibroids run out of pain meds, she said her pain is worse and over 10 and she is bleeding which started 09/13/2012.

## 2012-10-03 NOTE — Telephone Encounter (Signed)
Patient called and left message stating she hasn't heard from anyone yet about test results and wanted to make sure she did not miss a phone call.

## 2012-10-03 NOTE — Telephone Encounter (Signed)
Called patient back and told her I was returning her phone call. Patient appreciated phone call but stated she was already taken care of because she ended up going to MAU. Patient stated they told her to follow up in 2 weeks but says she already has an appt for 4/21. Told patient upon reviewing her visit to MAU it looks like they gave her enough medicine to last till her 4/21 appt so that should be fine however if she feels at a later point that she cannot make it till then she can call back and let us know. Patient verbalized understanding and had no further questions

## 2012-10-26 ENCOUNTER — Encounter (HOSPITAL_COMMUNITY): Payer: Self-pay | Admitting: Pharmacist

## 2012-10-31 ENCOUNTER — Encounter: Payer: Self-pay | Admitting: Obstetrics & Gynecology

## 2012-10-31 ENCOUNTER — Encounter: Payer: Self-pay | Admitting: Obstetrics and Gynecology

## 2012-10-31 ENCOUNTER — Ambulatory Visit (INDEPENDENT_AMBULATORY_CARE_PROVIDER_SITE_OTHER): Payer: Medicaid Other | Admitting: Obstetrics & Gynecology

## 2012-10-31 VITALS — BP 127/80 | HR 115 | Temp 99.0°F | Ht 62.0 in | Wt 254.0 lb

## 2012-10-31 DIAGNOSIS — D219 Benign neoplasm of connective and other soft tissue, unspecified: Secondary | ICD-10-CM

## 2012-10-31 DIAGNOSIS — Z01818 Encounter for other preprocedural examination: Secondary | ICD-10-CM

## 2012-10-31 DIAGNOSIS — D259 Leiomyoma of uterus, unspecified: Secondary | ICD-10-CM

## 2012-10-31 NOTE — Patient Instructions (Signed)
Return to clinic for any scheduled appointments or for any gynecologic concerns as needed.   

## 2012-10-31 NOTE — Progress Notes (Signed)
History:   37 y.o. G1P0010 here today for discussion of upcoming abdominal myomectomy.  Interested in uterine preservation. Was seen in MAU on 09/30/12 for menorrhagia, Hgb was 7.1. She declined transfusion then again, and still on oral iron therapy. She denies any symptoms of anemia currently, no current bleeding. Pain is mild-moderate in intensity occasionally alleviated with NSAIDs.   The following portions of the patient's history were reviewed and updated as appropriate: allergies, current medications, past family history, past medical history, past social history, past surgical history and problem list. Last pap in 08/18/2012.  Review of Systems:   Pertinent items are noted in HPI.   Objective:   Physical Exam  BP 127/80  Pulse 115  Temp(Src) 99 F (37.2 C) (Oral)  Ht 5\' 2"  (1.575 m)  Wt 254 lb (115.214 kg)  BMI 46.45 kg/m2 GENERAL: Well-developed, well-nourished female in no acute distress.  ABDOMEN: Soft, obese, nontender, nondistended. No organomegaly.  PELVIC: Deferred EXTREMITIES: No cyanosis, clubbing, or edema, 2+ distal pulses.   Labs and Imaging  08/18/2012  Pap smear negative, negative HRHPV.   08/18/2012 Endometrium, biopsy: PROLIFERATIVE PHASE ENDOMETRIUM.  NEGATIVE FOR HYPERPLASIA OR MALIGNANCY.  BENIGN ENDOCERVICAL MUCOSA.  07/25/2012 TRANSABDOMINAL AND TRANSVAGINAL ULTRASOUND OF PELVIS Clinical Data: Pain. Previous ectopic. History of fibroids. Technique: Both transabdominal and transvaginal ultrasound examinations of the pelvis were performed. Transabdominal technique was performed for global imaging of the pelvis including uterus, ovaries, adnexal regions, and pelvic cul-de-sac. It was necessary to proceed with endovaginal exam following the transabdominal exam to visualize the endometrium. Comparison: CT 06/26/2011 Findings: Uterus: 7.4 x 11.2 x 18.9 cm. Multiple fibroids. Largest exophytic from the posterior fundus, 10 x 12.3 x 13.3 cm. There is a 5.9 x 6 x 6.9 cm fibroid  in the posterior right uterine body and a 3.3 x 3.5 x 3.7 cm fibroid on the left above the lower uterine segment. Endometrium: 13.2 mm maximum thickness, homogeneous echotexture without focal lesion. Right ovary: Not visualized on transabdominal or pelvic scanning. Left ovary: Not visualized on transabdominal or pelvic scanning. Other findings: No free fluid IMPRESSION: 1. Large uterine fibroids. 2. Nonvisualization of the ovaries. Pelvic MR may be useful if ovarian pathology is a clinical concern. Original Report Authenticated By: D. Andria Rhein, MD   Assessment & Plan:   Patient scheduled for myomectomy on 11/13/12. Risks of surgery were rediscussed with the patient including but not limited to: bleeding which may require transfusion, hysterectomy or reoperation; infection which may require antibiotics; injury to bowel, bladder, ureters or other surrounding organs; need for additional procedures such as hysterectomy; thromboembolic phenomenon, incisional problems and other postoperative/anesthesia complications. She was also told that if there is a breach into the endometrial cavity or large myometrial defect, she will need cesarean section for any deliveries in the future. The postoperative expectations were also discussed in detail. The patient also understands the alternative treatment options which were discussed in full; also offered robotic myomectomy by Dr. April Manson (will contact his office to see if this is possible).  Her preoperative appointment is on 11/08/12, will follow up labs  and manage accordingly. Bleeding precautions reviewed.

## 2012-11-08 ENCOUNTER — Inpatient Hospital Stay (HOSPITAL_COMMUNITY): Admission: RE | Admit: 2012-11-08 | Payer: Medicare Other | Source: Ambulatory Visit

## 2012-11-09 ENCOUNTER — Encounter (HOSPITAL_COMMUNITY): Payer: Self-pay

## 2012-11-09 ENCOUNTER — Encounter (HOSPITAL_COMMUNITY)
Admission: RE | Admit: 2012-11-09 | Discharge: 2012-11-09 | Disposition: A | Discharge status: Home / Self Care | Payer: Medicare Other | Source: Ambulatory Visit | Attending: Obstetrics & Gynecology | Admitting: Obstetrics & Gynecology

## 2012-11-09 LAB — CBC
HCT: 24.9 % — ABNORMAL LOW (ref 36.0–46.0)
Hemoglobin: 6.6 g/dL — CL (ref 12.0–15.0)
MCH: 14.5 pg — ABNORMAL LOW (ref 26.0–34.0)
MCV: 54.6 fL — ABNORMAL LOW (ref 78.0–100.0)
RBC: 4.56 MIL/uL (ref 3.87–5.11)
WBC: 8.7 10*3/uL (ref 4.0–10.5)

## 2012-11-09 LAB — SURGICAL PCR SCREEN: MRSA, PCR: POSITIVE — AB

## 2012-11-09 NOTE — Patient Instructions (Addendum)
20 [name]  11/09/2012   Your procedure is scheduled on:  11/14/12  Enter through the Main Entrance of [facility] at 8 AM.  Pick up the phone at the desk and dial [phone].   Call this number if you have problems the morning of surgery: [phone]   Remember:   Do not eat food:After Midnight.  Do not drink clear liquids: After Midnight.  Take these medicines the morning of surgery with A SIP OF WATER: Zantac   Do not wear jewelry, make-up or nail polish.  Do not wear lotions, powders, or perfumes. You may wear deodorant.  Do not shave 48 hours prior to surgery.  Do not bring valuables to the hospital.  Contacts, dentures or bridgework may not be worn into surgery.  Leave suitcase in the car. After surgery it may be brought to your room.  For patients admitted to the hospital, checkout time is 11:00 AM the day of discharge.   Patients discharged the day of surgery will not be allowed to drive home.  Name and phone number of your driver: NA  Special Instructions: Shower using CHG 2 nights before surgery and the night before surgery.  If you shower the day of surgery use CHG.  Use special wash - you have one bottle of CHG for all showers.  You should use approximately 1/3 of the bottle for each shower.   Please read over the following fact sheets that you were given: MRSA Information

## 2012-11-09 NOTE — Pre-Procedure Instructions (Signed)
Hgb of 6.6 called to Elsie Lincoln, MD. Order given to send computer message to her and Dr. Macon Large in order for them to make arrangements of possible transfusion on 11/13/12

## 2012-11-10 ENCOUNTER — Telehealth: Payer: Self-pay

## 2012-11-10 NOTE — Telephone Encounter (Signed)
Pt called and wanted to speak with Dr. Macon Large. 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 And that it is a protocol that a MRSA swab is done with all pt before having surgery. "  I informed pt of what provider informed me.  Pt stated understanding and did not have any other questions.

## 2012-11-11 DIAGNOSIS — D500 Iron deficiency anemia secondary to blood loss (chronic): Secondary | ICD-10-CM | POA: Diagnosis present

## 2012-11-13 ENCOUNTER — Inpatient Hospital Stay (HOSPITAL_COMMUNITY)
Admission: AD | Admit: 2012-11-13 | Discharge: 2012-11-18 | DRG: 743 | Disposition: A | Discharge status: Home / Self Care | Payer: Medicare Other | Source: Ambulatory Visit | Attending: Obstetrics & Gynecology | Admitting: Obstetrics & Gynecology

## 2012-11-13 ENCOUNTER — Encounter (HOSPITAL_COMMUNITY): Payer: Self-pay | Admitting: *Deleted

## 2012-11-13 DIAGNOSIS — D219 Benign neoplasm of connective and other soft tissue, unspecified: Secondary | ICD-10-CM

## 2012-11-13 DIAGNOSIS — N920 Excessive and frequent menstruation with regular cycle: Principal | ICD-10-CM | POA: Diagnosis present

## 2012-11-13 DIAGNOSIS — D259 Leiomyoma of uterus, unspecified: Secondary | ICD-10-CM | POA: Diagnosis present

## 2012-11-13 DIAGNOSIS — D500 Iron deficiency anemia secondary to blood loss (chronic): Secondary | ICD-10-CM | POA: Diagnosis present

## 2012-11-13 HISTORY — DX: Encounter for other specified aftercare: Z51.89

## 2012-11-13 LAB — PREPARE RBC (CROSSMATCH)

## 2012-11-13 MED ORDER — PNEUMOCOCCAL VAC POLYVALENT 25 MCG/0.5ML IJ INJ: 0.5000 mL | INJECTION | INTRAMUSCULAR | Status: DC | PRN

## 2012-11-13 MED ORDER — CEFAZOLIN SODIUM-DEXTROSE 2-3 GM-% IV SOLR: 2.0000 g | INTRAVENOUS | Status: DC

## 2012-11-13 MED ORDER — LACTATED RINGERS IV SOLN
INTRAVENOUS | Status: DC
  Administered 2012-11-14: 01:00:00 via INTRAVENOUS

## 2012-11-13 MED ORDER — PANTOPRAZOLE SODIUM 40 MG PO TBEC
40.0000 mg | DELAYED_RELEASE_TABLET | Freq: Once | ORAL | Status: AC
  Administered 2012-11-14: 40 mg via ORAL
  Filled 2012-11-13: qty 1

## 2012-11-13 MED ORDER — HYDROMORPHONE HCL PF 1 MG/ML IJ SOLN: 0.2000 mg | INTRAMUSCULAR | Status: DC | PRN

## 2012-11-13 MED ORDER — PANTOPRAZOLE SODIUM 40 MG PO TBEC
40.0000 mg | DELAYED_RELEASE_TABLET | Freq: Once | ORAL | Status: DC
  Filled 2012-11-13: qty 1

## 2012-11-13 MED ORDER — ACETAMINOPHEN 500 MG PO TABS
1000.0000 mg | ORAL_TABLET | Freq: Once | ORAL | Status: AC
  Administered 2012-11-13: 1000 mg via ORAL
  Filled 2012-11-13: qty 2

## 2012-11-13 MED ORDER — LACTATED RINGERS IV SOLN: INTRAVENOUS | Status: DC

## 2012-11-13 MED ORDER — LORATADINE 10 MG PO TABS
10.0000 mg | ORAL_TABLET | Freq: Every day | ORAL | Status: DC
  Administered 2012-11-13 – 2012-11-18 (×6): 10 mg via ORAL
  Filled 2012-11-13 (×7): qty 1

## 2012-11-13 MED ORDER — DOCUSATE SODIUM 100 MG PO CAPS: 100.0000 mg | ORAL_CAPSULE | Freq: Two times a day (BID) | ORAL | Status: DC | PRN

## 2012-11-13 MED ORDER — ONDANSETRON HCL 4 MG/2ML IJ SOLN: 4.0000 mg | Freq: Four times a day (QID) | INTRAMUSCULAR | Status: DC | PRN

## 2012-11-13 MED ORDER — ZOLPIDEM TARTRATE 5 MG PO TABS: 5.0000 mg | ORAL_TABLET | Freq: Every evening | ORAL | Status: DC | PRN

## 2012-11-13 MED ORDER — OXYCODONE-ACETAMINOPHEN 5-325 MG PO TABS
1.0000 | ORAL_TABLET | ORAL | Status: DC | PRN
  Administered 2012-11-13: 2 via ORAL
  Filled 2012-11-13: qty 2

## 2012-11-13 MED ORDER — MUPIROCIN 2 % EX OINT
TOPICAL_OINTMENT | Freq: Two times a day (BID) | CUTANEOUS | Status: AC
  Administered 2012-11-13 – 2012-11-17 (×8): via NASAL
  Filled 2012-11-13: qty 22

## 2012-11-13 MED ORDER — ONDANSETRON HCL 4 MG PO TABS: 4.0000 mg | ORAL_TABLET | Freq: Four times a day (QID) | ORAL | Status: DC | PRN

## 2012-11-13 MED ORDER — FAMOTIDINE 20 MG PO TABS: 20.0000 mg | ORAL_TABLET | Freq: Once | ORAL | Status: DC

## 2012-11-13 MED ORDER — DIPHENHYDRAMINE HCL 25 MG PO CAPS
25.0000 mg | ORAL_CAPSULE | Freq: Once | ORAL | Status: AC
  Administered 2012-11-13: 25 mg via ORAL
  Filled 2012-11-13: qty 1

## 2012-11-13 MED ORDER — ALBUTEROL SULFATE HFA 108 (90 BASE) MCG/ACT IN AERS
2.0000 | INHALATION_SPRAY | RESPIRATORY_TRACT | Status: DC | PRN
  Administered 2012-11-13 – 2012-11-14 (×2): 2 via RESPIRATORY_TRACT

## 2012-11-13 MED ORDER — ALBUTEROL SULFATE HFA 108 (90 BASE) MCG/ACT IN AERS
2.0000 | INHALATION_SPRAY | RESPIRATORY_TRACT | Status: DC
  Filled 2012-11-13: qty 6.7

## 2012-11-13 MED ORDER — ACETAMINOPHEN 325 MG PO TABS: 650.0000 mg | ORAL_TABLET | ORAL | Status: DC | PRN

## 2012-11-13 NOTE — Progress Notes (Signed)
Called to see pt re nasal congestion and history of Asthma.  Subjective: Patient reports nasal congestion, moderate,  Denies coughing or shortness of breath.  Pt reports a history of Asthma.  She is not currently on asthma therapy, and only requires it prn.  None recently required. She does NOT feel she is having breathing difficulty now, only nasal congestion. Denies fever.     Objective: I have reviewed patient's vital signs and labs. BP 126/72  Pulse 90  Temp(Src) 98.7 F (37.1 C) (Oral)  Resp 18  Ht 5\' 2"  (1.575 m)  Wt 113.399 kg (250 lb)  BMI 45.71 kg/m2  SpO2 100%  General: alert, cooperative and no distress Resp: wheezes RLL and RML Cardio: S1, S2 normal and no S3 or S4 No wheezes in left lung  Assessment/Plan: Upper respiratory symptoms Asthma, mild  Preop admit for transfusions for abdominal myomectomy. Plan:  Case discusse with Dr Arby Barrette, Anesthessilology, who desires to porceed with surgical plan .Marland Kitchen Option of Spinal anesthesia considered a good option.  Begun on Albuterol inhaler, plus claritin       LOS: 0 days     [name] V 11/13/2012, 5:51 PM

## 2012-11-13 NOTE — Anesthesia Preprocedure Evaluation (Signed)
Anesthesia Evaluation  Patient identified by MRN, date of birth, ID band Patient awake    Reviewed: Allergy & Precautions, H&P , NPO status , Patient's Chart, lab work & pertinent test results  Airway Mallampati: II TM Distance: >3 FB Neck ROM: full    Dental no notable dental hx. (+) Teeth Intact   Pulmonary asthma ,   Sinusitis 2/5 severity according to patient. Pulmonary exam normal       Cardiovascular negative cardio ROS      Neuro/Psych negative neurological ROS  negative psych ROS   GI/Hepatic Neg liver ROS,   Endo/Other  Morbid obesity  Renal/GU negative Renal ROS  negative genitourinary   Musculoskeletal negative musculoskeletal ROS (+)   Abdominal Normal abdominal exam  (+)   Peds negative pediatric ROS (+)  Hematology negative hematology ROS (+)   Anesthesia Other Findings   Reproductive/Obstetrics negative OB ROS                           Anesthesia Physical Anesthesia Plan  ASA: III  Anesthesia Plan: General   Post-op Pain Management:    Induction: Intravenous  Airway Management Planned: Oral ETT  Additional Equipment:   Intra-op Plan:   Post-operative Plan: Extubation in OR  Informed Consent: I have reviewed the patients History and Physical, chart, labs and discussed the procedure including the risks, benefits and alternatives for the proposed anesthesia with the patient or authorized representative who has indicated his/her understanding and acceptance.   Dental Advisory Given  Plan Discussed with: CRNA and Surgeon  Anesthesia Plan Comments:         Anesthesia Quick Evaluation

## 2012-11-13 NOTE — H&P (Signed)
Preoperative History and Physical  [name] is a 37 y.o. G1P0010 admitted today for preoperative blood transfusion given hemoglobin of 6.6.  She is scheduled to undergo abdominal myomectomy tomorrow morning for symptomatic fibroids,menorrhagia and anemia.    Patient Active Problem List   Diagnosis Date Noted  . 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 Fibroids 08/18/2012  . Menorrhagia 08/18/2012  . Asthma 08/15/2011  . Cholecystitis 06/29/2011    Past Medical History  Diagnosis Date  . Asthma   . Environmental allergies   . Fibroids   . Shortness of breath   . GERD (gastroesophageal reflux disease)   . Anemia   . Fibroid, uterine   . Menorrhagia   . Blood transfusion without reported diagnosis    Past Surgical History  Procedure Laterality Date  . Ectopic pregnancy surgery    . Myomectomy    . Cholecystectomy      06/29/2011  . Cholecystectomy  06/27/2011    Procedure: LAPAROSCOPIC CHOLECYSTECTOMY WITH INTRAOPERATIVE CHOLANGIOGRAM;  Surgeon: Atilano Ina, MD;  Location: [facility] OR;  Service: General;  Laterality: N/A;  . Cholecystectomy  06/29/2011    Procedure: LAPAROSCOPIC CHOLECYSTECTOMY WITH INTRAOPERATIVE CHOLANGIOGRAM;  Surgeon: Robyne Askew, MD;  Location: [facility] OR;  Service: General;  Laterality: N/A;  . Ercp  06/30/2011    Procedure: ENDOSCOPIC RETROGRADE CHOLANGIOPANCREATOGRAPHY (ERCP);  Surgeon: Petra Kuba, MD;  Location: [facility] ENDOSCOPY;  Service: Endoscopy;  Laterality: N/A;  probable sphincterotomy  . Ercp  07/03/2011    Procedure: ENDOSCOPIC RETROGRADE CHOLANGIOPANCREATOGRAPHY (ERCP);  Surgeon: Freddy Jaksch, MD;  Location: [facility] OR;  Service: Endoscopy;  Laterality: N/A;  PROPOFOL   OB History   Grav Para Term Preterm Abortions TAB SAB Ect Mult Living   1    1   1        Patient denies any cervical dysplasia or STIs. Prescriptions prior to admission  Medication Sig Dispense Refill  . albuterol (PROVENTIL HFA;VENTOLIN HFA) 108 (90 BASE)  MCG/ACT inhaler Inhale 2 puffs into the lungs every 6 (six) hours as needed. For wheezing      . albuterol (PROVENTIL) (2.5 MG/3ML) 0.083% nebulizer solution Take 2.5 mg by nebulization every 6 (six) hours as needed. For wheezing      . budesonide-formoterol (SYMBICORT) 160-4.5 MCG/ACT inhaler Inhale 2 puffs into the lungs 2 (two) times daily.        Marland Kitchen ipratropium-albuterol (DUONEB) 0.5-2.5 (3) MG/3ML SOLN Take 3 mLs by nebulization every 4 (four) hours as needed. For wheezing      . medroxyPROGESTERone (PROVERA) 10 MG tablet Take 2 tablets (20 mg total) by mouth daily.  60 tablet  4  . montelukast (SINGULAIR) 10 MG tablet Take 10 mg by mouth at bedtime.        . naproxen (NAPROSYN) 500 MG tablet Take 1 tablet (500 mg total) by mouth 2 (two) times daily with a meal.  60 tablet  2  . oxyCODONE-acetaminophen (PERCOCET/ROXICET) 5-325 MG per tablet Take 1 tablet by mouth every 6 (six) hours as needed for pain.  120 tablet  0  . ranitidine (ZANTAC) 150 MG capsule Take 150 mg by mouth 2 (two) times daily as needed for heartburn.         Allergies  Allergen Reactions  . Bee Venom Anaphylaxis    Patient has an epi pen, also has not been stung before but was informed she is allergic  . Prednisone Hives   Social History:   reports that  she has never smoked. She has never used smokeless tobacco. She reports that she does not drink alcohol or use illicit drugs. Family History  Problem Relation Age of Onset  . Anesthesia problems Neg Hx   . Hypotension Neg Hx   . Malignant hyperthermia Neg Hx   . Pseudochol deficiency Neg Hx   . Hypertension Mother   . Diabetes Mother   . Hypertension Father   . Hypertension Brother     Review of Systems: Noncontributory  PHYSICAL EXAM: Blood pressure 123/62, pulse 88, temperature 98.1 F (36.7 C), temperature source Oral, resp. rate 19, height 5\' 2"  (1.575 m), weight 250 lb (113.399 kg), SpO2 100.00%. General appearance - alert, well appearing, and in no  distress Abdomen - soft, obese, nontender, nondistended, no masses or organomegaly Pelvic - examination not indicated Extremities - peripheral pulses normal, no pedal edema, no clubbing or cyanosis  Labs: Results for orders placed during the hospital encounter of 11/13/12 (from the past 336 hour(s))  TYPE AND SCREEN   Collection Time    11/13/12  1:54 PM      Result Value Range   ABO/RH(D) O POS     Antibody Screen NEG     Sample Expiration 11/16/2012     Unit Number [id number]     Blood Component Type RED CELLS,LR     Unit division 00     Status of Unit ISSUED     Transfusion Status OK TO TRANSFUSE     Crossmatch Result Compatible     Unit Number [id number]     Blood Component Type RED CELLS,LR     Unit division 00     Status of Unit ISSUED     Transfusion Status OK TO TRANSFUSE     Crossmatch Result Compatible    PREPARE RBC (CROSSMATCH)   Collection Time    11/13/12  2:00 PM      Result Value Range   Order Confirmation ORDER PROCESSED BY BLOOD BANK    PREGNANCY, URINE   Collection Time    11/13/12  2:13 PM      Result Value Range   Preg Test, Ur NEGATIVE  NEGATIVE  Results for orders placed during the hospital encounter of 11/09/12 (from the past 336 hour(s))  CBC   Collection Time    11/09/12 11:50 AM      Result Value Range   WBC 8.7  4.0 - 10.5 K/uL   RBC 4.56  3.87 - 5.11 MIL/uL   Hemoglobin 6.6 (*) 12.0 - 15.0 g/dL   HCT 30.8 (*) 65.7 - 84.6 %   MCV 54.6 (*) 78.0 - 100.0 fL   MCH 14.5 (*) 26.0 - 34.0 pg   MCHC 26.5 (*) 30.0 - 36.0 g/dL   RDW 96.2 (*) 95.2 - 84.1 %   Platelets 522 (*) 150 - 400 K/uL  SURGICAL PCR SCREEN   Collection Time    11/09/12 12:05 PM      Result Value Range   MRSA, PCR POSITIVE (*) NEGATIVE   Staphylococcus aureus POSITIVE (*) NEGATIVE    Imaging Studies: No results found.  Assessment: Patient Active Problem List   Diagnosis Date Noted  . 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 Fibroids 08/18/2012  .  Menorrhagia 08/18/2012  . Asthma 08/15/2011  . Cholecystitis 06/29/2011    Plan: Patient is here today for preoperative transfusion; will undergo abdominal myomectomy tomorrow. - Transfuse with 4 units of pRBCs - Will check CBC in am prior  to surgery - NPO after midnight - Anesthesia notified of patient's preoperative admission - Routine inpatient care  Jaynie Collins, MD, FACOG Attending Obstetrician & Gynecologist Faculty Practice, [facility] [place]

## 2012-11-14 ENCOUNTER — Encounter (HOSPITAL_COMMUNITY)
Admission: AD | Disposition: A | Discharge status: Home / Self Care | Payer: Self-pay | Source: Ambulatory Visit | Attending: Obstetrics & Gynecology

## 2012-11-14 ENCOUNTER — Encounter (HOSPITAL_COMMUNITY): Payer: Self-pay | Admitting: Anesthesiology

## 2012-11-14 ENCOUNTER — Inpatient Hospital Stay (HOSPITAL_COMMUNITY)
Admission: RE | Admit: 2012-11-14 | Payer: Medicare Other | Source: Ambulatory Visit | Admitting: Obstetrics & Gynecology

## 2012-11-14 ENCOUNTER — Inpatient Hospital Stay (HOSPITAL_COMMUNITY): Payer: Medicare Other | Admitting: Anesthesiology

## 2012-11-14 DIAGNOSIS — D500 Iron deficiency anemia secondary to blood loss (chronic): Secondary | ICD-10-CM

## 2012-11-14 DIAGNOSIS — D259 Leiomyoma of uterus, unspecified: Secondary | ICD-10-CM

## 2012-11-14 DIAGNOSIS — N920 Excessive and frequent menstruation with regular cycle: Principal | ICD-10-CM

## 2012-11-14 HISTORY — PX: MYOMECTOMY: SHX85

## 2012-11-14 LAB — CBC
Hemoglobin: 9.3 g/dL — ABNORMAL LOW (ref 12.0–15.0)
MCH: 18.7 pg — ABNORMAL LOW (ref 26.0–34.0)
MCHC: 30 g/dL (ref 30.0–36.0)
MCV: 62.4 fL — ABNORMAL LOW (ref 78.0–100.0)
RBC: 4.97 MIL/uL (ref 3.87–5.11)

## 2012-11-14 LAB — PREPARE RBC (CROSSMATCH)

## 2012-11-14 SURGERY — MYOMECTOMY, ABDOMINAL APPROACH
Anesthesia: General | Site: Abdomen | Wound class: Clean Contaminated

## 2012-11-14 MED ORDER — ZOLPIDEM TARTRATE 5 MG PO TABS
5.0000 mg | ORAL_TABLET | Freq: Every evening | ORAL | Status: DC | PRN
  Administered 2012-11-15 – 2012-11-16 (×2): 5 mg via ORAL
  Filled 2012-11-14 (×2): qty 1

## 2012-11-14 MED ORDER — ONDANSETRON HCL 4 MG/2ML IJ SOLN: 4.0000 mg | Freq: Four times a day (QID) | INTRAMUSCULAR | Status: DC | PRN

## 2012-11-14 MED ORDER — SODIUM CHLORIDE 0.9 % IJ SOLN: 9.0000 mL | INTRAMUSCULAR | Status: DC | PRN

## 2012-11-14 MED ORDER — ONDANSETRON HCL 4 MG/2ML IJ SOLN
INTRAMUSCULAR | Status: DC | PRN
  Administered 2012-11-14: 4 mg via INTRAVENOUS

## 2012-11-14 MED ORDER — KETOROLAC TROMETHAMINE 15 MG/ML IJ SOLN
15.0000 mg | Freq: Four times a day (QID) | INTRAMUSCULAR | Status: DC | PRN
  Administered 2012-11-15 (×2): 15 mg via INTRAVENOUS
  Filled 2012-11-14: qty 1

## 2012-11-14 MED ORDER — DIPHENHYDRAMINE HCL 50 MG/ML IJ SOLN: 12.5000 mg | Freq: Four times a day (QID) | INTRAMUSCULAR | Status: DC | PRN

## 2012-11-14 MED ORDER — VASOPRESSIN 20 UNIT/ML IJ SOLN
INTRAMUSCULAR | Status: AC
  Filled 2012-11-14: qty 1

## 2012-11-14 MED ORDER — SODIUM CHLORIDE 0.9 % IV SOLN
INTRAVENOUS | Status: DC | PRN
  Administered 2012-11-14: 11:00:00 via INTRAVENOUS

## 2012-11-14 MED ORDER — CEFAZOLIN SODIUM-DEXTROSE 2-3 GM-% IV SOLR
INTRAVENOUS | Status: AC
  Administered 2012-11-14: 2 g via INTRAVENOUS
  Filled 2012-11-14: qty 50

## 2012-11-14 MED ORDER — HYDROMORPHONE HCL PF 1 MG/ML IJ SOLN
INTRAMUSCULAR | Status: AC
  Administered 2012-11-14: 0.5 mg via INTRAVENOUS
  Filled 2012-11-14: qty 1

## 2012-11-14 MED ORDER — PROMETHAZINE HCL 25 MG/ML IJ SOLN: 6.2500 mg | INTRAMUSCULAR | Status: DC | PRN

## 2012-11-14 MED ORDER — HYDROMORPHONE HCL PF 1 MG/ML IJ SOLN: 0.2000 mg | INTRAMUSCULAR | Status: DC | PRN

## 2012-11-14 MED ORDER — KETOROLAC TROMETHAMINE 30 MG/ML IJ SOLN: 15.0000 mg | Freq: Once | INTRAMUSCULAR | Status: DC | PRN

## 2012-11-14 MED ORDER — MENTHOL 3 MG MT LOZG: 1.0000 | LOZENGE | OROMUCOSAL | Status: DC | PRN

## 2012-11-14 MED ORDER — MEPERIDINE HCL 25 MG/ML IJ SOLN: 6.2500 mg | INTRAMUSCULAR | Status: DC | PRN

## 2012-11-14 MED ORDER — GLYCOPYRROLATE 0.2 MG/ML IJ SOLN
INTRAMUSCULAR | Status: DC | PRN
  Administered 2012-11-14: 0.6 mg via INTRAVENOUS
  Administered 2012-11-14: 0.2 mg via INTRAVENOUS

## 2012-11-14 MED ORDER — MIDAZOLAM HCL 5 MG/5ML IJ SOLN
INTRAMUSCULAR | Status: DC | PRN
  Administered 2012-11-14: 2 mg via INTRAVENOUS

## 2012-11-14 MED ORDER — DOCUSATE SODIUM 100 MG PO CAPS
100.0000 mg | ORAL_CAPSULE | Freq: Two times a day (BID) | ORAL | Status: DC
  Administered 2012-11-14 – 2012-11-18 (×7): 100 mg via ORAL
  Filled 2012-11-14 (×8): qty 1

## 2012-11-14 MED ORDER — KETOROLAC TROMETHAMINE 30 MG/ML IJ SOLN: 30.0000 mg | Freq: Once | INTRAMUSCULAR | Status: DC

## 2012-11-14 MED ORDER — ALBUTEROL SULFATE (5 MG/ML) 0.5% IN NEBU
2.5000 mg | INHALATION_SOLUTION | Freq: Four times a day (QID) | RESPIRATORY_TRACT | Status: DC | PRN
  Filled 2012-11-14: qty 0.5

## 2012-11-14 MED ORDER — HYDROMORPHONE HCL PF 1 MG/ML IJ SOLN
0.2500 mg | INTRAMUSCULAR | Status: DC | PRN
  Administered 2012-11-14 (×2): 0.5 mg via INTRAVENOUS

## 2012-11-14 MED ORDER — SODIUM CHLORIDE 0.9 % IV SOLN: INTRAVENOUS | Status: DC | PRN

## 2012-11-14 MED ORDER — OXYCODONE-ACETAMINOPHEN 5-325 MG PO TABS
1.0000 | ORAL_TABLET | ORAL | Status: DC | PRN
  Administered 2012-11-15 – 2012-11-18 (×9): 2 via ORAL
  Filled 2012-11-14 (×9): qty 2

## 2012-11-14 MED ORDER — PANTOPRAZOLE SODIUM 40 MG PO TBEC
40.0000 mg | DELAYED_RELEASE_TABLET | Freq: Every day | ORAL | Status: DC
  Administered 2012-11-15 – 2012-11-18 (×4): 40 mg via ORAL
  Filled 2012-11-14 (×6): qty 1

## 2012-11-14 MED ORDER — ROCURONIUM BROMIDE 100 MG/10ML IV SOLN
INTRAVENOUS | Status: DC | PRN
  Administered 2012-11-14: 10 mg via INTRAVENOUS
  Administered 2012-11-14: 45 mg via INTRAVENOUS
  Administered 2012-11-14: 10 mg via INTRAVENOUS
  Administered 2012-11-14: 5 mg via INTRAVENOUS

## 2012-11-14 MED ORDER — KETOROLAC TROMETHAMINE 15 MG/ML IJ SOLN
15.0000 mg | Freq: Once | INTRAMUSCULAR | Status: AC
  Administered 2012-11-14: 15 mg via INTRAVENOUS
  Filled 2012-11-14: qty 1

## 2012-11-14 MED ORDER — NALOXONE HCL 0.4 MG/ML IJ SOLN: 0.4000 mg | INTRAMUSCULAR | Status: DC | PRN

## 2012-11-14 MED ORDER — HYDROMORPHONE 0.3 MG/ML IV SOLN
INTRAVENOUS | Status: DC
  Administered 2012-11-14: 15:00:00 via INTRAVENOUS
  Administered 2012-11-14: 2.1 mg via INTRAVENOUS
  Administered 2012-11-15: 0.9 mg via INTRAVENOUS
  Administered 2012-11-15: 0.3 mg via INTRAVENOUS
  Administered 2012-11-15: 2 mL via INTRAVENOUS
  Filled 2012-11-14: qty 25

## 2012-11-14 MED ORDER — FENTANYL CITRATE 0.05 MG/ML IJ SOLN
INTRAMUSCULAR | Status: DC | PRN
  Administered 2012-11-14: 100 ug via INTRAVENOUS
  Administered 2012-11-14: 150 ug via INTRAVENOUS

## 2012-11-14 MED ORDER — DIPHENHYDRAMINE HCL 12.5 MG/5ML PO ELIX
12.5000 mg | ORAL_SOLUTION | Freq: Four times a day (QID) | ORAL | Status: DC | PRN
  Administered 2012-11-14: 12.5 mg via ORAL
  Filled 2012-11-14: qty 5

## 2012-11-14 MED ORDER — NEOSTIGMINE METHYLSULFATE 1 MG/ML IJ SOLN
INTRAMUSCULAR | Status: DC | PRN
  Administered 2012-11-14: 3 mg via INTRAVENOUS

## 2012-11-14 MED ORDER — MONTELUKAST SODIUM 10 MG PO TABS
10.0000 mg | ORAL_TABLET | Freq: Every day | ORAL | Status: DC
  Administered 2012-11-14 – 2012-11-17 (×4): 10 mg via ORAL
  Filled 2012-11-14 (×5): qty 1

## 2012-11-14 MED ORDER — EPHEDRINE SULFATE 50 MG/ML IJ SOLN
INTRAMUSCULAR | Status: DC | PRN
  Administered 2012-11-14: 10 mg via INTRAVENOUS

## 2012-11-14 MED ORDER — ALBUTEROL SULFATE HFA 108 (90 BASE) MCG/ACT IN AERS
INHALATION_SPRAY | RESPIRATORY_TRACT | Status: DC | PRN
  Administered 2012-11-14: 2 via RESPIRATORY_TRACT

## 2012-11-14 MED ORDER — MIDAZOLAM HCL 2 MG/2ML IJ SOLN
0.5000 mg | Freq: Once | INTRAMUSCULAR | Status: DC | PRN
Start: 2012-11-14 — End: 2012-11-14

## 2012-11-14 MED ORDER — SIMETHICONE 80 MG PO CHEW: 80.0000 mg | CHEWABLE_TABLET | Freq: Four times a day (QID) | ORAL | Status: DC | PRN

## 2012-11-14 MED ORDER — ONDANSETRON HCL 4 MG PO TABS
4.0000 mg | ORAL_TABLET | Freq: Four times a day (QID) | ORAL | Status: DC | PRN
  Administered 2012-11-18: 4 mg via ORAL
  Filled 2012-11-14: qty 1

## 2012-11-14 MED ORDER — IBUPROFEN 600 MG PO TABS
600.0000 mg | ORAL_TABLET | Freq: Four times a day (QID) | ORAL | Status: DC | PRN
  Administered 2012-11-15 – 2012-11-18 (×3): 600 mg via ORAL
  Filled 2012-11-14 (×3): qty 1

## 2012-11-14 MED ORDER — FENTANYL CITRATE 0.05 MG/ML IJ SOLN: 25.0000 ug | INTRAMUSCULAR | Status: DC | PRN

## 2012-11-14 MED ORDER — 0.9 % SODIUM CHLORIDE (POUR BTL) OPTIME
TOPICAL | Status: DC | PRN
  Administered 2012-11-14 (×2): 1000 mL

## 2012-11-14 MED ORDER — PROPOFOL 10 MG/ML IV EMUL
INTRAVENOUS | Status: DC | PRN
  Administered 2012-11-14: 300 mg via INTRAVENOUS

## 2012-11-14 MED ORDER — VASOPRESSIN 20 UNIT/ML IJ SOLN
INTRAVENOUS | Status: DC | PRN
  Administered 2012-11-14: 11:00:00 via INTRAMUSCULAR

## 2012-11-14 MED ORDER — HYDROMORPHONE HCL PF 1 MG/ML IJ SOLN
INTRAMUSCULAR | Status: DC | PRN
  Administered 2012-11-14: 1 mg via INTRAVENOUS

## 2012-11-14 MED ORDER — LACTATED RINGERS IV SOLN
INTRAVENOUS | Status: DC
  Administered 2012-11-14 – 2012-11-15 (×3): via INTRAVENOUS

## 2012-11-14 SURGICAL SUPPLY — 42 items
BARRIER ADHS 3X4 INTERCEED (GAUZE/BANDAGES/DRESSINGS) ×3 IMPLANT
BRR ADH 4X3 ABS CNTRL BYND (GAUZE/BANDAGES/DRESSINGS) ×3
CANISTER SUCTION 2500CC (MISCELLANEOUS) ×2 IMPLANT
CELLS DAT CNTRL 66122 CELL SVR (MISCELLANEOUS) IMPLANT
CHLORAPREP W/TINT 26ML (MISCELLANEOUS) ×2 IMPLANT
CLOTH BEACON ORANGE TIMEOUT ST (SAFETY) ×2 IMPLANT
CONT PATH 16OZ SNAP LID 3702 (MISCELLANEOUS) ×2 IMPLANT
DECANTER SPIKE VIAL GLASS SM (MISCELLANEOUS) ×3 IMPLANT
GLOVE BIO SURGEON STRL SZ7 (GLOVE) ×2 IMPLANT
GLOVE BIOGEL PI IND STRL 7.0 (GLOVE) ×1 IMPLANT
GLOVE BIOGEL PI INDICATOR 7.0 (GLOVE) ×3
GOWN PREVENTION PLUS LG XLONG (DISPOSABLE) ×4 IMPLANT
NDL HYPO 25X1 1.5 SAFETY (NEEDLE) ×1 IMPLANT
NEEDLE HYPO 25X1 1.5 SAFETY (NEEDLE) ×2 IMPLANT
NS IRRIG 1000ML POUR BTL (IV SOLUTION) ×2 IMPLANT
PACK ABDOMINAL GYN (CUSTOM PROCEDURE TRAY) ×2 IMPLANT
PAD OB MATERNITY 4.3X12.25 (PERSONAL CARE ITEMS) ×2 IMPLANT
RETRACTOR WND ALEXIS 18 MED (MISCELLANEOUS) IMPLANT
RETRACTOR WND ALEXIS 25 LRG (MISCELLANEOUS) IMPLANT
RTRCTR WOUND ALEXIS 18CM MED (MISCELLANEOUS)
RTRCTR WOUND ALEXIS 25CM LRG (MISCELLANEOUS)
SPONGE LAP 18X18 X RAY DECT (DISPOSABLE) ×5 IMPLANT
STAPLER VISISTAT 35W (STAPLE) ×2 IMPLANT
SUT PDS AB 0 CT1 27 (SUTURE) IMPLANT
SUT PDS AB 0 CTX 60 (SUTURE) ×2 IMPLANT
SUT SILK 3 0 FS 1X18 (SUTURE) ×1 IMPLANT
SUT VIC AB 0 CT1 18XCR BRD8 (SUTURE) IMPLANT
SUT VIC AB 0 CT1 27 (SUTURE) ×16
SUT VIC AB 0 CT1 27XBRD ANBCTR (SUTURE) ×3 IMPLANT
SUT VIC AB 0 CT1 8-18 (SUTURE) ×2
SUT VIC AB 1 CTX 36 (SUTURE) ×4
SUT VIC AB 1 CTX36XBRD ANBCTRL (SUTURE) ×2 IMPLANT
SUT VIC AB 2-0 CT1 (SUTURE) IMPLANT
SUT VIC AB 2-0 SH 27 (SUTURE) ×4
SUT VIC AB 2-0 SH 27XBRD (SUTURE) IMPLANT
SUT VIC AB 3-0 SH 27 (SUTURE)
SUT VIC AB 3-0 SH 27X BRD (SUTURE) IMPLANT
SUT VICRYL 0 TIES 12 18 (SUTURE) ×1 IMPLANT
SYR CONTROL 10ML LL (SYRINGE) ×2 IMPLANT
TOWEL OR 17X24 6PK STRL BLUE (TOWEL DISPOSABLE) ×4 IMPLANT
TRAY FOLEY CATH 14FR (SET/KITS/TRAYS/PACK) ×2 IMPLANT
WATER STERILE IRR 1000ML POUR (IV SOLUTION) ×1 IMPLANT

## 2012-11-14 NOTE — Progress Notes (Signed)
Dr Macon Large notified of JP drainage of 95 cc in last 30 minutes.  No new orders received as she expected this amount of bleeding post-op.  Does not want pt to receive Toradol- to be d/c'd by Dr Macon Large.

## 2012-11-14 NOTE — Anesthesia Postprocedure Evaluation (Signed)
  Anesthesia Post Note  Patient: [name]  Procedure(s) Performed: Procedure(s) (LRB): MYOMECTOMY (N/A)  Anesthesia type: GA  Patient location: PACU  Post pain: Pain level controlled  Post assessment: Post-op Vital signs reviewed  Last Vitals:  Filed Vitals:   11/14/12 1245  BP: 140/76  Pulse: 84  Temp:   Resp: 24    Post vital signs: Reviewed  Level of consciousness: sedated  Complications: No apparent anesthesia complications

## 2012-11-14 NOTE — Op Note (Signed)
[name] PROCEDURE DATE: 11/14/2012  PREOPERATIVE DIAGNOSES: Symptomatic uterine leiomyomata, menorrhagia, chronic anemia. POSTOPERATIVE DIAGNOSES: The same PROCEDURE: Abdominal Myomectomy SURGEON:  Dr. Jaynie Collins ASSISTANT: Dr. Willodean Rosenthal ANESTHESIOLOGIST: Dr. Brayton Caves  INDICATIONS: 37 y.o. G1P0010 here for abdominal myomectomy for the diagnoses above.  Of note, patient was admitted on the day before surgery to receive a blood transfusion given that her hemoglobin was 6.6.  After transfusion of 4 units of pRBCs, it went up to 9.3.  On the day of surgery, the risks of surgery were discussed with the patient including but not limited to: bleeding which may require transfusion or reoperation and hysterectomy; infection which may require antibiotics; injury to bowel, bladder, ureters or other surrounding organs; need for additional procedures; thromboembolic phenomenon, incisional problems and other postoperative/anesthesia complications.  Also discussed possible need for cesarean deliveries for subsequent pregnancies.  Written informed consent was obtained.    FINDINGS:  22 week sized leiomyomatous uterus with multiple uterine leiomyomata. Thirteen were removed, ranging in size from 1 - 14 cm. Total weight of leiomyomata was 1286.7g.  There were mild adhesions from prior abdominal surgeries, lysed without difficulty.  Interceed was placed over the uterine incisions after the myomectomy.  JP drain placed in the pelvis. PICO disposable negative pressure wound therapy device was placed over the incision at the end of the surgery.  ANESTHESIA: General INTRAVENOUS FLUIDS: 2 units pRBCs, 1800 ml of LR ESTIMATED BLOOD LOSS: 1000 ml URINE OUTPUT: 75 ml SPECIMENS: Leiomyomata sent to pathology DRAINS: JP drain placed intraperitoneally and brought out on the patient's right side; foley catheter in bladder; PICO disposable negative pressure wound therapy device. COMPLICATIONS: None  immediate  PROCEDURE IN DETAIL:  The patient received IV preoperative antibiotics approximately 30 minutes prior to procedure.  She was then taken to the operating room and general anesthesia was administered without difficulty.  .She was placed in dorsal supine position and prepped and draped in a sterile manner.  A Foley catheter was inserted into the bladder and attached to constant drainage.  After an adequate timeout was performed, attention was then turned to the abdomen where a transverse incision was made with a scalpel.  This incision was carried down to the fascia using electrocautery.  The fascia was incised in the midline and this incision was extended bilaterally using the Mayo scissors.  Kocher's were applied to the superior aspect of the fascial incision, and the rectus muscles were dissected off bluntly and sharply using the Mayo scissors.  A similar process was carried out at the inferior aspect of the incision.  The rectus muscles were separated in the midline bluntly and the peritoneum was identified, picked up and incised with the scalpel.  This incision was extended superiorly and inferiorly using the scalpel with good visualization of the bladder and bowel.  Attention was then turned to the uterus where multiple uterine leiomyomata were noted.  The uterus was then delivered up through the incision.  Attention was then turned to the uterine surface where the largest leiomyoma was identified. Vasopressin solution was injected over the surface of the leiomyoma to aid with hemostasis.   A vertical incision was made over the uterine leiomyoma into the leiomyoma, and the capsule was recognized.  Using blunt methods, the leiomyoma was freed from the surrounding myometrial tissue and removed intact.  Other leiomyomata were removed in similar fashion.  After removal of all the leiomyomata which were both on the anterior and posterior aspects of the uterus, the  incisions were closed in layers with 0  Vicryl.  There was a breach into the endometrial cavity and a significant defect created in the myometrium of the upper fundal area.  Patient will be advised that she definitely needs to have cesarean deliveries at [redacted] weeks gestation for any subsequent pregnancies in order to reduce the risk of uterine rupture in pregnancy. The serosa was reapproximated using 0 Vicryl imbricating stitch.  Minimal bleeding was noted from the operative sites.  The uterus was returned to the abdomen and three Interceed sheets was placed over the closed uterine incisions as an adhesive barrier.  The muscles and the peritoneum was closed using 0 Vicryl interrupted stitch.  The fascia was reapproximated with 0 Vicryl running stitch and the subcutaneous layer was reapproximated with 0 Vicryl gut interrupted sutures.  The skin was closed with staples.  After the skin closure, a PICO disposable negative pressure wound therapy device was placed over the incision.  The suction was activated at a pressure of 80 mmHg.  The adhesive was affixed well and there were no leaks noted.   The patient tolerated the procedure well. There were no complications during this case.  Sponge, lap, needle and instrument counts were correct x 2.  The patient was taken to the recovery room extubated and in stable condition.

## 2012-11-14 NOTE — Transfer of Care (Signed)
Immediate Anesthesia Transfer of Care Note  Patient: [name]  Procedure(s) Performed: Procedure(s): MYOMECTOMY (N/A)  Patient Location: PACU  Anesthesia Type:General  Level of Consciousness: awake, alert  and oriented  Airway & Oxygen Therapy: Patient Spontanous Breathing and Patient connected to nasal cannula oxygen  Post-op Assessment: Report given to PACU RN and Post -op Vital signs reviewed and stable  Post vital signs: stable  Complications: No apparent anesthesia complications

## 2012-11-14 NOTE — Progress Notes (Signed)
Ur chart review completed.  

## 2012-11-14 NOTE — Progress Notes (Signed)
Faculty Practice OB/GYN Attending Progress Note/Preoperative Note  Subjective:  Patient had a transfusion of 4 units of pRBCs overnight, Hgb now is 9.3.  She also received respiratory treatments because of nasal congestion and SOB in the setting of known asthma, no current respiratory symptoms.  Objective:  Blood pressure 117/67, pulse 80, temperature 98.8 F (37.1 C), temperature source Oral, resp. rate 19, height 5\' 2"  (1.575 m), weight 250 lb (113.399 kg), SpO2 100.00%. GENERAL: Well-developed, well-nourished female in no acute distress.  ABDOMEN: Soft, obese, nontender, nondistended. No organomegaly.  PELVIC: Deferred  EXTREMITIES: No cyanosis, clubbing, or edema, 2+ distal pulses.   Labs and Imaging  08/18/2012 Pap smear negative, negative HRHPV.   08/18/2012 Endometrium, biopsy: PROLIFERATIVE PHASE ENDOMETRIUM. NEGATIVE FOR HYPERPLASIA OR MALIGNANCY. BENIGN ENDOCERVICAL MUCOSA.   07/25/2012 TRANSABDOMINAL AND TRANSVAGINAL ULTRASOUND OF PELVIS Clinical Data: Pain. Previous ectopic. History of fibroids. Technique: Both transabdominal and transvaginal ultrasound examinations of the pelvis were performed. Transabdominal technique was performed for global imaging of the pelvis including uterus, ovaries, adnexal regions, and pelvic cul-de-sac. It was necessary to proceed with endovaginal exam following the transabdominal exam to visualize the endometrium. Comparison: CT 06/26/2011 Findings: Uterus: 7.4 x 11.2 x 18.9 cm. Multiple fibroids. Largest exophytic from the posterior fundus, 10 x 12.3 x 13.3 cm. There is a 5.9 x 6 x 6.9 cm fibroid in the posterior right uterine body and a 3.3 x 3.5 x 3.7 cm fibroid on the left above the lower uterine segment. Endometrium: 13.2 mm maximum thickness, homogeneous echotexture without focal lesion. Right ovary: Not visualized on transabdominal or pelvic scanning. Left ovary: Not visualized on transabdominal or pelvic scanning. Other findings: No free fluid  IMPRESSION: 1. Large uterine fibroids. 2. Nonvisualization of the ovaries. Pelvic MR may be useful if ovarian pathology is a clinical concern. Original Report Authenticated By: D. Andria Rhein, MD  Assessment & Plan:  37 y.o. G1P0010 scheduled for abdominal myomectomy; had preoperative transfusion overnight for Hgb of 6.6, now is 9.3.   Risks of surgery were discussed with the patient including but not limited to: bleeding which may require further transfusion or reoperation and hysterectomy; infection which may require antibiotics; injury to bowel, bladder, ureters or other surrounding organs; need for additional procedures; thromboembolic phenomenon, incisional problems and other postoperative/anesthesia complications.  Also discussed possible need for cesarean deliveries for subsequent pregnancies.  Written informed consent was obtained.   Patient has been NPO since midnight and she will remain NPO for procedure. Anesthesia and OR aware.  Preoperative prophylactic antibiotics and SCDs ordered on call to the OR.  To OR when ready.   Jaynie Collins, MD, FACOG Attending Obstetrician & Gynecologist Faculty Practice, [facility] [place]

## 2012-11-15 ENCOUNTER — Encounter (HOSPITAL_COMMUNITY): Payer: Self-pay | Admitting: Obstetrics & Gynecology

## 2012-11-15 LAB — CBC
HCT: 24.1 % — ABNORMAL LOW (ref 36.0–46.0)
Hemoglobin: 7.5 g/dL — ABNORMAL LOW (ref 12.0–15.0)
MCH: 20.4 pg — ABNORMAL LOW (ref 26.0–34.0)
MCV: 65.7 fL — ABNORMAL LOW (ref 78.0–100.0)
RBC: 3.67 MIL/uL — ABNORMAL LOW (ref 3.87–5.11)
WBC: 13.2 10*3/uL — ABNORMAL HIGH (ref 4.0–10.5)

## 2012-11-15 LAB — TYPE AND SCREEN
ABO/RH(D): O POS
Antibody Screen: NEGATIVE
Unit division: 0
Unit division: 0
Unit division: 0
Unit division: 0

## 2012-11-15 MED ORDER — FERROUS SULFATE 325 (65 FE) MG PO TABS
325.0000 mg | ORAL_TABLET | Freq: Three times a day (TID) | ORAL | Status: DC
  Administered 2012-11-15 – 2012-11-18 (×7): 325 mg via ORAL
  Filled 2012-11-15 (×7): qty 1

## 2012-11-15 NOTE — Anesthesia Postprocedure Evaluation (Signed)
  Anesthesia Post-op Note  Patient: [name]  Procedure(s) Performed: Procedure(s): MYOMECTOMY (N/A)  Patient Location: Women's Unit  Anesthesia Type:General  Level of Consciousness: awake, alert , oriented and patient cooperative  Airway and Oxygen Therapy: Patient Spontanous Breathing and Patient connected to nasal cannula oxygen  Post-op Pain: severe;RN notified of pts. Need for pain meds.  O2 2L/min with 100% saturation  Post-op Assessment: Patient's Cardiovascular Status Stable, Respiratory Function Stable and Pain level not controlled  Post-op Vital Signs: stable  Complications: No apparent anesthesia complications

## 2012-11-15 NOTE — Progress Notes (Signed)
Patient c/o pain when awaken but has been sleeping quietly between waking up for incentive spirometer and turning.

## 2012-11-15 NOTE — Progress Notes (Addendum)
Patient c/o abdominal pain.  PCA Dilaudid locked out due to decreased respirations 6-7 and CO2 20's.  Patient stated nose was stopped up and unable to breathe, mouth breathing. ETCO2 unable to detect mouth breathing  Dr Shawnie Pons notified, order received and carried.

## 2012-11-15 NOTE — Progress Notes (Signed)
1 Day Post-Op Procedure(s) (LRB): MYOMECTOMY (N/A)  Subjective: Patient reports incisional pain and tolerating sips of clears.  No flatus yet.  Ambulating.    Objective: I have reviewed patient's vital signs, intake and output, medications, labs and pathology.  Temp:  [97.6 F (36.4 C)-98.4 F (36.9 C)] 98.4 F (36.9 C) (05/06 1400) Pulse Rate:  [60-88] 79 (05/06 1400) Resp:  [7-18] 18 (05/06 1400) BP: (111-136)/(71-85) 113/77 mmHg (05/06 1400) SpO2:  [98 %-100 %] 100 % (05/06 1400) Weight:  [250 lb (113.399 kg)] 250 lb (113.399 kg) (05/06 0417) Yesterday's Is and Os: 05/05 0701 - 05/06 0700 In: 6415.1 [P.O.:620; I.V.:5095.1; Blood:700] Out: 3236 [Urine:1575; Drains:661; Blood:1000] This shift: Total I/O In: 120 [P.O.:120] Out: 480 [Urine:450; Drains:30]  General: alert and no distress Resp: clear to auscultation bilaterally Cardio: regular rate and rhythm GI: abnormal findings:  hypoactive bowel sounds and obese and incision: clean, dry, intact and dressing intact. JP drain and PICO negative pressure vacuum dressing are in place. Extremities: extremities normal, atraumatic, no cyanosis or edema, Homans sign is negative, no sign of DVT and SCDs in place Vaginal Bleeding: minimal  Pathology 11/14/12 Uterine fibroid(s) - MULTIPLE LEIOMYOMATA (1,267 GRAMS). - LEIOMYOMATA SPAN UP TO 16.9 CM IN GREATEST DIMENSION. - NO ATYPIA OR MALIGNANCY IDENTIFIED.  Results for orders placed during the hospital encounter of 11/13/12 (from the past 24 hour(s))  CBC     Status: Abnormal   Collection Time    11/15/12  6:15 AM      Result Value Range   WBC 13.2 (*) 4.0 - 10.5 K/uL   RBC 3.67 (*) 3.87 - 5.11 MIL/uL   Hemoglobin 7.5 (*) 12.0 - 15.0 g/dL   HCT 09.8 (*) 11.9 - 14.7 %   MCV 65.7 (*) 78.0 - 100.0 fL   MCH 20.4 (*) 26.0 - 34.0 pg   MCHC 31.1  30.0 - 36.0 g/dL   RDW 82.9 (*) 56.2 - 13.0 %   Platelets 296  150 - 400 K/uL    Assessment: s/p Procedure(s): MYOMECTOMY (N/A):  stable and progressing well  Plan: Advance diet as tolerated Encourage ambulation Advance to PO medication as tolerated Keep JP in place and incisional vacuum dressing in place Routine postoperative care  LOS: 2 days    Fiora Weill A 11/15/2012, 4:25 PM

## 2012-11-15 NOTE — Progress Notes (Signed)
Patient respirations remains 6-12 and CO2 20-30's PCA locked out due to respirations.  Toradol given as ordered

## 2012-11-16 NOTE — Progress Notes (Signed)
Pt called after using the restroom her J>P> drain site was leaking.small amt of bloody liquid noted on dressing. Dr. Despina Hidden was notified of pt.s status. Orders were received and carried.. Pt dressing was changed and dry 4x4 were appliied Pt tolerated procedure well.

## 2012-11-16 NOTE — Progress Notes (Signed)
2 Days Post-Op Procedure(s) (LRB): MYOMECTOMY (N/A)  Subjective: Patient reports mild abdominal pain and tolerating a clear diet.  No flatus yet.  Ambulating.  Pain controlled on on oral pain meds.  Objective: I have reviewed patient's vital signs, intake and output, medications, labs and pathology.  Temp:  [97.5 F (36.4 C)-98.6 F (37 C)] 98.6 F (37 C) (05/07 0533) Pulse Rate:  [79-100] 98 (05/07 0533) Resp:  [14-20] 20 (05/07 0533) BP: (113-123)/(72-86) 113/73 mmHg (05/07 0533) SpO2:  [95 %-100 %] 98 % (05/07 0533) Yesterday's Is and Os: 05/06 0701 - 05/07 0700 In: 920.4 [P.O.:360; I.V.:560.4] Out: 520 [Urine:450; Drains:70]  General: alert and no distress GI: hypoactive bowel sounds and obese and incision: clean, dry, intact and dressing intact. JP drain and PICO negative pressure vacuum dressing are in place. JP dressing has some serosanguinous drainage around it. Extremities: extremities normal, atraumatic, no cyanosis or edema, Homans sign is negative, no sign of DVT  Vaginal Bleeding: minimal  Pathology 11/14/12 Uterine fibroid(s) - MULTIPLE LEIOMYOMATA (1,267 GRAMS). - LEIOMYOMATA SPAN UP TO 16.9 CM IN GREATEST DIMENSION. - NO ATYPIA OR MALIGNANCY IDENTIFIED.  Assessment: s/p Procedure(s): MYOMECTOMY (N/A): stable and progressing well  Plan: Advance diet as tolerated Encourage ambulation Keep JP in place and incisional vacuum dressing in place Routine postoperative care; awaiting flatus/resumption of good bowel function   LOS: 3 days    Cordon Gassett A 11/16/2012, 7:32 AM

## 2012-11-17 MED ORDER — PNEUMOCOCCAL VAC POLYVALENT 25 MCG/0.5ML IJ INJ
0.5000 mL | INJECTION | Freq: Once | INTRAMUSCULAR | Status: AC
  Administered 2012-11-17: 0.5 mL via INTRAMUSCULAR
  Filled 2012-11-17: qty 0.5

## 2012-11-17 NOTE — Progress Notes (Signed)
Patient stated passing flatus since 11/16/12

## 2012-11-17 NOTE — Progress Notes (Signed)
11/17/12 1300  Clinical Encounter Type  Visited With Patient and family together (mom, daughter (55 mos), friend)  Visit Type Spiritual support;Social support  Referral From Chaplain;Nurse  Spiritual Encounters  Spiritual Needs Emotional (pt wants to reconnect with Lulu Riding, LCSW-introduce dtr)   Spiritual Care has missed Ms Windle on previous visit attempts, making this initial visit to offer spiritual and emotional care in the midst of a long stay.  She was quiet and mostly focused on her child, but particularly would like to see Lulu Riding, LCSW to introduce her to her daughter, as she made a meaningful connection with Jill Side when her daughter was born.  Will phone Colleen to request visit.  [address] [place], [place] [phone]

## 2012-11-17 NOTE — Progress Notes (Signed)
3 Days Post-Op Procedure(s) (LRB) MYOMECTOMY (N/[name])  Subjective: Patient reports mild abdominal pain and tolerating [name] regular diet.  No bowel movement.  Ambulating.  Pain controlled on on oral pain meds.  Objective: I have reviewed patient's vital signs, intake and output, medications, labs and pathology. Temp:  [97.6 F (36.4 C)-98.8 F (37.1 C)] 97.6 F (36.4 C) (05/08 1116) Pulse Rate:  [84-103] 84 (05/08 1116) Resp:  [17-20] 20 (05/08 1116) BP: (115-135)/(78-88) 119/81 mmHg (05/08 1116) SpO2:  [97 %-100 %] 97 % (05/08 1116) Yesterday's Is and Os: 05/07 0701 - 05/08 0700 In: -  Out: 45 [Drains:45] This shift Total I/O In: -  Out: 20 [Drains:20]  General: alert and no distress GI: active bowel sounds and obese and incision: clean, dry, intact and dressing intact. JP drain and PICO negative pressure vacuum dressing are in place. JP dressing has some serosanguinous drainage around it. Extremities: extremities normal, atraumatic, no cyanosis or edema, Homans sign is negative, no sign of DVT  Vaginal Bleeding: minimal  Pathology 11/14/12 Uterine fibroid(s) - MULTIPLE LEIOMYOMATA (1,267 GRAMS). - LEIOMYOMATA SPAN UP TO 16.9 CM IN GREATEST DIMENSION. - NO ATYPIA OR MALIGNANCY IDENTIFIED.  Assessment: s/p Procedure(s): MYOMECTOMY (N/[name]): stable and progressing well  Plan: Advance diet as tolerated Encourage ambulation Keep JP in place and incisional vacuum dressing in place Routine postoperative care Discharge tomorrow afternoon   LOS: 4 days    [name],[name] 11/17/2012, 5:02 PM

## 2012-11-18 MED ORDER — DSS 100 MG PO CAPS: 100.0000 mg | ORAL_CAPSULE | Freq: Two times a day (BID) | ORAL | Status: DC | PRN

## 2012-11-18 MED ORDER — OXYCODONE-ACETAMINOPHEN 5-325 MG PO TABS: 1.0000 | ORAL_TABLET | Freq: Four times a day (QID) | ORAL | Status: DC | PRN

## 2012-11-18 MED ORDER — FERROUS SULFATE 325 (65 FE) MG PO TABS: 325.0000 mg | ORAL_TABLET | Freq: Three times a day (TID) | ORAL | Status: DC

## 2012-11-18 MED ORDER — IBUPROFEN 600 MG PO TABS: 600.0000 mg | ORAL_TABLET | Freq: Four times a day (QID) | ORAL | Status: DC | PRN

## 2012-11-18 NOTE — Progress Notes (Signed)
Pt  D/c home  Teaching complete   Ambulated out

## 2012-11-18 NOTE — Discharge Summary (Signed)
Gynecology Physician Discharge Summary  Patient ID: [name] MRN: [id number] DOB/AGE: 38-Jan-1977 38 y.o.  Admit date: 11/13/2012 Discharge date: 11/18/2012  Preoperative Diagnoses: Symptomatic uterine fibroids  Procedures: Procedure(s) (LRB): MYOMECTOMY (N/A)  Significant Diagnostic Studies:  Recent Labs Lab 11/14/12 0600 11/15/12 0615  WBC 9.3 13.2*  HGB 9.3* 7.5*  HCT 31.0* 24.1*  PLT 383 296     Hospital Course:  [name] is a 37 y.o. G1P0010  admitted for scheduled surgery.  She underwent the procedure as mentioned above; for further details about surgery, please refer to the operative report. Patient had an uncomplicated postoperative course. By time of discharge, her pain was controlled on oral pain medications; she was ambulating, voiding without difficulty, tolerating regular diet and passing flatus. JP drain was pulled on the day of discharge.  Patient went home with the PICO incision wound negative pressure dressing, will return on POD#7 (11/21/12) for removal and incisional staple removal.  She was deemed stable for discharge to home.   Discharge Exam: Blood pressure 116/66, pulse 85, temperature 97.6 F (36.4 C), temperature source Oral, resp. rate 18, height 5\' 2"  (1.575 m), weight 250 lb (113.399 kg), SpO2 100.00%. General appearance: alert and no distress GI: soft, obese, non-tender, bowel sounds normal, no masses,  no organomegaly.  PICO incisional wound dressing in place. Pelvic: deferred Extremities: extremities normal, atraumatic, no cyanosis or edema and Homans sign is negative, no sign of DVT  Discharged Condition: Stable  Disposition: 01-Home or Self Care     Medication List    STOP taking these medications       ipratropium-albuterol 0.5-2.5 (3) MG/3ML Soln  Commonly known as:  DUONEB      TAKE these medications       albuterol 108 (90 BASE) MCG/ACT inhaler  Commonly known as:  PROVENTIL HFA;VENTOLIN HFA  Inhale 2 puffs into the lungs  every 6 (six) hours as needed. For wheezing     albuterol (2.5 MG/3ML) 0.083% nebulizer solution  Commonly known as:  PROVENTIL  Take 2.5 mg by nebulization every 6 (six) hours as needed. For wheezing     budesonide-formoterol 160-4.5 MCG/ACT inhaler  Commonly known as:  SYMBICORT  Inhale 2 puffs into the lungs 2 (two) times daily.     DSS 100 MG Caps  Take 100 mg by mouth 2 (two) times daily as needed for constipation.     ferrous sulfate 325 (65 FE) MG tablet  Take 1 tablet (325 mg total) by mouth 3 (three) times daily with meals.     ibuprofen 600 MG tablet  Commonly known as:  ADVIL,MOTRIN  Take 1 tablet (600 mg total) by mouth every 6 (six) hours as needed for pain (mild pain).     medroxyPROGESTERone 10 MG tablet  Commonly known as:  PROVERA  Take 2 tablets (20 mg total) by mouth daily.     montelukast 10 MG tablet  Commonly known as:  SINGULAIR  Take 10 mg by mouth at bedtime.     naproxen 500 MG tablet  Commonly known as:  NAPROSYN  Take 1 tablet (500 mg total) by mouth 2 (two) times daily with a meal.     oxyCODONE-acetaminophen 5-325 MG per tablet  Commonly known as:  PERCOCET/ROXICET  Take 1 tablet by mouth every 6 (six) hours as needed for pain.     oxyCODONE-acetaminophen 5-325 MG per tablet  Commonly known as:  PERCOCET/ROXICET  Take 1-2 tablets by mouth every 6 (six) hours as  needed for pain.     ranitidine 150 MG capsule  Commonly known as:  ZANTAC  Take 150 mg by mouth 2 (two) times daily as needed for heartburn.       Follow-up Information   Follow up with Tereso Newcomer, MD On 11/21/2012. (1 pm for wound vacuum removal and staple removal.  Call clinic or come to MAU for any concerns.)    Contact information:   [facility] [address] [place] [address] [phone]       Signed:  Jaynie Collins, MD, FACOG Attending Obstetrician & Gynecologist Faculty Practice, [facility] [place]

## 2012-11-18 NOTE — Plan of Care (Signed)
Problem: Discharge Progression Outcomes Goal: Discontinue staples (if applicable) Outcome: Progressing Pt will have  Removed  In office    And wound vac   Also

## 2012-11-21 ENCOUNTER — Ambulatory Visit (INDEPENDENT_AMBULATORY_CARE_PROVIDER_SITE_OTHER): Payer: Medicare Other | Admitting: Obstetrics & Gynecology

## 2012-11-21 ENCOUNTER — Encounter: Payer: Self-pay | Admitting: Obstetrics & Gynecology

## 2012-11-21 VITALS — BP 154/92 | HR 96 | Ht 62.0 in | Wt 253.6 lb

## 2012-11-21 DIAGNOSIS — Z09 Encounter for follow-up examination after completed treatment for conditions other than malignant neoplasm: Secondary | ICD-10-CM

## 2012-11-21 NOTE — Progress Notes (Signed)
GYNECOLOGY CLINIC PROGRESS NOTE  History:  37 y.o. G1P0010 here today for incisional staple removal and PICO negative pressure dressing removal.  She underwent abdominal myomectomy on 11/14/12.  No postoperative concerns since discharge.  The following portions of the patient's history were reviewed and updated as appropriate: allergies, current medications, past family history, past medical history, past social history, past surgical history and problem list.  Review of Systems:  Pertinent items are noted in HPI.  Objective:  Physical Exam BP 154/92  Pulse 96  Ht 5\' 2"  (1.575 m)  Wt 253 lb 9.6 oz (115.032 kg)  BMI 46.37 kg/m2 Gen: NAD Abd: Soft, obese, nontender and nondistended Incision: PICO dressing removed.  Incision clean, dry, intact with staples in place.  Staples removed.  Superficial dehiscence noted in mid portion of incision as the inferior edge was overhanging. Reapproximated using steristrips and benzoin.    Assessment & Plan:  Patient is here s/p incisional staple removal and PICO removal No complaints Follow up in 4 weeks for postoperative check or earlier if needed

## 2012-11-21 NOTE — Patient Instructions (Signed)
Return to clinic for any scheduled appointments or for any gynecologic concerns as needed.   

## 2012-11-23 ENCOUNTER — Encounter (HOSPITAL_COMMUNITY): Payer: Self-pay | Admitting: *Deleted

## 2012-11-23 ENCOUNTER — Inpatient Hospital Stay (HOSPITAL_COMMUNITY)
Admission: AD | Admit: 2012-11-23 | Discharge: 2012-11-23 | Disposition: A | Discharge status: Home / Self Care | Payer: Medicare Other | Source: Ambulatory Visit | Attending: Obstetrics and Gynecology | Admitting: Obstetrics and Gynecology

## 2012-11-23 DIAGNOSIS — Y838 Other surgical procedures as the cause of abnormal reaction of the patient, or of later complication, without mention of misadventure at the time of the procedure: Secondary | ICD-10-CM | POA: Insufficient documentation

## 2012-11-23 DIAGNOSIS — T8131XA Disruption of external operation (surgical) wound, not elsewhere classified, initial encounter: Secondary | ICD-10-CM | POA: Insufficient documentation

## 2012-11-23 DIAGNOSIS — R209 Unspecified disturbances of skin sensation: Secondary | ICD-10-CM

## 2012-11-23 DIAGNOSIS — L7682 Other postprocedural complications of skin and subcutaneous tissue: Secondary | ICD-10-CM

## 2012-11-23 NOTE — MAU Provider Note (Signed)
Attestation of Attending Supervision of Advanced Practitioner (CNM/NP)/Fellow: Evaluation and management procedures were performed by the Advanced Practitioner under my supervision and collaboration.  I have reviewed the Advanced Practitioner's note and chart, and I agree with the management and plan.  HARRAWAY-SMITH, Jonie Burdell 10:06 PM

## 2012-11-23 NOTE — Progress Notes (Signed)
Abdominal incision intact, steri strips remain on R half of incision.  No redness or drainage noted.  Edges not completely approximated, some pink tissue noted, no dehiscence present.

## 2012-11-23 NOTE — MAU Note (Signed)
Had myomectomy on Mon, dc'd on Fri.   Took a shower today, mom looked at incision, thought it looked open.  A little stingy, but not bad.  Some of the steri strips are off.  No drainage noted.

## 2012-11-23 NOTE — MAU Provider Note (Signed)
History     CSN: [id number]  Arrival date and time: 11/23/12 1608   None     Chief Complaint  Patient presents with  . Post-op Problem   HPI 37 y.o. female s/p abdominal myomectomy on 5/5 (POD #9) with wound separation. Seen in clinic 2 days ago for post-op check and PICO wound vac removed. The patient had some superficial dehiscence of mid-part of wound at that time with inferior edge overhanging superior edge. Steristrips were applied. Patient states she took a shower today and her mom noticed that the whole incision looked like it was coming apart. She reports some stinging along incision but no leaking/drainage. No fever, chills, nausea or vomiting.   Past Medical History  Diagnosis Date  . Asthma   . Environmental allergies   . Fibroids   . Shortness of breath   . GERD (gastroesophageal reflux disease)   . Anemia   . Fibroid, uterine   . Menorrhagia   . Blood transfusion without reported diagnosis     Past Surgical History  Procedure Laterality Date  . Ectopic pregnancy surgery    . Myomectomy    . Cholecystectomy      06/29/2011  . Cholecystectomy  06/27/2011    Procedure: LAPAROSCOPIC CHOLECYSTECTOMY WITH INTRAOPERATIVE CHOLANGIOGRAM;  Surgeon: Atilano Ina, MD;  Location: [facility] OR;  Service: General;  Laterality: N/A;  . Cholecystectomy  06/29/2011    Procedure: LAPAROSCOPIC CHOLECYSTECTOMY WITH INTRAOPERATIVE CHOLANGIOGRAM;  Surgeon: Robyne Askew, MD;  Location: [facility] OR;  Service: General;  Laterality: N/A;  . Ercp  06/30/2011    Procedure: ENDOSCOPIC RETROGRADE CHOLANGIOPANCREATOGRAPHY (ERCP);  Surgeon: Petra Kuba, MD;  Location: [facility] ENDOSCOPY;  Service: Endoscopy;  Laterality: N/A;  probable sphincterotomy  . Ercp  07/03/2011    Procedure: ENDOSCOPIC RETROGRADE CHOLANGIOPANCREATOGRAPHY (ERCP);  Surgeon: Freddy Jaksch, MD;  Location: [facility] OR;  Service: Endoscopy;  Laterality: N/A;  PROPOFOL  . Myomectomy N/A 11/14/2012    Procedure: MYOMECTOMY;  Surgeon: Tereso Newcomer, MD;  Location: [facility] ORS;  Service: Gynecology;  Laterality: N/A;    Family History  Problem Relation Age of Onset  . Anesthesia problems Neg Hx   . Hypotension Neg Hx   . Malignant hyperthermia Neg Hx   . Pseudochol deficiency Neg Hx   . Hypertension Mother   . Diabetes Mother   . Hypertension Father   . Hypertension Brother     History  Substance Use Topics  . Smoking status: Never Smoker   . Smokeless tobacco: Never Used  . Alcohol Use: No    Allergies:  Allergies  Allergen Reactions  . Bee Venom Anaphylaxis    Patient has an epi pen, also has not been stung before but was informed she is allergic  . Prednisone Hives    Prescriptions prior to admission  Medication Sig Dispense Refill  . albuterol (PROVENTIL HFA;VENTOLIN HFA) 108 (90 BASE) MCG/ACT inhaler Inhale 2 puffs into the lungs every 6 (six) hours as needed. For wheezing      . albuterol (PROVENTIL) (2.5 MG/3ML) 0.083% nebulizer solution Take 2.5 mg by nebulization every 6 (six) hours as needed. For wheezing      . budesonide-formoterol (SYMBICORT) 160-4.5 MCG/ACT inhaler Inhale 2 puffs into the lungs 2 (two) times daily.        Marland Kitchen docusate sodium 100 MG CAPS Take 100 mg by mouth 2 (two) times daily as needed for constipation.  30 capsule  3  . ferrous sulfate 325 (65 FE)  MG tablet Take 1 tablet (325 mg total) by mouth 3 (three) times daily with meals.  90 tablet  3  . ibuprofen (ADVIL,MOTRIN) 600 MG tablet Take 1 tablet (600 mg total) by mouth every 6 (six) hours as needed for pain (mild pain).  60 tablet  3  . montelukast (SINGULAIR) 10 MG tablet Take 10 mg by mouth at bedtime.        . naproxen (NAPROSYN) 500 MG tablet Take 1 tablet (500 mg total) by mouth 2 (two) times daily with a meal.  60 tablet  2  . oxyCODONE-acetaminophen (PERCOCET/ROXICET) 5-325 MG per tablet Take 1-2 tablets by mouth every 6 (six) hours as needed for pain.  60 tablet  0  . ranitidine (ZANTAC) 150 MG capsule Take 150 mg by mouth 2  (two) times daily as needed for heartburn.         ROS Pertinent pos and neg mentioned in HPI  Physical Exam   Blood pressure 140/84, pulse 84, temperature 98.3 F (36.8 C), temperature source Oral, resp. rate 20.  Physical Exam GEN:  WNWD, no distress HEENT:  NCAT, EOMI, conjunctiva clear NECK:  Supple, non-tender, no thyromegaly, trachea midline ABD:  Obese, Soft, mild tenderness around incisino. no guarding or rebound INCISION:  Inferior lip of incision is overhanging across most of incision. Steristrips in place on right 1/3 of incision. Inferior lip is pink, healthy looking tissue. No redness of skin surrounding incision. No drainage or odor. Probe along length of incision reveals no opening or undermining of suture line. EXTREM:  Warm, well perfused, no edema or tenderness   MAU Course  Procedures   Assessment and Plan  37 y.o. female POD #9 after abdominal myomectomy with - Superficial incisional dehiscence - overhanging inferior lip of incision - Keep dry/covered with sterile sanitary pad or gauze. Discussed signs/symptoms of wound infection and dehiscence.  - Pt needs post-op appt in 4-5 weeks - has not scheduled yet (seen 5/12 for wound vac removal). Note sent to clinic to schedule appt. - Stable for discharge home.  Napoleon Form 11/23/2012, 5:41 PM

## 2012-11-24 ENCOUNTER — Encounter (HOSPITAL_COMMUNITY): Payer: Self-pay | Admitting: *Deleted

## 2012-11-24 ENCOUNTER — Telehealth: Payer: Self-pay

## 2012-11-24 ENCOUNTER — Emergency Department (HOSPITAL_COMMUNITY)
Admission: EM | Admit: 2012-11-24 | Discharge: 2012-11-24 | Disposition: A | Discharge status: Home / Self Care | Payer: Medicare Other | Attending: Emergency Medicine | Admitting: Emergency Medicine

## 2012-11-24 DIAGNOSIS — Z8742 Personal history of other diseases of the female genital tract: Secondary | ICD-10-CM | POA: Insufficient documentation

## 2012-11-24 DIAGNOSIS — J45909 Unspecified asthma, uncomplicated: Secondary | ICD-10-CM | POA: Insufficient documentation

## 2012-11-24 DIAGNOSIS — T8131XA Disruption of external operation (surgical) wound, not elsewhere classified, initial encounter: Secondary | ICD-10-CM | POA: Insufficient documentation

## 2012-11-24 DIAGNOSIS — D649 Anemia, unspecified: Secondary | ICD-10-CM | POA: Insufficient documentation

## 2012-11-24 DIAGNOSIS — K219 Gastro-esophageal reflux disease without esophagitis: Secondary | ICD-10-CM | POA: Insufficient documentation

## 2012-11-24 DIAGNOSIS — Y836 Removal of other organ (partial) (total) as the cause of abnormal reaction of the patient, or of later complication, without mention of misadventure at the time of the procedure: Secondary | ICD-10-CM | POA: Insufficient documentation

## 2012-11-24 DIAGNOSIS — Z79899 Other long term (current) drug therapy: Secondary | ICD-10-CM | POA: Insufficient documentation

## 2012-11-24 DIAGNOSIS — Z8709 Personal history of other diseases of the respiratory system: Secondary | ICD-10-CM | POA: Insufficient documentation

## 2012-11-24 NOTE — ED Provider Notes (Signed)
History    37 year old female presenting for evaluation of her surgical wound. Patient is status post myomectomy done on May 5. She reports that she has noticed some mild drainage from the incision over the past couple days. She describes serosanguineous drainage on the bandage placed over it. Minimal localized pain. States that she has not even taken any pain medicines for the last 2 days. No fevers or chills. No other complaints at this time.  CSN: [id number]  Arrival date & time 11/24/12  2041   First MD Initiated Contact with Patient 11/24/12 2213      Chief Complaint  Patient presents with  . Post-op Problem    (Consider location/radiation/quality/duration/timing/severity/associated sxs/prior treatment) HPI  Past Medical History  Diagnosis Date  . Asthma   . Environmental allergies   . Fibroids   . Shortness of breath   . GERD (gastroesophageal reflux disease)   . Anemia   . Fibroid, uterine   . Menorrhagia   . Blood transfusion without reported diagnosis     Past Surgical History  Procedure Laterality Date  . Ectopic pregnancy surgery    . Myomectomy    . Cholecystectomy      06/29/2011  . Cholecystectomy  06/27/2011    Procedure: LAPAROSCOPIC CHOLECYSTECTOMY WITH INTRAOPERATIVE CHOLANGIOGRAM;  Surgeon: Atilano Ina, MD;  Location: [facility] OR;  Service: General;  Laterality: N/A;  . Cholecystectomy  06/29/2011    Procedure: LAPAROSCOPIC CHOLECYSTECTOMY WITH INTRAOPERATIVE CHOLANGIOGRAM;  Surgeon: Robyne Askew, MD;  Location: [facility] OR;  Service: General;  Laterality: N/A;  . Ercp  06/30/2011    Procedure: ENDOSCOPIC RETROGRADE CHOLANGIOPANCREATOGRAPHY (ERCP);  Surgeon: Petra Kuba, MD;  Location: [facility] ENDOSCOPY;  Service: Endoscopy;  Laterality: N/A;  probable sphincterotomy  . Ercp  07/03/2011    Procedure: ENDOSCOPIC RETROGRADE CHOLANGIOPANCREATOGRAPHY (ERCP);  Surgeon: Freddy Jaksch, MD;  Location: [facility] OR;  Service: Endoscopy;  Laterality: N/A;  PROPOFOL  .  Myomectomy N/A 11/14/2012    Procedure: MYOMECTOMY;  Surgeon: Tereso Newcomer, MD;  Location: [facility] ORS;  Service: Gynecology;  Laterality: N/A;    Family History  Problem Relation Age of Onset  . Anesthesia problems Neg Hx   . Hypotension Neg Hx   . Malignant hyperthermia Neg Hx   . Pseudochol deficiency Neg Hx   . Hypertension Mother   . Diabetes Mother   . Hypertension Father   . Hypertension Brother     History  Substance Use Topics  . Smoking status: Never Smoker   . Smokeless tobacco: Never Used  . Alcohol Use: No    OB History   Grav Para Term Preterm Abortions TAB SAB Ect Mult Living   1    1   1         Review of Systems  All systems reviewed and negative, other than as noted in HPI.   Allergies  Bee venom and Prednisone  Home Medications   Current Outpatient Rx  Name  Route  Sig  Dispense  Refill  . albuterol (PROVENTIL HFA;VENTOLIN HFA) 108 (90 BASE) MCG/ACT inhaler   Inhalation   Inhale 2 puffs into the lungs every 6 (six) hours as needed. For wheezing         . albuterol (PROVENTIL) (2.5 MG/3ML) 0.083% nebulizer solution   Nebulization   Take 2.5 mg by nebulization every 6 (six) hours as needed. For wheezing         . budesonide-formoterol (SYMBICORT) 160-4.5 MCG/ACT inhaler   Inhalation   Inhale  2 puffs into the lungs 2 (two) times daily.           Marland Kitchen docusate sodium 100 MG CAPS   Oral   Take 100 mg by mouth 2 (two) times daily as needed for constipation.   30 capsule   3   . ferrous sulfate 325 (65 FE) MG tablet   Oral   Take 1 tablet (325 mg total) by mouth 3 (three) times daily with meals.   90 tablet   3   . ibuprofen (ADVIL,MOTRIN) 600 MG tablet   Oral   Take 1 tablet (600 mg total) by mouth every 6 (six) hours as needed for pain (mild pain).   60 tablet   3   . montelukast (SINGULAIR) 10 MG tablet   Oral   Take 10 mg by mouth at bedtime.           . naproxen (NAPROSYN) 500 MG tablet   Oral   Take 1 tablet (500 mg  total) by mouth 2 (two) times daily with a meal.   60 tablet   2   . oxyCODONE-acetaminophen (PERCOCET/ROXICET) 5-325 MG per tablet   Oral   Take 1-2 tablets by mouth every 6 (six) hours as needed for pain.   60 tablet   0   . ranitidine (ZANTAC) 150 MG capsule   Oral   Take 150 mg by mouth 2 (two) times daily as needed for heartburn.            BP 129/84  Pulse 94  Temp(Src) 98.2 F (36.8 C) (Oral)  Resp 18  Ht 5\' 2"  (1.575 m)  Wt 253 lb (114.76 kg)  BMI 46.26 kg/m2  SpO2 98%  Physical Exam  Nursing note and vitals reviewed. Constitutional: She appears well-developed and well-nourished. No distress.  HENT:  Head: Normocephalic and atraumatic.  Eyes: Conjunctivae are normal. Right eye exhibits no discharge. Left eye exhibits no discharge.  Neck: Neck supple.  Cardiovascular: Normal rate, regular rhythm and normal heart sounds.  Exam reveals no gallop and no friction rub.   No murmur heard. Pulmonary/Chest: Effort normal and breath sounds normal. No respiratory distress.  Abdominal: Soft. She exhibits no distension. There is no tenderness.  Transverse incision across lower abdomen. Some skin dehiscence to the left lateral aspect. Small amount of subcutaneous tissue exposed. No drainage. No surrounding cellulitis. Mild incisional tenderness which seems appropriate for timing of her procedure.  Musculoskeletal: She exhibits no edema and no tenderness.  Neurological: She is alert.  Skin: Skin is warm and dry.  Psychiatric: She has a normal mood and affect. Her behavior is normal. Thought content normal.    ED Course  Procedures (including critical care time)  Labs Reviewed - No data to display No results found.   1. Dehiscence of closure of skin, initial encounter       MDM  37 year old female with some skin dehiscence of her surgical incision. Margins reinforced with Steri-Strips. Additional wound care was discussed. Outpatient followup with her surgeon  otherwise.        Raeford Razor, MD 11/24/12 2257

## 2012-11-24 NOTE — ED Notes (Signed)
Incisions on lower abd coming apart.  No bleeding and drainage noted at this time.

## 2012-11-24 NOTE — ED Notes (Signed)
Pt states that she had a myomectomy on 5/5; staples were removed this Monday; pt noted some open areas to incision line and was seen at [facility]; pt states that they said it was fine and sent her home; pt states that she has had some bloody drainage from area today and called MD and they advised to place a pad there; pt requests to be evaluated over concern of open areas to incision.

## 2012-11-24 NOTE — Telephone Encounter (Signed)
Pt called and stated "could someone please call back it's pertaining my surgery that I had last Monday".

## 2012-11-25 NOTE — Telephone Encounter (Addendum)
Noticed patient went to ER last night for concerns. Patient did not call this morning, charted in error

## 2012-11-30 ENCOUNTER — Ambulatory Visit (INDEPENDENT_AMBULATORY_CARE_PROVIDER_SITE_OTHER): Payer: Medicare Other | Admitting: Obstetrics & Gynecology

## 2012-11-30 ENCOUNTER — Encounter: Payer: Self-pay | Admitting: Obstetrics & Gynecology

## 2012-11-30 VITALS — BP 132/85 | HR 91 | Temp 97.5°F | Resp 20 | Ht 62.0 in | Wt 246.6 lb

## 2012-11-30 DIAGNOSIS — Z5189 Encounter for other specified aftercare: Secondary | ICD-10-CM

## 2012-11-30 DIAGNOSIS — Z09 Encounter for follow-up examination after completed treatment for conditions other than malignant neoplasm: Secondary | ICD-10-CM

## 2012-11-30 NOTE — Progress Notes (Signed)
Subjective:     Patient ID: [name], female   DOB: October 28, 1975, 37 y.o.   MRN: [id number]  HPI Pt s/p myomectomy on 11/14/2012.  Had a wound vac placed post surgery which was removed. Pt has been seen in the MAU and in the [facility] ED since that time.  She reports that she was told that the wound is 'open' and she is worried.  She c/o 'green and bloody' drainage.  Pt denies f/v/abd pain.  She reports that she has been old by 3 different providers that her wound was infected.    Review of Systems     Objective:   Physical Exam BP 132/85  Pulse 91  Temp(Src) 97.5 F (36.4 C) (Oral)  Resp 20  Ht 5\' 2"  (1.575 m)  Wt 246 lb 9.6 oz (111.857 kg)  BMI 45.09 kg/m2 Abd: incision clean and dry.  There was 1 guaze covering the incision.  There is no erythema.  There is an area of ~4cm of a less than 5mm separation in the midline.     Assessment:     Wound separation- no evidence of infection      Plan:     Keep incision clean and dry Pt educated about the ongoing care of the wound Reassured regarding the appropriate healing of this wound

## 2012-11-30 NOTE — Patient Instructions (Signed)
Myomectomy Care After  Refer to this sheet in the next few weeks. These instructions provide you with information on caring for yourself after your procedure. Your caregiver may also give you specific instructions. Your treatment has been planned according to current medical practices, but problems sometimes occur. Call your caregiver if you have any problems or questions after your procedure. HOME CARE INSTRUCTIONS   Only take over-the-counter or prescription medicines for pain, discomfort, or fever as directed by your caregiver. Avoid aspirin because it can cause bleeding.  Do not douche, use tampons, or have intercourse until given permission to by your caregiver.  Change your bandage (dressing) as directed.  Do not drive until you are given permission to by your caregiver.  Take showers instead of baths as directed by your caregiver.  If you become constipated, you may take a mild laxative with your caregiver's permission. Eat more bran and drink enough fluids to keep your urine clear or pale yellow.  Take your temperature twice a day and write it down.  Do not drink alcohol.  Do not drive while on pain medicine (narcotics).  Have help at home for 1 week, or until you can do your own household activities.  Keep all follow-up appointments with your caregiver. SEEK MEDICAL CARE IF:  You develop a temperature of 100 F (37.8 C) or higher.  You have increasing stomach pain and medicine does not help.  You have nausea, vomiting, or diarrhea.  You have pain when you urinate, or you have blood in your urine.  You have a rash on your body.  You have pain or redness where your intravenous (IV) access tube was inserted.  You develop weakness or lightheadedness.  You need stronger pain medicine.  You have a reaction or side effects from your medicines. SEEK IMMEDIATE MEDICAL CARE IF:   You have pain, swelling, or any kind of drainage from your incision.  You have pain,  swelling, or redness in your leg.  You have chest pain.  You faint.  You have shortness of breath.  You have heavy vaginal bleeding.  You see pus coming from the incision.  Your incision is opening up. MAKE SURE YOU:  Understand these instructions.  Will watch your condition.  Will get help right away if you are not doing well or get worse. Document Released: 11/19/2010 Document Revised: 09/21/2011 Document Reviewed: 11/19/2010 [employer] Patient Information 2014 [employer].  

## 2012-11-30 NOTE — Progress Notes (Signed)
S/P myomectomy on 11/14/12.  Pt started noticing opening of her incision and drainage on 5/14 and went to MAU for evaluation. She then went to [facility] ED on 5/15 and steristrips were placed, however pt states they keep falling off and the incision is still open, draining greenish, bloody fluid.

## 2012-11-30 NOTE — Progress Notes (Deleted)
Subjective:     Patient ID: [name], female   DOB: 10/09/1975, 36 y.o.   MRN: [id number]  HPI Pt s/p myomectomy on 11/14/2012.  Had a wound vac placed post surgery which was removed. Pt has been seen in the MAU and in the [facility] ED since that time.  She reports that she was told that the wound is 'open' and she is worried.  She c/o 'green and bloody' drainage.  Pt denies f/v/abd pain.  She reports that she has been old by 3 different providers that her wound was infected.    Review of Systems     Objective:   Physical Exam BP 132/85  Pulse 91  Temp(Src) 97.5 F (36.4 C) (Oral)  Resp 20  Ht 5' 2" (1.575 m)  Wt 246 lb 9.6 oz (111.857 kg)  BMI 45.09 kg/m2 Abd: incision clean and dry.  There was 1 guaze covering the incision.  There is no erythema.  There is an area of ~4cm of a less than 5mm separation in the midline.     Assessment:     Wound separation- no evidence of infection      Plan:     Keep incision clean and dry Pt educated about the ongoing care of the wound Reassured regarding the appropriate healing of this wound      

## 2012-12-22 ENCOUNTER — Encounter: Payer: Self-pay | Admitting: Obstetrics & Gynecology

## 2012-12-22 ENCOUNTER — Ambulatory Visit (INDEPENDENT_AMBULATORY_CARE_PROVIDER_SITE_OTHER): Payer: Medicare Other | Admitting: Obstetrics & Gynecology

## 2012-12-22 VITALS — BP 129/95 | HR 84 | Ht 62.0 in | Wt 252.1 lb

## 2012-12-22 DIAGNOSIS — D219 Benign neoplasm of connective and other soft tissue, unspecified: Secondary | ICD-10-CM

## 2012-12-22 DIAGNOSIS — D259 Leiomyoma of uterus, unspecified: Secondary | ICD-10-CM

## 2012-12-22 DIAGNOSIS — Z09 Encounter for follow-up examination after completed treatment for conditions other than malignant neoplasm: Secondary | ICD-10-CM

## 2012-12-22 MED ORDER — FLUCONAZOLE 150 MG PO TABS: 150.0000 mg | ORAL_TABLET | Freq: Once | ORAL | Status: DC

## 2012-12-22 NOTE — Progress Notes (Signed)
GYNECOLOGY CLINIC PROGRESS NOTE  History:  37 y.o. G1P0010 here today for postoperative check after abdominal myomectomy on 11/14/12.  She had benign pathology showing fibroids.  She does not have any bleeding or pain currently. No problems with eating; no bladder or bowel concerns. Not sexually active.  The following portions of the patient's history were reviewed and updated as appropriate: allergies, current medications, past family history, past medical history, past social history, past surgical history and problem list. Last ppap in 08/2012 was negative cytology, negative HRHPV.  Review of Systems:  Pertinent items are noted in HPI.  Objective:  Physical Exam BP 129/95  Pulse 84  Ht 5\' 2"  (1.575 m)  Wt 252 lb 1.6 oz (114.352 kg)  BMI 46.1 kg/m2 Gen: NAD Abd: Soft, obese nontender and nondistended. Low transverse incision is healing well. 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 - LEIOMYOMATA SPAN UP TO 16.9 CM IN GREATEST DIMENSION. - NO ATYPIA OR MALIGNANCY IDENTIFIED.  Assessment & Plan:  Stable postoperative check Follow up as needed or for routine GYN care Routine preventative health maintenance measures emphasized

## 2012-12-22 NOTE — Patient Instructions (Signed)
Return to clinic for any scheduled appointments or for any gynecologic concerns as needed.   

## 2013-03-27 ENCOUNTER — Emergency Department (INDEPENDENT_AMBULATORY_CARE_PROVIDER_SITE_OTHER)
Admission: EM | Admit: 2013-03-27 | Discharge: 2013-03-27 | Disposition: A | Discharge status: Home / Self Care | Payer: Medicare Other | Source: Home / Self Care | Attending: Family Medicine | Admitting: Family Medicine

## 2013-03-27 ENCOUNTER — Encounter (HOSPITAL_COMMUNITY): Payer: Self-pay | Admitting: Emergency Medicine

## 2013-03-27 DIAGNOSIS — H9209 Otalgia, unspecified ear: Secondary | ICD-10-CM

## 2013-03-27 DIAGNOSIS — G5602 Carpal tunnel syndrome, left upper limb: Secondary | ICD-10-CM

## 2013-03-27 DIAGNOSIS — H9201 Otalgia, right ear: Secondary | ICD-10-CM

## 2013-03-27 DIAGNOSIS — G5600 Carpal tunnel syndrome, unspecified upper limb: Secondary | ICD-10-CM

## 2013-03-27 MED ORDER — AMOXICILLIN 500 MG PO CAPS: 500.0000 mg | ORAL_CAPSULE | Freq: Three times a day (TID) | ORAL | Status: DC

## 2013-03-27 MED ORDER — METHYLPREDNISOLONE (PAK) 4 MG PO TABS: ORAL_TABLET | ORAL | Status: DC

## 2013-03-27 NOTE — ED Provider Notes (Signed)
[name] is a 37 y.o. female who presents to Urgent Care today for right ear pain present for one week. Patient also notes nasal congestion cough. She denies any shortness of breath or chest pain. Her pain is present with mouth motion. The pain radiates to her jaw. She denies any tooth pain. She denies any nausea vomiting or diarrhea .   She also notes left hand tingling. She describes tingling on the palmar aspect of the first 3 digits. This is been present for several weeks. This is worse with activity better with rest. She denies any neck pain or weakness   Past Medical History  Diagnosis Date  . Asthma   . Environmental allergies   . Fibroids   . Shortness of breath   . GERD (gastroesophageal reflux disease)   . Anemia   . Fibroid, uterine   . Menorrhagia   . Blood transfusion without reported diagnosis    History  Substance Use Topics  . Smoking status: Never Smoker   . Smokeless tobacco: Never Used  . Alcohol Use: No   ROS as above Medications reviewed. No current facility-administered medications for this encounter.   Current Outpatient Prescriptions  Medication Sig Dispense Refill  . albuterol (PROVENTIL HFA;VENTOLIN HFA) 108 (90 BASE) MCG/ACT inhaler Inhale 2 puffs into the lungs every 6 (six) hours as needed. For wheezing      . albuterol (PROVENTIL) (2.5 MG/3ML) 0.083% nebulizer solution Take 2.5 mg by nebulization every 6 (six) hours as needed. For wheezing      . amoxicillin (AMOXIL) 500 MG capsule Take 1 capsule (500 mg total) by mouth 3 (three) times daily.  21 capsule  0  . budesonide-formoterol (SYMBICORT) 160-4.5 MCG/ACT inhaler Inhale 2 puffs into the lungs 2 (two) times daily.        Marland Kitchen docusate sodium 100 MG CAPS Take 100 mg by mouth 2 (two) times daily as needed for constipation.  30 capsule  3  . ferrous sulfate 325 (65 FE) MG tablet Take 1 tablet (325 mg total) by mouth 3 (three) times daily with meals.  90 tablet  3  . ibuprofen (ADVIL,MOTRIN) 600 MG  tablet Take 1 tablet (600 mg total) by mouth every 6 (six) hours as needed for pain (mild pain).  60 tablet  3  . methylPREDNIsolone (MEDROL DOSPACK) 4 MG tablet follow package directions  21 tablet  0  . montelukast (SINGULAIR) 10 MG tablet Take 10 mg by mouth at bedtime.        . ranitidine (ZANTAC) 150 MG capsule Take 150 mg by mouth 2 (two) times daily as needed for heartburn.        Facility-Administered Medications Ordered in Other Encounters  Medication Dose Route Frequency Provider Last Rate Last Dose  . lactated ringers infusion    Continuous PRN Jeani Hawking, CRNA        Exam:  BP 131/90  Pulse 84  Temp(Src) 97.8 F (36.6 C) (Oral)  Resp 20  SpO2 100%  LMP 03/18/2013 Gen: Well NAD HEENT: EOMI,  MMM, left panic membrane is normal right tympanic membrane is retracted. Posterior pharynx is erythematous without exudate. Nasal turbinates are mildly inflamed bilaterally.  Nontender mastoid bilaterally. Nontender TMJ bilaterally. No popping with jaw motion at TMJ  Lungs: CTABL Nl WOB Heart: RRR no MRG Abd: NABS, NT, ND Exts: Non edematous BL  LE, warm and well perfused.  MSK: Normal should and elbow exam on left .  Neck: Tender spinal midline normal  neck range of motion negative Spurling's test bilaterally Left wrist: Normal nontender negative Tinel's positive Phalen's test.  Capillary refill sensation and grip strength are intact distally bilateral upper extremities.   No results found for this or any previous visit (from the past 24 hour(s)). No results found.  Assessment and Plan:  37 y.o. female with Likely viral possibly bacterial URI. This has resulted in retraction of the right TM.  This is the most likely cause of pain. Plan to treat with Medrol Dosepak (she has tolerated Medrol well in the past) and amoxicillin.  This will also help her carpal tunnel syndrome.  Followup with primary care provider in about 2 weeks Discussed warning signs or symptoms. Please see  discharge instructions. Patient expresses understanding.      Rodolph Bong, MD 03/27/13 1149

## 2013-03-27 NOTE — ED Notes (Signed)
C/o right ear pain which has been going on for two weeks.  Patient states it feels as if the ear is popping but doesn't pop feels as if it is clogged up.   Arm pain for two weeks.  No medications taken.  No therapy done.

## 2013-04-25 ENCOUNTER — Encounter (HOSPITAL_BASED_OUTPATIENT_CLINIC_OR_DEPARTMENT_OTHER): Payer: Self-pay | Admitting: Emergency Medicine

## 2013-04-25 ENCOUNTER — Emergency Department (HOSPITAL_BASED_OUTPATIENT_CLINIC_OR_DEPARTMENT_OTHER)
Admission: EM | Admit: 2013-04-25 | Discharge: 2013-04-25 | Disposition: A | Discharge status: Home / Self Care | Payer: No Typology Code available for payment source | Attending: Emergency Medicine | Admitting: Emergency Medicine

## 2013-04-25 ENCOUNTER — Emergency Department (HOSPITAL_BASED_OUTPATIENT_CLINIC_OR_DEPARTMENT_OTHER): Payer: No Typology Code available for payment source

## 2013-04-25 DIAGNOSIS — M542 Cervicalgia: Secondary | ICD-10-CM

## 2013-04-25 DIAGNOSIS — M25532 Pain in left wrist: Secondary | ICD-10-CM

## 2013-04-25 DIAGNOSIS — S6990XA Unspecified injury of unspecified wrist, hand and finger(s), initial encounter: Secondary | ICD-10-CM | POA: Insufficient documentation

## 2013-04-25 DIAGNOSIS — Y9389 Activity, other specified: Secondary | ICD-10-CM | POA: Insufficient documentation

## 2013-04-25 DIAGNOSIS — Z79899 Other long term (current) drug therapy: Secondary | ICD-10-CM | POA: Insufficient documentation

## 2013-04-25 DIAGNOSIS — D649 Anemia, unspecified: Secondary | ICD-10-CM | POA: Insufficient documentation

## 2013-04-25 DIAGNOSIS — K219 Gastro-esophageal reflux disease without esophagitis: Secondary | ICD-10-CM | POA: Insufficient documentation

## 2013-04-25 DIAGNOSIS — Z8742 Personal history of other diseases of the female genital tract: Secondary | ICD-10-CM | POA: Insufficient documentation

## 2013-04-25 DIAGNOSIS — S59909A Unspecified injury of unspecified elbow, initial encounter: Secondary | ICD-10-CM | POA: Insufficient documentation

## 2013-04-25 DIAGNOSIS — Y92410 Unspecified street and highway as the place of occurrence of the external cause: Secondary | ICD-10-CM | POA: Insufficient documentation

## 2013-04-25 DIAGNOSIS — S0993XA Unspecified injury of face, initial encounter: Secondary | ICD-10-CM | POA: Insufficient documentation

## 2013-04-25 DIAGNOSIS — J45909 Unspecified asthma, uncomplicated: Secondary | ICD-10-CM | POA: Insufficient documentation
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
## 2013-04-29 NOTE — ED Provider Notes (Signed)
Medical screening examination/treatment/procedure(s) were performed by non-physician practitioner and as supervising physician I was immediately available for consultation/collaboration.   Audree Camel, MD 04/29/13 [phone]

## 2013-09-26 ENCOUNTER — Emergency Department (HOSPITAL_COMMUNITY): Payer: Medicare Other

## 2013-09-26 ENCOUNTER — Inpatient Hospital Stay (HOSPITAL_COMMUNITY)
Admission: EM | Admit: 2013-09-26 | Discharge: 2013-09-30 | DRG: 202 | Disposition: A | Discharge status: Home / Self Care | Payer: Medicare Other | Attending: Internal Medicine | Admitting: Internal Medicine

## 2013-09-26 ENCOUNTER — Encounter (HOSPITAL_COMMUNITY): Payer: Self-pay | Admitting: Emergency Medicine

## 2013-09-26 DIAGNOSIS — D6489 Other specified anemias: Secondary | ICD-10-CM | POA: Diagnosis present

## 2013-09-26 DIAGNOSIS — J45901 Unspecified asthma with (acute) exacerbation: Principal | ICD-10-CM | POA: Diagnosis present

## 2013-09-26 DIAGNOSIS — R739 Hyperglycemia, unspecified: Secondary | ICD-10-CM

## 2013-09-26 DIAGNOSIS — Z8249 Family history of ischemic heart disease and other diseases of the circulatory system: Secondary | ICD-10-CM

## 2013-09-26 DIAGNOSIS — T380X5A Adverse effect of glucocorticoids and synthetic analogues, initial encounter: Secondary | ICD-10-CM | POA: Diagnosis present

## 2013-09-26 DIAGNOSIS — E139 Other specified diabetes mellitus without complications: Secondary | ICD-10-CM | POA: Diagnosis present

## 2013-09-26 DIAGNOSIS — K219 Gastro-esophageal reflux disease without esophagitis: Secondary | ICD-10-CM | POA: Diagnosis present

## 2013-09-26 DIAGNOSIS — Z79899 Other long term (current) drug therapy: Secondary | ICD-10-CM

## 2013-09-26 DIAGNOSIS — Z23 Encounter for immunization: Secondary | ICD-10-CM

## 2013-09-26 DIAGNOSIS — Z833 Family history of diabetes mellitus: Secondary | ICD-10-CM

## 2013-09-26 DIAGNOSIS — D259 Leiomyoma of uterus, unspecified: Secondary | ICD-10-CM | POA: Diagnosis present

## 2013-09-26 DIAGNOSIS — E099 Drug or chemical induced diabetes mellitus without complications: Secondary | ICD-10-CM

## 2013-09-26 DIAGNOSIS — D649 Anemia, unspecified: Secondary | ICD-10-CM

## 2013-09-26 DIAGNOSIS — Z6841 Body Mass Index (BMI) 40.0 and over, adult: Secondary | ICD-10-CM

## 2013-09-26 DIAGNOSIS — J45909 Unspecified asthma, uncomplicated: Secondary | ICD-10-CM | POA: Diagnosis present

## 2013-09-26 DIAGNOSIS — J45902 Unspecified asthma with status asthmaticus: Secondary | ICD-10-CM

## 2013-09-26 DIAGNOSIS — D500 Iron deficiency anemia secondary to blood loss (chronic): Secondary | ICD-10-CM

## 2013-09-26 LAB — I-STAT CHEM 8, ED
BUN: 6 mg/dL (ref 6–23)
CALCIUM ION: 1.15 mmol/L (ref 1.12–1.23)
Chloride: 105 mEq/L (ref 96–112)
Creatinine, Ser: 0.7 mg/dL (ref 0.50–1.10)
GLUCOSE: 244 mg/dL — AB (ref 70–99)
HEMATOCRIT: 36 % (ref 36.0–46.0)
HEMOGLOBIN: 12.2 g/dL (ref 12.0–15.0)
Potassium: 3.1 mEq/L — ABNORMAL LOW (ref 3.7–5.3)
Sodium: 141 mEq/L (ref 137–147)
TCO2: 17 mmol/L (ref 0–100)

## 2013-09-26 LAB — GLUCOSE, CAPILLARY: GLUCOSE-CAPILLARY: 204 mg/dL — AB (ref 70–99)

## 2013-09-26 LAB — CBC WITH DIFFERENTIAL/PLATELET
BASOS ABS: 0 10*3/uL (ref 0.0–0.1)
Basophils Relative: 0 % (ref 0–1)
EOS ABS: 0 10*3/uL (ref 0.0–0.7)
Eosinophils Relative: 0 % (ref 0–5)
HEMATOCRIT: 33 % — AB (ref 36.0–46.0)
Hemoglobin: 9.8 g/dL — ABNORMAL LOW (ref 12.0–15.0)
LYMPHS ABS: 0.5 10*3/uL — AB (ref 0.7–4.0)
Lymphocytes Relative: 4 % — ABNORMAL LOW (ref 12–46)
MCH: 18.3 pg — ABNORMAL LOW (ref 26.0–34.0)
MCHC: 29.7 g/dL — AB (ref 30.0–36.0)
MCV: 61.7 fL — AB (ref 78.0–100.0)
Monocytes Absolute: 0 10*3/uL — ABNORMAL LOW (ref 0.1–1.0)
Monocytes Relative: 0 % — ABNORMAL LOW (ref 3–12)
NEUTROS ABS: 12.4 10*3/uL — AB (ref 1.7–7.7)
Neutrophils Relative %: 96 % — ABNORMAL HIGH (ref 43–77)
Platelets: 417 10*3/uL — ABNORMAL HIGH (ref 150–400)
RBC: 5.35 MIL/uL — ABNORMAL HIGH (ref 3.87–5.11)
RDW: 21.8 % — AB (ref 11.5–15.5)
WBC: 12.9 10*3/uL — AB (ref 4.0–10.5)

## 2013-09-26 MED ORDER — IBUPROFEN 600 MG PO TABS
600.0000 mg | ORAL_TABLET | Freq: Four times a day (QID) | ORAL | Status: DC | PRN
  Administered 2013-09-26 – 2013-09-27 (×2): 600 mg via ORAL
  Filled 2013-09-26 (×4): qty 1

## 2013-09-26 MED ORDER — SODIUM CHLORIDE 0.9 % IJ SOLN
3.0000 mL | Freq: Two times a day (BID) | INTRAMUSCULAR | Status: DC
  Administered 2013-09-26 – 2013-09-29 (×7): 3 mL via INTRAVENOUS

## 2013-09-26 MED ORDER — FERROUS SULFATE 325 (65 FE) MG PO TABS
325.0000 mg | ORAL_TABLET | Freq: Every day | ORAL | Status: DC
  Administered 2013-09-27 – 2013-09-30 (×4): 325 mg via ORAL
  Filled 2013-09-26 (×5): qty 1

## 2013-09-26 MED ORDER — INSULIN ASPART 100 UNIT/ML ~~LOC~~ SOLN
0.0000 [IU] | Freq: Three times a day (TID) | SUBCUTANEOUS | Status: DC
  Administered 2013-09-27: 2 [IU] via SUBCUTANEOUS
  Administered 2013-09-27: 1 [IU] via SUBCUTANEOUS
  Administered 2013-09-27: 2 [IU] via SUBCUTANEOUS
  Administered 2013-09-28 (×2): 1 [IU] via SUBCUTANEOUS
  Administered 2013-09-29: 2 [IU] via SUBCUTANEOUS
  Administered 2013-09-29 (×2): 1 [IU] via SUBCUTANEOUS

## 2013-09-26 MED ORDER — MONTELUKAST SODIUM 10 MG PO TABS
10.0000 mg | ORAL_TABLET | Freq: Every day | ORAL | Status: DC
  Administered 2013-09-26 – 2013-09-29 (×4): 10 mg via ORAL
  Filled 2013-09-26 (×5): qty 1

## 2013-09-26 MED ORDER — METHYLPREDNISOLONE SODIUM SUCC 125 MG IJ SOLR
125.0000 mg | Freq: Once | INTRAMUSCULAR | Status: DC
  Filled 2013-09-26: qty 2

## 2013-09-26 MED ORDER — LEVALBUTEROL HCL 1.25 MG/0.5ML IN NEBU
1.2500 mg | INHALATION_SOLUTION | Freq: Once | RESPIRATORY_TRACT | Status: AC
  Administered 2013-09-26: 1.25 mg via RESPIRATORY_TRACT
  Filled 2013-09-26: qty 0.5

## 2013-09-26 MED ORDER — ALBUTEROL (5 MG/ML) CONTINUOUS INHALATION SOLN
15.0000 mg/h | INHALATION_SOLUTION | Freq: Once | RESPIRATORY_TRACT | Status: AC
  Administered 2013-09-26: 15 mg/h via RESPIRATORY_TRACT
  Filled 2013-09-26: qty 20

## 2013-09-26 MED ORDER — BUDESONIDE-FORMOTEROL FUMARATE 160-4.5 MCG/ACT IN AERO
2.0000 | INHALATION_SPRAY | Freq: Two times a day (BID) | RESPIRATORY_TRACT | Status: DC
  Administered 2013-09-26 – 2013-09-30 (×8): 2 via RESPIRATORY_TRACT
  Filled 2013-09-26: qty 6

## 2013-09-26 MED ORDER — LORATADINE 10 MG PO TABS
10.0000 mg | ORAL_TABLET | Freq: Every day | ORAL | Status: DC
  Administered 2013-09-26 – 2013-09-30 (×5): 10 mg via ORAL
  Filled 2013-09-26 (×5): qty 1

## 2013-09-26 MED ORDER — IPRATROPIUM-ALBUTEROL 0.5-2.5 (3) MG/3ML IN SOLN
3.0000 mL | Freq: Once | RESPIRATORY_TRACT | Status: AC
  Administered 2013-09-26: 3 mL via RESPIRATORY_TRACT

## 2013-09-26 MED ORDER — ALBUTEROL SULFATE HFA 108 (90 BASE) MCG/ACT IN AERS: 2.0000 | INHALATION_SPRAY | Freq: Four times a day (QID) | RESPIRATORY_TRACT | Status: DC | PRN

## 2013-09-26 MED ORDER — ALBUTEROL SULFATE (2.5 MG/3ML) 0.083% IN NEBU
INHALATION_SOLUTION | RESPIRATORY_TRACT | Status: AC
  Filled 2013-09-26: qty 3

## 2013-09-26 MED ORDER — METHYLPREDNISOLONE SODIUM SUCC 40 MG IJ SOLR
40.0000 mg | Freq: Two times a day (BID) | INTRAMUSCULAR | Status: DC
  Administered 2013-09-27 (×3): 40 mg via INTRAVENOUS
  Filled 2013-09-26 (×5): qty 1

## 2013-09-26 MED ORDER — METHYLPREDNISOLONE SODIUM SUCC 40 MG IJ SOLR
40.0000 mg | Freq: Two times a day (BID) | INTRAMUSCULAR | Status: DC
  Filled 2013-09-26: qty 1

## 2013-09-26 MED ORDER — HEPARIN SODIUM (PORCINE) 5000 UNIT/ML IJ SOLN
5000.0000 [IU] | Freq: Three times a day (TID) | INTRAMUSCULAR | Status: DC
  Administered 2013-09-26 – 2013-09-30 (×12): 5000 [IU] via SUBCUTANEOUS
  Filled 2013-09-26 (×14): qty 1

## 2013-09-26 MED ORDER — ALBUTEROL SULFATE (2.5 MG/3ML) 0.083% IN NEBU: 2.5000 mg | INHALATION_SOLUTION | Freq: Four times a day (QID) | RESPIRATORY_TRACT | Status: DC | PRN

## 2013-09-26 MED ORDER — IPRATROPIUM BROMIDE 0.02 % IN SOLN: 0.5000 mg | Freq: Once | RESPIRATORY_TRACT | Status: DC

## 2013-09-26 MED ORDER — IPRATROPIUM-ALBUTEROL 0.5-2.5 (3) MG/3ML IN SOLN
RESPIRATORY_TRACT | Status: AC
  Filled 2013-09-26: qty 3

## 2013-09-26 MED ORDER — ALBUTEROL SULFATE (2.5 MG/3ML) 0.083% IN NEBU
2.5000 mg | INHALATION_SOLUTION | Freq: Once | RESPIRATORY_TRACT | Status: AC
  Administered 2013-09-26: 2.5 mg via RESPIRATORY_TRACT

## 2013-09-26 MED ORDER — METHYLPREDNISOLONE SODIUM SUCC 125 MG IJ SOLR
125.0000 mg | Freq: Once | INTRAMUSCULAR | Status: AC
  Administered 2013-09-26: 125 mg via INTRAVENOUS

## 2013-09-26 MED ORDER — FAMOTIDINE 20 MG PO TABS
20.0000 mg | ORAL_TABLET | Freq: Two times a day (BID) | ORAL | Status: DC
Start: 2013-09-26 — End: 2013-09-30
  Administered 2013-09-26 – 2013-09-30 (×8): 20 mg via ORAL
  Filled 2013-09-26 (×9): qty 1

## 2013-09-26 MED ORDER — INFLUENZA VAC SPLIT QUAD 0.5 ML IM SUSP
0.5000 mL | INTRAMUSCULAR | Status: AC
  Administered 2013-09-27: 0.5 mL via INTRAMUSCULAR
  Filled 2013-09-26 (×2): qty 0.5

## 2013-09-26 MED ORDER — MAGNESIUM SULFATE 40 MG/ML IJ SOLN
2.0000 g | Freq: Once | INTRAMUSCULAR | Status: AC
  Administered 2013-09-26: 2 g via INTRAVENOUS
  Filled 2013-09-26: qty 50

## 2013-09-26 MED ORDER — ALBUTEROL SULFATE (2.5 MG/3ML) 0.083% IN NEBU: 5.0000 mg | INHALATION_SOLUTION | Freq: Once | RESPIRATORY_TRACT | Status: DC

## 2013-09-26 MED ORDER — IPRATROPIUM BROMIDE 0.02 % IN SOLN
0.5000 mg | Freq: Once | RESPIRATORY_TRACT | Status: AC
  Administered 2013-09-26: 0.5 mg via RESPIRATORY_TRACT
  Filled 2013-09-26: qty 2.5

## 2013-09-26 NOTE — ED Notes (Signed)
RRT KENNETH ATTEMPTED TO START BREATHING TX UNABLE TO FIND MEDICATION WILL PASS ON TO NIGHT SHIFT

## 2013-09-26 NOTE — ED Provider Notes (Signed)
CSN: [id number]     Arrival date & time 09/26/13  1127 History   First MD Initiated Contact with Patient 09/26/13 1138     Chief Complaint  Patient presents with  . Asthma     (Consider location/radiation/quality/duration/timing/severity/associated sxs/prior Treatment) HPI Comments: Patient with history of asthma presents with complaint of worsening wheezing since yesterday. She denies fever, URI symptoms. She has had a cough for a few days. Home albuterol has been helping. She recently finished up a Medrol dose pack. No nausea, vomiting, or diarrhea. Patient was recently exposed to cigarette smoke which she considers to be a trigger. Patient has been hospitalized in the past for asthma flare. The onset of this condition was acute. The course is constant. Aggravating factors: none. Alleviating factors: none.     Patient is a 38 y.o. female presenting with asthma. The history is provided by the patient.  Asthma Associated symptoms include coughing. Pertinent negatives include no abdominal pain, chest pain, fever, headaches, myalgias, nausea, rash, sore throat or vomiting.    Past Medical History  Diagnosis Date  . Asthma   . Environmental allergies   . Fibroids   . Shortness of breath   . GERD (gastroesophageal reflux disease)   . Anemia   . Fibroid, uterine   . Menorrhagia   . Blood transfusion without reported diagnosis    Past Surgical History  Procedure Laterality Date  . Ectopic pregnancy surgery    . Myomectomy    . Cholecystectomy      06/29/2011  . Cholecystectomy  06/27/2011    Procedure: LAPAROSCOPIC CHOLECYSTECTOMY WITH INTRAOPERATIVE CHOLANGIOGRAM;  Surgeon: Gayland Curry, MD;  Location: [place];  Service: General;  Laterality: N/A;  . Cholecystectomy  06/29/2011    Procedure: LAPAROSCOPIC CHOLECYSTECTOMY WITH INTRAOPERATIVE CHOLANGIOGRAM;  Surgeon: Merrie Roof, MD;  Location: [place];  Service: General;  Laterality: N/A;  . Ercp  06/30/2011    Procedure:  ENDOSCOPIC RETROGRADE CHOLANGIOPANCREATOGRAPHY (ERCP);  Surgeon: Jeryl Columbia, MD;  Location: [facility] ENDOSCOPY;  Service: Endoscopy;  Laterality: N/A;  probable sphincterotomy  . Ercp  07/03/2011    Procedure: ENDOSCOPIC RETROGRADE CHOLANGIOPANCREATOGRAPHY (ERCP);  Surgeon: Landry Dyke, MD;  Location: [place];  Service: Endoscopy;  Laterality: N/A;  PROPOFOL  . Myomectomy N/A 11/14/2012    Procedure: MYOMECTOMY;  Surgeon: Osborne Oman, MD;  Location: [facility];  Service: Gynecology;  Laterality: N/A;   Family History  Problem Relation Age of Onset  . Anesthesia problems Neg Hx   . Hypotension Neg Hx   . Malignant hyperthermia Neg Hx   . Pseudochol deficiency Neg Hx   . Hypertension Mother   . Diabetes Mother   . Hypertension Father   . Hypertension Brother    History  Substance Use Topics  . Smoking status: Never Smoker   . Smokeless tobacco: Never Used  . Alcohol Use: No   OB History   Grav Para Term Preterm Abortions TAB SAB Ect Mult Living   1    1   1        Review of Systems  Constitutional: Negative for fever.  HENT: Negative for rhinorrhea and sore throat.   Eyes: Negative for redness.  Respiratory: Positive for cough, shortness of breath and wheezing.   Cardiovascular: Negative for chest pain.  Gastrointestinal: Negative for nausea, vomiting, abdominal pain and diarrhea.  Genitourinary: Negative for dysuria.  Musculoskeletal: Negative for myalgias.  Skin: Negative for rash.  Neurological: Negative for headaches.  Allergies  Bee venom and Prednisone  Home Medications   Current Outpatient Rx  Name  Route  Sig  Dispense  Refill  . albuterol (PROVENTIL HFA;VENTOLIN HFA) 108 (90 BASE) MCG/ACT inhaler   Inhalation   Inhale 2 puffs into the lungs every 6 (six) hours as needed. For wheezing         . albuterol (PROVENTIL) (2.5 MG/3ML) 0.083% nebulizer solution   Nebulization   Take 2.5 mg by nebulization every 6 (six) hours as needed. For wheezing          . budesonide-formoterol (SYMBICORT) 160-4.5 MCG/ACT inhaler   Inhalation   Inhale 2 puffs into the lungs 2 (two) times daily.           Marland Kitchen docusate sodium 100 MG CAPS   Oral   Take 100 mg by mouth 2 (two) times daily as needed for constipation.   30 capsule   3   . ibuprofen (ADVIL,MOTRIN) 600 MG tablet   Oral   Take 1 tablet (600 mg total) by mouth every 6 (six) hours as needed for pain (mild pain).   60 tablet   3   . methylPREDNIsolone (MEDROL DOSPACK) 4 MG tablet      follow package directions   21 tablet   0   . montelukast (SINGULAIR) 10 MG tablet   Oral   Take 10 mg by mouth at bedtime.           . ranitidine (ZANTAC) 150 MG capsule   Oral   Take 150 mg by mouth 2 (two) times daily as needed for heartburn.           BP 154/98  Pulse 104  Temp(Src) 98.1 F (36.7 C) (Oral)  Resp 22  SpO2 97%  LMP 09/22/2013 Physical Exam  Nursing note and vitals reviewed. Constitutional: She appears well-developed and well-nourished.  HENT:  Head: Normocephalic and atraumatic.  Eyes: Conjunctivae are normal. Right eye exhibits no discharge. Left eye exhibits no discharge.  Neck: Normal range of motion. Neck supple.  Cardiovascular: Normal rate, regular rhythm and normal heart sounds.   Pulmonary/Chest: Effort normal. No respiratory distress. She has wheezes (moderate, all fields). She has no rales. She exhibits no tenderness.  Abdominal: Soft. There is no tenderness.  Neurological: She is alert.  Skin: Skin is warm and dry.  Psychiatric: She has a normal mood and affect.    ED Course  Procedures (including critical care time) Labs Review Labs Reviewed - No data to display Imaging Review No results found.   EKG Interpretation None       11:48 AM Patient seen and examined. Work-up initiated. Medications ordered.   Vital signs reviewed and are as follows: Filed Vitals:   09/26/13 1140  BP: 154/98  Pulse: 104  Temp: 98.1 F (36.7 C)  Resp: 22   1:29  PM Patient continues to wheeze. CAT and Mg ordered.   4:48 PM Pt improving after CAT, Mg. O2 stable with ambulation. Mild diffuse wheezing on exam. Still tachycardic and mildly tachypneic.  Will observe and re-eval to decide dispo.   4:50 PM Handoff to [facility] at shift change. If she continues to do well, d/c to home with medrol dose pack. Consider admit if she needs additional treatment.   MDM   Final diagnoses:  Asthma   Pending improvement.     Carlisle Cater, PA-C 09/26/13 1650

## 2013-09-26 NOTE — ED Notes (Signed)
Pt reports sob r/t asthma since last week, controllable at home, worse since yesterday.

## 2013-09-26 NOTE — ED Provider Notes (Signed)
Patient received from [facility], PA-C at shift change. Patient is 38 yo female with hx of asthma since age 52 yo. Patient admits to DOE since last week. Patient states was in [facility] for 1 week and thinks maybe her allergies got flared up and worsened her asthma sxs. Admits to productive cough with white sputum. Patient admits to worsening wheezing since yesterday. Patient states she just finished a medrol dose pack 2 weeks ago. No sick contacts. Using asthma inhalers a lot more frequently (4-5 times per day over the past few days).  Has used Albuterol MDI, Singular, Symbicort, zyrtec. Nebulizer tx at home without good resolution of symptoms Associated Chest tightness, DOE, productive cough, wheezing Last hospitalized for asthma about 1 year ago after surgery for fibroids.   Never been intubated First asthma flare within 12 months.  No other medical conditions.  Age > 56 yo: No HR > 100 bpm: Yes O2 sat on RA < 95%: No Prior hx of venous thromboembolism:No Trauma or surgery in past 4 wks:No Hemoptysis:No Exogenous Estrogen use:No Unilateral Leg swelling: No Pre tests probability for PE < 15%:No  Ambulated with Pulse Oximetry.  patient steady at 100% O2 sat and HR of 145  CXR shows chronic bronchitic changes. No acute infiltrate.  Patient states she feels some better but does not feel comfortable going home.   Patient discussed with Dr. Thurnell Garbe. Will consult with hospitalist. Plan to admit to hospital for further management of asthma exacerbation.   Meds given in ED:  Medications  ipratropium-albuterol (DUONEB) 0.5-2.5 (3) MG/3ML nebulizer solution (  Not Given 09/26/13 1217)  albuterol (PROVENTIL) (2.5 MG/3ML) 0.083% nebulizer solution (  Not Given 09/26/13 1218)  ipratropium-albuterol (DUONEB) 0.5-2.5 (3) MG/3ML nebulizer solution 3 mL (3 mLs Nebulization Given 09/26/13 1216)  albuterol (PROVENTIL) (2.5 MG/3ML) 0.083% nebulizer solution 2.5 mg (2.5 mg Nebulization Given 09/26/13 1216)   methylPREDNISolone sodium succinate (SOLU-MEDROL) 125 mg/2 mL injection 125 mg (125 mg Intravenous Given 09/26/13 1224)  albuterol (PROVENTIL,VENTOLIN) solution continuous neb (15 mg/hr Nebulization Given 09/26/13 1417)  ipratropium (ATROVENT) nebulizer solution 0.5 mg (0.5 mg Nebulization Given 09/26/13 1417)  magnesium sulfate IVPB 2 g 50 mL (0 g Intravenous Stopped 09/26/13 1456)  levalbuterol (XOPENEX) nebulizer solution 1.25 mg (1.25 mg Nebulization Given 09/26/13 1914)    New Prescriptions   No medications on file                       Sherrie George, [place] 09/27/13 2004

## 2013-09-26 NOTE — ED Notes (Signed)
Case management at bedside.

## 2013-09-26 NOTE — Progress Notes (Signed)
Patient arrived on the unit from the ED.

## 2013-09-26 NOTE — ED Notes (Signed)
Report received from [facility]  Introduced Armed forces training and education officer to pt and made pt aware of admission.  Pt currently receiving breathing treatment by RT

## 2013-09-26 NOTE — H&P (Signed)
[facility] History and Physical  [name] [id number] DOB: Dec 07, 1975 DOA: 09/26/2013  Referring physician: EDP PCP: Philis Fendt, MD   Chief Complaint: Asthma exacerbation   HPI: [name] is a 38 y.o. female who presents to the ED with 1 week of increasing SOB.  SOB got worse after finishing up a medrol dos pak (running out of steroid).  Home albuterol has been helping some.  She was recently exposed to cigarette smoke which is one trigger.  She also reports being in [place] this past week and reports that the pollen is out there (another trigger).  She has known allergies to tree and grass pollens though she dosent remember which ones specifically.  She does follow with an allergist and there are plans to re-start allergy shots (which she had taken in the past) once she is over this acute illness.  Long and extensive history of asthma.  Also sees Dr. Melvyn Novas for pulm.  Review of Systems: Systems reviewed.  As above, otherwise negative  Past Medical History  Diagnosis Date  . Asthma   . Environmental allergies   . Fibroids   . Shortness of breath   . GERD (gastroesophageal reflux disease)   . Anemia   . Fibroid, uterine   . Menorrhagia   . Blood transfusion without reported diagnosis    Past Surgical History  Procedure Laterality Date  . Ectopic pregnancy surgery    . Myomectomy    . Cholecystectomy      06/29/2011  . Cholecystectomy  06/27/2011    Procedure: LAPAROSCOPIC CHOLECYSTECTOMY WITH INTRAOPERATIVE CHOLANGIOGRAM;  Surgeon: Gayland Curry, MD;  Location: [place];  Service: General;  Laterality: N/A;  . Cholecystectomy  06/29/2011    Procedure: LAPAROSCOPIC CHOLECYSTECTOMY WITH INTRAOPERATIVE CHOLANGIOGRAM;  Surgeon: Merrie Roof, MD;  Location: [place];  Service: General;  Laterality: N/A;  . Ercp  06/30/2011    Procedure: ENDOSCOPIC RETROGRADE CHOLANGIOPANCREATOGRAPHY (ERCP);  Surgeon: Jeryl Columbia, MD;  Location: [facility] END[facility]COPY;  Service:  Endoscopy;  Laterality: N/A;  probable sphincterotomy  . Ercp  07/03/2011    Procedure: ENDOSCOPIC RETROGRADE CHOLANGIOPANCREATOGRAPHY (ERCP);  Surgeon: Landry Dyke, MD;  Location: [place];  Service: Endoscopy;  Laterality: N/A;  PROPOFOL  . Myomectomy N/A 11/14/2012    Procedure: MYOMECTOMY;  Surgeon: Osborne Oman, MD;  Location: [facility];  Service: Gynecology;  Laterality: N/A;   Social History:  reports that she has never smoked. She has never used smokeless tobacco. She reports that she does not drink alcohol or use illicit drugs.  Allergies  Allergen Reactions  . Bee Venom Anaphylaxis    Patient has an epi pen, also has not been stung before but was informed she is allergic    Family History  Problem Relation Age of Onset  . Anesthesia problems Neg Hx   . Hypotension Neg Hx   . Malignant hyperthermia Neg Hx   . Pseudochol deficiency Neg Hx   . Hypertension Mother   . Diabetes Mother   . Hypertension Father   . Hypertension Brother      Prior to Admission medications   Medication Sig Start Date End Date Taking? Authorizing Provider  albuterol (PROVENTIL HFA;VENTOLIN HFA) 108 (90 BASE) MCG/ACT inhaler Inhale 2 puffs into the lungs every 6 (six) hours as needed. For wheezing   Yes Historical Provider, MD  albuterol (PROVENTIL) (2.5 MG/3ML) 0.083% nebulizer solution Take 2.5 mg by nebulization every 6 (six) hours as needed. For wheezing  Yes Historical Provider, MD  budesonide-formoterol (SYMBICORT) 160-4.5 MCG/ACT inhaler Inhale 2 puffs into the lungs 2 (two) times daily.     Yes Historical Provider, MD  cetirizine (ZYRTEC) 10 MG tablet Take 10 mg by mouth daily.   Yes Historical Provider, MD  ferrous sulfate 325 (65 FE) MG tablet Take 325 mg by mouth daily with breakfast.   Yes Historical Provider, MD  ibuprofen (ADVIL,MOTRIN) 600 MG tablet Take 1 tablet (600 mg total) by mouth every 6 (six) hours as needed for pain (mild pain). 11/18/12  Yes Ugonna A Anyanwu, MD  montelukast  (SINGULAIR) 10 MG tablet Take 10 mg by mouth at bedtime.     Yes Historical Provider, MD  ranitidine (ZANTAC) 150 MG capsule Take 150 mg by mouth 2 (two) times daily as needed for heartburn.    Yes Historical Provider, MD   Physical Exam: Filed Vitals:   09/26/13 1546  BP: 142/61  Pulse: 122  Temp: 98.3 F (36.8 C)  Resp: 16    BP 142/61  Pulse 122  Temp(Src) 98.3 F (36.8 C) (Oral)  Resp 16  SpO2 97%  LMP 09/26/2013  General Appearance:    Alert, oriented, no distress, appears stated age  Head:    Normocephalic, atraumatic  Eyes:    PERRL, EOMI, sclera non-icteric        Nose:   Nares without drainage or epistaxis. Mucosa, turbinates normal  Throat:   Moist mucous membranes. Oropharynx without erythema or exudate.  Neck:   Supple. No carotid bruits.  No thyromegaly.  No lymphadenopathy.   Back:     No CVA tenderness, no spinal tenderness  Lungs:     B wheezes  Chest wall:    No tenderness to palpitation  Heart:    Regular rate and rhythm without murmurs, gallops, rubs  Abdomen:     Soft, non-tender, nondistended, normal bowel sounds, no organomegaly  Genitalia:    deferred  Rectal:    deferred  Extremities:   No clubbing, cyanosis or edema.  Pulses:   2+ and symmetric all extremities  Skin:   Skin color, texture, turgor normal, no rashes or lesions  Lymph nodes:   Cervical, supraclavicular, and axillary nodes normal  Neurologic:   CNII-XII intact. Normal strength, sensation and reflexes      throughout    Labs on Admission:  Basic Metabolic Panel:  Recent Labs Lab 09/26/13 1844  NA 141  K 3.1*  CL 105  GLUCOSE 244*  BUN 6  CREATININE 0.70   Liver Function Tests: No results found for this basename: AST, ALT, ALKPHOS, BILITOT, PROT, ALBUMIN,  in the last 168 hours No results found for this basename: LIPASE, AMYLASE,  in the last 168 hours No results found for this basename: AMMONIA,  in the last 168 hours CBC:  Recent Labs Lab 09/26/13 1838  09/26/13 1844  WBC 12.9*  --   NEUTROABS PENDING  --   HGB 9.8* 12.2  HCT 33.0* 36.0  MCV 61.7*  --   PLT PENDING  --    Cardiac Enzymes: No results found for this basename: CKTOTAL, CKMB, CKMBINDEX, TROPONINI,  in the last 168 hours  BNP (last 3 results) No results found for this basename: PROBNP,  in the last 8760 hours CBG: No results found for this basename: GLUCAP,  in the last 168 hours  Radiological Exams on Admission: Dg Chest 2 View  09/26/2013   CLINICAL DATA:  Asthma, shortness of breath, post breathing treatment  EXAM: CHEST  2 VIEW  COMPARISON:  05/25/2012  FINDINGS: Upper normal heart size.  Normal mediastinal contours and pulmonary vascularity.  Peribronchial thickening and slight accentuation of interstitial markings diffusely, little changed.  No segmental consolidation, pleural effusion or pneumothorax.  Bones unremarkable.  IMPRESSION: Chronic bronchitic changes.  Scattered interstitial prominence, little changed from previous exam.  No acute infiltrate.   Electronically Signed   By: Lavonia Dana M.D.   On: 09/26/2013 17:52    EKG: Independently reviewed.  Assessment/Plan Active Problems:   Asthma   Steroid-induced diabetes   Asthma exacerbation   Asthma exacerbation - improved after ED treatments, continue steroids, breathing treatments.  Long term follow up with allergenist already planned it sounds like (patient already seeing one), apparently allergy shots have helped in the past.  Patient has been admitted before for asthma exacerbation as well.  Steroid-induced diabetes - will put patient on low dose SSI AC/HS while here and on steroids, does not normally carry a diagnosis of DM.    Code Status: Full Code  Family Communication: No family in room Disposition Plan: Admit to obs   Time spent: 50 min  Jaeven Wanzer M. [facility] Pager [phone]  If 7AM-7PM, please contact the day team taking care of the patient [email or web address] Password  [facility] 09/26/2013, 7:36 PM

## 2013-09-26 NOTE — ED Notes (Signed)
MD at bedside. EDPA Josh

## 2013-09-26 NOTE — Progress Notes (Signed)
   CARE MANAGEMENT ED NOTE 09/26/2013  Patient:  [name], [name]   Account Number:  [id number]  Date Initiated:  09/26/2013  Documentation initiated by:  Livia Snellen  Subjective/Objective Assessment:   Patient presents to ED with increased shortness of breath.     Subjective/Objective Assessment Detail:   Patient with history of asthma.     Action/Plan:   Action/Plan Detail:   Anticipated DC Date:       Status Recommendation to Physician:   Result of Recommendation:    Other ED Services  Consult Working [place]  CM consult  Other  PCP issues    Choice offered to / List presented to:            Status of service:  Completed, signed off  ED Comments:   ED Comments Detail:  EDCM consulted to see patient regarding home health services and medication assistance.  Patient spoke to patient at bedside.  Patient on room air pulse ox 99%. Patient reports she is on disablity.  Patient has Medicare and Medicaid insurance.  Patient reports even with her insurance she has difficulty affording her medications. [facility] provided patient with information regarding Medicare donut hole.  Instructed patient to call Medicare insurance and inquire about medication assistance and Medicare part D.  EDCM also provided patient with list of financial resources in the community suach as local churches and [employer], list of discounted pharmacies, websites needymeds.org and GoodRX.com for medication assistance. Patient reports she gets her prescriptions filled at [facility].  Patient reports she would like home health services to help with cleaning etc.  She reports she lives on the second floor and is now moving to the first floor as she can no longer walk up the steps.  EDCM provided patient with list of private duty nursing agencies.  Patient thankful for services.  No further EDCM needs at this time.

## 2013-09-26 NOTE — ED Notes (Signed)
Walked patient in the hallway.  Oxygen level 100% and heart rate was 145

## 2013-09-26 NOTE — Progress Notes (Signed)
Pt given 1.25 xop tx.  RT unaware that it was in pyxis in ED.

## 2013-09-26 NOTE — ED Provider Notes (Signed)
Medical screening examination/treatment/procedure(s) were conducted as a shared visit with non-physician practitioner(s) and myself.  I personally evaluated the patient during the encounter.    Pt received at changed of shift. Pt c/o gradual onset and worsening of persistent SOB, cough, wheezing for the past several weeks, worse over the past several days. Pt states she has been evaluated by her PMD for same, rx MDI, nebs, and PO steroids without relief. Pt received hour long neb, IV steroid, IV magnesium while in the ED. Pt continues to have tachycardia, tachypneia, lungs with scattered insp/exp wheezing, no audible wheezing. Will dose another neb, admit. Dx and testing d/w pt.  Questions answered.  Verb understanding, agreeable to admit.  T/C to [facility] Dr. Alcario Drought, case discussed, including:  HPI, pertinent PM/SHx, VS/PE, dx testing, ED course and treatment:  Agreeable to admit, requests to write temporary orders, obtain tele bed to team 8.   CRITICAL CARE Performed by: Alfonzo Feller Total critical care time: 40 Critical care time was exclusive of separately billable procedures and treating other patients. Critical care was necessary to treat or prevent imminent or life-threatening deterioration. Critical care was time spent personally by me on the following activities: development of treatment plan with patient and/or surrogate as well as nursing, discussions with consultants, evaluation of patient's response to treatment, examination of patient, obtaining history from patient or surrogate, ordering and performing treatments and interventions, ordering and review of laboratory studies, ordering and review of radiographic studies, pulse oximetry and re-evaluation of patient's condition.   Results for orders placed during the hospital encounter of 09/26/13  CBC WITH DIFFERENTIAL      Result Value Ref Range   WBC 12.9 (*) 4.0 - 10.5 K/uL   RBC 5.35 (*) 3.87 - 5.11 MIL/uL   Hemoglobin 9.8 (*)  12.0 - 15.0 g/dL   HCT 33.0 (*) 36.0 - 46.0 %   MCV 61.7 (*) 78.0 - 100.0 fL   MCH 18.3 (*) 26.0 - 34.0 pg   MCHC 29.7 (*) 30.0 - 36.0 g/dL   RDW 21.8 (*) 11.5 - 15.5 %   Platelets PENDING  150 - 400 K/uL   Neutrophils Relative % PENDING  43 - 77 %   Neutro Abs PENDING  1.7 - 7.7 K/uL   Band Neutrophils PENDING  0 - 10 %   Lymphocytes Relative PENDING  12 - 46 %   Lymphs Abs PENDING  0.7 - 4.0 K/uL   Monocytes Relative PENDING  3 - 12 %   Monocytes Absolute PENDING  0.1 - 1.0 K/uL   Eosinophils Relative PENDING  0 - 5 %   Eosinophils Absolute PENDING  0.0 - 0.7 K/uL   Basophils Relative PENDING  0 - 1 %   Basophils Absolute PENDING  0.0 - 0.1 K/uL   WBC Morphology PENDING     RBC Morphology PENDING     Smear Review PENDING     nRBC PENDING  0 /100 WBC   Metamyelocytes Relative PENDING     Myelocytes PENDING     Promyelocytes Absolute PENDING     Blasts PENDING    I-STAT CHEM 8, ED      Result Value Ref Range   Sodium 141  137 - 147 mEq/L   Potassium 3.1 (*) 3.7 - 5.3 mEq/L   Chloride 105  96 - 112 mEq/L   BUN 6  6 - 23 mg/dL   Creatinine, Ser 0.70  0.50 - 1.10 mg/dL   Glucose, Bld 244 (*) 70 -  99 mg/dL   Calcium, Ion 1.15  1.12 - 1.23 mmol/L   TCO2 17  0 - 100 mmol/L   Hemoglobin 12.2  12.0 - 15.0 g/dL   HCT 36.0  36.0 - 46.0 %   Dg Chest 2 View 09/26/2013   CLINICAL DATA:  Asthma, shortness of breath, post breathing treatment  EXAM: CHEST  2 VIEW  COMPARISON:  05/25/2012  FINDINGS: Upper normal heart size.  Normal mediastinal contours and pulmonary vascularity.  Peribronchial thickening and slight accentuation of interstitial markings diffusely, little changed.  No segmental consolidation, pleural effusion or pneumothorax.  Bones unremarkable.  IMPRESSION: Chronic bronchitic changes.  Scattered interstitial prominence, little changed from previous exam.  No acute infiltrate.   Electronically Signed   By: Lavonia Dana M.D.   On: 09/26/2013 17:52        Alfonzo Feller,  DO 09/26/13 1942

## 2013-09-27 DIAGNOSIS — D500 Iron deficiency anemia secondary to blood loss (chronic): Secondary | ICD-10-CM

## 2013-09-27 LAB — BASIC METABOLIC PANEL
BUN: 9 mg/dL (ref 6–23)
CALCIUM: 9.2 mg/dL (ref 8.4–10.5)
CHLORIDE: 104 meq/L (ref 96–112)
CO2: 20 mEq/L (ref 19–32)
CREATININE: 0.7 mg/dL (ref 0.50–1.10)
GFR calc non Af Amer: >90 mL/min (ref >90)
Glucose, Bld: 147 mg/dL — ABNORMAL HIGH (ref 70–99)
Potassium: 3.9 mEq/L (ref 3.7–5.3)
Sodium: 136 mEq/L — ABNORMAL LOW (ref 137–147)

## 2013-09-27 LAB — GLUCOSE, CAPILLARY
Glucose-Capillary: 134 mg/dL — ABNORMAL HIGH (ref 70–99)
Glucose-Capillary: 168 mg/dL — ABNORMAL HIGH (ref 70–99)
Glucose-Capillary: 171 mg/dL — ABNORMAL HIGH (ref 70–99)
Glucose-Capillary: 134 mg/dL — ABNORMAL HIGH (ref 70–99)

## 2013-09-27 LAB — CBC
HCT: 32.4 % — ABNORMAL LOW (ref 36.0–46.0)
Hemoglobin: 9.5 g/dL — ABNORMAL LOW (ref 12.0–15.0)
MCH: 18.2 pg — AB (ref 26.0–34.0)
MCHC: 29.3 g/dL — ABNORMAL LOW (ref 30.0–36.0)
MCV: 62.2 fL — ABNORMAL LOW (ref 78.0–100.0)
PLATELETS: 442 10*3/uL — AB (ref 150–400)
RBC: 5.21 MIL/uL — AB (ref 3.87–5.11)
RDW: 21.9 % — AB (ref 11.5–15.5)
WBC: 13.1 10*3/uL — AB (ref 4.0–10.5)

## 2013-09-27 LAB — MRSA PCR SCREENING: MRSA BY PCR: NEGATIVE

## 2013-09-27 MED ORDER — ZOLPIDEM TARTRATE 5 MG PO TABS
5.0000 mg | ORAL_TABLET | Freq: Every evening | ORAL | Status: DC | PRN
  Administered 2013-09-27 – 2013-09-28 (×2): 5 mg via ORAL
  Filled 2013-09-27 (×2): qty 1

## 2013-09-27 MED ORDER — ALBUTEROL SULFATE (2.5 MG/3ML) 0.083% IN NEBU
2.5000 mg | INHALATION_SOLUTION | Freq: Four times a day (QID) | RESPIRATORY_TRACT | Status: DC
  Administered 2013-09-27 – 2013-09-29 (×10): 2.5 mg via RESPIRATORY_TRACT
  Filled 2013-09-27 (×10): qty 3

## 2013-09-27 MED ORDER — ZOLPIDEM TARTRATE 5 MG PO TABS
5.0000 mg | ORAL_TABLET | Freq: Once | ORAL | Status: AC
  Administered 2013-09-27: 5 mg via ORAL
  Filled 2013-09-27: qty 1

## 2013-09-27 MED ORDER — KETOROLAC TROMETHAMINE 15 MG/ML IJ SOLN
30.0000 mg | Freq: Once | INTRAMUSCULAR | Status: AC
  Administered 2013-09-27: 30 mg via INTRAVENOUS
  Filled 2013-09-27: qty 2
  Filled 2013-09-27: qty 1

## 2013-09-27 MED ORDER — SALINE SPRAY 0.65 % NA SOLN
1.0000 | NASAL | Status: DC | PRN
  Filled 2013-09-27: qty 44

## 2013-09-27 MED ORDER — ACETAMINOPHEN 325 MG PO TABS
650.0000 mg | ORAL_TABLET | Freq: Four times a day (QID) | ORAL | Status: DC | PRN
  Administered 2013-09-28 – 2013-09-30 (×3): 650 mg via ORAL
  Filled 2013-09-27 (×3): qty 2

## 2013-09-27 MED ORDER — DOXYCYCLINE HYCLATE 100 MG PO TABS
100.0000 mg | ORAL_TABLET | Freq: Two times a day (BID) | ORAL | Status: DC
  Administered 2013-09-27 – 2013-09-30 (×7): 100 mg via ORAL
  Filled 2013-09-27 (×8): qty 1

## 2013-09-27 NOTE — ED Provider Notes (Signed)
Medical screening examination/treatment/procedure(s) were performed by non-physician practitioner and as supervising physician I was immediately available for consultation/collaboration.   EKG Interpretation None        Osvaldo Shipper, MD 09/27/13 [phone]

## 2013-09-27 NOTE — Progress Notes (Signed)
PROGRESS NOTE    [name]:[id number] DOB: Apr 23, 1976 DOA: 09/26/2013 PCP: Philis Fendt, MD  HPI/Brief narrative 38 year old female with history of asthma since childhood, several environmental allergies, GERD, anemia, admitted on 09/26/13 with one week history of worsening dyspnea-recently completed Medrol Dosepak. Admitted for asthma exacerbation.   Assessment/Plan:  1. Asthma exacerbation: Treating empirically with IV Solu-Medrol, bronchodilator nebulizations and oxygen. She gives history of productive cough-will add brief course of oral doxycycline. She also complains of nasal stuffiness-will add saline nasal spray. Improving. Monitor closely. Continue outpatient followup with allergist. Chest x-ray without acute infiltrate 2. GERD: Continue Pepcid 3. Chronic anemia: Stable 4. Steroid-induced hyperglycemia/DM: SSI.   Code Status: Full Family Communication: Discussed with patient's sister and niece at bedside. Disposition Plan: Home when medically stable   Consultants:  None  Procedures:  None  Antibiotics:  Oral doxycycline 3/18 >   Subjective: Feels much better than on admission. States that her dyspnea is 50% better. Still has DOE. Cough with intermittent yellowish sputum. Anterior chest pain on coughing. Nasal stuffiness.  Objective: Filed Vitals:   09/27/13 0531 09/27/13 1037 09/27/13 1433 09/27/13 1444  BP: 143/85   136/67  Pulse: 82   91  Temp: 98.2 F (36.8 C)   97.3 F (36.3 C)  TempSrc: Oral   Oral  Resp: 18   18  Height:      Weight:      SpO2: 100% 98% 98% 100%    Intake/Output Summary (Last 24 hours) at 09/27/13 1855 Last data filed at 09/27/13 1145  Gross per 24 hour  Intake    360 ml  Output      1 ml  Net    359 ml   Filed Weights   09/26/13 2055  Weight: 125.556 kg (276 lb 12.8 oz)     Exam:  General exam: Young morbidly obese female sitting comfortably in bed. Respiratory system: Fair breath sounds bilaterally with  occasional scattered expiratory rhonchi. No increased work of breathing. Able to speak in full sentences. Cardiovascular system: S1 & S2 heard, RRR. No JVD, murmurs, gallops, clicks or pedal edema. Telemetry: Sinus rhythm Gastrointestinal system: Abdomen is nondistended, soft and nontender. Normal bowel sounds heard. Central nervous system: Alert and oriented. No focal neurological deficits. Extremities: Symmetric 5 x 5 power.   Data Reviewed: Basic Metabolic Panel:  Recent Labs Lab 09/26/13 1844 09/27/13 0353  NA 141 136*  K 3.1* 3.9  CL 105 104  CO2  --  20  GLUCOSE 244* 147*  BUN 6 9  CREATININE 0.70 0.70  CALCIUM  --  9.2   Liver Function Tests: No results found for this basename: AST, ALT, ALKPHOS, BILITOT, PROT, ALBUMIN,  in the last 168 hours No results found for this basename: LIPASE, AMYLASE,  in the last 168 hours No results found for this basename: AMMONIA,  in the last 168 hours CBC:  Recent Labs Lab 09/26/13 1838 09/26/13 1844 09/27/13 0353  WBC 12.9*  --  13.1*  NEUTROABS 12.4*  --   --   HGB 9.8* 12.2 9.5*  HCT 33.0* 36.0 32.4*  MCV 61.7*  --  62.2*  PLT 417*  --  442*   Cardiac Enzymes: No results found for this basename: CKTOTAL, CKMB, CKMBINDEX, TROPONINI,  in the last 168 hours BNP (last 3 results) No results found for this basename: PROBNP,  in the last 8760 hours CBG:  Recent Labs Lab 09/26/13 2110 09/27/13 0738 09/27/13 1221 09/27/13 1734  GLUCAP  204* 134* 168* 171*    Recent Results (from the past 240 hour(s))  MRSA PCR SCREENING     Status: None   Collection Time    09/26/13 10:50 PM      Result Value Ref Range Status   MRSA by PCR NEGATIVE  NEGATIVE Final   Comment:            The GeneXpert MRSA Assay (FDA     approved for NASAL specimens     only), is one component of a     comprehensive MRSA colonization     surveillance program. It is not     intended to diagnose MRSA     infection nor to guide or     monitor treatment  for     MRSA infections.        Studies: Dg Chest 2 View  09/26/2013   CLINICAL DATA:  Asthma, shortness of breath, post breathing treatment  EXAM: CHEST  2 VIEW  COMPARISON:  05/25/2012  FINDINGS: Upper normal heart size.  Normal mediastinal contours and pulmonary vascularity.  Peribronchial thickening and slight accentuation of interstitial markings diffusely, little changed.  No segmental consolidation, pleural effusion or pneumothorax.  Bones unremarkable.  IMPRESSION: Chronic bronchitic changes.  Scattered interstitial prominence, little changed from previous exam.  No acute infiltrate.   Electronically Signed   By: Lavonia Dana M.D.   On: 09/26/2013 17:52        Scheduled Meds: . albuterol  2.5 mg Nebulization Q6H  . budesonide-formoterol  2 puff Inhalation BID  . doxycycline  100 mg Oral Q12H  . famotidine  20 mg Oral BID  . ferrous sulfate  325 mg Oral QPC breakfast  . heparin  5,000 Units Subcutaneous 3 times per day  . insulin aspart  0-9 Units Subcutaneous TID WC  . loratadine  10 mg Oral Daily  . methylPREDNISolone (SOLU-MEDROL) injection  40 mg Intravenous Q12H  . montelukast  10 mg Oral QHS  . sodium chloride  3 mL Intravenous Q12H   Continuous Infusions:   Active Problems:   Asthma   Steroid-induced diabetes   Asthma exacerbation    Time spent: 54 minutes    Carlena Ruybal, MD, FACP, FHM. [facility] Pager [phone]  If 7PM-7AM, please contact night-coverage [email or web address] Password [facility] 09/27/2013, 6:55 PM    LOS: 1 day

## 2013-09-28 DIAGNOSIS — R7309 Other abnormal glucose: Secondary | ICD-10-CM

## 2013-09-28 DIAGNOSIS — K219 Gastro-esophageal reflux disease without esophagitis: Secondary | ICD-10-CM

## 2013-09-28 LAB — HEMOGLOBIN A1C
Hgb A1c MFr Bld: 5.8 % — ABNORMAL HIGH (ref <5.7)
MEAN PLASMA GLUCOSE: 120 mg/dL — AB (ref <117)

## 2013-09-28 LAB — GLUCOSE, CAPILLARY
Glucose-Capillary: 127 mg/dL — ABNORMAL HIGH (ref 70–99)
Glucose-Capillary: 105 mg/dL — ABNORMAL HIGH (ref 70–99)
Glucose-Capillary: 146 mg/dL — ABNORMAL HIGH (ref 70–99)
Glucose-Capillary: 160 mg/dL — ABNORMAL HIGH (ref 70–99)

## 2013-09-28 MED ORDER — METHYLPREDNISOLONE SODIUM SUCC 125 MG IJ SOLR
60.0000 mg | Freq: Three times a day (TID) | INTRAMUSCULAR | Status: DC
  Administered 2013-09-28 – 2013-09-29 (×3): 60 mg via INTRAVENOUS
  Filled 2013-09-28 (×6): qty 0.96

## 2013-09-28 MED ORDER — HYDROCODONE-ACETAMINOPHEN 5-325 MG PO TABS
1.0000 | ORAL_TABLET | Freq: Four times a day (QID) | ORAL | Status: DC | PRN
  Administered 2013-09-28 – 2013-09-29 (×3): 1 via ORAL
  Filled 2013-09-28 (×3): qty 1

## 2013-09-28 NOTE — Progress Notes (Signed)
PROGRESS NOTE    KAIDEN DARDIS IEP:[id number] DOB: 29-Apr-1976 DOA: 09/26/2013 PCP: Philis Fendt, MD  HPI/Brief narrative 38 year old female with history of asthma since childhood, several environmental allergies, GERD, anemia, admitted on 09/26/13 with one week history of worsening dyspnea-recently completed Medrol Dosepak. Admitted for asthma exacerbation.   Assessment/Plan:  1. Asthma exacerbation: Treating empirically with IV Solu-Medrol, bronchodilator nebulizations and oxygen. She gives history of productive cough-will add brief course of oral doxycycline. She also complains of nasal stuffiness-will add saline nasal spray. Continue outpatient followup with allergist. Chest x-ray without acute infiltrate. Nasal stuffiness has improved. Still with DOE. We'll increase Solu-Medrol to 60 mg IV every 8 hours. Monitor closely. Patient has some chest pain due to constant coughing. Pain management 2. GERD: Continue Pepcid 3. Chronic anemia: Stable 4. Steroid-induced hyperglycemia: SSI. As per patient, not told to be diabetic.   Code Status: Full Family Communication: None at bedside. Disposition Plan: Home when medically stable   Consultants:  None  Procedures:  None  Antibiotics:  Oral doxycycline 3/18 >   Subjective: Nasal stuffiness has improved. Chest pain with constant coughing. DOE persists  Objective: Filed Vitals:   09/28/13 0837 09/28/13 0949 09/28/13 1341 09/28/13 1506  BP: 140/61  138/88   Pulse: 71  72   Temp: 97.5 F (36.4 C)  97.6 F (36.4 C)   TempSrc: Oral  Oral   Resp:   20   Height:      Weight:      SpO2: 100% 100% 100% 97%    Intake/Output Summary (Last 24 hours) at 09/28/13 1654 Last data filed at 09/28/13 0545  Gross per 24 hour  Intake      0 ml  Output    500 ml  Net   -500 ml   Filed Weights   09/26/13 2055  Weight: 125.556 kg (276 lb 12.8 oz)     Exam:  General exam: Young morbidly obese female sitting comfortably in  bed. Respiratory system: Fair breath sounds bilaterally with occasional scattered expiratory rhonchi. No increased work of breathing. Able to speak in full sentences. Cardiovascular system: S1 & S2 heard, RRR. No JVD, murmurs, gallops, clicks or pedal edema. Telemetry: Sinus rhythm Gastrointestinal system: Abdomen is nondistended, soft and nontender. Normal bowel sounds heard. Central nervous system: Alert and oriented. No focal neurological deficits. Extremities: Symmetric 5 x 5 power.   Data Reviewed: Basic Metabolic Panel:  Recent Labs Lab 09/26/13 1844 09/27/13 0353  NA 141 136*  K 3.1* 3.9  CL 105 104  CO2  --  20  GLUCOSE 244* 147*  BUN 6 9  CREATININE 0.70 0.70  CALCIUM  --  9.2   Liver Function Tests: No results found for this basename: AST, ALT, ALKPHOS, BILITOT, PROT, ALBUMIN,  in the last 168 hours No results found for this basename: LIPASE, AMYLASE,  in the last 168 hours No results found for this basename: AMMONIA,  in the last 168 hours CBC:  Recent Labs Lab 09/26/13 1838 09/26/13 1844 09/27/13 0353  WBC 12.9*  --  13.1*  NEUTROABS 12.4*  --   --   HGB 9.8* 12.2 9.5*  HCT 33.0* 36.0 32.4*  MCV 61.7*  --  62.2*  PLT 417*  --  442*   Cardiac Enzymes: No results found for this basename: CKTOTAL, CKMB, CKMBINDEX, TROPONINI,  in the last 168 hours BNP (last 3 results) No results found for this basename: PROBNP,  in the last 8760 hours CBG:  Recent  Labs Lab 09/27/13 1221 09/27/13 1734 09/27/13 2154 09/28/13 0732 09/28/13 1144  GLUCAP 168* 171* 134* 127* 105*    Recent Results (from the past 240 hour(s))  MRSA PCR SCREENING     Status: None   Collection Time    09/26/13 10:50 PM      Result Value Ref Range Status   MRSA by PCR NEGATIVE  NEGATIVE Final   Comment:            The GeneXpert MRSA Assay (FDA     approved for NASAL specimens     only), is one component of a     comprehensive MRSA colonization     surveillance program. It is not      intended to diagnose MRSA     infection nor to guide or     monitor treatment for     MRSA infections.        Studies: Dg Chest 2 View  09/26/2013   CLINICAL DATA:  Asthma, shortness of breath, post breathing treatment  EXAM: CHEST  2 VIEW  COMPARISON:  05/25/2012  FINDINGS: Upper normal heart size.  Normal mediastinal contours and pulmonary vascularity.  Peribronchial thickening and slight accentuation of interstitial markings diffusely, little changed.  No segmental consolidation, pleural effusion or pneumothorax.  Bones unremarkable.  IMPRESSION: Chronic bronchitic changes.  Scattered interstitial prominence, little changed from previous exam.  No acute infiltrate.   Electronically Signed   By: Lavonia Dana M.D.   On: 09/26/2013 17:52        Scheduled Meds: . albuterol  2.5 mg Nebulization Q6H  . budesonide-formoterol  2 puff Inhalation BID  . doxycycline  100 mg Oral Q12H  . famotidine  20 mg Oral BID  . ferrous sulfate  325 mg Oral QPC breakfast  . heparin  5,000 Units Subcutaneous 3 times per day  . insulin aspart  0-9 Units Subcutaneous TID WC  . loratadine  10 mg Oral Daily  . methylPREDNISolone (SOLU-MEDROL) injection  60 mg Intravenous Q8H  . montelukast  10 mg Oral QHS  . sodium chloride  3 mL Intravenous Q12H   Continuous Infusions:   Active Problems:   Asthma   Steroid-induced diabetes   Asthma exacerbation    Time spent: 8 minutes    Ava Deguire, MD, FACP, FHM. [facility] Pager [phone]  If 7PM-7AM, please contact night-coverage [email or web address] Password [email or web address] 09/28/2013, 4:54 PM    LOS: 2 days

## 2013-09-29 DIAGNOSIS — D649 Anemia, unspecified: Secondary | ICD-10-CM

## 2013-09-29 LAB — GLUCOSE, CAPILLARY
GLUCOSE-CAPILLARY: 128 mg/dL — AB (ref 70–99)
Glucose-Capillary: 125 mg/dL — ABNORMAL HIGH (ref 70–99)
Glucose-Capillary: 141 mg/dL — ABNORMAL HIGH (ref 70–99)
Glucose-Capillary: 133 mg/dL — ABNORMAL HIGH (ref 70–99)

## 2013-09-29 MED ORDER — METHYLPREDNISOLONE 4 MG PO KIT
8.0000 mg | PACK | Freq: Every evening | ORAL | Status: AC
  Administered 2013-09-29: 8 mg via ORAL

## 2013-09-29 MED ORDER — METHYLPREDNISOLONE 4 MG PO KIT
4.0000 mg | PACK | ORAL | Status: AC
  Administered 2013-09-29: 4 mg via ORAL

## 2013-09-29 MED ORDER — METHYLPREDNISOLONE 4 MG PO KIT: 8.0000 mg | PACK | Freq: Every evening | ORAL | Status: DC

## 2013-09-29 MED ORDER — METHYLPREDNISOLONE 4 MG PO KIT
8.0000 mg | PACK | Freq: Every morning | ORAL | Status: AC
  Administered 2013-09-29: 8 mg via ORAL
  Filled 2013-09-29: qty 21

## 2013-09-29 MED ORDER — ALBUTEROL SULFATE (2.5 MG/3ML) 0.083% IN NEBU
2.5000 mg | INHALATION_SOLUTION | Freq: Three times a day (TID) | RESPIRATORY_TRACT | Status: DC
  Administered 2013-09-30: 2.5 mg via RESPIRATORY_TRACT
  Filled 2013-09-29 (×2): qty 3

## 2013-09-29 MED ORDER — METHYLPREDNISOLONE 4 MG PO KIT
4.0000 mg | PACK | Freq: Three times a day (TID) | ORAL | Status: DC
  Administered 2013-09-30 (×2): 4 mg via ORAL

## 2013-09-29 MED ORDER — METHYLPREDNISOLONE 4 MG PO KIT: 4.0000 mg | PACK | Freq: Four times a day (QID) | ORAL | Status: DC

## 2013-09-29 MED ORDER — METHYLPREDNISOLONE SODIUM SUCC 125 MG IJ SOLR
60.0000 mg | Freq: Two times a day (BID) | INTRAMUSCULAR | Status: DC
  Filled 2013-09-29 (×2): qty 0.96

## 2013-09-29 NOTE — Progress Notes (Signed)
PROGRESS NOTE    CLORINDA WYBLE FXT:[id number] DOB: [birth year] DOA: 09/26/2013 PCP: Philis Fendt, MD  HPI/Brief narrative 38 year old female with history of asthma since childhood, several environmental allergies, GERD, anemia, admitted on 09/26/13 with one week history of worsening dyspnea-recently completed Medrol Dosepak. Admitted for asthma exacerbation.   Assessment/Plan:  1. Asthma exacerbation: Treating empirically with IV Solu-Medrol, bronchodilator nebulizations and oxygen. She gives history of productive cough-will add brief course of oral doxycycline. She also complains of nasal stuffiness-will add saline nasal spray. Continue outpatient followup with allergist. Chest x-ray without acute infiltrate. Nasal stuffiness has improved. Patient feeling much better. Will transition to oral Medrol Dosepak. 2. GERD: Continue Pepcid 3. Chronic anemia: Stable 4. Steroid-induced hyperglycemia: SSI. As per patient, not told to be diabetic.   Code Status: Full Family Communication: None at bedside. Disposition Plan: Home when medically stable-possibly 3/21   Consultants:  None  Procedures:  None  Antibiotics:  Oral doxycycline 3/18 >   Subjective: States that she feels much better and her breathing is almost 85% back to baseline.  Objective: Filed Vitals:   09/29/13 0509 09/29/13 0736 09/29/13 1414 09/29/13 1420  BP: 156/93  148/79   Pulse: 59  85   Temp: 98.2 F (36.8 C)  97.7 F (36.5 C)   TempSrc: Oral  Oral   Resp: 18  20   Height:      Weight:      SpO2: 100% 100% 100% 94%    Intake/Output Summary (Last 24 hours) at 09/29/13 1520 Last data filed at 09/29/13 1355  Gross per 24 hour  Intake    720 ml  Output      0 ml  Net    720 ml   Filed Weights   09/26/13 2055  Weight: 125.556 kg (276 lb 12.8 oz)     Exam:  General exam: Young morbidly obese female sitting comfortably in bed. Respiratory system: Clear to auscultation. No increased work of  breathing. Able to speak in full sentences. Cardiovascular system: S1 & S2 heard, RRR. No JVD, murmurs, gallops, clicks or pedal edema. Telemetry: Sinus rhythm. Occasional brief sinus tachycardia-likely related to activity. Short run of PVCs. Gastrointestinal system: Abdomen is nondistended, soft and nontender. Normal bowel sounds heard. Central nervous system: Alert and oriented. No focal neurological deficits. Extremities: Symmetric 5 x 5 power.   Data Reviewed: Basic Metabolic Panel:  Recent Labs Lab 09/26/13 1844 09/27/13 0353  NA 141 136*  K 3.1* 3.9  CL 105 104  CO2  --  20  GLUCOSE 244* 147*  BUN 6 9  CREATININE 0.70 0.70  CALCIUM  --  9.2   Liver Function Tests: No results found for this basename: AST, ALT, ALKPHOS, BILITOT, PROT, ALBUMIN,  in the last 168 hours No results found for this basename: LIPASE, AMYLASE,  in the last 168 hours No results found for this basename: AMMONIA,  in the last 168 hours CBC:  Recent Labs Lab 09/26/13 1838 09/26/13 1844 09/27/13 0353  WBC 12.9*  --  13.1*  NEUTROABS 12.4*  --   --   HGB 9.8* 12.2 9.5*  HCT 33.0* 36.0 32.4*  MCV 61.7*  --  62.2*  PLT 417*  --  442*   Cardiac Enzymes: No results found for this basename: CKTOTAL, CKMB, CKMBINDEX, TROPONINI,  in the last 168 hours BNP (last 3 results) No results found for this basename: PROBNP,  in the last 8760 hours CBG:  Recent Labs Lab 09/28/13 1144 09/28/13  1704 09/28/13 2256 09/29/13 0738 09/29/13 1145  GLUCAP 105* 146* 160* 125* 141*    Recent Results (from the past 240 hour(s))  MRSA PCR SCREENING     Status: None   Collection Time    09/26/13 10:50 PM      Result Value Ref Range Status   MRSA by PCR NEGATIVE  NEGATIVE Final   Comment:            The GeneXpert MRSA Assay (FDA     approved for NASAL specimens     only), is one component of a     comprehensive MRSA colonization     surveillance program. It is not     intended to diagnose MRSA      infection nor to guide or     monitor treatment for     MRSA infections.        Studies: No results found.      Scheduled Meds: . albuterol  2.5 mg Nebulization Q6H  . budesonide-formoterol  2 puff Inhalation BID  . doxycycline  100 mg Oral Q12H  . famotidine  20 mg Oral BID  . ferrous sulfate  325 mg Oral QPC breakfast  . heparin  5,000 Units Subcutaneous 3 times per day  . insulin aspart  0-9 Units Subcutaneous TID WC  . loratadine  10 mg Oral Daily  . methylPREDNISolone  4 mg Oral PC supper  . [START ON 09/30/2013] methylPREDNISolone  4 mg Oral 3 x daily with food  . [START ON 10/01/2013] methylPREDNISolone  4 mg Oral 4X daily taper  . methylPREDNISolone  8 mg Oral Nightly  . [START ON 09/30/2013] methylPREDNISolone  8 mg Oral Nightly  . montelukast  10 mg Oral QHS  . sodium chloride  3 mL Intravenous Q12H   Continuous Infusions:   Active Problems:   Asthma   Steroid-induced diabetes   Asthma exacerbation    Time spent: 31 minutes    Isiac Breighner, MD, FACP, FHM. [facility] Pager [phone]  If 7PM-7AM, please contact night-coverage [email or web address] Password [email or web address] 09/29/2013, 3:20 PM    LOS: 3 days

## 2013-09-29 NOTE — ED Provider Notes (Signed)
Medical screening examination/treatment/procedure(s) were conducted as a shared visit with non-physician practitioner(s) and myself.  I personally evaluated the patient during the encounter. Please see my previous note.      Alfonzo Feller, DO 09/29/13 1241

## 2013-09-30 LAB — GLUCOSE, CAPILLARY
GLUCOSE-CAPILLARY: 103 mg/dL — AB (ref 70–99)
Glucose-Capillary: 86 mg/dL (ref 70–99)

## 2013-09-30 MED ORDER — SALINE SPRAY 0.65 % NA SOLN: 1.0000 | NASAL | Status: DC | PRN

## 2013-09-30 MED ORDER — METHYLPREDNISOLONE (PAK) 4 MG PO TABS: ORAL_TABLET | ORAL | Status: DC

## 2013-09-30 MED ORDER — DOXYCYCLINE HYCLATE 100 MG PO TABS: 100.0000 mg | ORAL_TABLET | Freq: Two times a day (BID) | ORAL | Status: DC

## 2013-09-30 NOTE — Discharge Summary (Signed)
Physician Discharge Summary  [name] JSE:[id number] DOB: May 05, 1976 DOA: 09/26/2013  PCP: Philis Fendt, MD  Admit date: 09/26/2013 Discharge date: 09/30/2013  Time spent: Less than 30 minutes  Recommendations for Outpatient Follow-up:  1. Dr. Nolene Ebbs, PCP on 10/05/13. Patient has a prior appointment.  Discharge Diagnoses:  Active Problems:   Asthma   Steroid-induced diabetes   Asthma exacerbation   Discharge Condition: Improved & Stable  Diet recommendation: Heart healthy and diabetic diet.  Filed Weights   09/26/13 2055  Weight: 125.556 kg (276 lb 12.8 oz)    History of present illness:  38 year old female with history of asthma since childhood, several environmental allergies, GERD, anemia, admitted on 09/26/13 with one week history of worsening dyspnea-recently completed Medrol Dosepak. Admitted for asthma exacerbation.  Hospital Course:   1. Asthma exacerbation: Treating empirically with IV Solu-Medrol, bronchodilator nebulizations and oxygen. She gives history of productive cough- added brief course of oral doxycycline. She also complained of nasal stuffiness-will add saline nasal spray. Continue outpatient followup with allergist. Chest x-ray without acute infiltrate. Patient has improved and her dyspnea, even with activity has resolved. She was transitioned to oral Medrol Dosepak on 3/20-will complete same as OP. She will complete a total of 5 days of oral doxycycline. Has followup appointment with PCP on 3/26. Avoid precipitants. 2. GERD: Continue Pepcid 3. Chronic anemia: Stable 4. Steroid-induced hyperglycemia: SSI. As per patient, not told to be diabetic.   Consultations:  None  Procedures:  None    Discharge Exam:  Complaints:  Denies dyspnea. Was able to shower without difficulty or dyspnea. No chest pain reported.  Filed Vitals:   09/29/13 2047 09/30/13 0524 09/30/13 1126 09/30/13 1341  BP: 130/68 138/73    Pulse: 90 55    Temp: 98 F  (36.7 C) 97.8 F (36.6 C)    TempSrc: Oral Oral    Resp: 20 20    Height:      Weight:      SpO2: 95% 98% 98% 98%    General exam: Young morbidly obese female sitting comfortably in bed.  Respiratory system: Clear to auscultation. No increased work of breathing. Able to speak in full sentences.  Cardiovascular system: S1 & S2 heard, RRR. No JVD, murmurs, gallops, clicks or pedal edema.  Gastrointestinal system: Abdomen is nondistended, soft and nontender. Normal bowel sounds heard.  Central nervous system: Alert and oriented. No focal neurological deficits.  Extremities: Symmetric 5 x 5 power.   Discharge Instructions      Discharge Orders   Future Orders Complete By Expires   Call MD for:  difficulty breathing, headache or visual disturbances  As directed    Call MD for:  severe uncontrolled pain  As directed    Diet - low sodium heart healthy  As directed    Diet Carb Modified  As directed    Increase activity slowly  As directed        Medication List         albuterol 108 (90 BASE) MCG/ACT inhaler  Commonly known as:  PROVENTIL HFA;VENTOLIN HFA  Inhale 2 puffs into the lungs every 6 (six) hours as needed. For wheezing     albuterol (2.5 MG/3ML) 0.083% nebulizer solution  Commonly known as:  PROVENTIL  Take 2.5 mg by nebulization every 6 (six) hours as needed. For wheezing     budesonide-formoterol 160-4.5 MCG/ACT inhaler  Commonly known as:  SYMBICORT  Inhale 2 puffs into the lungs 2 (two) times daily.  cetirizine 10 MG tablet  Commonly known as:  ZYRTEC  Take 10 mg by mouth daily.     doxycycline 100 MG tablet  Commonly known as:  VIBRA-TABS  Take 1 tablet (100 mg total) by mouth every 12 (twelve) hours.     ferrous sulfate 325 (65 FE) MG tablet  Take 325 mg by mouth daily with breakfast.     ibuprofen 600 MG tablet  Commonly known as:  ADVIL,MOTRIN  Take 1 tablet (600 mg total) by mouth every 6 (six) hours as needed for pain (mild pain).      methylPREDNIsolone 4 MG tablet  Commonly known as:  MEDROL DOSPACK  follow package directions     montelukast 10 MG tablet  Commonly known as:  SINGULAIR  Take 10 mg by mouth at bedtime.     ranitidine 150 MG capsule  Commonly known as:  ZANTAC  Take 150 mg by mouth 2 (two) times daily as needed for heartburn.     sodium chloride 0.65 % Soln nasal spray  Commonly known as:  OCEAN  Place 1 spray into both nostrils as needed for congestion.       Follow-up Information   Follow up with Philis Fendt, MD On 10/05/2013. (Keep prior appointment.)    Specialty:  Internal Medicine   Contact information:   [facility] [place] [address] [phone]        The results of significant diagnostics from this hospitalization (including imaging, microbiology, ancillary and laboratory) are listed below for reference.    Significant Diagnostic Studies: Dg Chest 2 View  09/26/2013   CLINICAL DATA:  Asthma, shortness of breath, post breathing treatment  EXAM: CHEST  2 VIEW  COMPARISON:  05/25/2012  FINDINGS: Upper normal heart size.  Normal mediastinal contours and pulmonary vascularity.  Peribronchial thickening and slight accentuation of interstitial markings diffusely, little changed.  No segmental consolidation, pleural effusion or pneumothorax.  Bones unremarkable.  IMPRESSION: Chronic bronchitic changes.  Scattered interstitial prominence, little changed from previous exam.  No acute infiltrate.   Electronically Signed   By: Lavonia Dana M.D.   On: 09/26/2013 17:52    Microbiology: Recent Results (from the past 240 hour(s))  MRSA PCR SCREENING     Status: None   Collection Time    09/26/13 10:50 PM      Result Value Ref Range Status   MRSA by PCR NEGATIVE  NEGATIVE Final   Comment:            The GeneXpert MRSA Assay (FDA     approved for NASAL specimens     only), is one component of a     comprehensive MRSA colonization     surveillance program. It is  not     intended to diagnose MRSA     infection nor to guide or     monitor treatment for     MRSA infections.     Labs: Basic Metabolic Panel:  Recent Labs Lab 09/26/13 1844 09/27/13 0353  NA 141 136*  K 3.1* 3.9  CL 105 104  CO2  --  20  GLUCOSE 244* 147*  BUN 6 9  CREATININE 0.70 0.70  CALCIUM  --  9.2   Liver Function Tests: No results found for this basename: AST, ALT, ALKPHOS, BILITOT, PROT, ALBUMIN,  in the last 168 hours No results found for this basename: LIPASE, AMYLASE,  in the last 168 hours No results found for this basename: AMMONIA,  in the last 168 hours CBC:  Recent Labs Lab 09/26/13 1838 09/26/13 1844 09/27/13 0353  WBC 12.9*  --  13.1*  NEUTROABS 12.4*  --   --   HGB 9.8* 12.2 9.5*  HCT 33.0* 36.0 32.4*  MCV 61.7*  --  62.2*  PLT 417*  --  442*   Cardiac Enzymes: No results found for this basename: CKTOTAL, CKMB, CKMBINDEX, TROPONINI,  in the last 168 hours BNP: BNP (last 3 results) No results found for this basename: PROBNP,  in the last 8760 hours CBG:  Recent Labs Lab 09/29/13 1145 09/29/13 1707 09/29/13 2118 09/30/13 0851 09/30/13 1209  GLUCAP 141* 133* 128* 86 103*    Additional labs: 1. Hemoglobin A1c 5.8 2. Urine pregnancy test was requested but patient did not provide a sample and stated that she was not sexually active.   Signed:  Vernell Leep, MD, FACP, FHM. [facility] Pager [phone]  If 7PM-7AM, please contact night-coverage [email or web address] Password [email or web address] 09/30/2013, 3:53 PM

## 2013-09-30 NOTE — Progress Notes (Signed)
Discharge to home, alert and oriented. D/c instructions and follow up appointments done and was given to the patient. PIV removed no s/s of infiltration or swelling noted.

## 2013-12-04 ENCOUNTER — Emergency Department (HOSPITAL_COMMUNITY): Payer: Medicare Other

## 2013-12-04 ENCOUNTER — Inpatient Hospital Stay (HOSPITAL_COMMUNITY)
Admission: EM | Admit: 2013-12-04 | Discharge: 2013-12-07 | DRG: 202 | Disposition: A | Discharge status: Home / Self Care | Payer: Medicare Other | Attending: Internal Medicine | Admitting: Internal Medicine

## 2013-12-04 ENCOUNTER — Encounter (HOSPITAL_COMMUNITY): Payer: Self-pay | Admitting: Emergency Medicine

## 2013-12-04 DIAGNOSIS — Z79899 Other long term (current) drug therapy: Secondary | ICD-10-CM | POA: Diagnosis not present

## 2013-12-04 DIAGNOSIS — J45909 Unspecified asthma, uncomplicated: Secondary | ICD-10-CM

## 2013-12-04 DIAGNOSIS — Z9089 Acquired absence of other organs: Secondary | ICD-10-CM | POA: Diagnosis not present

## 2013-12-04 DIAGNOSIS — I10 Essential (primary) hypertension: Secondary | ICD-10-CM | POA: Diagnosis present

## 2013-12-04 DIAGNOSIS — Z833 Family history of diabetes mellitus: Secondary | ICD-10-CM | POA: Diagnosis not present

## 2013-12-04 DIAGNOSIS — Z6841 Body Mass Index (BMI) 40.0 and over, adult: Secondary | ICD-10-CM

## 2013-12-04 DIAGNOSIS — R0602 Shortness of breath: Secondary | ICD-10-CM | POA: Diagnosis present

## 2013-12-04 DIAGNOSIS — Z8249 Family history of ischemic heart disease and other diseases of the circulatory system: Secondary | ICD-10-CM | POA: Diagnosis not present

## 2013-12-04 DIAGNOSIS — Z91038 Other insect allergy status: Secondary | ICD-10-CM | POA: Diagnosis not present

## 2013-12-04 DIAGNOSIS — J45901 Unspecified asthma with (acute) exacerbation: Secondary | ICD-10-CM | POA: Diagnosis present

## 2013-12-04 DIAGNOSIS — Z888 Allergy status to other drugs, medicaments and biological substances status: Secondary | ICD-10-CM

## 2013-12-04 DIAGNOSIS — K219 Gastro-esophageal reflux disease without esophagitis: Secondary | ICD-10-CM | POA: Diagnosis present

## 2013-12-04 LAB — CBC WITH DIFFERENTIAL/PLATELET
BASOS PCT: 0 % (ref 0–1)
Basophils Absolute: 0 10*3/uL (ref 0.0–0.1)
EOS ABS: 1.8 10*3/uL — AB (ref 0.0–0.7)
Eosinophils Relative: 11 % — ABNORMAL HIGH (ref 0–5)
HCT: 33.7 % — ABNORMAL LOW (ref 36.0–46.0)
Hemoglobin: 10 g/dL — ABNORMAL LOW (ref 12.0–15.0)
LYMPHS ABS: 2.2 10*3/uL (ref 0.7–4.0)
Lymphocytes Relative: 13 % (ref 12–46)
MCH: 19 pg — AB (ref 26.0–34.0)
MCHC: 29.7 g/dL — AB (ref 30.0–36.0)
MCV: 63.9 fL — AB (ref 78.0–100.0)
MONO ABS: 1 10*3/uL (ref 0.1–1.0)
Monocytes Relative: 6 % (ref 3–12)
NEUTROS PCT: 70 % (ref 43–77)
Neutro Abs: 11.8 10*3/uL — ABNORMAL HIGH (ref 1.7–7.7)
PLATELETS: 360 10*3/uL (ref 150–400)
RBC: 5.27 MIL/uL — ABNORMAL HIGH (ref 3.87–5.11)
RDW: 19.1 % — ABNORMAL HIGH (ref 11.5–15.5)
WBC: 16.8 10*3/uL — ABNORMAL HIGH (ref 4.0–10.5)

## 2013-12-04 LAB — BASIC METABOLIC PANEL
BUN: 8 mg/dL (ref 6–23)
CALCIUM: 9 mg/dL (ref 8.4–10.5)
CO2: 25 mEq/L (ref 19–32)
Chloride: 100 mEq/L (ref 96–112)
Creatinine, Ser: 0.7 mg/dL (ref 0.50–1.10)
GFR calc Af Amer: >90 mL/min (ref >90)
Glucose, Bld: 96 mg/dL (ref 70–99)
Potassium: 3.8 mEq/L (ref 3.7–5.3)
SODIUM: 137 meq/L (ref 137–147)

## 2013-12-04 MED ORDER — ALBUTEROL SULFATE (2.5 MG/3ML) 0.083% IN NEBU
2.5000 mg | INHALATION_SOLUTION | Freq: Four times a day (QID) | RESPIRATORY_TRACT | Status: DC
  Administered 2013-12-04: 2.5 mg via RESPIRATORY_TRACT
  Filled 2013-12-04: qty 3

## 2013-12-04 MED ORDER — GUAIFENESIN ER 600 MG PO TB12
600.0000 mg | ORAL_TABLET | Freq: Two times a day (BID) | ORAL | Status: DC
  Administered 2013-12-04 – 2013-12-07 (×6): 600 mg via ORAL
  Filled 2013-12-04 (×7): qty 1

## 2013-12-04 MED ORDER — METHYLPREDNISOLONE SODIUM SUCC 125 MG IJ SOLR
125.0000 mg | Freq: Once | INTRAMUSCULAR | Status: AC
  Administered 2013-12-04: 125 mg via INTRAVENOUS
  Filled 2013-12-04: qty 2

## 2013-12-04 MED ORDER — IPRATROPIUM BROMIDE 0.02 % IN SOLN
0.5000 mg | Freq: Once | RESPIRATORY_TRACT | Status: AC
  Administered 2013-12-04: 0.5 mg via RESPIRATORY_TRACT
  Filled 2013-12-04: qty 2.5

## 2013-12-04 MED ORDER — BUDESONIDE-FORMOTEROL FUMARATE 160-4.5 MCG/ACT IN AERO
2.0000 | INHALATION_SPRAY | Freq: Two times a day (BID) | RESPIRATORY_TRACT | Status: DC
  Administered 2013-12-04 – 2013-12-07 (×6): 2 via RESPIRATORY_TRACT
  Filled 2013-12-04: qty 6

## 2013-12-04 MED ORDER — ALBUTEROL SULFATE (2.5 MG/3ML) 0.083% IN NEBU: 5.0000 mg | INHALATION_SOLUTION | RESPIRATORY_TRACT | Status: DC | PRN

## 2013-12-04 MED ORDER — LORATADINE 10 MG PO TABS
10.0000 mg | ORAL_TABLET | Freq: Every day | ORAL | Status: DC
  Administered 2013-12-04 – 2013-12-07 (×4): 10 mg via ORAL
  Filled 2013-12-04 (×4): qty 1

## 2013-12-04 MED ORDER — ONDANSETRON HCL 4 MG/2ML IJ SOLN: 4.0000 mg | Freq: Four times a day (QID) | INTRAMUSCULAR | Status: DC | PRN

## 2013-12-04 MED ORDER — ONDANSETRON HCL 4 MG/2ML IJ SOLN
4.0000 mg | Freq: Once | INTRAMUSCULAR | Status: AC
  Administered 2013-12-04: 4 mg via INTRAVENOUS
  Filled 2013-12-04: qty 2

## 2013-12-04 MED ORDER — ALBUTEROL SULFATE (2.5 MG/3ML) 0.083% IN NEBU
5.0000 mg | INHALATION_SOLUTION | Freq: Once | RESPIRATORY_TRACT | Status: AC
  Administered 2013-12-04: 5 mg via RESPIRATORY_TRACT
  Filled 2013-12-04: qty 6

## 2013-12-04 MED ORDER — METHYLPREDNISOLONE SODIUM SUCC 125 MG IJ SOLR
60.0000 mg | Freq: Four times a day (QID) | INTRAMUSCULAR | Status: DC
  Administered 2013-12-04 – 2013-12-06 (×7): 60 mg via INTRAVENOUS
  Filled 2013-12-04 (×11): qty 0.96

## 2013-12-04 MED ORDER — OXYCODONE-ACETAMINOPHEN 5-325 MG PO TABS
1.0000 | ORAL_TABLET | Freq: Once | ORAL | Status: AC
  Administered 2013-12-04: 1 via ORAL
  Filled 2013-12-04: qty 1

## 2013-12-04 MED ORDER — SODIUM CHLORIDE 0.9 % IJ SOLN
3.0000 mL | Freq: Two times a day (BID) | INTRAMUSCULAR | Status: DC
  Administered 2013-12-04 – 2013-12-05 (×2): 3 mL via INTRAVENOUS

## 2013-12-04 MED ORDER — SODIUM CHLORIDE 0.9 % IJ SOLN: 3.0000 mL | INTRAMUSCULAR | Status: DC | PRN

## 2013-12-04 MED ORDER — ENOXAPARIN SODIUM 60 MG/0.6ML ~~LOC~~ SOLN
60.0000 mg | SUBCUTANEOUS | Status: DC
  Administered 2013-12-04 – 2013-12-06 (×3): 60 mg via SUBCUTANEOUS
  Filled 2013-12-04 (×4): qty 0.6

## 2013-12-04 MED ORDER — OXYCODONE-ACETAMINOPHEN 5-325 MG PO TABS
1.0000 | ORAL_TABLET | ORAL | Status: DC | PRN
  Administered 2013-12-04 – 2013-12-06 (×5): 1 via ORAL
  Filled 2013-12-04 (×5): qty 1

## 2013-12-04 MED ORDER — LEVOFLOXACIN 500 MG PO TABS
500.0000 mg | ORAL_TABLET | Freq: Every day | ORAL | Status: DC
  Administered 2013-12-04 – 2013-12-07 (×4): 500 mg via ORAL
  Filled 2013-12-04 (×4): qty 1

## 2013-12-04 MED ORDER — MONTELUKAST SODIUM 10 MG PO TABS
10.0000 mg | ORAL_TABLET | Freq: Every day | ORAL | Status: DC
  Administered 2013-12-04 – 2013-12-06 (×3): 10 mg via ORAL
  Filled 2013-12-04 (×4): qty 1

## 2013-12-04 MED ORDER — SODIUM CHLORIDE 0.9 % IV SOLN: 250.0000 mL | INTRAVENOUS | Status: DC | PRN

## 2013-12-04 MED ORDER — ALBUTEROL (5 MG/ML) CONTINUOUS INHALATION SOLN
10.0000 mg/h | INHALATION_SOLUTION | Freq: Once | RESPIRATORY_TRACT | Status: AC
  Administered 2013-12-04: 10 mg/h via RESPIRATORY_TRACT
  Filled 2013-12-04: qty 20

## 2013-12-04 MED ORDER — ONDANSETRON HCL 4 MG PO TABS: 4.0000 mg | ORAL_TABLET | Freq: Four times a day (QID) | ORAL | Status: DC | PRN

## 2013-12-04 MED ORDER — IPRATROPIUM-ALBUTEROL 0.5-2.5 (3) MG/3ML IN SOLN
3.0000 mL | Freq: Four times a day (QID) | RESPIRATORY_TRACT | Status: DC
  Administered 2013-12-05: 3 mL via RESPIRATORY_TRACT
  Filled 2013-12-04: qty 3

## 2013-12-04 MED ORDER — ALBUTEROL SULFATE (2.5 MG/3ML) 0.083% IN NEBU: 2.5000 mg | INHALATION_SOLUTION | RESPIRATORY_TRACT | Status: DC | PRN

## 2013-12-04 MED ORDER — IPRATROPIUM BROMIDE 0.02 % IN SOLN
0.5000 mg | Freq: Four times a day (QID) | RESPIRATORY_TRACT | Status: DC
  Administered 2013-12-04: 0.5 mg via RESPIRATORY_TRACT
  Filled 2013-12-04: qty 2.5

## 2013-12-04 NOTE — ED Notes (Signed)
Pt had episode of emesis during breathing tx after solu-medrol given. Denies any nausea prior to treatment.

## 2013-12-04 NOTE — ED Provider Notes (Signed)
CSN: [id number]     Arrival date & time 12/04/13  1222 History   First MD Initiated Contact with Patient 12/04/13 1234     Chief Complaint  Patient presents with  . Shortness of Breath     (Consider location/radiation/quality/duration/timing/severity/associated sxs/prior Treatment) HPI  Pt reports SOB, wheezing that began 4 days ago after taking daughter to the park.  Does have environmental allergies.  Pain is chest only with coughing.  Had temperature to 100.9 3 days ago.  Is taking all of her chronic asthma medications.  States this feels like asthma exacerbation.  Has been hospitalized but never intubated for asthma in the past.  Denies leg swelling, no hx blood clots.  Does have hx symptomatic anemia requiring blood transfusions.    Past Medical History  Diagnosis Date  . Asthma   . Environmental allergies   . Fibroids   . Shortness of breath   . GERD (gastroesophageal reflux disease)   . Anemia   . Fibroid, uterine   . Menorrhagia   . Blood transfusion without reported diagnosis    Past Surgical History  Procedure Laterality Date  . Ectopic pregnancy surgery    . Myomectomy    . Cholecystectomy      06/29/2011  . Cholecystectomy  06/27/2011    Procedure: LAPAROSCOPIC CHOLECYSTECTOMY WITH INTRAOPERATIVE CHOLANGIOGRAM;  Surgeon: Gayland Curry, MD;  Location: [place];  Service: General;  Laterality: N/A;  . Cholecystectomy  06/29/2011    Procedure: LAPAROSCOPIC CHOLECYSTECTOMY WITH INTRAOPERATIVE CHOLANGIOGRAM;  Surgeon: Merrie Roof, MD;  Location: [place];  Service: General;  Laterality: N/A;  . Ercp  06/30/2011    Procedure: ENDOSCOPIC RETROGRADE CHOLANGIOPANCREATOGRAPHY (ERCP);  Surgeon: Jeryl Columbia, MD;  Location: [facility] ENDOSCOPY;  Service: Endoscopy;  Laterality: N/A;  probable sphincterotomy  . Ercp  07/03/2011    Procedure: ENDOSCOPIC RETROGRADE CHOLANGIOPANCREATOGRAPHY (ERCP);  Surgeon: Landry Dyke, MD;  Location: [place];  Service: Endoscopy;  Laterality: N/A;   PROPOFOL  . Myomectomy N/A 11/14/2012    Procedure: MYOMECTOMY;  Surgeon: Osborne Oman, MD;  Location: [facility];  Service: Gynecology;  Laterality: N/A;   Family History  Problem Relation Age of Onset  . Anesthesia problems Neg Hx   . Hypotension Neg Hx   . Malignant hyperthermia Neg Hx   . Pseudochol deficiency Neg Hx   . Hypertension Mother   . Diabetes Mother   . Hypertension Father   . Hypertension Brother    History  Substance Use Topics  . Smoking status: Never Smoker   . Smokeless tobacco: Never Used  . Alcohol Use: No   OB History   Grav Para Term Preterm Abortions TAB SAB Ect Mult Living   1    1   1        Review of Systems  All other systems reviewed and are negative.     Allergies  Bee venom and Prednisone  Home Medications   Prior to Admission medications   Medication Sig Start Date End Date Taking? Authorizing Provider  albuterol (PROVENTIL HFA;VENTOLIN HFA) 108 (90 BASE) MCG/ACT inhaler Inhale 2 puffs into the lungs every 6 (six) hours as needed. For wheezing    Historical Provider, MD  albuterol (PROVENTIL) (2.5 MG/3ML) 0.083% nebulizer solution Take 2.5 mg by nebulization every 6 (six) hours as needed. For wheezing    Historical Provider, MD  budesonide-formoterol (SYMBICORT) 160-4.5 MCG/ACT inhaler Inhale 2 puffs into the lungs 2 (two) times daily.      Historical  Provider, MD  cetirizine (ZYRTEC) 10 MG tablet Take 10 mg by mouth daily.    Historical Provider, MD  doxycycline (VIBRA-TABS) 100 MG tablet Take 1 tablet (100 mg total) by mouth every 12 (twelve) hours. 09/30/13   Modena Jansky, MD  ferrous sulfate 325 (65 FE) MG tablet Take 325 mg by mouth daily with breakfast.    Historical Provider, MD  ibuprofen (ADVIL,MOTRIN) 600 MG tablet Take 1 tablet (600 mg total) by mouth every 6 (six) hours as needed for pain (mild pain). 11/18/12   Osborne Oman, MD  methylPREDNIsolone (MEDROL DOSPACK) 4 MG tablet follow package directions 09/30/13   Modena Jansky, MD  montelukast (SINGULAIR) 10 MG tablet Take 10 mg by mouth at bedtime.      Historical Provider, MD  ranitidine (ZANTAC) 150 MG capsule Take 150 mg by mouth 2 (two) times daily as needed for heartburn.     Historical Provider, MD  sodium chloride (OCEAN) 0.65 % SOLN nasal spray Place 1 spray into both nostrils as needed for congestion. 09/30/13   Modena Jansky, MD   BP 129/79  Pulse 104  Temp(Src) 98.5 F (36.9 C) (Oral)  Resp 25  SpO2 96%  LMP 11/29/2013 Physical Exam  Nursing note and vitals reviewed. Constitutional: She appears well-developed and well-nourished. No distress.  Cardiovascular: Normal rate and regular rhythm.   Pulmonary/Chest: Accessory muscle usage present. Tachypnea noted. She has decreased breath sounds. She has wheezes. She has no rhonchi. She has no rales. She exhibits no tenderness.  Abdominal: Soft. She exhibits no distension. There is no tenderness. There is no rebound and no guarding.  Neurological: She is alert.  Skin: She is not diaphoretic.    ED Course  Procedures (including critical care time) Labs Review Labs Reviewed  CBC WITH DIFFERENTIAL - Abnormal; Notable for the following:    WBC 16.8 (*)    RBC 5.27 (*)    Hemoglobin 10.0 (*)    HCT 33.7 (*)    MCV 63.9 (*)    MCH 19.0 (*)    MCHC 29.7 (*)    RDW 19.1 (*)    Eosinophils Relative 11 (*)    Neutro Abs 11.8 (*)    Eosinophils Absolute 1.8 (*)    All other components within normal limits  BASIC METABOLIC PANEL    Imaging Review Dg Chest 2 View  12/04/2013   CLINICAL DATA:  Short of breath.  EXAM: CHEST  2 VIEW  COMPARISON:  09/26/2013  FINDINGS: The heart size and mediastinal contours are within normal limits. Both lungs are clear. The visualized skeletal structures are unremarkable.  IMPRESSION: No active cardiopulmonary disease.   Electronically Signed   By: Lajean Manes M.D.   On: 12/04/2013 15:07     EKG Interpretation None       Date: 12/04/2013  Rate: 105   Rhythm: sinus tachycardia  QRS Axis: normal  Intervals: normal  ST/T Wave abnormalities: nonspecific ST/T changes  Conduction Disutrbances:none  Narrative Interpretation: biatrial enlargement  Old EKG Reviewed: none available    Pt denies allergy to solu-medrol.    2:40 PM Some improvement after 2nd neb (hour long continuous).  Air movement has improved but with continued loud expiratory wheezing.  O2 drops to 92% on room air.   Plan for obs admission to hospital.    MDM   Final diagnoses:  Asthma exacerbation    Pt with hx asthma, exacerbation x 4 days after exposure to pollen at park.  Mild improvement with nebs  though O2 sat down to 92% after nebs.  Admitted for observation.  CXR negative.    2:51 PM Admitted to Dr Darrick Meigs, [facility].   [place], PA-C 12/04/13 1512

## 2013-12-04 NOTE — ED Notes (Signed)
Bed: LF81 Expected date:  Expected time:  Means of arrival:  Comments: triage 1

## 2013-12-04 NOTE — ED Provider Notes (Signed)
Medical screening examination/treatment/procedure(s) were performed by non-physician practitioner and as supervising physician I was immediately available for consultation/collaboration.    EKG Interpretation  Date/Time:  Monday Dec 04 2013 12:28:46 EDT Ventricular Rate:  105 PR Interval:  127 QRS Duration: 91 QT Interval:  337 QTC Calculation: 445 R Axis:   68 Text Interpretation:  Sinus tachycardia Biatrial enlargement Borderline repolarization abnormality No significant change since last tracing Confirmed by Charliegh Vasudevan  MD-J, Sendy Pluta ([id number]) on 12/05/2013 7:06:50 AM        Dorie Rank, MD 12/05/13 [phone]

## 2013-12-04 NOTE — ED Provider Notes (Signed)
Medical screening examination/treatment/procedure(s) were performed by non-physician practitioner and as supervising physician I was immediately available for consultation/collaboration.    Lorinda Copland, MD 12/04/13 1534 

## 2013-12-04 NOTE — ED Notes (Signed)
Pt c/o SOB since yesterday, has been doing treatments at home w/o relief. Denies pain.

## 2013-12-04 NOTE — H&P (Signed)
PCP:   Philis Fendt, MD   Chief Complaint:  Shortness of breath  HPI:  38 year old female who  has a past medical history of Asthma; Environmental allergies; Fibroids; Shortness of breath; GERD (gastroesophageal reflux disease); Anemia; Fibroid, uterine; Menorrhagia; and Blood transfusion without reported diagnosis. Presents to the ED with chief complaint of shortness of breath, wheezing, nasal congestion, headache started 4 days ago after patient took her daughter to the park.. patient has history of allergies and has been taking multiple medications at home. As per patient she was taking inhalers every 2 hours and would give relief for some time and then wheezing and shortness of breath comes backt. She also has been coughing but is not coughing any phlegm. She denies fever, she endorses headache, had 1 episode of vomiting denies diarrhea. Patient was diagnosed with asthma since childhood. In the ED patient received Solu-Medrol, DuoNeb nebulizers and is breathing better.  Allergies:   Allergies  Allergen Reactions  . Bee Venom Anaphylaxis    Patient has an epi pen, also has not been stung before but was informed she is allergic  . Prednisone Hives    Patient has been rechallenged with prednisone and experienced the same symptoms of hives and face swelling.  She can tolerate methylprednisolone IV and PO.      Past Medical History  Diagnosis Date  . Asthma   . Environmental allergies   . Fibroids   . Shortness of breath   . GERD (gastroesophageal reflux disease)   . Anemia   . Fibroid, uterine   . Menorrhagia   . Blood transfusion without reported diagnosis     Past Surgical History  Procedure Laterality Date  . Ectopic pregnancy surgery    . Myomectomy    . Cholecystectomy      06/29/2011  . Cholecystectomy  06/27/2011    Procedure: LAPAROSCOPIC CHOLECYSTECTOMY WITH INTRAOPERATIVE CHOLANGIOGRAM;  Surgeon: Gayland Curry, MD;  Location: [place];  Service: General;   Laterality: N/A;  . Cholecystectomy  06/29/2011    Procedure: LAPAROSCOPIC CHOLECYSTECTOMY WITH INTRAOPERATIVE CHOLANGIOGRAM;  Surgeon: Merrie Roof, MD;  Location: [place];  Service: General;  Laterality: N/A;  . Ercp  06/30/2011    Procedure: ENDOSCOPIC RETROGRADE CHOLANGIOPANCREATOGRAPHY (ERCP);  Surgeon: Jeryl Columbia, MD;  Location: [facility] ENDOSCOPY;  Service: Endoscopy;  Laterality: N/A;  probable sphincterotomy  . Ercp  07/03/2011    Procedure: ENDOSCOPIC RETROGRADE CHOLANGIOPANCREATOGRAPHY (ERCP);  Surgeon: Landry Dyke, MD;  Location: [place];  Service: Endoscopy;  Laterality: N/A;  PROPOFOL  . Myomectomy N/A 11/14/2012    Procedure: MYOMECTOMY;  Surgeon: Osborne Oman, MD;  Location: [facility];  Service: Gynecology;  Laterality: N/A;    Prior to Admission medications   Medication Sig Start Date End Date Taking? Authorizing Provider  albuterol (PROVENTIL HFA;VENTOLIN HFA) 108 (90 BASE) MCG/ACT inhaler Inhale 2 puffs into the lungs every 6 (six) hours as needed for wheezing or shortness of breath.    Yes Historical Provider, MD  albuterol (PROVENTIL) (2.5 MG/3ML) 0.083% nebulizer solution Take 2.5 mg by nebulization every 6 (six) hours as needed for wheezing or shortness of breath.    Yes Historical Provider, MD  budesonide-formoterol (SYMBICORT) 160-4.5 MCG/ACT inhaler Inhale 2 puffs into the lungs 2 (two) times daily.     Yes Historical Provider, MD  cetirizine (ZYRTEC) 10 MG tablet Take 10 mg by mouth every evening.    Yes Historical Provider, MD  ferrous sulfate 325 (65 FE) MG tablet Take  325 mg by mouth daily with breakfast.   Yes Historical Provider, MD  ibuprofen (ADVIL,MOTRIN) 600 MG tablet Take 600 mg by mouth every 6 (six) hours as needed for mild pain.   Yes Historical Provider, MD  montelukast (SINGULAIR) 10 MG tablet Take 10 mg by mouth at bedtime.     Yes Historical Provider, MD  ranitidine (ZANTAC) 150 MG capsule Take 150 mg by mouth 2 (two) times daily as needed for heartburn.     Yes Historical Provider, MD    Social History:  reports that she has never smoked. She has never used smokeless tobacco. She reports that she does not drink alcohol or use illicit drugs.  Family History  Problem Relation Age of Onset  . Anesthesia problems Neg Hx   . Hypotension Neg Hx   . Malignant hyperthermia Neg Hx   . Pseudochol deficiency Neg Hx   . Hypertension Mother   . Diabetes Mother   . Hypertension Father   . Hypertension Brother      All the positives are listed in BOLD  Review of Systems:  HEENT: Headache, blurred vision, runny nose, sore throat Neck: Hypothyroidism, hyperthyroidism,,lymphadenopathy Chest : Shortness of breath, history of COPD, Asthma Heart : Chest pain, history of coronary arterey disease GI:  Nausea, vomiting, diarrhea, constipation, GERD GU: Dysuria, urgency, frequency of urination, hematuria Neuro: Stroke, seizures, syncope Psych: Depression, anxiety, hallucinations   Physical Exam: Blood pressure 129/76, pulse 120, temperature 98.5 F (36.9 C), temperature source Oral, resp. rate 20, last menstrual period 11/29/2013, SpO2 94.00%. Constitutional:   Patient is a morbidly obese female* in no acute distress and cooperative with exam. Head: Normocephalic and atraumatic Mouth: Mucus membranes moist Eyes: PERRL, EOMI, conjunctivae normal Neck: Supple, No Thyromegaly Cardiovascular: RRR, S1 normal, S2 normal Pulmonary/Chest: Bilateral rhonchi Abdominal: Soft. Non-tender, non-distended, bowel sounds are normal, no masses, organomegaly, or guarding present.  Neurological: A&O x3, Strenght is normal and symmetric bilaterally, cranial nerve II-XII are grossly intact, no focal motor deficit, sensory intact to light touch bilaterally.  Extremities : No Cyanosis, Clubbing or Edema  Labs on Admission:  Basic Metabolic Panel:  Recent Labs Lab 12/04/13 1247  NA 137  K 3.8  CL 100  CO2 25  GLUCOSE 96  BUN 8  CREATININE 0.70  CALCIUM 9.0     Liver Function Tests: No results found for this basename: AST, ALT, ALKPHOS, BILITOT, PROT, ALBUMIN,  in the last 168 hours No results found for this basename: LIPASE, AMYLASE,  in the last 168 hours No results found for this basename: AMMONIA,  in the last 168 hours CBC:  Recent Labs Lab 12/04/13 1247  WBC 16.8*  NEUTROABS 11.8*  HGB 10.0*  HCT 33.7*  MCV 63.9*  PLT 360   Cardiac Enzymes: No results found for this basename: CKTOTAL, CKMB, CKMBINDEX, TROPONINI,  in the last 168 hours  BNP (last 3 results) No results found for this basename: PROBNP,  in the last 8760 hours CBG: No results found for this basename: GLUCAP,  in the last 168 hours  Radiological Exams on Admission: Dg Chest 2 View  12/04/2013   CLINICAL DATA:  Short of breath.  EXAM: CHEST  2 VIEW  COMPARISON:  09/26/2013  FINDINGS: The heart size and mediastinal contours are within normal limits. Both lungs are clear. The visualized skeletal structures are unremarkable.  IMPRESSION: No active cardiopulmonary disease.   Electronically Signed   By: Lajean Manes M.D.   On: 12/04/2013 15:07  EKG: Independently reviewed. Sinus tachycardia   Assessment/Plan Principal Problem:   Asthma exacerbation  Asthma exacerbation Patient will be started on DuoNeb nebulizer, Solu-Medrol 60 mg IV every 6 hours, Mucinex DM one tablet by mouth twice a day. Levaquin 500 milligram by mouth daily. If patient improves can be discharged on prednisone taper next 24-48 hours.  Allergies Continue antihistamines Claritin 10 mg by mouth daily  Nausea vomiting Likely due to coughing, will start Zofran when necessary for nausea vomiting.  DVT prophylaxis Lovenox  Code status: Patient is full code  Family discussion: No family at bedside   Time Spent on Admission: 30 min  [facility] Pager: [phone] 12/04/2013, 3:49 PM  If 7PM-7AM, please contact night-coverage  [email or web address]  Password [email or web address]

## 2013-12-05 LAB — COMPREHENSIVE METABOLIC PANEL
ALT: 12 U/L (ref 0–35)
AST: 13 U/L (ref 0–37)
Albumin: 3.1 g/dL — ABNORMAL LOW (ref 3.5–5.2)
Alkaline Phosphatase: 95 U/L (ref 39–117)
BUN: 8 mg/dL (ref 6–23)
CHLORIDE: 100 meq/L (ref 96–112)
CO2: 24 meq/L (ref 19–32)
Calcium: 9.1 mg/dL (ref 8.4–10.5)
Creatinine, Ser: 0.58 mg/dL (ref 0.50–1.10)
GFR calc Af Amer: >90 mL/min (ref >90)
GFR calc non Af Amer: >90 mL/min (ref >90)
Glucose, Bld: 168 mg/dL — ABNORMAL HIGH (ref 70–99)
Potassium: 3.8 mEq/L (ref 3.7–5.3)
SODIUM: 136 meq/L — AB (ref 137–147)
Total Protein: 7.7 g/dL (ref 6.0–8.3)

## 2013-12-05 LAB — CBC
HCT: 34.2 % — ABNORMAL LOW (ref 36.0–46.0)
Hemoglobin: 10.1 g/dL — ABNORMAL LOW (ref 12.0–15.0)
MCH: 18.9 pg — ABNORMAL LOW (ref 26.0–34.0)
MCHC: 29.5 g/dL — ABNORMAL LOW (ref 30.0–36.0)
MCV: 63.9 fL — ABNORMAL LOW (ref 78.0–100.0)
PLATELETS: 435 10*3/uL — AB (ref 150–400)
RBC: 5.35 MIL/uL — AB (ref 3.87–5.11)
RDW: 19.1 % — AB (ref 11.5–15.5)
WBC: 15.3 10*3/uL — ABNORMAL HIGH (ref 4.0–10.5)

## 2013-12-05 MED ORDER — ALBUTEROL SULFATE (2.5 MG/3ML) 0.083% IN NEBU: 2.5000 mg | INHALATION_SOLUTION | RESPIRATORY_TRACT | Status: DC | PRN

## 2013-12-05 MED ORDER — ALBUTEROL SULFATE (2.5 MG/3ML) 0.083% IN NEBU
2.5000 mg | INHALATION_SOLUTION | Freq: Four times a day (QID) | RESPIRATORY_TRACT | Status: DC
  Administered 2013-12-05 (×2): 2.5 mg via RESPIRATORY_TRACT
  Filled 2013-12-05 (×2): qty 3

## 2013-12-05 MED ORDER — POLYETHYLENE GLYCOL 3350 17 G PO PACK
17.0000 g | PACK | Freq: Every day | ORAL | Status: DC | PRN
  Administered 2013-12-05: 17 g via ORAL
  Filled 2013-12-05 (×2): qty 1

## 2013-12-05 MED ORDER — ALBUTEROL SULFATE (2.5 MG/3ML) 0.083% IN NEBU
2.5000 mg | INHALATION_SOLUTION | Freq: Three times a day (TID) | RESPIRATORY_TRACT | Status: DC
  Administered 2013-12-06: 2.5 mg via RESPIRATORY_TRACT
  Filled 2013-12-05: qty 3

## 2013-12-05 NOTE — Progress Notes (Signed)
PROGRESS NOTE   [name] AYT:[id number] DOB: Jun 27, 1976 DOA: 12/04/2013 PCP: Philis Fendt, MD   HPI: Presents to the ED with chief complaint of shortness of breath, wheezing, nasal congestion, headache started 4 days ago after patient took her daughter to the park.. patient has history of allergies and has been taking multiple medications at home. As per patient she was taking inhalers every 2 hours and would give relief for some time and then wheezing and shortness of breath comes backt.   Assessment/Plan: Asthma exacerbation - continue current regimen, she is improving today, still tight with ambulation. Encourage walking.  Allergies Continue antihistamines Claritin 10 mg by mouth daily  Nausea vomiting Likely due to coughing, improved  Diet: regular Fluids: None DVT Prophylaxis: Lovenox  Code Status: Full Family Communication: d/w patient  Disposition Plan: inpatient, home when ready   Consultants:  None  Procedures:  None    Antibiotics Levofloxacin 5/25 >>  HPI/Subjective: Feels better, still with significant wheezing and shorntess of breath with ambulation  Objective: Filed Vitals:   12/04/13 2010 12/04/13 2115 12/05/13 0540 12/05/13 1409  BP:  100/64 152/91 143/90  Pulse:  113 89 97  Temp:  98.1 F (36.7 C) 98 F (36.7 C) 97.4 F (36.3 C)  TempSrc:  Oral Oral Oral  Resp:  20 22 18   Height:      Weight:      SpO2: 97% 95% 96% 98%    Intake/Output Summary (Last 24 hours) at 12/05/13 1419 Last data filed at 12/05/13 1410  Gross per 24 hour  Intake    240 ml  Output      0 ml  Net    240 ml   Filed Weights   12/04/13 1601  Weight: 124 kg (273 lb 5.9 oz)    Exam:   General:  NAD  Cardiovascular: regular rate and rhythm, without MRG  Respiratory: good air movement, diffuse wheezing throughotu  Abdomen: soft, not tender to palpation, positive bowel sounds  MSK: no peripheral edema  Neuro: non focal  Data Reviewed: Basic  Metabolic Panel:  Recent Labs Lab 12/04/13 1247 12/05/13 0425  NA 137 136*  K 3.8 3.8  CL 100 100  CO2 25 24  GLUCOSE 96 168*  BUN 8 8  CREATININE 0.70 0.58  CALCIUM 9.0 9.1   Liver Function Tests:  Recent Labs Lab 12/05/13 0425  AST 13  ALT 12  ALKPHOS 95  BILITOT <0.2*  PROT 7.7  ALBUMIN 3.1*   No results found for this basename: LIPASE, AMYLASE,  in the last 168 hours No results found for this basename: AMMONIA,  in the last 168 hours CBC:  Recent Labs Lab 12/04/13 1247 12/05/13 0425  WBC 16.8* 15.3*  NEUTROABS 11.8*  --   HGB 10.0* 10.1*  HCT 33.7* 34.2*  MCV 63.9* 63.9*  PLT 360 435*   Cardiac Enzymes: No results found for this basename: CKTOTAL, CKMB, CKMBINDEX, TROPONINI,  in the last 168 hours BNP (last 3 results) No results found for this basename: PROBNP,  in the last 8760 hours CBG: No results found for this basename: GLUCAP,  in the last 168 hours  No results found for this or any previous visit (from the past 240 hour(s)).   Studies: Dg Chest 2 View  12/04/2013   CLINICAL DATA:  Short of breath.  EXAM: CHEST  2 VIEW  COMPARISON:  09/26/2013  FINDINGS: The heart size and mediastinal contours are within normal limits. Both lungs are  clear. The visualized skeletal structures are unremarkable.  IMPRESSION: No active cardiopulmonary disease.   Electronically Signed   By: Lajean Manes M.D.   On: 12/04/2013 15:07    Scheduled Meds: . albuterol  2.5 mg Nebulization Q6H WA  . budesonide-formoterol  2 puff Inhalation BID  . enoxaparin (LOVENOX) injection  60 mg Subcutaneous Q24H  . guaiFENesin  600 mg Oral BID  . levofloxacin  500 mg Oral Daily  . loratadine  10 mg Oral Daily  . methylPREDNISolone (SOLU-MEDROL) injection  60 mg Intravenous Q6H  . montelukast  10 mg Oral QHS  . sodium chloride  3 mL Intravenous Q12H   Continuous Infusions:   Principal Problem:   Asthma exacerbation  Time spent: 25  This note has been created with Scientist, clinical (histocompatibility and immunogenetics). Any transcriptional errors are unintentional.   Marzetta Board, MD [facility] Pager [phone]. If 7 PM - 7 AM, please contact night-coverage at [email or web address], password [facility] 12/05/2013, 2:19 PM  LOS: 1 day

## 2013-12-06 MED ORDER — METHYLPREDNISOLONE SODIUM SUCC 125 MG IJ SOLR
60.0000 mg | Freq: Two times a day (BID) | INTRAMUSCULAR | Status: DC
  Administered 2013-12-06 – 2013-12-07 (×2): 60 mg via INTRAVENOUS
  Filled 2013-12-06 (×4): qty 0.96

## 2013-12-06 MED ORDER — ALBUTEROL SULFATE (2.5 MG/3ML) 0.083% IN NEBU: 2.5000 mg | INHALATION_SOLUTION | RESPIRATORY_TRACT | Status: DC | PRN

## 2013-12-06 MED ORDER — IPRATROPIUM-ALBUTEROL 0.5-2.5 (3) MG/3ML IN SOLN
3.0000 mL | RESPIRATORY_TRACT | Status: DC
  Administered 2013-12-06 – 2013-12-07 (×7): 3 mL via RESPIRATORY_TRACT
  Filled 2013-12-06 (×7): qty 3

## 2013-12-06 NOTE — Progress Notes (Signed)
PROGRESS NOTE   [name] MPN:[id number] DOB: [birth year] DOA: 12/04/2013 PCP: Philis Fendt, MD   HPI: Presents to the ED with chief complaint of shortness of breath, wheezing, nasal congestion, headache started 4 days ago after patient took her daughter to the park.. patient has history of allergies and has been taking multiple medications at home. As per patient she was taking inhalers every 2 hours and would give relief for some time and then wheezing and shortness of breath comes back.  Assessment/Plan: Asthma exacerbation - improving, will start tapering steroids today. Continue with symbicort, albuterol   Allergies Continue antihistamines Claritin 10 mg by mouth daily  Nausea vomiting Likely due to coughing, improved  Diet: regular Fluids: None DVT Prophylaxis: Lovenox  Code Status: Full Family Communication: d/w patient  Disposition Plan: inpatient, home when ready   Consultants:  None  Procedures:  None    Antibiotics Levofloxacin 5/25 >>  HPI/Subjective: Feels better, still with significant wheezing and shorntess of breath with ambulation  Objective: Filed Vitals:   12/05/13 1409 12/05/13 2012 12/06/13 0600 12/06/13 0858  BP: 143/90  159/93   Pulse: 97  107   Temp: 97.4 F (36.3 C)  97.7 F (36.5 C)   TempSrc: Oral  Oral   Resp: 18  22   Height:      Weight:      SpO2: 98% 95% 96% 95%    Intake/Output Summary (Last 24 hours) at 12/06/13 0916 Last data filed at 12/06/13 0200  Gross per 24 hour  Intake    360 ml  Output      0 ml  Net    360 ml   Filed Weights   12/04/13 1601  Weight: 124 kg (273 lb 5.9 oz)    Exam:   General:  NAD  Cardiovascular: regular rate and rhythm, without MRG  Respiratory: good air movement, diffuse wheezing throughout  Abdomen: soft, not tender to palpation, positive bowel sounds  MSK: no peripheral edema  Neuro: non focal  Data Reviewed: Basic Metabolic Panel:  Recent Labs Lab 12/04/13 1247  12/05/13 0425  NA 137 136*  K 3.8 3.8  CL 100 100  CO2 25 24  GLUCOSE 96 168*  BUN 8 8  CREATININE 0.70 0.58  CALCIUM 9.0 9.1   Liver Function Tests:  Recent Labs Lab 12/05/13 0425  AST 13  ALT 12  ALKPHOS 95  BILITOT <0.2*  PROT 7.7  ALBUMIN 3.1*   No results found for this basename: LIPASE, AMYLASE,  in the last 168 hours No results found for this basename: AMMONIA,  in the last 168 hours CBC:  Recent Labs Lab 12/04/13 1247 12/05/13 0425  WBC 16.8* 15.3*  NEUTROABS 11.8*  --   HGB 10.0* 10.1*  HCT 33.7* 34.2*  MCV 63.9* 63.9*  PLT 360 435*   Cardiac Enzymes: No results found for this basename: CKTOTAL, CKMB, CKMBINDEX, TROPONINI,  in the last 168 hours BNP (last 3 results) No results found for this basename: PROBNP,  in the last 8760 hours CBG: No results found for this basename: GLUCAP,  in the last 168 hours  No results found for this or any previous visit (from the past 240 hour(s)).   Studies: Dg Chest 2 View  12/04/2013   CLINICAL DATA:  Short of breath.  EXAM: CHEST  2 VIEW  COMPARISON:  09/26/2013  FINDINGS: The heart size and mediastinal contours are within normal limits. Both lungs are clear. The visualized skeletal structures are  unremarkable.  IMPRESSION: No active cardiopulmonary disease.   Electronically Signed   By: Lajean Manes M.D.   On: 12/04/2013 15:07    Scheduled Meds: . albuterol  2.5 mg Nebulization TID  . budesonide-formoterol  2 puff Inhalation BID  . enoxaparin (LOVENOX) injection  60 mg Subcutaneous Q24H  . guaiFENesin  600 mg Oral BID  . levofloxacin  500 mg Oral Daily  . loratadine  10 mg Oral Daily  . methylPREDNISolone (SOLU-MEDROL) injection  60 mg Intravenous Q6H  . montelukast  10 mg Oral QHS  . sodium chloride  3 mL Intravenous Q12H   Continuous Infusions:   Principal Problem:   Asthma exacerbation  Time spent: 25  This note has been created with Surveyor, quantity.  Any transcriptional errors are unintentional.  Hosie Poisson, MD [facility] Pager [phone]. If 7 PM - 7 AM, please contact night-coverage at [email or web address], password [facility] 12/06/2013, 9:16 AM  LOS: 2 days

## 2013-12-07 DIAGNOSIS — J45909 Unspecified asthma, uncomplicated: Secondary | ICD-10-CM

## 2013-12-07 MED ORDER — LEVOFLOXACIN 500 MG PO TABS: 500.0000 mg | ORAL_TABLET | Freq: Every day | ORAL | Status: DC

## 2013-12-07 MED ORDER — HYDRALAZINE HCL 20 MG/ML IJ SOLN
5.0000 mg | Freq: Once | INTRAMUSCULAR | Status: AC
  Administered 2013-12-07: 5 mg via INTRAVENOUS
  Filled 2013-12-07: qty 0.25

## 2013-12-07 MED ORDER — AMLODIPINE BESYLATE 5 MG PO TABS: 5.0000 mg | ORAL_TABLET | Freq: Every day | ORAL | Status: DC

## 2013-12-07 MED ORDER — METHYLPREDNISOLONE (PAK) 4 MG PO TABS: ORAL_TABLET | ORAL | Status: DC

## 2013-12-07 MED ORDER — IPRATROPIUM-ALBUTEROL 0.5-2.5 (3) MG/3ML IN SOLN: 3.0000 mL | Freq: Four times a day (QID) | RESPIRATORY_TRACT | Status: DC | PRN

## 2013-12-07 NOTE — Discharge Summary (Signed)
Physician Discharge Summary  [name] TDD:[id number] DOB: 03-Apr-1976 DOA: 12/04/2013  PCP: Philis Fendt, MD  Admit date: 12/04/2013 Discharge date: 12/08/2013  Time spent: 30 minutes  Recommendations for Outpatient Follow-up:  1. Follow up with PCP in one week.  2. Follow up with your BP every day.   Discharge Diagnoses:  Principal Problem:   Asthma exacerbation hypertension.   Discharge Condition: improved.   Diet recommendation: low sodium diet  Filed Weights   12/04/13 1601  Weight: 124 kg (273 lb 5.9 oz)    History of present illness:   Presents to the ED with chief complaint of shortness of breath, wheezing, nasal congestion, headache started 4 days ago after patient took her daughter to the park.. patient has history of allergies and has been taking multiple medications at home. As per patient she was taking inhalers every 2 hours and would give relief for some time and then wheezing and shortness of breath comes back.  Hospital Course:  Asthma exacerbation - improved, started tapering steroids . Continue with symbicort, albuterol , discharged on prednisone taper.  Allergies Continue antihistamines Claritin 10 mg by mouth daily  Nausea vomiting Likely due to coughing, improved   Procedures: none Consultations:  none  Discharge Exam: Filed Vitals:   12/07/13 1439  BP: 148/97  Pulse:   Temp:   Resp:     General: alert afebrile comfortable Cardiovascular: s1s2 Respiratory: ctab  Discharge Instructions You were cared for by a hospitalist during your hospital stay. If you have any questions about your discharge medications or the care you received while you were in the hospital after you are discharged, you can call the unit and asked to speak with the hospitalist on call if the hospitalist that took care of you is not available. Once you are discharged, your primary care physician will handle any further medical issues. Please note that NO REFILLS for  any discharge medications will be authorized once you are discharged, as it is imperative that you return to your primary care physician (or establish a relationship with a primary care physician if you do not have one) for your aftercare needs so that they can reassess your need for medications and monitor your lab values.      Discharge Instructions   Discharge instructions    Complete by:  As directed   Follow upwith your PCP before the medrol dose pack is finished.            Medication List    STOP taking these medications       ibuprofen 600 MG tablet  Commonly known as:  ADVIL,MOTRIN      TAKE these medications       albuterol 108 (90 BASE) MCG/ACT inhaler  Commonly known as:  PROVENTIL HFA;VENTOLIN HFA  Inhale 2 puffs into the lungs every 6 (six) hours as needed for wheezing or shortness of breath.     amLODipine 5 MG tablet  Commonly known as:  NORVASC  Take 1 tablet (5 mg total) by mouth daily.     budesonide-formoterol 160-4.5 MCG/ACT inhaler  Commonly known as:  SYMBICORT  Inhale 2 puffs into the lungs 2 (two) times daily.     cetirizine 10 MG tablet  Commonly known as:  ZYRTEC  Take 10 mg by mouth every evening.     ferrous sulfate 325 (65 FE) MG tablet  Take 325 mg by mouth daily with breakfast.     ipratropium-albuterol 0.5-2.5 (3) MG/3ML Soln  Commonly  known as:  DUONEB  Take 3 mLs by nebulization every 6 (six) hours as needed.     levofloxacin 500 MG tablet  Commonly known as:  LEVAQUIN  Take 1 tablet (500 mg total) by mouth daily.     methylPREDNIsolone 4 MG tablet  Commonly known as:  MEDROL DOSPACK  follow package directions     montelukast 10 MG tablet  Commonly known as:  SINGULAIR  Take 10 mg by mouth at bedtime.     ranitidine 150 MG capsule  Commonly known as:  ZANTAC  Take 150 mg by mouth 2 (two) times daily as needed for heartburn.       Allergies  Allergen Reactions  . Bee Venom Anaphylaxis    Patient has an epi pen, also  has not been stung before but was informed she is allergic  . Prednisone Hives    Patient has been rechallenged with prednisone and experienced the same symptoms of hives and face swelling.  She can tolerate methylprednisolone IV and PO.   Follow-up Information   Follow up with AVBUERE,EDWIN A, MD. Schedule an appointment as soon as possible for a visit in 1 week.   Specialty:  Internal Medicine   Contact information:   [facility] [address] [phone]        The results of significant diagnostics from this hospitalization (including imaging, microbiology, ancillary and laboratory) are listed below for reference.    Significant Diagnostic Studies: Dg Chest 2 View  12/04/2013   CLINICAL DATA:  Short of breath.  EXAM: CHEST  2 VIEW  COMPARISON:  09/26/2013  FINDINGS: The heart size and mediastinal contours are within normal limits. Both lungs are clear. The visualized skeletal structures are unremarkable.  IMPRESSION: No active cardiopulmonary disease.   Electronically Signed   By: Lajean Manes M.D.   On: 12/04/2013 15:07    Microbiology: No results found for this or any previous visit (from the past 240 hour(s)).   Labs: Basic Metabolic Panel:  Recent Labs Lab 12/04/13 1247 12/05/13 0425  NA 137 136*  K 3.8 3.8  CL 100 100  CO2 25 24  GLUCOSE 96 168*  BUN 8 8  CREATININE 0.70 0.58  CALCIUM 9.0 9.1   Liver Function Tests:  Recent Labs Lab 12/05/13 0425  AST 13  ALT 12  ALKPHOS 95  BILITOT <0.2*  PROT 7.7  ALBUMIN 3.1*   No results found for this basename: LIPASE, AMYLASE,  in the last 168 hours No results found for this basename: AMMONIA,  in the last 168 hours CBC:  Recent Labs Lab 12/04/13 1247 12/05/13 0425  WBC 16.8* 15.3*  NEUTROABS 11.8*  --   HGB 10.0* 10.1*  HCT 33.7* 34.2*  MCV 63.9* 63.9*  PLT 360 435*   Cardiac Enzymes: No results found for this basename: CKTOTAL, CKMB, CKMBINDEX, TROPONINI,  in the  last 168 hours BNP: BNP (last 3 results) No results found for this basename: PROBNP,  in the last 8760 hours CBG: No results found for this basename: GLUCAP,  in the last 168 hours     Signed:  Hosie Poisson  [facility] 12/08/2013, 7:02 PM

## 2013-12-08 DIAGNOSIS — I10 Essential (primary) hypertension: Secondary | ICD-10-CM | POA: Diagnosis present

## 2014-02-21 ENCOUNTER — Emergency Department (HOSPITAL_COMMUNITY)
Admission: EM | Admit: 2014-02-21 | Discharge: 2014-02-21 | Disposition: A | Discharge status: Home / Self Care | Payer: Medicare Other | Attending: Emergency Medicine | Admitting: Emergency Medicine

## 2014-02-21 ENCOUNTER — Encounter (HOSPITAL_COMMUNITY): Payer: Self-pay | Admitting: Emergency Medicine

## 2014-02-21 DIAGNOSIS — R0602 Shortness of breath: Secondary | ICD-10-CM | POA: Insufficient documentation

## 2014-02-21 DIAGNOSIS — Z79899 Other long term (current) drug therapy: Secondary | ICD-10-CM | POA: Diagnosis not present

## 2014-02-21 DIAGNOSIS — Z8742 Personal history of other diseases of the female genital tract: Secondary | ICD-10-CM | POA: Insufficient documentation

## 2014-02-21 DIAGNOSIS — D649 Anemia, unspecified: Secondary | ICD-10-CM | POA: Insufficient documentation

## 2014-02-21 DIAGNOSIS — K219 Gastro-esophageal reflux disease without esophagitis: Secondary | ICD-10-CM | POA: Insufficient documentation

## 2014-02-21 DIAGNOSIS — J45901 Unspecified asthma with (acute) exacerbation: Secondary | ICD-10-CM | POA: Diagnosis not present

## 2014-02-21 MED ORDER — MONTELUKAST SODIUM 10 MG PO TABS: 10.0000 mg | ORAL_TABLET | Freq: Every day | ORAL | Status: DC

## 2014-02-21 MED ORDER — IPRATROPIUM BROMIDE 0.02 % IN SOLN
0.5000 mg | Freq: Once | RESPIRATORY_TRACT | Status: AC
  Administered 2014-02-21: 0.5 mg via RESPIRATORY_TRACT
  Filled 2014-02-21: qty 2.5

## 2014-02-21 MED ORDER — METHYLPREDNISOLONE 4 MG PO TABS: 4.0000 mg | ORAL_TABLET | Freq: Every day | ORAL | Status: DC

## 2014-02-21 MED ORDER — ALBUTEROL (5 MG/ML) CONTINUOUS INHALATION SOLN
10.0000 mg/h | INHALATION_SOLUTION | Freq: Once | RESPIRATORY_TRACT | Status: AC
Start: 2014-02-21 — End: 2014-02-21
  Administered 2014-02-21: 10 mg/h via RESPIRATORY_TRACT
  Filled 2014-02-21: qty 20

## 2014-02-21 MED ORDER — DEXAMETHASONE SODIUM PHOSPHATE 10 MG/ML IJ SOLN
10.0000 mg | Freq: Once | INTRAMUSCULAR | Status: AC
  Administered 2014-02-21: 10 mg via INTRAMUSCULAR
  Filled 2014-02-21: qty 1

## 2014-02-21 MED ORDER — METHYLPREDNISOLONE 4 MG PO TABS: 4.0000 mg | ORAL_TABLET | ORAL | Status: DC

## 2014-02-21 MED ORDER — FLUTICASONE PROPIONATE 50 MCG/ACT NA SUSP: 2.0000 | Freq: Every day | NASAL | Status: DC

## 2014-02-21 MED ORDER — LEVOCETIRIZINE DIHYDROCHLORIDE 5 MG PO TABS: 5.0000 mg | ORAL_TABLET | Freq: Every evening | ORAL | Status: DC

## 2014-02-21 NOTE — ED Provider Notes (Signed)
CSN: [id number]     Arrival date & time 02/21/14  1127 History   First MD Initiated Contact with Patient 02/21/14 1145     Chief Complaint  Patient presents with  . Shortness of Breath   Patient is a 38 y.o. female presenting with shortness of breath.  Shortness of Breath Associated symptoms: cough and wheezing   Associated symptoms: no abdominal pain, no chest pain, no diaphoresis, no fever, no rash, no sore throat and no vomiting    [name] is a 38 yo woman with a PMH of asthma (multiple hospitalizations, but never intubated) who is presenting with congestion and shortness of breath. She had similar, less intense symptoms in mid-July and was prescribed a 6-day course of antibiotics by her PCP. She took the full course, but her symptoms continued and even progressed to the point where she currently has "plugged up ears", nasal congestion and shortness of breath. She denies fever, chills, throat pain, calf pain, chest pain or any other symptoms.  Past Medical History  Diagnosis Date  . Asthma   . Environmental allergies   . Fibroids   . Shortness of breath   . GERD (gastroesophageal reflux disease)   . Anemia   . Fibroid, uterine   . Menorrhagia   . Blood transfusion without reported diagnosis    Past Surgical History  Procedure Laterality Date  . Ectopic pregnancy surgery    . Myomectomy    . Cholecystectomy      06/29/2011  . Cholecystectomy  06/27/2011    Procedure: LAPAROSCOPIC CHOLECYSTECTOMY WITH INTRAOPERATIVE CHOLANGIOGRAM;  Surgeon: Gayland Curry, MD;  Location: [place];  Service: General;  Laterality: N/A;  . Cholecystectomy  06/29/2011    Procedure: LAPAROSCOPIC CHOLECYSTECTOMY WITH INTRAOPERATIVE CHOLANGIOGRAM;  Surgeon: Merrie Roof, MD;  Location: [place];  Service: General;  Laterality: N/A;  . Ercp  06/30/2011    Procedure: ENDOSCOPIC RETROGRADE CHOLANGIOPANCREATOGRAPHY (ERCP);  Surgeon: Jeryl Columbia, MD;  Location: [facility];  Service: Endoscopy;  Laterality:  N/A;  probable sphincterotomy  . Ercp  07/03/2011    Procedure: ENDOSCOPIC RETROGRADE CHOLANGIOPANCREATOGRAPHY (ERCP);  Surgeon: Landry Dyke, MD;  Location: [place];  Service: Endoscopy;  Laterality: N/A;  PROPOFOL  . Myomectomy N/A 11/14/2012    Procedure: MYOMECTOMY;  Surgeon: Osborne Oman, MD;  Location: [facility];  Service: Gynecology;  Laterality: N/A;   Family History  Problem Relation Age of Onset  . Anesthesia problems Neg Hx   . Hypotension Neg Hx   . Malignant hyperthermia Neg Hx   . Pseudochol deficiency Neg Hx   . Hypertension Mother   . Diabetes Mother   . Hypertension Father   . Hypertension Brother    History  Substance Use Topics  . Smoking status: Never Smoker   . Smokeless tobacco: Never Used  . Alcohol Use: No   OB History   Grav Para Term Preterm Abortions TAB SAB Ect Mult Living   1    1   1        Review of Systems  Constitutional: Negative for fever, chills, diaphoresis, activity change and appetite change.  HENT: Positive for congestion. Negative for sore throat.   Eyes: Negative for discharge and redness.  Respiratory: Positive for cough, shortness of breath and wheezing.   Cardiovascular: Negative for chest pain, palpitations and leg swelling.  Gastrointestinal: Negative for vomiting and abdominal pain.  Genitourinary: Negative for dysuria.  Musculoskeletal: Negative for arthralgias and neck stiffness.  Skin: Negative for  rash.  Allergic/Immunologic: Positive for environmental allergies.  Neurological: Negative for weakness.  Psychiatric/Behavioral: The patient is not nervous/anxious.   All other systems reviewed and are negative.     Allergies  Bee venom and Prednisone  Home Medications   Prior to Admission medications   Medication Sig Start Date End Date Taking? Authorizing Provider  albuterol (PROVENTIL HFA;VENTOLIN HFA) 108 (90 BASE) MCG/ACT inhaler Inhale 2 puffs into the lungs every 6 (six) hours as needed for wheezing or shortness  of breath.     Historical Provider, MD  amLODipine (NORVASC) 5 MG tablet Take 1 tablet (5 mg total) by mouth daily. 12/08/13   Hosie Poisson, MD  budesonide-formoterol (SYMBICORT) 160-4.5 MCG/ACT inhaler Inhale 2 puffs into the lungs 2 (two) times daily.      Historical Provider, MD  cetirizine (ZYRTEC) 10 MG tablet Take 10 mg by mouth every evening.     Historical Provider, MD  ferrous sulfate 325 (65 FE) MG tablet Take 325 mg by mouth daily with breakfast.    Historical Provider, MD  ipratropium-albuterol (DUONEB) 0.5-2.5 (3) MG/3ML SOLN Take 3 mLs by nebulization every 6 (six) hours as needed. 12/07/13   Hosie Poisson, MD  levofloxacin (LEVAQUIN) 500 MG tablet Take 1 tablet (500 mg total) by mouth daily. 12/07/13   Hosie Poisson, MD  methylPREDNIsolone (MEDROL DOSPACK) 4 MG tablet follow package directions 12/07/13   Hosie Poisson, MD  montelukast (SINGULAIR) 10 MG tablet Take 10 mg by mouth at bedtime.      Historical Provider, MD  ranitidine (ZANTAC) 150 MG capsule Take 150 mg by mouth 2 (two) times daily as needed for heartburn.     Historical Provider, MD   BP 172/85  Pulse 87  Temp(Src) 98.1 F (36.7 C) (Oral)  Resp 24  SpO2 99%  LMP 02/17/2014 Physical Exam  Constitutional: She is oriented to person, place, and time. She appears well-developed and well-nourished.  HENT:  Head: Normocephalic and atraumatic.  Mouth/Throat: Oropharynx is clear and moist.  Obvious congestion  Eyes: Conjunctivae and EOM are normal. Pupils are equal, round, and reactive to light. Right eye exhibits no discharge. Left eye exhibits no discharge.  Neck: Neck supple.  Cardiovascular: Normal rate, regular rhythm and normal heart sounds.   Pulmonary/Chest: She has wheezes. She has no rales. She exhibits no tenderness.  Loud end- expiratory wheezes throughout both lung fields  Abdominal: Soft. Bowel sounds are normal.  Musculoskeletal: She exhibits no edema and no tenderness.  Neurological: She is alert and  oriented to person, place, and time.  Skin: Skin is warm and dry. She is not diaphoretic.  Psychiatric: She has a normal mood and affect.    ED Course  Procedures (including critical care time) Labs Review Labs Reviewed - No data to display  Imaging Review No results found.   EKG Interpretation   Date/Time:  Wednesday February 21 2014 11:46:33 EDT Ventricular Rate:  85 PR Interval:  129 QRS Duration: 96 QT Interval:  374 QTC Calculation: 445 R Axis:   57 Text Interpretation:  Sinus rhythm Probable left atrial enlargement  Borderline T wave abnormalities No significant change since last tracing  Confirmed by Christy Gentles  MD, [place] ([id number]) on 02/21/2014 12:06:09 PM      MDM   Final diagnoses:  None    [name] is a 38 yo woman with asthma who is here with an asthma exacerbation. She will be treated with continuous albuterol and atrovent nebs and decadron injection. She is in need  of renewed outpatient prescriptions.    Drucilla Schmidt, MD 02/28/14 [phone]

## 2014-02-21 NOTE — ED Notes (Signed)
Pt sat 96-98% RA while ambulating

## 2014-02-21 NOTE — Discharge Instructions (Signed)
Medrol dose pack: Please take as follows:  Friday (day 1): 2 tablets before breakfast, 1 after lunch, 1 after dinner and 2 at bedtime Saturday (day 2): 1 tablet before breakfast, 1 after lunch, 1 after dinner, 2 at bedtime Sunday (day 3): 1 before breakfast, 1 after lunch, 1 after dinner, 1 at bedtime Monday (day 4): 1 before breakfast, 1 after lunch, 1 at bedtime Tuesday (day 5): 1 before breakfast, 1 at bedtime Wednesday (day 6): 1 before breakfast  Asthma Attack Prevention Although there is no way to prevent asthma from starting, you can take steps to control the disease and reduce its symptoms. Learn about your asthma and how to control it. Take an active role to control your asthma by working with your health care provider to create and follow an asthma action plan. An asthma action plan guides you in:  Taking your medicines properly.  Avoiding things that set off your asthma or make your asthma worse (asthma triggers).  Tracking your level of asthma control.  Responding to worsening asthma.  Seeking emergency care when needed. To track your asthma, keep records of your symptoms, check your peak flow number using a handheld device that shows how well air moves out of your lungs (peak flow meter), and get regular asthma checkups.  WHAT ARE SOME WAYS TO PREVENT AN ASTHMA ATTACK?  Take medicines as directed by your health care provider.  Keep track of your asthma symptoms and level of control.  With your health care provider, write a detailed plan for taking medicines and managing an asthma attack. Then be sure to follow your action plan. Asthma is an ongoing condition that needs regular monitoring and treatment.  Identify and avoid asthma triggers. Many outdoor allergens and irritants (such as pollen, mold, cold air, and air pollution) can trigger asthma attacks. Find out what your asthma triggers are and take steps to avoid them.  Monitor your breathing. Learn to recognize  warning signs of an attack, such as coughing, wheezing, or shortness of breath. Your lung function may decrease before you notice any signs or symptoms, so regularly measure and record your peak airflow with a home peak flow meter.  Identify and treat attacks early. If you act quickly, you are less likely to have a severe attack. You will also need less medicine to control your symptoms. When your peak flow measurements decrease and alert you to an upcoming attack, take your medicine as instructed and immediately stop any activity that may have triggered the attack. If your symptoms do not improve, get medical help.  Pay attention to increasing quick-relief inhaler use. If you find yourself relying on your quick-relief inhaler, your asthma is not under control. See your health care provider about adjusting your treatment. WHAT CAN MAKE MY SYMPTOMS WORSE? A number of common things can set off or make your asthma symptoms worse and cause temporary increased inflammation of your airways. Keep track of your asthma symptoms for several weeks, detailing all the environmental and emotional factors that are linked with your asthma. When you have an asthma attack, go back to your asthma diary to see which factor, or combination of factors, might have contributed to it. Once you know what these factors are, you can take steps to control many of them. If you have allergies and asthma, it is important to take asthma prevention steps at home. Minimizing contact with the substance to which you are allergic will help prevent an asthma attack. Some triggers and ways  to avoid these triggers are: Animal Dander:  Some people are allergic to the flakes of skin or dried saliva from animals with fur or feathers.   There is no such thing as a hypoallergenic dog or cat breed. All dogs or cats can cause allergies, even if they don't shed.  Keep these pets out of your home.  If you are not able to keep a pet outdoors, keep the  pet out of your bedroom and other sleeping areas at all times, and keep the door closed.  Remove carpets and furniture covered with cloth from your home. If that is not possible, keep the pet away from fabric-covered furniture and carpets. Dust Mites: Many people with asthma are allergic to dust mites. Dust mites are tiny bugs that are found in every home in mattresses, pillows, carpets, fabric-covered furniture, bedcovers, clothes, stuffed toys, and other fabric-covered items.   Cover your mattress in a special dust-proof cover.  Cover your pillow in a special dust-proof cover, or wash the pillow each week in hot water. Water must be hotter than 130 F (54.4 C) to kill dust mites. Cold or warm water used with detergent and bleach can also be effective.  Wash the sheets and blankets on your bed each week in hot water.  Try not to sleep or lie on cloth-covered cushions.  Call ahead when traveling and ask for a smoke-free hotel room. Bring your own bedding and pillows in case the hotel only supplies feather pillows and down comforters, which may contain dust mites and cause asthma symptoms.  Remove carpets from your bedroom and those laid on concrete, if you can.  Keep stuffed toys out of the bed, or wash the toys weekly in hot water or cooler water with detergent and bleach. Cockroaches: Many people with asthma are allergic to the droppings and remains of cockroaches.   Keep food and garbage in closed containers. Never leave food out.  Use poison baits, traps, powders, gels, or paste (for example, boric acid).  If a spray is used to kill cockroaches, stay out of the room until the odor goes away. Indoor Mold:  Fix leaky faucets, pipes, or other sources of water that have mold around them.  Clean floors and moldy surfaces with a fungicide or diluted bleach.  Avoid using humidifiers, vaporizers, or swamp coolers. These can spread molds through the air. Pollen and Outdoor Mold:  When  pollen or mold spore counts are high, try to keep your windows closed.  Stay indoors with windows closed from late morning to afternoon. Pollen and some mold spore counts are highest at that time.  Ask your health care provider whether you need to take anti-inflammatory medicine or increase your dose of the medicine before your allergy season starts. Other Irritants to Avoid:  Tobacco smoke is an irritant. If you smoke, ask your health care provider how you can quit. Ask family members to quit smoking, too. Do not allow smoking in your home or car.  If possible, do not use a wood-burning stove, kerosene heater, or fireplace. Minimize exposure to all sources of smoke, including incense, candles, fires, and fireworks.  Try to stay away from strong odors and sprays, such as perfume, talcum powder, hair spray, and paints.  Decrease humidity in your home and use an indoor air cleaning device. Reduce indoor humidity to below 60%. Dehumidifiers or central air conditioners can do this.  Decrease house dust exposure by changing furnace and air cooler filters frequently.  Try  to have someone else vacuum for you once or twice a week. Stay out of rooms while they are being vacuumed and for a short while afterward.  If you vacuum, use a dust mask from a hardware store, a double-layered or microfilter vacuum cleaner bag, or a vacuum cleaner with a HEPA filter.  Sulfites in foods and beverages can be irritants. Do not drink beer or wine or eat dried fruit, processed potatoes, or shrimp if they cause asthma symptoms.  Cold air can trigger an asthma attack. Cover your nose and mouth with a scarf on cold or windy days.  Several health conditions can make asthma more difficult to manage, including a runny nose, sinus infections, reflux disease, psychological stress, and sleep apnea. Work with your health care provider to manage these conditions.  Avoid close contact with people who have a respiratory  infection such as a cold or the flu, since your asthma symptoms may get worse if you catch the infection. Wash your hands thoroughly after touching items that may have been handled by people with a respiratory infection.  Get a flu shot every year to protect against the flu virus, which often makes asthma worse for days or weeks. Also get a pneumonia shot if you have not previously had one. Unlike the flu shot, the pneumonia shot does not need to be given yearly. Medicines:  Talk to your health care provider about whether it is safe for you to take aspirin or non-steroidal anti-inflammatory medicines (NSAIDs). In a small number of people with asthma, aspirin and NSAIDs can cause asthma attacks. These medicines must be avoided by people who have known aspirin-sensitive asthma. It is important that people with aspirin-sensitive asthma read labels of all over-the-counter medicines used to treat pain, colds, coughs, and fever.  Beta-blockers and ACE inhibitors are other medicines you should discuss with your health care provider. HOW CAN I FIND OUT WHAT I AM ALLERGIC TO? Ask your asthma health care provider about allergy skin testing or blood testing (the RAST test) to identify the allergens to which you are sensitive. If you are found to have allergies, the most important thing to do is to try to avoid exposure to any allergens that you are sensitive to as much as possible. Other treatments for allergies, such as medicines and allergy shots (immunotherapy) are available.  CAN I EXERCISE? Follow your health care provider's advice regarding asthma treatment before exercising. It is important to maintain a regular exercise program, but vigorous exercise or exercise in cold, humid, or dry environments can cause asthma attacks, especially for those people who have exercise-induced asthma. Document Released: 06/17/2009 Document Revised: 07/04/2013 Document Reviewed: 01/04/2013 [facility] Patient Information 2015  [place], [place]. This information is not intended to replace advice given to you by your health care provider. Make sure you discuss any questions you have with your health care provider.

## 2014-02-21 NOTE — ED Notes (Signed)
Pt c/o SOB, states inhaler has been working but since pt is cogested she states its hard to breath. States when she blows her nose its as if she is going to pass out. Denies pain.

## 2014-02-21 NOTE — ED Provider Notes (Signed)
Patient seen/examined in the Emergency Department in conjunction with Resident Physician Provider [place] Patient reports shortness of breath/wheezing Exam : awake/alert, no distress but pt is wheezing bilaterally Plan: pt given steroids (She can tolerate decadron) as well as nebulizers   Sharyon Cable, MD 02/21/14 1238

## 2014-02-21 NOTE — ED Notes (Signed)
Respiratory therapist called for nebulizer treatment.

## 2014-02-26 ENCOUNTER — Encounter: Payer: Self-pay | Admitting: Internal Medicine

## 2014-02-26 ENCOUNTER — Other Ambulatory Visit (INDEPENDENT_AMBULATORY_CARE_PROVIDER_SITE_OTHER): Payer: Medicare Other

## 2014-02-26 ENCOUNTER — Ambulatory Visit (INDEPENDENT_AMBULATORY_CARE_PROVIDER_SITE_OTHER): Payer: Medicare Other | Admitting: Internal Medicine

## 2014-02-26 VITALS — BP 138/88 | HR 82 | Temp 97.1°F | Ht 62.0 in | Wt 281.2 lb

## 2014-02-26 DIAGNOSIS — J45901 Unspecified asthma with (acute) exacerbation: Secondary | ICD-10-CM

## 2014-02-26 LAB — CBC WITH DIFFERENTIAL/PLATELET
BASOS ABS: 0.1 10*3/uL (ref 0.0–0.1)
BASOS PCT: 0.5 % (ref 0.0–3.0)
EOS ABS: 2.1 10*3/uL — AB (ref 0.0–0.7)
Eosinophils Relative: 13.7 % — ABNORMAL HIGH (ref 0.0–5.0)
HCT: 34.6 % — ABNORMAL LOW (ref 36.0–46.0)
Hemoglobin: 10.7 g/dL — ABNORMAL LOW (ref 12.0–15.0)
Lymphocytes Relative: 20.1 % (ref 12.0–46.0)
Lymphs Abs: 3.1 10*3/uL (ref 0.7–4.0)
MCHC: 30.8 g/dL (ref 30.0–36.0)
MONO ABS: 0.7 10*3/uL (ref 0.1–1.0)
Monocytes Relative: 4.4 % (ref 3.0–12.0)
NEUTROS ABS: 9.4 10*3/uL — AB (ref 1.4–7.7)
NEUTROS PCT: 61.3 % (ref 43.0–77.0)
Platelets: 437 10*3/uL — ABNORMAL HIGH (ref 150.0–400.0)
RBC: 5.28 Mil/uL — AB (ref 3.87–5.11)
RDW: 21.7 % — AB (ref 11.5–15.5)
WBC: 15.4 10*3/uL — ABNORMAL HIGH (ref 4.0–10.5)

## 2014-02-26 MED ORDER — MOMETASONE FURO-FORMOTEROL FUM 200-5 MCG/ACT IN AERO: INHALATION_SPRAY | RESPIRATORY_TRACT | Status: DC

## 2014-02-26 MED ORDER — MONTELUKAST SODIUM 10 MG PO TABS: 10.0000 mg | ORAL_TABLET | Freq: Every day | ORAL | Status: DC

## 2014-02-26 NOTE — Progress Notes (Signed)
Subjective:     Patient ID: [name], female   DOB: 01/07/1976    MRN: [id number]    Brief patient profile:  82 yobf never smoker with asthma all her life last well p steroid injection around 12/2010 and downhill since then referred to pulmonary clinic [facility]/ [facility] is primary    History of Present Illness  08/05/2011 1st pulmonary ov for lifelong asthma under care of [name] with only good breathing p depomedrol injection on allergy shots didn't help last tried them around 2009 and not effective with use of HFA but using up to 8 x in 24 hours.  Sob with anything more than slow adls since 12/8030 but some better p saba.  Intermittent severe mostly dry cough, no purulent sputum, some assoc nasal congestion. Symptoms day and night to point of interfering with sleep nightly. rec Plan A Symbicort 160 Take 2 puffs first thing in am and then another 2 puffs about 12 hours later.  Prilosec 20 mg Take 30-60 min before first meal of the day  Singulair 10 mg one in evening Zantac 75 mg 2 at bedtime  Plan B  If short of breath >  Ventolin up to every 4 hours If cough > delsym (otc)   Plan C  If really short of breath and ventolin not working use the nebulizer If really can't stop coughing add tramadol to the delsym   See [name] w/in 2 weeks > never returned / "[name]  stopped my allergy shots because my asthma was poorly controlled" and did not return to him either. Ran out of all meds      02/26/2014  Post ER extended ov/[name] re: consultation requested by [name]/ [name]  Chief Complaint  Patient presents with  . Follow-up    last seen 2013.  Nasal congestion with clear to yellow mucus, ear stuffiness, and HAs.    Depomedrol always works, this time received 02/21/14 and also medrol but hasn't started yet 50% bettter on day of ov, still stuffy head thick white mucus  Off all maint rx x "months"   No obvious patterns in  day to day or daytime variabilty or  assoc chronic cough or cp or chest tightness, subjective wheeze or overt  hb symptoms. No unusual exp hx or h/o childhood pna/ asthma or knowledge of premature birth.    Also denies any obvious fluctuation of symptoms with weather or environmental changes or other aggravating or alleviating factors except as outlined above   Current Medications, Allergies, Complete Past Medical History, Past Surgical History, Family History, and Social History were reviewed in [employer] record.  ROS  The following are not active complaints unless bolded sore throat, dysphagia, dental problems, itching, sneezing,  nasal congestion or excess/ purulent secretions, ear ache,   fever, chills, sweats, unintended wt loss, pleuritic or exertional cp, hemoptysis,  orthopnea pnd or leg swelling, presyncope, palpitations, heartburn, abdominal pain, anorexia, nausea, vomiting, diarrhea  or change in bowel or urinary habits, change in stools or urine, dysuria,hematuria,  rash, arthralgias, visual complaints, headache, numbness weakness or ataxia or problems with walking or coordination,  change in mood/affect or memory.                Objective:   Physical Exam  Wt Readings from Last 3 Encounters:  02/26/14 281 lb 3.2 oz (127.551 kg)  12/04/13 273 lb 5.9 oz (124 kg)  09/26/13 276 lb 12.8 oz (125.556  kg)     BaselineWt 251 08/05/11   amb obese bf nad  HEENT: nl dentition,  and orophanx. Nl external ear canals without cough reflex. Moderate turbinate edema with non-specific features   NECK :  without JVD/Nodes/TM/ nl carotid upstrokes bilaterally   LUNGS: no acc muscle use, trace end exp wheeze only     CV:  RRR  no s3 or murmur or increase in P2, no edema   ABD:  soft and nontender with nl excursion in the supine position. No bruits or organomegaly, bowel sounds nl  MS:  warm without deformities, calf tenderness, cyanosis or clubbing  SKIN: warm and dry without lesions    NEURO:   alert, approp, no deficits      cxr 12/04/13 The heart size and mediastinal contours are within normal limits.  Both lungs are clear. The visualized skeletal structures are  unremarkable.      Assessment:

## 2014-02-26 NOTE — Assessment & Plan Note (Signed)
DDX of  difficult airways management all start with A and  include Adherence, Ace Inhibitors, Acid Reflux, Active Sinus Disease, Alpha 1 Antitripsin deficiency, Anxiety masquerading as Airways dz,  ABPA,  allergy(esp in young), Aspiration (esp in elderly), Adverse effects of DPI,  Active smokers, plus two Bs  = Bronchiectasis and Beta blocker use..and one C= CHF  Adherence is always the initial "prime suspect" and is a multilayered concern that requires a "trust but verify" approach in every patient - starting with knowing how to use medications, especially inhalers, correctly, keeping up with refills and understanding the fundamental difference between maintenance and prns vs those medications only taken for a very short course and then stopped and not refilled.  - need a trust but verify ov .  To keep things simple, I have asked the patient to first separate medicines that are perceived as maintenance, that is to be taken daily "no matter what", from those medicines that are taken on only on an as-needed basis and I have given the patient examples of both, and then return to see our NP to generate a  detailed  medication calendar which should be followed until the next physician sees the patient and updates it.  - The proper method of use, as well as anticipated side effects, of a metered-dose inhaler are discussed and demonstrated to the patient. Improved effectiveness after extensive coaching during this visit to a level of approximately  50% so clearly needs work    ? Allergy > finish medrol/ start back on singulair with low threshold to d/c if not convinced helping. Repeat allergy profile   See instructions for specific recommendations which were reviewed directly with the patient who was given a copy with highlighter outlining the key components.

## 2014-02-26 NOTE — Patient Instructions (Addendum)
Dulera 200 Take 2 puffs first thing in am and then another 2 puffs about 12 hours later  Go ahead and take the medrol dose pack  Start back singulair (montelukast) 20 mg daily   For nasal and ear congestion take Zyrtec D as needed   Please remember to go to the lab  department downstairs for your tests - we will call you with the results when they are available.     See [name] w/in 2 weeks with all your medications and your drug formulary  even over the counter meds, separated in two separate bags, the ones you take no matter what vs the ones you stop once you feel better and take only as needed when you feel you need them.   [name]  will generate for you a new user friendly medication calendar that will put [place] all on the same page re: your medication use.     Without this process, it simply isn't possible to assure that we are providing  your outpatient care  with  the attention to detail we feel you deserve.   If we cannot assure that you're getting that kind of care,  then we cannot manage your problem effectively from this clinic.  Once you have seen [name] and we are sure that we're all on the same page with your medication use she will arrange follow up with me.

## 2014-02-28 NOTE — ED Provider Notes (Signed)
I have personally seen and examined the patient.  I have discussed the plan of care with the resident.  I have reviewed the documentation on PMH/FH/Soc. History.  I have reviewed the documentation of the resident and agree.  Pt stabilized in the ED and appropriate for discharge  Sharyon Cable, MD 02/28/14 2348

## 2014-03-12 ENCOUNTER — Encounter: Payer: Self-pay | Admitting: Adult Health

## 2014-03-12 ENCOUNTER — Ambulatory Visit (INDEPENDENT_AMBULATORY_CARE_PROVIDER_SITE_OTHER): Payer: Medicare Other | Admitting: Adult Health

## 2014-03-12 VITALS — BP 110/74 | HR 95 | Temp 98.5°F | Ht 62.0 in | Wt 284.8 lb

## 2014-03-12 DIAGNOSIS — J45901 Unspecified asthma with (acute) exacerbation: Secondary | ICD-10-CM

## 2014-03-12 MED ORDER — AMOXICILLIN-POT CLAVULANATE 875-125 MG PO TABS: 1.0000 | ORAL_TABLET | Freq: Two times a day (BID) | ORAL | Status: AC

## 2014-03-12 MED ORDER — MOMETASONE FURO-FORMOTEROL FUM 200-5 MCG/ACT IN AERO: INHALATION_SPRAY | RESPIRATORY_TRACT | Status: DC

## 2014-03-12 NOTE — Patient Instructions (Signed)
Continue on Dulera 200 2 puffs Twice daily  , rinse after use.  Take prescription by your pharmacy with your insurance card, call if this is not covered.  Augmentin 875mg  Twice daily  For 10 days  Mucinex Twice daily  As needed  Cough/congestion  Fluids and rest  Saline nasal rinses As needed   Please contact office for sooner follow up if symptoms do not improve or worsen or seek emergency care  Follow up Dr. Melvyn Novas  In 2 weeks and As needed

## 2014-03-13 NOTE — Assessment & Plan Note (Signed)
Flare with sinusitis   Plan  Continue on Dulera 200 2 puffs Twice daily  , rinse after use.  Take prescription by your pharmacy with your insurance card, call if this is not covered.  Augmentin 875mg  Twice daily  For 10 days  Mucinex Twice daily  As needed  Cough/congestion  Fluids and rest  Saline nasal rinses As needed   Please contact office for sooner follow up if symptoms do not improve or worsen or seek emergency care  Follow up Dr. Melvyn Novas  In 2 weeks and As needed

## 2014-03-13 NOTE — Progress Notes (Signed)
Subjective:     Patient ID: [name], female   DOB: 1975/07/17    MRN: [id number]    Brief patient profile:  16 yobf never smoker with asthma all her life last well p steroid injection around 12/2010 and downhill since then referred to pulmonary clinic by [facility]/ [facility]erre is primary    History of Present Illness  08/05/2011 1st pulmonary ov for lifelong asthma under care of Dr Bernita Buffy with only good breathing p depomedrol injection on allergy shots didn't help last tried them around 2009 and not effective with use of HFA but using up to 8 x in 24 hours.  Sob with anything more than slow adls since 11/3974 but some better p saba.  Intermittent severe mostly dry cough, no purulent sputum, some assoc nasal congestion. Symptoms day and night to point of interfering with sleep nightly. rec Plan A Symbicort 160 Take 2 puffs first thing in am and then another 2 puffs about 12 hours later.  Prilosec 20 mg Take 30-60 min before first meal of the day  Singulair 10 mg one in evening Zantac 75 mg 2 at bedtime  Plan B  If short of breath >  Ventolin up to every 4 hours If cough > delsym (otc)   Plan C  If really short of breath and ventolin not working use the nebulizer If really can't stop coughing add tramadol to the delsym   See [name] w/in 2 weeks > never returned / "[name]  stopped my allergy shots because my asthma was poorly controlled" and did not return to him either. Ran out of all meds      02/26/2014  Post ER extended ov/[facility] re: consultation requested by EDP/ Dr Christy Gentles  Chief Complaint  Patient presents with  . Follow-up    last seen 2013.  Nasal congestion with clear to yellow mucus, ear stuffiness, and HAs.    Depomedrol always works, this time received 02/21/14 and also medrol but hasn't started yet 50% bettter on day of ov, still stuffy head thick white mucus  Off all maint rx x "months" >>Dulera and singulair rx   03/12/14 Follow up  Pt returns for 2  week follow up for asthma  Last ov started on Dulera and singulair.  Says her breathing is some better but now has sinus congestion , pain and pressure.  Is confused with her insurance coverage. Previously on symbicort but insurance will no longer cover.  Started on Dulera last ov but  Has not taken rx back to pharmacy, WE tried to call today but pharmacist said it would take a while to run coverage b/c she did not have pt information. Pt agreed to take rx by pharmacy with her insurance cards.  Denies chest pain, orthopnea, edema or hemoptysis.  Labs showed elevated eosinophils on cbc . Allergy profile did not result, lab contacted, results pending.    Current Medications, Allergies, Complete Past Medical History, Past Surgical History, Family History, and Social History were reviewed in [employer] record.  ROS  The following are not active complaints unless bolded sore throat, dysphagia, dental problems, itching, sneezing,  nasal congestion or excess/ purulent secretions, ear ache,   fever, chills, sweats, unintended wt loss, pleuritic or exertional cp, hemoptysis,  orthopnea pnd or leg swelling, presyncope, palpitations, heartburn, abdominal pain, anorexia, nausea, vomiting, diarrhea  or change in bowel or urinary habits, change in stools or urine, dysuria,hematuria,  rash, arthralgias, visual complaints, headache,  numbness weakness or ataxia or problems with walking or coordination,  change in mood/affect or memory.                Objective:   Physical Exam    BaselineWt 251 08/05/11 03/12/14 284   amb obese bf nad  HEENT: nl dentition,  and orophanx. Nl external ear canals without cough reflex. Moderate turbinate edema with non-specific features   NECK :  without JVD/Nodes/TM/ nl carotid upstrokes bilaterally   LUNGS: no acc muscle use, trace end exp wheeze only     CV:  RRR  no s3 or murmur or increase in P2, no edema   ABD:  soft and nontender with nl  excursion in the supine position. No bruits or organomegaly, bowel sounds nl  MS:  warm without deformities, calf tenderness, cyanosis or clubbing  SKIN: warm and dry without lesions    NEURO:  alert, approp, no deficits      cxr 12/04/13 The heart size and mediastinal contours are within normal limits.  Both lungs are clear. The visualized skeletal structures are  unremarkable.      Assessment:

## 2014-03-16 ENCOUNTER — Other Ambulatory Visit: Payer: Self-pay | Admitting: Internal Medicine

## 2014-03-16 ENCOUNTER — Telehealth: Payer: Self-pay | Admitting: Internal Medicine

## 2014-03-16 ENCOUNTER — Encounter: Payer: Self-pay | Admitting: Internal Medicine

## 2014-03-16 NOTE — Telephone Encounter (Signed)
Per MW- allergy profile pos for dog dander, avoid if can and will discuss at [facility] with pt and notified of results per Dr. Melvyn Novas. Pt verbalized understanding and denied any questions.

## 2014-03-26 ENCOUNTER — Encounter: Payer: Self-pay | Admitting: Internal Medicine

## 2014-03-26 ENCOUNTER — Ambulatory Visit (INDEPENDENT_AMBULATORY_CARE_PROVIDER_SITE_OTHER): Payer: Medicare Other | Admitting: Internal Medicine

## 2014-03-26 ENCOUNTER — Ambulatory Visit (INDEPENDENT_AMBULATORY_CARE_PROVIDER_SITE_OTHER)
Admission: RE | Admit: 2014-03-26 | Discharge: 2014-03-26 | Disposition: A | Discharge status: Home / Self Care | Payer: Medicare Other | Source: Ambulatory Visit | Attending: Internal Medicine | Admitting: Internal Medicine

## 2014-03-26 ENCOUNTER — Telehealth: Payer: Self-pay | Admitting: Internal Medicine

## 2014-03-26 VITALS — BP 120/90 | HR 94 | Temp 98.5°F | Ht 62.0 in | Wt 278.8 lb

## 2014-03-26 DIAGNOSIS — J45901 Unspecified asthma with (acute) exacerbation: Secondary | ICD-10-CM

## 2014-03-26 DIAGNOSIS — J4541 Moderate persistent asthma with (acute) exacerbation: Secondary | ICD-10-CM

## 2014-03-26 DIAGNOSIS — J45909 Unspecified asthma, uncomplicated: Secondary | ICD-10-CM

## 2014-03-26 DIAGNOSIS — J019 Acute sinusitis, unspecified: Secondary | ICD-10-CM | POA: Insufficient documentation

## 2014-03-26 MED ORDER — METHYLPREDNISOLONE 4 MG PO TABS: ORAL_TABLET | ORAL | Status: DC

## 2014-03-26 NOTE — Assessment & Plan Note (Signed)
-   Sinus CT 03/26/2014 >Widespread paranasal sinus disease, most pronounced in the ethmoid air cell complexes but significant in the right maxillary and right frontal sinuses is well. There is bilateral ostiomeatal unit complex obstruction. Expansion of multiple ethmoid air cells appears stable compared to prior study. Note that there is currently an air-fluid level in the left maxillary antrum as well as polypoid change in this area. 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   No better p 10 d of augmentin needs ent eval asap

## 2014-03-26 NOTE — Telephone Encounter (Signed)
Called and spoke with the pts pharmacy and they just wanted to clarify the rx.  This has been completed and nothing further is needed.

## 2014-03-26 NOTE — Progress Notes (Signed)
Subjective:     Patient ID: [name], female   DOB: 1976-04-01    MRN: [id number]    Brief patient profile:  20 yobf never smoker with asthma all her life last well p steroid injection around 12/2010 and downhill since then referred to pulmonary clinic by [name]/ [name] is primary - found to have severe acute/chronic sinusitis 03/26/2014 and referred to ENT same day   History of Present Illness  08/05/2011 1st pulmonary ov for lifelong asthma under care of [name] with only good breathing p depomedrol injection on allergy shots didn't help last tried them around 2009 and not effective with use of HFA but using up to 8 x in 24 hours.  Sob with anything more than slow adls since 09/7900 but some better p saba.  Intermittent severe mostly dry cough, no purulent sputum, some assoc nasal congestion. Symptoms day and night to point of interfering with sleep nightly. rec Plan A Symbicort 160 Take 2 puffs first thing in am and then another 2 puffs about 12 hours later.  Prilosec 20 mg Take 30-60 min before first meal of the day  Singulair 10 mg one in evening Zantac 75 mg 2 at bedtime  Plan B  If short of breath >  Ventolin up to every 4 hours If cough > delsym (otc)   Plan C  If really short of breath and ventolin not working use the nebulizer If really can't stop coughing add tramadol to the delsym   See [name] w/in 2 weeks > never returned / "[name]  stopped my allergy shots because my asthma was poorly controlled" and did not return to him either. Ran out of all meds      02/26/2014  Post ER extended ov/[name] re: consultation requested by EDP/ [name]  Chief Complaint  Patient presents with  . Follow-up    last seen 2013.  Nasal congestion with clear to yellow mucus, ear stuffiness, and HAs.    Depomedrol always works, this time received 02/21/14 and also medrol but hasn't started yet 50% bettter on day of ov, still stuffy head thick white mucus  Off all  maint rx x "months" >>Dulera and singulair rx   03/12/14 Follow up  Pt returns for 2 week follow up for asthma  Last ov started on Dulera and singulair.  Says her breathing is some better but now has sinus congestion , pain and pressure.  Is confused with her insurance coverage. Previously on symbicort but insurance will no longer cover.  Started on Dulera last ov but  Has not taken rx back to pharmacy, WE tried to call today but pharmacist said it would take a while to run coverage b/c she did not have pt information. Pt agreed to take rx by pharmacy with her insurance cards.  Denies chest pain, orthopnea, edema or hemoptysis.  Labs showed elevated eosinophils on cbc . Allergy profile did not result, lab contacted, results pending.  rec Continue on Dulera 200 2 puffs Twice daily  , rinse after use.  Take prescription by your pharmacy with your insurance card, call if this is not covered.  Augmentin 875mg  Twice daily  For 10 days  Mucinex Twice daily  As needed  Cough/congestion  Fluids and rest    03/26/2014 f/u ov/[name] re: dtca asthma/ poor hfa  Chief Complaint  Patient presents with  . Follow-up    Pt still c/o nasal congestion. Her breathing is unchanged since  the last visit. She has prod cough with minimal yellow sputum.   no better p augmentin, still with lots of nasal obst symptoms s sinus pain, ha, dental issues.  Maintained on dulera (poor hfa) and singulair and still need sev times per day duoneb.  No obvious day to day or daytime variabilty or assoc cp or chest tightness, subjective wheeze overt sinus or hb symptoms. No unusual exp hx or h/o childhood pna/ asthma or knowledge of premature birth.  Sleeping ok without nocturnal  or early am exacerbation  of respiratory  c/o's or need for noct saba. Also denies any obvious fluctuation of symptoms with weather or environmental changes or other aggravating or alleviating factors except as outlined above   Current Medications,  Allergies, Complete Past Medical History, Past Surgical History, Family History, and Social History were reviewed in [employer] record.  ROS  The following are not active complaints unless bolded sore throat, dysphagia, dental problems, itching, sneezing,  nasal congestion or excess/ purulent secretions, ear ache,   fever, chills, sweats, unintended wt loss, pleuritic or exertional cp, hemoptysis,  orthopnea pnd or leg swelling, presyncope, palpitations, heartburn, abdominal pain, anorexia, nausea, vomiting, diarrhea  or change in bowel or urinary habits, change in stools or urine, dysuria,hematuria,  rash, arthralgias, visual complaints, headache, numbness weakness or ataxia or problems with walking or coordination,  change in mood/affect or memory.                    Objective:   Physical Exam    BaselineWt 251 08/05/11  03/12/14 284  >   03/26/2014  278   amb obese bf nad by nasal tone to voice   HEENT: nl dentition,  and orophanx. Nl external ear canals without cough reflex. Moderate turbinate edema with non-specific features   NECK :  without JVD/Nodes/TM/ nl carotid upstrokes bilaterally   LUNGS: no acc muscle use, trace end exp wheeze only     CV:  RRR  no s3 or murmur or increase in P2, no edema   ABD:  soft and nontender with nl excursion in the supine position. No bruits or organomegaly, bowel sounds nl  MS:  warm without deformities, calf tenderness, cyanosis or clubbing  SKIN: warm and dry without lesions           cxr 12/04/13 The heart size and mediastinal contours are within normal limits.  Both lungs are clear. The visualized skeletal structures are  unremarkable.      Assessment:

## 2014-03-26 NOTE — Patient Instructions (Addendum)
Medrol 4 mg    4 x 2 days, then 3 x 2 days, then 2 x 2 days, then 1 x 2 days and off   Please see patient coordinator before you leave today  to schedule sinus CT   Work on inhaler technique:  relax and gently blow all the way out then take a nice smooth deep breath back in, triggering the inhaler at same time you start breathing in.  Hold for up to 5 seconds if you can.  Rinse and gargle with water when done  See [name] w/in 2 weeks with all your medications, even over the counter meds, separated in two separate bags, the ones you take no matter what vs the ones you stop once you feel better and take only as needed when you feel you need them and bring in your insurance formulary

## 2014-03-26 NOTE — Assessment & Plan Note (Signed)
-   allergy profile 02/26/2014 >  [id number] Pos Dog >mold > grass   DDX of  difficult airways management all start with A and  include Adherence, Ace Inhibitors, Acid Reflux, Active Sinus Disease, Alpha 1 Antitripsin deficiency, Anxiety masquerading as Airways dz,  ABPA,  allergy(esp in young), Aspiration (esp in elderly), Adverse effects of DPI,  Active smokers, plus two Bs  = Bronchiectasis and Beta blocker use..and one C= CHF  Adherence is always the initial "prime suspect" and is a multilayered concern that requires a "trust but verify" approach in every patient - starting with knowing how to use medications, especially inhalers, correctly, keeping up with refills and understanding the fundamental difference between maintenance and prns vs those medications only taken for a very short course and then stopped and not refilled.  The proper method of use, as well as anticipated side effects, of a metered-dose inhaler are discussed and demonstrated to the patient. Improved effectiveness after extensive coaching during this visit to a level of approximately  75% but not at all consistent> continue dulera 20 2bid for now  Active sinusitis > see sinusitis  Allergic rhinitis/asthma > continue singulair for now but low threshold to stop once address sinus dz and rx acutely with 8 d of medrol    Each maintenance medication was reviewed in detail including most importantly the difference between maintenance and as needed and under what circumstances the prns are to be used.  Please see instructions for details which were reviewed in writing and the patient given a copy.    F/u 2 weeks for med reconciliatoin

## 2014-03-28 ENCOUNTER — Other Ambulatory Visit: Payer: Self-pay | Admitting: Internal Medicine

## 2014-03-28 DIAGNOSIS — J019 Acute sinusitis, unspecified: Secondary | ICD-10-CM

## 2014-03-28 NOTE — Progress Notes (Signed)
Quick Note:  Spoke with pt and notified of results per Dr. Melvyn Novas. Pt verbalized understanding and denied any questions. STAT referral sent ______

## 2014-04-09 ENCOUNTER — Ambulatory Visit: Payer: Medicare Other | Admitting: Adult Health

## 2014-04-13 ENCOUNTER — Encounter: Payer: Self-pay | Admitting: Adult Health

## 2014-05-14 ENCOUNTER — Encounter: Payer: Self-pay | Admitting: Internal Medicine

## 2014-07-13 HISTORY — PX: NASAL POLYP EXCISION: SHX2068

## 2014-07-26 ENCOUNTER — Encounter (HOSPITAL_COMMUNITY): Payer: Self-pay | Admitting: General Surgery

## 2014-09-19 ENCOUNTER — Ambulatory Visit: Payer: Medicare Other | Admitting: Obstetrics & Gynecology

## 2014-11-07 ENCOUNTER — Encounter: Payer: Self-pay | Admitting: Obstetrics & Gynecology

## 2014-11-07 ENCOUNTER — Ambulatory Visit (INDEPENDENT_AMBULATORY_CARE_PROVIDER_SITE_OTHER): Payer: 59 | Admitting: Obstetrics & Gynecology

## 2014-11-07 VITALS — BP 137/85 | HR 104 | Temp 98.4°F | Resp 20 | Ht 62.0 in | Wt 274.9 lb

## 2014-11-07 DIAGNOSIS — Z01419 Encounter for gynecological examination (general) (routine) without abnormal findings: Secondary | ICD-10-CM

## 2014-11-07 NOTE — Progress Notes (Signed)
Pt states she is here for annual Gyn exam. She is having no problems. Last Pap 08/18/12 - normal, Neg HPV

## 2014-11-07 NOTE — Patient Instructions (Signed)
Preventive Care for Adults A healthy lifestyle and preventive care can promote health and wellness. Preventive health guidelines for women include the following key practices.  A routine yearly physical is a good way to check with your health care provider about your health and preventive screening. It is a chance to share any concerns and updates on your health and to receive a thorough exam.  Visit your dentist for a routine exam and preventive care every 6 months. Brush your teeth twice a day and floss once a day. Good oral hygiene prevents tooth decay and gum disease.  The frequency of eye exams is based on your age, health, family medical history, use of contact lenses, and other factors. Follow your health care provider's recommendations for frequency of eye exams.  Eat a healthy diet. Foods like vegetables, fruits, whole grains, low-fat dairy products, and lean protein foods contain the nutrients you need without too many calories. Decrease your intake of foods high in solid fats, added sugars, and salt. Eat the right amount of calories for you.Get information about a proper diet from your health care provider, if necessary.  Regular physical exercise is one of the most important things you can do for your health. Most adults should get at least 150 minutes of moderate-intensity exercise (any activity that increases your heart rate and causes you to sweat) each week. In addition, most adults need muscle-strengthening exercises on 2 or more days a week.  Maintain a healthy weight. The body mass index (BMI) is a screening tool to identify possible weight problems. It provides an estimate of body fat based on height and weight. Your health care provider can find your BMI and can help you achieve or maintain a healthy weight.For adults 20 years and older:  A BMI below 18.5 is considered underweight.  A BMI of 18.5 to 24.9 is normal.  A BMI of 25 to 29.9 is considered overweight.  A BMI of  30 and above is considered obese.  Maintain normal blood lipids and cholesterol levels by exercising and minimizing your intake of saturated fat. Eat a balanced diet with plenty of fruit and vegetables. Blood tests for lipids and cholesterol should begin at age 76 and be repeated every 5 years. If your lipid or cholesterol levels are high, you are over 50, or you are at high risk for heart disease, you may need your cholesterol levels checked more frequently.Ongoing high lipid and cholesterol levels should be treated with medicines if diet and exercise are not working.  If you smoke, find out from your health care provider how to quit. If you do not use tobacco, do not start.  Lung cancer screening is recommended for adults aged 22-80 years who are at high risk for developing lung cancer because of a history of smoking. A yearly low-dose CT scan of the lungs is recommended for people who have at least a 30-pack-year history of smoking and are a current smoker or have quit within the past 15 years. A pack year of smoking is smoking an average of 1 pack of cigarettes a day for 1 year (for example: 1 pack a day for 30 years or 2 packs a day for 15 years). Yearly screening should continue until the smoker has stopped smoking for at least 15 years. Yearly screening should be stopped for people who develop a health problem that would prevent them from having lung cancer treatment.  If you are pregnant, do not drink alcohol. If you are breastfeeding,  be very cautious about drinking alcohol. If you are not pregnant and choose to drink alcohol, do not have more than 1 drink per day. One drink is considered to be 12 ounces (355 mL) of beer, 5 ounces (148 mL) of wine, or 1.5 ounces (44 mL) of liquor.  Avoid use of street drugs. Do not share needles with anyone. Ask for help if you need support or instructions about stopping the use of drugs.  High blood pressure causes heart disease and increases the risk of  stroke. Your blood pressure should be checked at least every 1 to 2 years. Ongoing high blood pressure should be treated with medicines if weight loss and exercise do not work.  If you are 75-52 years old, ask your health care provider if you should take aspirin to prevent strokes.  Diabetes screening involves taking a blood sample to check your fasting blood sugar level. This should be done once every 3 years, after age 15, if you are within normal weight and without risk factors for diabetes. Testing should be considered at a younger age or be carried out more frequently if you are overweight and have at least 1 risk factor for diabetes.  Breast cancer screening is essential preventive care for women. You should practice "breast self-awareness." This means understanding the normal appearance and feel of your breasts and may include breast self-examination. Any changes detected, no matter how small, should be reported to a health care provider. Women in their 58s and 30s should have a clinical breast exam (CBE) by a health care provider as part of a regular health exam every 1 to 3 years. After age 16, women should have a CBE every year. Starting at age 53, women should consider having a mammogram (breast X-ray test) every year. Women who have a family history of breast cancer should talk to their health care provider about genetic screening. Women at a high risk of breast cancer should talk to their health care providers about having an MRI and a mammogram every year.  Breast cancer gene (BRCA)-related cancer risk assessment is recommended for women who have family members with BRCA-related cancers. BRCA-related cancers include breast, ovarian, tubal, and peritoneal cancers. Having family members with these cancers may be associated with an increased risk for harmful changes (mutations) in the breast cancer genes BRCA1 and BRCA2. Results of the assessment will determine the need for genetic counseling and  BRCA1 and BRCA2 testing.  Routine pelvic exams to screen for cancer are no longer recommended for nonpregnant women who are considered low risk for cancer of the pelvic organs (ovaries, uterus, and vagina) and who do not have symptoms. Ask your health care provider if a screening pelvic exam is right for you.  If you have had past treatment for cervical cancer or a condition that could lead to cancer, you need Pap tests and screening for cancer for at least 20 years after your treatment. If Pap tests have been discontinued, your risk factors (such as having a new sexual partner) need to be reassessed to determine if screening should be resumed. Some women have medical problems that increase the chance of getting cervical cancer. In these cases, your health care provider may recommend more frequent screening and Pap tests.  The HPV test is an additional test that may be used for cervical cancer screening. The HPV test looks for the virus that can cause the cell changes on the cervix. The cells collected during the Pap test can be  tested for HPV. The HPV test could be used to screen women aged 30 years and older, and should be used in women of any age who have unclear Pap test results. After the age of 30, women should have HPV testing at the same frequency as a Pap test.  Colorectal cancer can be detected and often prevented. Most routine colorectal cancer screening begins at the age of 50 years and continues through age 75 years. However, your health care provider may recommend screening at an earlier age if you have risk factors for colon cancer. On a yearly basis, your health care provider may provide home test kits to check for hidden blood in the stool. Use of a small camera at the end of a tube, to directly examine the colon (sigmoidoscopy or colonoscopy), can detect the earliest forms of colorectal cancer. Talk to your health care provider about this at age 50, when routine screening begins. Direct  exam of the colon should be repeated every 5-10 years through age 75 years, unless early forms of pre-cancerous polyps or small growths are found.  People who are at an increased risk for hepatitis B should be screened for this virus. You are considered at high risk for hepatitis B if:  You were born in a country where hepatitis B occurs often. Talk with your health care provider about which countries are considered high risk.  Your parents were born in a high-risk country and you have not received a shot to protect against hepatitis B (hepatitis B vaccine).  You have HIV or AIDS.  You use needles to inject street drugs.  You live with, or have sex with, someone who has hepatitis B.  You get hemodialysis treatment.  You take certain medicines for conditions like cancer, organ transplantation, and autoimmune conditions.  Hepatitis C blood testing is recommended for all people born from 1945 through 1965 and any individual with known risks for hepatitis C.  Practice safe sex. Use condoms and avoid high-risk sexual practices to reduce the spread of sexually transmitted infections (STIs). STIs include gonorrhea, chlamydia, syphilis, trichomonas, herpes, HPV, and human immunodeficiency virus (HIV). Herpes, HIV, and HPV are viral illnesses that have no cure. They can result in disability, cancer, and death.  You should be screened for sexually transmitted illnesses (STIs) including gonorrhea and chlamydia if:  You are sexually active and are younger than 24 years.  You are older than 24 years and your health care provider tells you that you are at risk for this type of infection.  Your sexual activity has changed since you were last screened and you are at an increased risk for chlamydia or gonorrhea. Ask your health care provider if you are at risk.  If you are at risk of being infected with HIV, it is recommended that you take a prescription medicine daily to prevent HIV infection. This is  called preexposure prophylaxis (PrEP). You are considered at risk if:  You are a heterosexual woman, are sexually active, and are at increased risk for HIV infection.  You take drugs by injection.  You are sexually active with a partner who has HIV.  Talk with your health care provider about whether you are at high risk of being infected with HIV. If you choose to begin PrEP, you should first be tested for HIV. You should then be tested every 3 months for as long as you are taking PrEP.  Osteoporosis is a disease in which the bones lose minerals and strength   with aging. This can result in serious bone fractures or breaks. The risk of osteoporosis can be identified using a bone density scan. Women ages 65 years and over and women at risk for fractures or osteoporosis should discuss screening with their health care providers. Ask your health care provider whether you should take a calcium supplement or vitamin D to reduce the rate of osteoporosis.  Menopause can be associated with physical symptoms and risks. Hormone replacement therapy is available to decrease symptoms and risks. You should talk to your health care provider about whether hormone replacement therapy is right for you.  Use sunscreen. Apply sunscreen liberally and repeatedly throughout the day. You should seek shade when your shadow is shorter than you. Protect yourself by wearing long sleeves, pants, a wide-brimmed hat, and sunglasses year round, whenever you are outdoors.  Once a month, do a whole body skin exam, using a mirror to look at the skin on your back. Tell your health care provider of new moles, moles that have irregular borders, moles that are larger than a pencil eraser, or moles that have changed in shape or color.  Stay current with required vaccines (immunizations).  Influenza vaccine. All adults should be immunized every year.  Tetanus, diphtheria, and acellular pertussis (Td, Tdap) vaccine. Pregnant women should  receive 1 dose of Tdap vaccine during each pregnancy. The dose should be obtained regardless of the length of time since the last dose. Immunization is preferred during the 27th-36th week of gestation. An adult who has not previously received Tdap or who does not know her vaccine status should receive 1 dose of Tdap. This initial dose should be followed by tetanus and diphtheria toxoids (Td) booster doses every 10 years. Adults with an unknown or incomplete history of completing a 3-dose immunization series with Td-containing vaccines should begin or complete a primary immunization series including a Tdap dose. Adults should receive a Td booster every 10 years.  Varicella vaccine. An adult without evidence of immunity to varicella should receive 2 doses or a second dose if she has previously received 1 dose. Pregnant females who do not have evidence of immunity should receive the first dose after pregnancy. This first dose should be obtained before leaving the health care facility. The second dose should be obtained 4-8 weeks after the first dose.  Human papillomavirus (HPV) vaccine. Females aged 13-26 years who have not received the vaccine previously should obtain the 3-dose series. The vaccine is not recommended for use in pregnant females. However, pregnancy testing is not needed before receiving a dose. If a female is found to be pregnant after receiving a dose, no treatment is needed. In that case, the remaining doses should be delayed until after the pregnancy. Immunization is recommended for any person with an immunocompromised condition through the age of 26 years if she did not get any or all doses earlier. During the 3-dose series, the second dose should be obtained 4-8 weeks after the first dose. The third dose should be obtained 24 weeks after the first dose and 16 weeks after the second dose.  Zoster vaccine. One dose is recommended for adults aged 60 years or older unless certain conditions are  present.  Measles, mumps, and rubella (MMR) vaccine. Adults born before 1957 generally are considered immune to measles and mumps. Adults born in 1957 or later should have 1 or more doses of MMR vaccine unless there is a contraindication to the vaccine or there is laboratory evidence of immunity to   each of the three diseases. A routine second dose of MMR vaccine should be obtained at least 28 days after the first dose for students attending postsecondary schools, health care workers, or international travelers. People who received inactivated measles vaccine or an unknown type of measles vaccine during 1963-1967 should receive 2 doses of MMR vaccine. People who received inactivated mumps vaccine or an unknown type of mumps vaccine before 1979 and are at high risk for mumps infection should consider immunization with 2 doses of MMR vaccine. For females of childbearing age, rubella immunity should be determined. If there is no evidence of immunity, females who are not pregnant should be vaccinated. If there is no evidence of immunity, females who are pregnant should delay immunization until after pregnancy. Unvaccinated health care workers born before 1957 who lack laboratory evidence of measles, mumps, or rubella immunity or laboratory confirmation of disease should consider measles and mumps immunization with 2 doses of MMR vaccine or rubella immunization with 1 dose of MMR vaccine.  Pneumococcal 13-valent conjugate (PCV13) vaccine. When indicated, a person who is uncertain of her immunization history and has no record of immunization should receive the PCV13 vaccine. An adult aged 19 years or older who has certain medical conditions and has not been previously immunized should receive 1 dose of PCV13 vaccine. This PCV13 should be followed with a dose of pneumococcal polysaccharide (PPSV23) vaccine. The PPSV23 vaccine dose should be obtained at least 8 weeks after the dose of PCV13 vaccine. An adult aged 19  years or older who has certain medical conditions and previously received 1 or more doses of PPSV23 vaccine should receive 1 dose of PCV13. The PCV13 vaccine dose should be obtained 1 or more years after the last PPSV23 vaccine dose.  Pneumococcal polysaccharide (PPSV23) vaccine. When PCV13 is also indicated, PCV13 should be obtained first. All adults aged 65 years and older should be immunized. An adult younger than age 65 years who has certain medical conditions should be immunized. Any person who resides in a nursing home or long-term care facility should be immunized. An adult smoker should be immunized. People with an immunocompromised condition and certain other conditions should receive both PCV13 and PPSV23 vaccines. People with human immunodeficiency virus (HIV) infection should be immunized as soon as possible after diagnosis. Immunization during chemotherapy or radiation therapy should be avoided. Routine use of PPSV23 vaccine is not recommended for American Indians, [place] Natives, or people younger than 65 years unless there are medical conditions that require PPSV23 vaccine. When indicated, people who have unknown immunization and have no record of immunization should receive PPSV23 vaccine. One-time revaccination 5 years after the first dose of PPSV23 is recommended for people aged 19-64 years who have chronic kidney failure, nephrotic syndrome, asplenia, or immunocompromised conditions. People who received 1-2 doses of PPSV23 before age 65 years should receive another dose of PPSV23 vaccine at age 65 years or later if at least 5 years have passed since the previous dose. Doses of PPSV23 are not needed for people immunized with PPSV23 at or after age 65 years.  Meningococcal vaccine. Adults with asplenia or persistent complement component deficiencies should receive 2 doses of quadrivalent meningococcal conjugate (MenACWY-D) vaccine. The doses should be obtained at least 2 months apart.  Microbiologists working with certain meningococcal bacteria, military recruits, people at risk during an outbreak, and people who travel to or live in countries with a high rate of meningitis should be immunized. A first-year college student up through age   21 years who is living in a residence hall should receive a dose if she did not receive a dose on or after her 16th birthday. Adults who have certain high-risk conditions should receive one or more doses of vaccine.  Hepatitis A vaccine. Adults who wish to be protected from this disease, have certain high-risk conditions, work with hepatitis A-infected animals, work in hepatitis A research labs, or travel to or work in countries with a high rate of hepatitis A should be immunized. Adults who were previously unvaccinated and who anticipate close contact with an international adoptee during the first 60 days after arrival in the [place] from a country with a high rate of hepatitis A should be immunized.  Hepatitis B vaccine. Adults who wish to be protected from this disease, have certain high-risk conditions, may be exposed to blood or other infectious body fluids, are household contacts or sex partners of hepatitis B positive people, are clients or workers in certain care facilities, or travel to or work in countries with a high rate of hepatitis B should be immunized.  Haemophilus influenzae type b (Hib) vaccine. A previously unvaccinated person with asplenia or sickle cell disease or having a scheduled splenectomy should receive 1 dose of Hib vaccine. Regardless of previous immunization, a recipient of a hematopoietic stem cell transplant should receive a 3-dose series 6-12 months after her successful transplant. Hib vaccine is not recommended for adults with HIV infection. Preventive Services / Frequency Ages 64 to 68 years  Blood pressure check.** / Every 1 to 2 years.  Lipid and cholesterol check.** / Every 5 years beginning at age  22.  Clinical breast exam.** / Every 3 years for women in their 88s and 53s.  BRCA-related cancer risk assessment.** / For women who have family members with a BRCA-related cancer (breast, ovarian, tubal, or peritoneal cancers).  Pap test.** / Every 2 years from ages 90 through 51. Every 3 years starting at age 21 through age 56 or 3 with a history of 3 consecutive normal Pap tests.  HPV screening.** / Every 3 years from ages 24 through ages 1 to 46 with a history of 3 consecutive normal Pap tests.  Hepatitis C blood test.** / For any individual with known risks for hepatitis C.  Skin self-exam. / Monthly.  Influenza vaccine. / Every year.  Tetanus, diphtheria, and acellular pertussis (Tdap, Td) vaccine.** / Consult your health care provider. Pregnant women should receive 1 dose of Tdap vaccine during each pregnancy. 1 dose of Td every 10 years.  Varicella vaccine.** / Consult your health care provider. Pregnant females who do not have evidence of immunity should receive the first dose after pregnancy.  HPV vaccine. / 3 doses over 6 months, if 72 and younger. The vaccine is not recommended for use in pregnant females. However, pregnancy testing is not needed before receiving a dose.  Measles, mumps, rubella (MMR) vaccine.** / You need at least 1 dose of MMR if you were born in 1957 or later. You may also need a 2nd dose. For females of childbearing age, rubella immunity should be determined. If there is no evidence of immunity, females who are not pregnant should be vaccinated. If there is no evidence of immunity, females who are pregnant should delay immunization until after pregnancy.  Pneumococcal 13-valent conjugate (PCV13) vaccine.** / Consult your health care provider.  Pneumococcal polysaccharide (PPSV23) vaccine.** / 1 to 2 doses if you smoke cigarettes or if you have certain conditions.  Meningococcal vaccine.** /  1 dose if you are age 19 to 21 years and a first-year college  student living in a residence hall, or have one of several medical conditions, you need to get vaccinated against meningococcal disease. You may also need additional booster doses.  Hepatitis A vaccine.** / Consult your health care provider.  Hepatitis B vaccine.** / Consult your health care provider.  Haemophilus influenzae type b (Hib) vaccine.** / Consult your health care provider. Ages 40 to 64 years  Blood pressure check.** / Every 1 to 2 years.  Lipid and cholesterol check.** / Every 5 years beginning at age 20 years.  Lung cancer screening. / Every year if you are aged 55-80 years and have a 30-pack-year history of smoking and currently smoke or have quit within the past 15 years. Yearly screening is stopped once you have quit smoking for at least 15 years or develop a health problem that would prevent you from having lung cancer treatment.  Clinical breast exam.** / Every year after age 40 years.  BRCA-related cancer risk assessment.** / For women who have family members with a BRCA-related cancer (breast, ovarian, tubal, or peritoneal cancers).  Mammogram.** / Every year beginning at age 40 years and continuing for as long as you are in good health. Consult with your health care provider.  Pap test.** / Every 3 years starting at age 30 years through age 65 or 70 years with a history of 3 consecutive normal Pap tests.  HPV screening.** / Every 3 years from ages 30 years through ages 65 to 70 years with a history of 3 consecutive normal Pap tests.  Fecal occult blood test (FOBT) of stool. / Every year beginning at age 50 years and continuing until age 75 years. You may not need to do this test if you get a colonoscopy every 10 years.  Flexible sigmoidoscopy or colonoscopy.** / Every 5 years for a flexible sigmoidoscopy or every 10 years for a colonoscopy beginning at age 50 years and continuing until age 75 years.  Hepatitis C blood test.** / For all people born from 1945 through  1965 and any individual with known risks for hepatitis C.  Skin self-exam. / Monthly.  Influenza vaccine. / Every year.  Tetanus, diphtheria, and acellular pertussis (Tdap/Td) vaccine.** / Consult your health care provider. Pregnant women should receive 1 dose of Tdap vaccine during each pregnancy. 1 dose of Td every 10 years.  Varicella vaccine.** / Consult your health care provider. Pregnant females who do not have evidence of immunity should receive the first dose after pregnancy.  Zoster vaccine.** / 1 dose for adults aged 60 years or older.  Measles, mumps, rubella (MMR) vaccine.** / You need at least 1 dose of MMR if you were born in 1957 or later. You may also need a 2nd dose. For females of childbearing age, rubella immunity should be determined. If there is no evidence of immunity, females who are not pregnant should be vaccinated. If there is no evidence of immunity, females who are pregnant should delay immunization until after pregnancy.  Pneumococcal 13-valent conjugate (PCV13) vaccine.** / Consult your health care provider.  Pneumococcal polysaccharide (PPSV23) vaccine.** / 1 to 2 doses if you smoke cigarettes or if you have certain conditions.  Meningococcal vaccine.** / Consult your health care provider.  Hepatitis A vaccine.** / Consult your health care provider.  Hepatitis B vaccine.** / Consult your health care provider.  Haemophilus influenzae type b (Hib) vaccine.** / Consult your health care provider. Ages 65   years and over  Blood pressure check.** / Every 1 to 2 years.  Lipid and cholesterol check.** / Every 5 years beginning at age 22 years.  Lung cancer screening. / Every year if you are aged 73-80 years and have a 30-pack-year history of smoking and currently smoke or have quit within the past 15 years. Yearly screening is stopped once you have quit smoking for at least 15 years or develop a health problem that would prevent you from having lung cancer  treatment.  Clinical breast exam.** / Every year after age 4 years.  BRCA-related cancer risk assessment.** / For women who have family members with a BRCA-related cancer (breast, ovarian, tubal, or peritoneal cancers).  Mammogram.** / Every year beginning at age 40 years and continuing for as long as you are in good health. Consult with your health care provider.  Pap test.** / Every 3 years starting at age 9 years through age 34 or 91 years with 3 consecutive normal Pap tests. Testing can be stopped between 65 and 70 years with 3 consecutive normal Pap tests and no abnormal Pap or HPV tests in the past 10 years.  HPV screening.** / Every 3 years from ages 57 years through ages 64 or 45 years with a history of 3 consecutive normal Pap tests. Testing can be stopped between 65 and 70 years with 3 consecutive normal Pap tests and no abnormal Pap or HPV tests in the past 10 years.  Fecal occult blood test (FOBT) of stool. / Every year beginning at age 15 years and continuing until age 17 years. You may not need to do this test if you get a colonoscopy every 10 years.  Flexible sigmoidoscopy or colonoscopy.** / Every 5 years for a flexible sigmoidoscopy or every 10 years for a colonoscopy beginning at age 86 years and continuing until age 71 years.  Hepatitis C blood test.** / For all people born from [birth year] through 1966 and any individual with known risks for hepatitis C.  Osteoporosis screening.** / A one-time screening for women ages 83 years and over and women at risk for fractures or osteoporosis.  Skin self-exam. / Monthly.  Influenza vaccine. / Every year.  Tetanus, diphtheria, and acellular pertussis (Tdap/Td) vaccine.** / 1 dose of Td every 10 years.  Varicella vaccine.** / Consult your health care provider.  Zoster vaccine.** / 1 dose for adults aged 61 years or older.  Pneumococcal 13-valent conjugate (PCV13) vaccine.** / Consult your health care provider.  Pneumococcal  polysaccharide (PPSV23) vaccine.** / 1 dose for all adults aged 28 years and older.  Meningococcal vaccine.** / Consult your health care provider.  Hepatitis A vaccine.** / Consult your health care provider.  Hepatitis B vaccine.** / Consult your health care provider.  Haemophilus influenzae type b (Hib) vaccine.** / Consult your health care provider. ** Family history and personal history of risk and conditions may change your health care provider's recommendations. Document Released: 08/25/2001 Document Revised: 11/13/2013 Document Reviewed: 11/24/2010 [facility] Patient Information 2015 [place], [place]. This information is not intended to replace advice given to you by your health care provider. Make sure you discuss any questions you have with your health care provider.

## 2014-11-07 NOTE — Progress Notes (Signed)
GYNECOLOGY CLINIC ANNUAL PREVENTATIVE CARE ENCOUNTER NOTE  Subjective:     [name] is a 39 y.o. [id number] female here for a routine annual gynecologic exam.  Current complaints: none.  Will try to conceive soon, on prenatal vitamins.  Had abdominal myomectomy in 2014 and will need cesarean delivery due to extent of fibroids removed (refer to operative note on 11/14/2012).    Gynecologic History Patient's last menstrual period was 10/12/2014. Contraception: none Last Pap: 08/18/12. Results were: normal with negative HRHPV  Obstetric History OB History  Gravida Para Term Preterm AB SAB TAB Ectopic Multiple Living  1    1   1       # Outcome Date GA Lbr Len/2nd Weight Sex Delivery Anes PTL Lv  1 Ectopic 2011              Past Medical History  Diagnosis Date  . Asthma   . Environmental allergies   . Fibroids   . Shortness of breath   . GERD (gastroesophageal reflux disease)   . Anemia   . Fibroid, uterine   . Menorrhagia   . Blood transfusion without reported diagnosis     Past Surgical History  Procedure Laterality Date  . Ectopic pregnancy surgery    . Myomectomy    . Cholecystectomy      06/29/2011  . Cholecystectomy  06/29/2011    Procedure: LAPAROSCOPIC CHOLECYSTECTOMY WITH INTRAOPERATIVE CHOLANGIOGRAM;  Surgeon: Merrie Roof, MD;  Location: [place];  Service: General;  Laterality: N/A;  . Ercp  06/30/2011    Procedure: ENDOSCOPIC RETROGRADE CHOLANGIOPANCREATOGRAPHY (ERCP);  Surgeon: Jeryl Columbia, MD;  Location[facility] ENDOSCOPY;  Service: Endoscopy;  Laterality: N/A;  probable sphincterotomy  . Ercp  07/03/2011    Procedure: ENDOSCOPIC RETROGRADE CHOLANGIOPANCREATOGRAPHY (ERCP);  Surgeon: Landry Dyke, MD;  Location: [place];  Service: Endoscopy;  Laterality: N/A;  PROPOFOL  . Myomectomy N/A 11/14/2012    Procedure: MYOMECTOMY;  Surgeon: Osborne Oman, MD;  Location: [facility];  Service: Gynecology;  Laterality: N/A;    Outpatient Encounter Prescriptions as of  11/07/2014  Medication Sig Note  . budesonide-formoterol (SYMBICORT) 160-4.5 MCG/ACT inhaler Inhale 2 puffs into the lungs 2 (two) times daily as needed.   . cetirizine (ZYRTEC) 10 MG tablet Take 10 mg by mouth daily.   . fluticasone (VERAMYST) 27.5 MCG/SPRAY nasal spray Place 2 sprays into the nose 2 (two) times daily.   Marland Kitchen guaiFENesin (MUCINEX) 600 MG 12 hr tablet Take 600 mg by mouth 2 (two) times daily.   Marland Kitchen ipratropium-albuterol (DUONEB) 0.5-2.5 (3) MG/3ML SOLN Take 3 mLs by nebulization every 6 (six) hours as needed.   . methylPREDNISolone (MEDROL) 4 MG tablet Take 4 for two days three for two days two for two days one for two days   . montelukast (SINGULAIR) 10 MG tablet Take 1 tablet (10 mg total) by mouth at bedtime.   . [DISCONTINUED] amLODipine (NORVASC) 5 MG tablet Take 1 tablet (5 mg total) by mouth daily. 02/21/2014: Suppose to be taking but insurance won't cover medication   . [DISCONTINUED] mometasone-formoterol (DULERA) 200-5 MCG/ACT AERO Take 2 puffs first thing in am and then another 2 puffs about 12 hours later.     Allergies  Allergen Reactions  . Bee Venom Anaphylaxis    Patient has an epi pen, also has not been stung before but was informed she is allergic  . Prednisone Hives    Patient has been rechallenged with prednisone and  experienced the same symptoms of hives and face swelling.  She can tolerate methylprednisolone IV and PO.    History   Social History  . Marital Status: Married    Spouse Name: N/A  . Number of Children: N/A  . Years of Education: N/A   Occupational History  . Not on file.   Social History Main Topics  . Smoking status: Never Smoker   . Smokeless tobacco: Never Used  . Alcohol Use: No  . Drug Use: No  . Sexual Activity: Not Currently    Birth Control/ Protection: None   Other Topics Concern  . Not on file   Social History Narrative    Family History  Problem Relation Age of Onset  . Anesthesia problems Neg Hx   . Hypotension  Neg Hx   . Malignant hyperthermia Neg Hx   . Pseudochol deficiency Neg Hx   . Hypertension Mother   . Diabetes Mother   . Hypertension Father   . Hypertension Brother     The following portions of the patient's history were reviewed and updated as appropriate: allergies, current medications, past family history, past medical history, past social history, past surgical history and problem list.  Review of Systems A comprehensive review of systems was negative.   Objective:   BP 137/85 mmHg  Pulse 104  Temp(Src) 98.4 F (36.9 C) (Oral)  Resp 20  Ht 5\' 2"  (1.575 m)  Wt 274 lb 14.4 oz (124.694 kg)  BMI 50.27 kg/m2  LMP 10/12/2014 CONSTITUTIONAL: Well-developed, well-nourished female in no acute distress.  HENT:  Normocephalic, atraumatic, External right and left ear normal. Oropharynx is clear and moist EYES: Conjunctivae and EOM are normal. Pupils are equal, round, and reactive to light. No scleral icterus.  NECK: Normal range of motion, supple, no masses.  Normal thyroid.  SKIN: Skin is warm and dry. No rash noted. Not diaphoretic. No erythema. No pallor. [place]: Alert and oriented to person, place, and time. Normal reflexes, muscle tone coordination. No cranial nerve deficit noted. PSYCHIATRIC: Normal mood and affect. Normal behavior. Normal judgment and thought content. CARDIOVASCULAR: Normal heart rate noted, regular rhythm RESPIRATORY: Clear to auscultation bilaterally. Effort and breath sounds normal, no problems with respiration noted. BREASTS: Symmetric in size. No masses, skin changes, nipple drainage, or lymphadenopathy. ABDOMEN: Soft, obese, normal bowel sounds, no distention appreciated.  No tenderness, rebound or guarding.  PELVIC: Normal appearing external genitalia; internal exam deferred MUSCULOSKELETAL: Normal range of motion. No tenderness.  No cyanosis, clubbing, or edema.  2+ distal pulses.   Assessment:   Annual examination without pap smear   Plan:    Normal exam Continue prenatal vitamins for now, avoid teratogens Continue weight loss efforts to maximize ovulation chances; also encouraged use of ovulation predictor kits Routine preventative health maintenance measures emphasized   Verita Schneiders, MD, FACOG Attending [place] for [place], [place]

## 2014-12-15 IMAGING — CT CT PARANASAL SINUSES LIMITED
1 of 2 series · 15 of 19 positions shown, 19 images · non-contrast
Comparison: June 27, 2012

CLINICAL DATA: Chronic sinus congestion and drainage

EXAM:
CT PARANASAL SINUS WITHOUT CONTRAST - LIMITED
TECHNIQUE: Coronal CT images of the paranasal sinuses were obtained using the
standard protocol without intravenous contrast.

[Series 6: ltd sinus 3.0 h30s · axial · 0.31mm/px · z∈[-76,+19]mm · 15 of 18 slices shown, 19 images]
[im 2/18  brain]
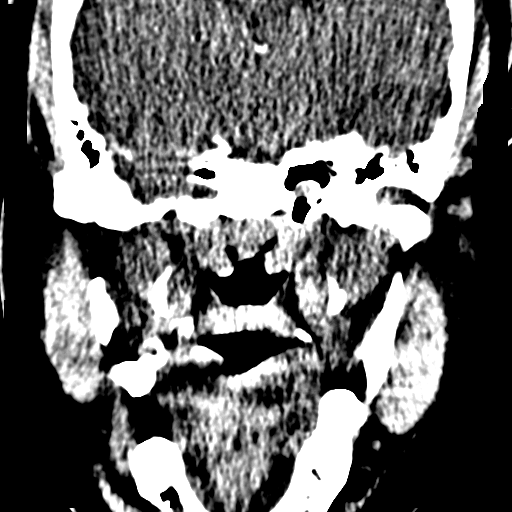
[im 2/18  bone]
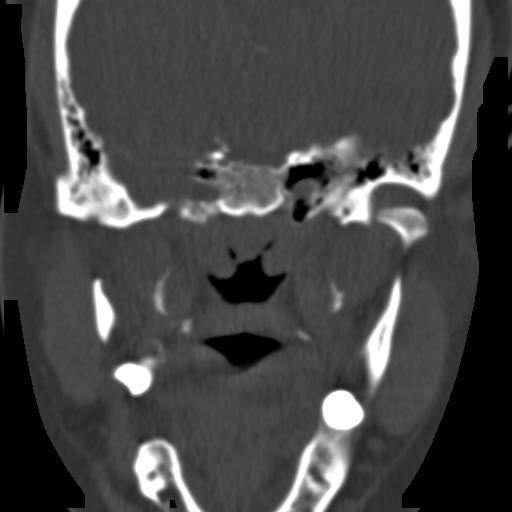
[im 3/18  bone]
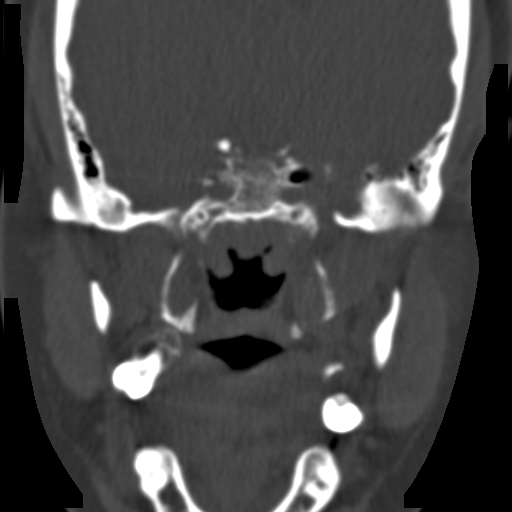
[im 4/18  bone]
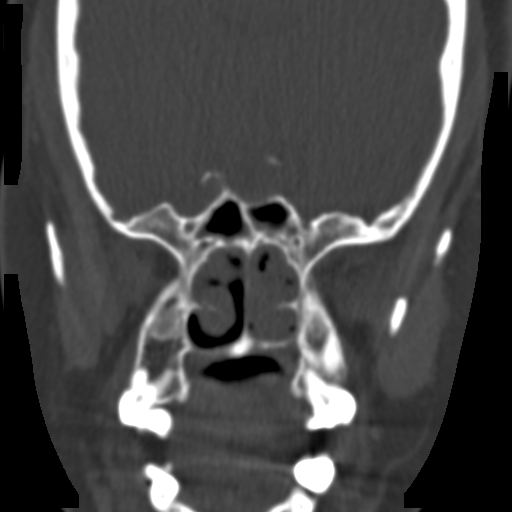
[im 5/18  bone]
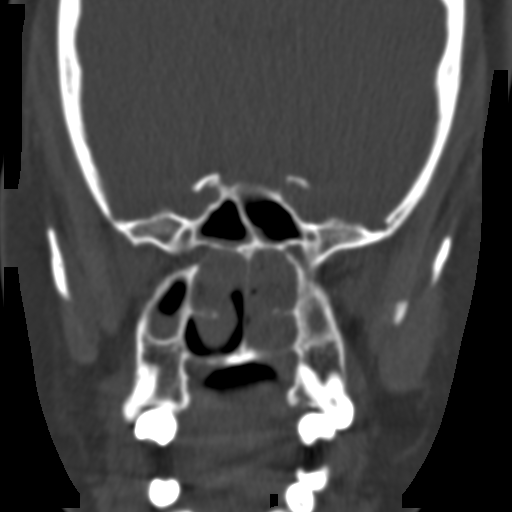
[im 6/18  brain]
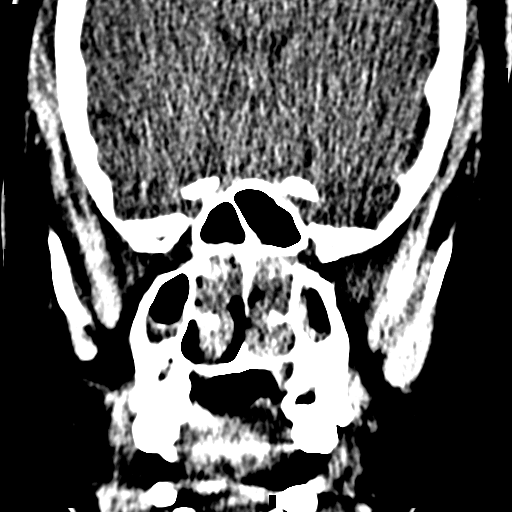
[im 6/18  bone]
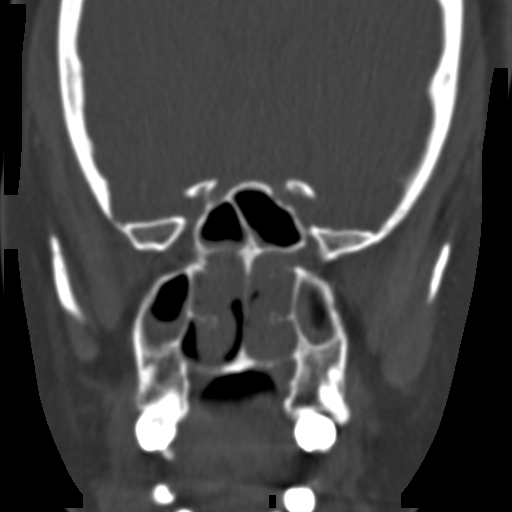
[im 7/18  bone]
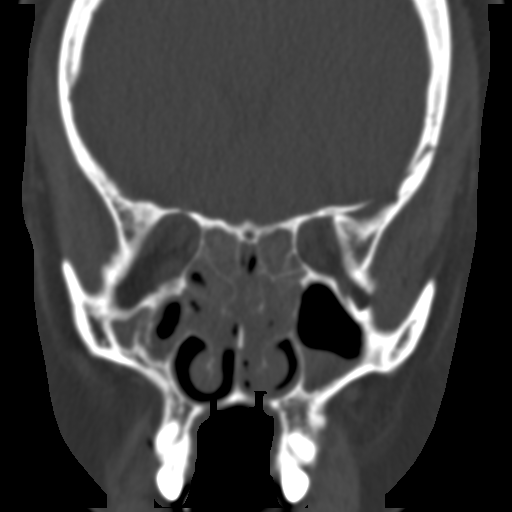
[im 8/18  bone]
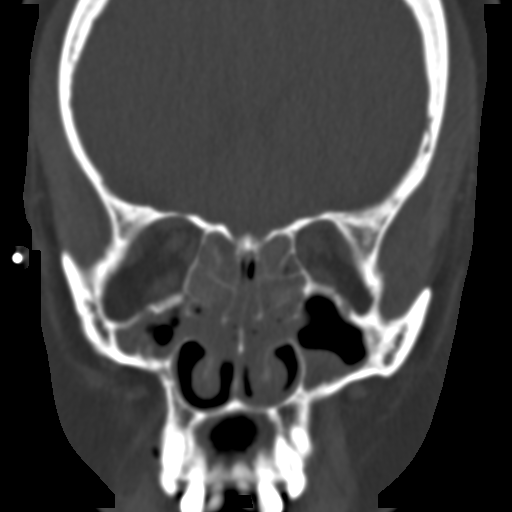
[im 10/18  bone]
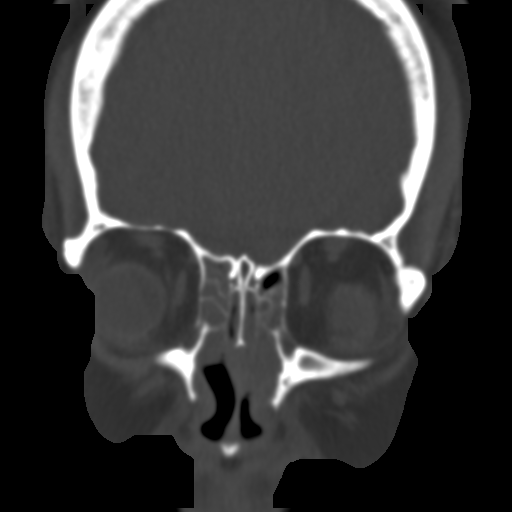
[im 11/18  brain]
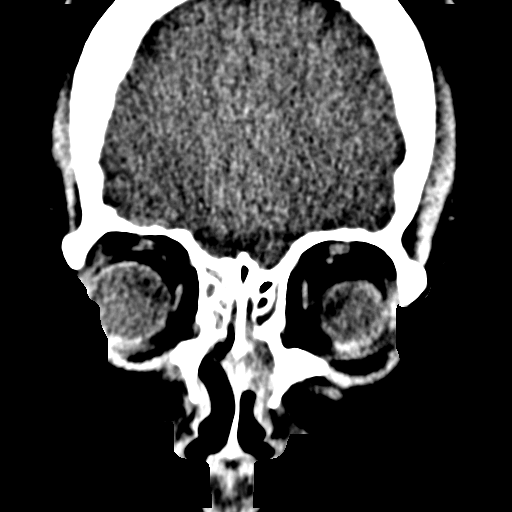
[im 11/18  bone]
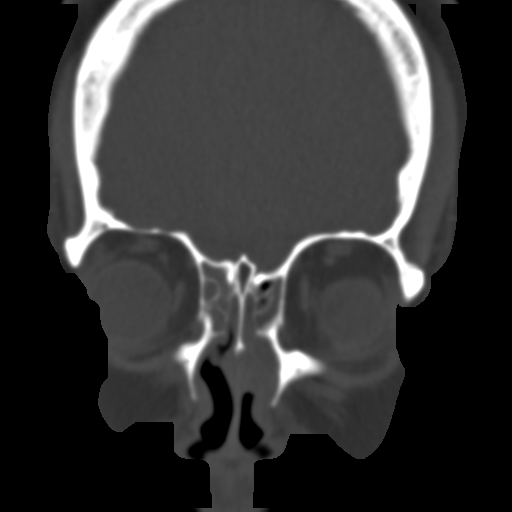
[im 12/18  bone]
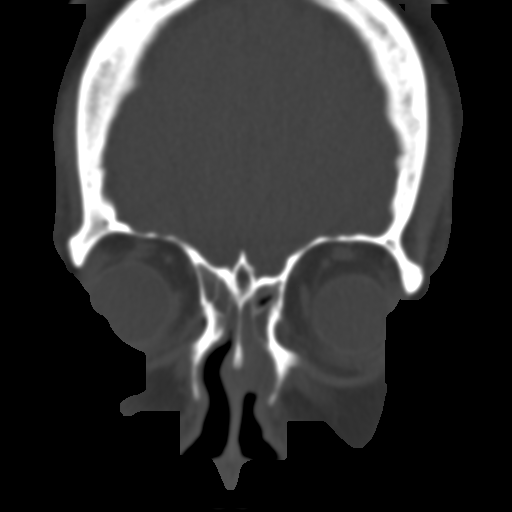
[im 13/18  bone]
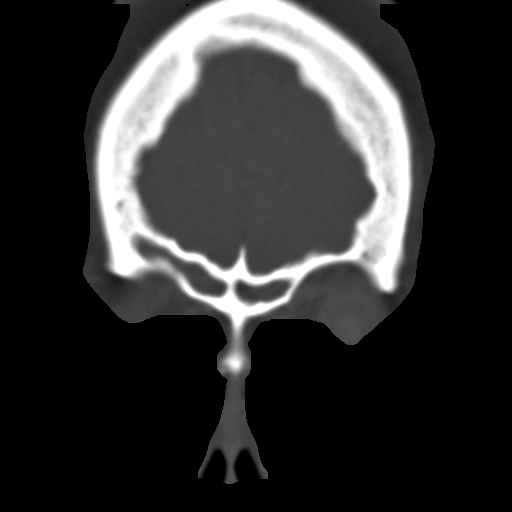
[im 14/18  bone]
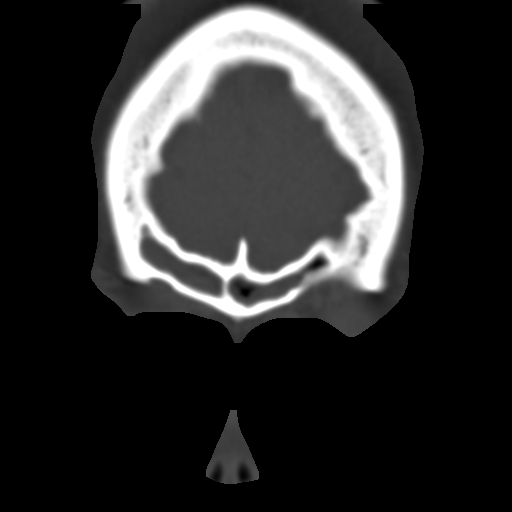
[im 15/18  brain]
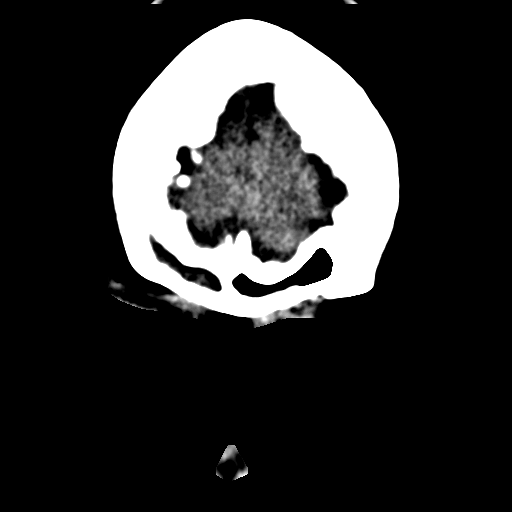
[im 15/18  bone]
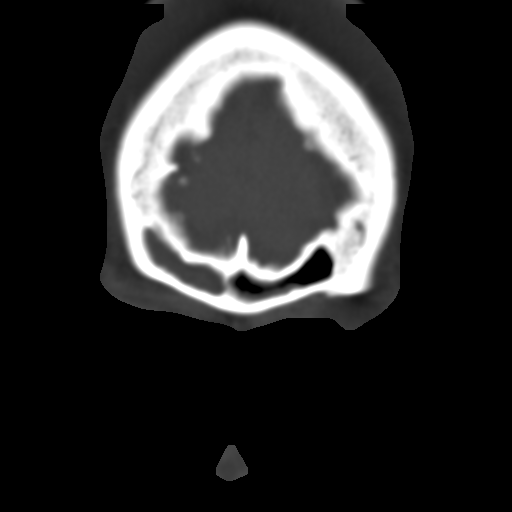
[im 16/18  bone]
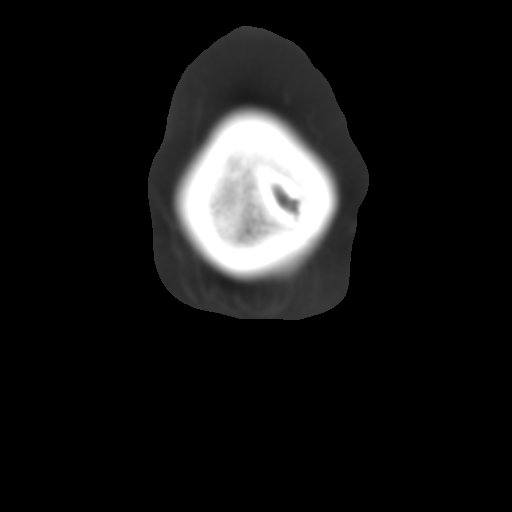
[im 17/18  bone]
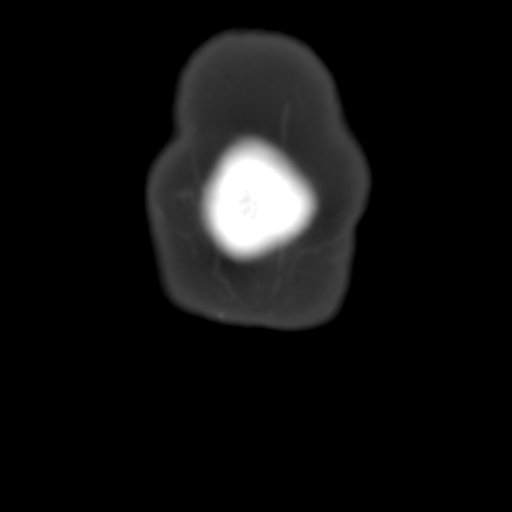

[15 of 19 positions shown; findings below may reference images not displayed]

FINDINGS: There is opacification of or virtually all ethmoid air cells
bilaterally. Multiple areas of bony expansion are noted in the
ethmoid air cell complexes bilaterally, stable. There is an
air-fluid level currently seen in the left maxillary antrum. There
is less opacification in the left maxillary antrum compared to the
prior study. There is extensive opacification of the right maxillary
antrum which is increased compared to the prior study. There is
ostiomeatal unit obstruction bilaterally.

There is moderate mucosal thickening in both sphenoid sinuses,
slightly less than on the prior study. There is extensive
opacification of the entire right frontal sinus with
mild-to-moderate mucosal thickening in the medial left frontal
sinus. There is significantly less frontal sinus opacification
compared to the prior study.

There is no frank bony destruction. There is extensive nasal
polyposis bilaterally, more severe on the left than on the right.
There is nares obstruction bilaterally. The nasal septum is midline.

There is no fracture or dislocation. The visualized brain parenchyma
appears grossly normal.
IMPRESSION: Widespread paranasal sinus disease, most pronounced in the ethmoid
air cell complexes but significant in the right maxillary and right
frontal sinuses is well. There is bilateral ostiomeatal unit complex
obstruction. Expansion of multiple ethmoid air cells appears stable
compared to prior study. Note that there is currently an air-fluid
level in the left maxillary antrum as well as polypoid change in
this area. There is extensive nasal sinus polyposis. The
opacification of the paranasal sinuses as noted above is probably
due to a combination of extensive polyposis and generalized mucosal
thickening.

## 2014-12-20 ENCOUNTER — Encounter (HOSPITAL_COMMUNITY): Payer: Self-pay | Admitting: General Surgery

## 2015-01-07 ENCOUNTER — Other Ambulatory Visit: Payer: Self-pay

## 2015-11-04 ENCOUNTER — Encounter (HOSPITAL_COMMUNITY): Payer: Self-pay

## 2015-11-04 ENCOUNTER — Inpatient Hospital Stay (HOSPITAL_COMMUNITY)
Admission: AD | Admit: 2015-11-04 | Discharge: 2015-11-04 | Disposition: A | Discharge status: Home / Self Care | Payer: 59 | Source: Ambulatory Visit | Attending: Family Medicine | Admitting: Family Medicine

## 2015-11-04 DIAGNOSIS — N898 Other specified noninflammatory disorders of vagina: Secondary | ICD-10-CM | POA: Diagnosis present

## 2015-11-04 DIAGNOSIS — N760 Acute vaginitis: Secondary | ICD-10-CM | POA: Diagnosis not present

## 2015-11-04 LAB — URINALYSIS, ROUTINE W REFLEX MICROSCOPIC
Bilirubin Urine: NEGATIVE
Glucose, UA: NEGATIVE mg/dL
Ketones, ur: NEGATIVE mg/dL
NITRITE: NEGATIVE
Protein, ur: NEGATIVE mg/dL
pH: 5.5 (ref 5.0–8.0)

## 2015-11-04 LAB — WET PREP, GENITAL
CLUE CELLS WET PREP: NONE SEEN
SPERM: NONE SEEN
TRICH WET PREP: NONE SEEN
YEAST WET PREP: NONE SEEN

## 2015-11-04 LAB — URINE MICROSCOPIC-ADD ON

## 2015-11-04 LAB — POCT PREGNANCY, URINE: Preg Test, Ur: NEGATIVE

## 2015-11-04 MED ORDER — FLUCONAZOLE 150 MG PO TABS: 150.0000 mg | ORAL_TABLET | Freq: Every day | ORAL | Status: DC

## 2015-11-04 NOTE — MAU Note (Signed)
Was on an antibiotic for allergies, when takes it usually gets a yeast infection,  Had explained to Dr when prescribed, now called due to yeast infection,requested Diflucan, can not be seen until 5/8.

## 2015-11-04 NOTE — Discharge Instructions (Signed)

## 2015-11-04 NOTE — MAU Provider Note (Signed)
History     CSN: [id number]  Arrival date and time: 11/04/15 1157   First Provider Initiated Contact with Patient 11/04/15 1411      Chief Complaint  Patient presents with  . Vaginal Discharge  . Vaginal Itching   HPI     [name], a 40 year old with a history of asthma and allergies, presents with a four day history of vaginal itching and discharge. She reports completing a course of antibiotics for a sinus infection 3 weeks ago, as she frequently does given her chronic sinus issues, and that these vaginal symptoms typically follow a course of antibiotics; normally, her provider prescribes an oral antifungal to clear the yeast infection that follows but did not this most recent time. Patient describes persistent itchiness and a white discharge from the vagina. She endorses increased urinary frequency but denies dysuria or any fevers.   Patient denies history of DM.   OB History    Gravida Para Term Preterm AB TAB SAB Ectopic Multiple Living   1    1   1         Past Medical History  Diagnosis Date  . Asthma   . Environmental allergies   . Fibroids   . Shortness of breath   . GERD (gastroesophageal reflux disease)   . Anemia   . Fibroid, uterine   . Menorrhagia   . Blood transfusion without reported diagnosis     Past Surgical History  Procedure Laterality Date  . Ectopic pregnancy surgery    . Myomectomy    . Cholecystectomy      06/29/2011  . Cholecystectomy  06/29/2011    Procedure: LAPAROSCOPIC CHOLECYSTECTOMY WITH INTRAOPERATIVE CHOLANGIOGRAM;  Surgeon: Merrie Roof, MD;  Location: [facility];  Service: General;  Laterality: N/A;  . Ercp  06/30/2011    Procedure: ENDOSCOPIC RETROGRADE CHOLANGIOPANCREATOGRAPHY (ERCP);  Surgeon: Jeryl Columbia, MD;  Location: [facility] ENDOSCOPY;  Service: Endoscopy;  Laterality: N/A;  probable sphincterotomy  . Ercp  07/03/2011    Procedure: ENDOSCOPIC RETROGRADE CHOLANGIOPANCREATOGRAPHY (ERCP);  Surgeon: Landry Dyke, MD;  Location: [facility];  Service: Endoscopy;  Laterality: N/A;  PROPOFOL  . Myomectomy N/A 11/14/2012    Procedure: MYOMECTOMY;  Surgeon: Osborne Oman, MD;  Location: [facility];  Service: Gynecology;  Laterality: N/A;  . Nasal polyp excision  2016    Family History  Problem Relation Age of Onset  . Anesthesia problems Neg Hx   . Hypotension Neg Hx   . Malignant hyperthermia Neg Hx   . Pseudochol deficiency Neg Hx   . Hypertension Mother   . Diabetes Mother   . Hypertension Father   . Hypertension Brother     Social History  Substance Use Topics  . Smoking status: Never Smoker   . Smokeless tobacco: Never Used  . Alcohol Use: No    Allergies:  Allergies  Allergen Reactions  . Bee Venom Anaphylaxis    Patient has an epi pen, also has not been stung before but was informed she is allergic  . Prednisone Hives    Patient has been rechallenged with prednisone and experienced the same symptoms of hives and face swelling.  She can tolerate methylprednisolone IV and PO.    Prescriptions prior to admission  Medication Sig Dispense Refill Last Dose  . albuterol (PROVENTIL HFA;VENTOLIN HFA) 108 (90 Base) MCG/ACT inhaler Inhale 2 puffs into the lungs every 6 (six) hours as needed for wheezing or shortness of breath.   Past Week  at Unknown time  . budesonide-formoterol (SYMBICORT) 160-4.5 MCG/ACT inhaler Inhale 2 puffs into the lungs 2 (two) times daily as needed (for shortness of breath).    11/03/2015 at Unknown time  . cetirizine (ZYRTEC) 10 MG tablet Take 10 mg by mouth daily.   Past Week at Unknown time  . fluticasone (VERAMYST) 27.5 MCG/SPRAY nasal spray Place 2 sprays into the nose 2 (two) times daily.   11/03/2015 at Unknown time  . guaiFENesin (MUCINEX) 600 MG 12 hr tablet Take 600 mg by mouth 2 (two) times daily as needed for cough or to loosen phlegm.    Past Week at Unknown time  . ibuprofen (ADVIL,MOTRIN) 200 MG tablet Take 200 mg by mouth every 6 (six) hours as needed for headache.   Past Week at  Unknown time  . ipratropium-albuterol (DUONEB) 0.5-2.5 (3) MG/3ML SOLN Take 3 mLs by nebulization every 6 (six) hours as needed. (Patient taking differently: Take 3 mLs by nebulization every 6 (six) hours as needed (for shortness of breath). ) 360 mL 2 11/04/2015 at Unknown time  . montelukast (SINGULAIR) 10 MG tablet Take 1 tablet (10 mg total) by mouth at bedtime. 30 tablet 11 11/03/2015 at Unknown time  . Prenatal Vit-Fe Fumarate-FA (PRENATAL MULTIVITAMIN) TABS tablet Take 1 tablet by mouth daily at 12 noon.   Past Week at Unknown time  . ranitidine (ZANTAC) 150 MG tablet Take 150 mg by mouth daily.   11/03/2015 at Unknown time   Results for orders placed or performed during the hospital encounter of 11/04/15 (from the past 48 hour(s))  Urinalysis, Routine w reflex microscopic (not at [facility])     Status: Abnormal   Collection Time: 11/04/15 12:15 PM  Result Value Ref Range   Color, Urine YELLOW YELLOW   APPearance HAZY (A) CLEAR   Specific Gravity, Urine >1.030 (H) 1.005 - 1.030   pH 5.5 5.0 - 8.0   Glucose, UA NEGATIVE NEGATIVE mg/dL   Hgb urine dipstick TRACE (A) NEGATIVE   Bilirubin Urine NEGATIVE NEGATIVE   Ketones, ur NEGATIVE NEGATIVE mg/dL   Protein, ur NEGATIVE NEGATIVE mg/dL   Nitrite NEGATIVE NEGATIVE   Leukocytes, UA SMALL (A) NEGATIVE  Urine microscopic-add on     Status: Abnormal   Collection Time: 11/04/15 12:15 PM  Result Value Ref Range   Squamous Epithelial / LPF 0-5 (A) NONE SEEN   WBC, UA 0-5 0 - 5 WBC/hpf   RBC / HPF 0-5 0 - 5 RBC/hpf   Bacteria, UA RARE (A) NONE SEEN   Urine-Other MUCOUS PRESENT   Pregnancy, urine POC     Status: None   Collection Time: 11/04/15 12:24 PM  Result Value Ref Range   Preg Test, Ur NEGATIVE NEGATIVE    Comment:        THE SENSITIVITY OF THIS METHODOLOGY IS >24 mIU/mL   Wet prep, genital     Status: Abnormal   Collection Time: 11/04/15  1:30 PM  Result Value Ref Range   Yeast Wet Prep HPF POC NONE SEEN NONE SEEN   Trich, Wet Prep  NONE SEEN NONE SEEN   Clue Cells Wet Prep HPF POC NONE SEEN NONE SEEN   WBC, Wet Prep HPF POC FEW (A) NONE SEEN    Comment: MANY BACTERIA SEEN   Sperm NONE SEEN     Review of Systems  Constitutional: Negative for fever and chills.  Gastrointestinal: Negative for nausea, vomiting, abdominal pain and diarrhea.  Genitourinary: Positive for frequency. Negative for dysuria, urgency,  hematuria and flank pain.  Skin: Positive for itching.     Physical Exam   Blood pressure 135/94, pulse 86, temperature 98.3 F (36.8 C), temperature source Oral, resp. rate 18, height 5\' 2"  (1.575 m), weight 277 lb (125.646 kg), last menstrual period 10/08/2015.  Physical Exam  Constitutional: She is oriented to person, place, and time.  Cardiovascular: Normal rate, regular rhythm, normal heart sounds and intact distal pulses.   Respiratory: Effort normal and breath sounds normal. She has no wheezes. She has no rales. She exhibits no tenderness.  GI: She exhibits no distension. There is no tenderness. There is no rebound.  Genitourinary:  Wet prep and GC collected without speculum.   Neurological: She is alert and oriented to person, place, and time.  Skin: Skin is warm and dry. No rash noted. No erythema. No pallor.    MAU Course  Procedures  None  MDM Differential points most likely to superimposed candidal infection following antibiotic treatment, given her recurrent history of identical symptoms in the same circumstances. However, the symptoms may also point to a urinary tract infection or bacterial vaginosis.  Wet prep negative for yeast.   Assessment and Plan    A:  [name], a 40 year old with a history of asthma and allergies, presents with a likely yeast infection following a course of antibiotics for a sinus infection. We will plan to treat this with oral fluconazole, but will also get a wet mount of the discharge to confirm the diagnosis, as well as a urinalysis to determine whether or not  she has a urinary tract infection.   Presumed yeast vaginitis vs. vaginal dryness.    P:  Discharge home in stable condition  Replens over the counter if yeast treatment is not effective  Return to MAU for emergencies  RX: Diflucan X 2 doses, repeat in 3 days Keep your appointment with Dr. Hinda Glatter I Rasch, NP 11/04/2015 4:33 PM

## 2015-11-18 ENCOUNTER — Ambulatory Visit (INDEPENDENT_AMBULATORY_CARE_PROVIDER_SITE_OTHER): Payer: 59 | Admitting: Obstetrics & Gynecology

## 2015-11-18 ENCOUNTER — Encounter: Payer: Self-pay | Admitting: Obstetrics & Gynecology

## 2015-11-18 VITALS — BP 134/91 | HR 91 | Ht 62.0 in | Wt 277.0 lb

## 2015-11-18 DIAGNOSIS — Z1151 Encounter for screening for human papillomavirus (HPV): Secondary | ICD-10-CM

## 2015-11-18 DIAGNOSIS — B379 Candidiasis, unspecified: Secondary | ICD-10-CM

## 2015-11-18 DIAGNOSIS — B3731 Acute candidiasis of vulva and vagina: Secondary | ICD-10-CM

## 2015-11-18 DIAGNOSIS — T3695XA Adverse effect of unspecified systemic antibiotic, initial encounter: Secondary | ICD-10-CM

## 2015-11-18 DIAGNOSIS — Z124 Encounter for screening for malignant neoplasm of cervix: Secondary | ICD-10-CM | POA: Diagnosis not present

## 2015-11-18 DIAGNOSIS — Z01419 Encounter for gynecological examination (general) (routine) without abnormal findings: Secondary | ICD-10-CM | POA: Diagnosis not present

## 2015-11-18 DIAGNOSIS — B373 Candidiasis of vulva and vagina: Secondary | ICD-10-CM

## 2015-11-18 DIAGNOSIS — N979 Female infertility, unspecified: Secondary | ICD-10-CM

## 2015-11-18 DIAGNOSIS — Z1231 Encounter for screening mammogram for malignant neoplasm of breast: Secondary | ICD-10-CM

## 2015-11-18 MED ORDER — FLUCONAZOLE 150 MG PO TABS: 150.0000 mg | ORAL_TABLET | Freq: Every day | ORAL | Status: DC

## 2015-11-18 NOTE — Progress Notes (Signed)
GYNECOLOGY CLINIC ANNUAL PREVENTATIVE CARE ENCOUNTER NOTE  Subjective:   [name] is a 40 y.o. [id number] female here for a routine annual gynecologic exam.  Current complaints: still trying to conceive.  Denies abnormal vaginal bleeding, discharge, pelvic pain, problems with intercourse or other gynecologic concerns.  Desires Diflucan prescription for antibiotic-related yeast infections, none currently.   Gynecologic History Patient's last menstrual period was 11/08/2015. Contraception: none Last Pap: 08/18/2012. Results were: normal pap smear and negative HRHPV.    Obstetric History OB History  Gravida Para Term Preterm AB SAB TAB Ectopic Multiple Living  1    1   1       # Outcome Date GA Lbr Len/2nd Weight Sex Delivery Anes PTL Lv  1 Ectopic 2011              Past Medical History  Diagnosis Date  . Asthma   . Environmental allergies   . Fibroids   . Shortness of breath   . GERD (gastroesophageal reflux disease)   . Anemia   . Fibroid, uterine   . Menorrhagia   . Blood transfusion without reported diagnosis     Past Surgical History  Procedure Laterality Date  . Ectopic pregnancy surgery    . Myomectomy    . Cholecystectomy      06/29/2011  . Cholecystectomy  06/29/2011    Procedure: LAPAROSCOPIC CHOLECYSTECTOMY WITH INTRAOPERATIVE CHOLANGIOGRAM;  Surgeon: Merrie Roof, MD;  Location: [place];  Service: General;  Laterality: N/A;  . Ercp  06/30/2011    Procedure: ENDOSCOPIC RETROGRADE CHOLANGIOPANCREATOGRAPHY (ERCP);  Surgeon: Jeryl Columbia, MD;  Location: [facility] ENDOSCOPY;  Service: Endoscopy;  Laterality: N/A;  probable sphincterotomy  . Ercp  07/03/2011    Procedure: ENDOSCOPIC RETROGRADE CHOLANGIOPANCREATOGRAPHY (ERCP);  Surgeon: Landry Dyke, MD;  Location: [place];  Service: Endoscopy;  Laterality: N/A;  PROPOFOL  . Myomectomy N/A 11/14/2012    Procedure: MYOMECTOMY;  Surgeon: Osborne Oman, MD;  Location: [facility];  Service: Gynecology;  Laterality: N/A;    . Nasal polyp excision  2016    Current Outpatient Prescriptions on File Prior to Visit  Medication Sig Dispense Refill  . albuterol (PROVENTIL HFA;VENTOLIN HFA) 108 (90 Base) MCG/ACT inhaler Inhale 2 puffs into the lungs every 6 (six) hours as needed for wheezing or shortness of breath.    . budesonide-formoterol (SYMBICORT) 160-4.5 MCG/ACT inhaler Inhale 2 puffs into the lungs 2 (two) times daily as needed (for shortness of breath).     . cetirizine (ZYRTEC) 10 MG tablet Take 10 mg by mouth daily.    . fluticasone (VERAMYST) 27.5 MCG/SPRAY nasal spray Place 2 sprays into the nose 2 (two) times daily.    Marland Kitchen guaiFENesin (MUCINEX) 600 MG 12 hr tablet Take 600 mg by mouth 2 (two) times daily as needed for cough or to loosen phlegm.     Marland Kitchen ibuprofen (ADVIL,MOTRIN) 200 MG tablet Take 200 mg by mouth every 6 (six) hours as needed for headache.    . ipratropium-albuterol (DUONEB) 0.5-2.5 (3) MG/3ML SOLN Take 3 mLs by nebulization every 6 (six) hours as needed. (Patient taking differently: Take 3 mLs by nebulization every 6 (six) hours as needed (for shortness of breath). ) 360 mL 2  . montelukast (SINGULAIR) 10 MG tablet Take 1 tablet (10 mg total) by mouth at bedtime. 30 tablet 11  . Prenatal Vit-Fe Fumarate-FA (PRENATAL MULTIVITAMIN) TABS tablet Take 1 tablet by mouth daily at 12 noon.    Marland Kitchen  ranitidine (ZANTAC) 150 MG tablet Take 150 mg by mouth daily.     No current facility-administered medications on file prior to visit.    Allergies  Allergen Reactions  . Bee Venom Anaphylaxis    Patient has an epi pen, also has not been stung before but was informed she is allergic  . Prednisone Hives    Patient has been rechallenged with prednisone and experienced the same symptoms of hives and face swelling.  She can tolerate methylprednisolone IV and PO.    Social History   Social History  . Marital Status: Married    Spouse Name: N/A  . Number of Children: N/A  . Years of Education: N/A    Occupational History  . Not on file.   Social History Main Topics  . Smoking status: Never Smoker   . Smokeless tobacco: Never Used  . Alcohol Use: No  . Drug Use: No  . Sexual Activity: Not Currently    Birth Control/ Protection: None   Other Topics Concern  . Not on file   Social History Narrative    Family History  Problem Relation Age of Onset  . Anesthesia problems Neg Hx   . Hypotension Neg Hx   . Malignant hyperthermia Neg Hx   . Pseudochol deficiency Neg Hx   . Hypertension Mother   . Diabetes Mother   . Hypertension Father   . Hypertension Brother     The following portions of the patient's history were reviewed and updated as appropriate: allergies, current medications, past family history, past medical history, past social history, past surgical history and problem list.  Review of Systems A comprehensive review of systems was negative.   Objective:  BP 134/91 mmHg  Pulse 91  Ht 5\' 2"  (1.575 m)  Wt 277 lb (125.646 kg)  BMI 50.65 kg/m2  LMP 11/08/2015 CONSTITUTIONAL: Well-developed, well-nourished female in no acute distress.  HENT:  Normocephalic, atraumatic, External right and left ear normal. Oropharynx is clear and moist EYES: Conjunctivae and EOM are normal. Pupils are equal, round, and reactive to light. No scleral icterus.  NECK: Normal range of motion, supple, no masses.  Normal thyroid.  SKIN: Skin is warm and dry. No rash noted. Not diaphoretic. No erythema. No pallor. NEUROLOGIC: Alert and oriented to person, place, and time. Normal reflexes, muscle tone coordination. No cranial nerve deficit noted. PSYCHIATRIC: Normal mood and affect. Normal behavior. Normal judgment and thought content. CARDIOVASCULAR: Normal heart rate noted, regular rhythm RESPIRATORY: Clear to auscultation bilaterally. Effort and breath sounds normal, no problems with respiration noted. BREASTS: Symmetric in size. No masses, skin changes, nipple drainage, or  lymphadenopathy. ABDOMEN: Soft, obese, normal bowel sounds, no distention appreciated.  No tenderness, rebound or guarding.  PELVIC: Normal appearing external genitalia; normal appearing vaginal mucosa and cervix.  Normal appearing discharge.  Pap smear obtained.  Unable to palpate uterus or adnexa secondary to habitus.  MUSCULOSKELETAL: Normal range of motion. No tenderness.  No cyanosis, clubbing, or edema.  2+ distal pulses.    Assessment:  Annual gynecologic examination with pap smear   Plan:  Will follow up results of pap smear and manage accordingly. Mammogram scheduled Diflucan prescribed Routine preventative health maintenance measures emphasized. Please refer to After Visit Summary for other counseling recommendations.    Verita Schneiders, MD, [place] Attending Obstetrician & Gynecologist, [place] for [facility]

## 2015-11-19 LAB — CYTOLOGY - PAP

## 2015-11-21 ENCOUNTER — Encounter: Payer: Self-pay | Admitting: *Deleted

## 2015-12-03 ENCOUNTER — Ambulatory Visit: Payer: 59

## 2015-12-10 ENCOUNTER — Ambulatory Visit
Admission: RE | Admit: 2015-12-10 | Discharge: 2015-12-10 | Disposition: A | Discharge status: Home / Self Care | Payer: 59 | Source: Ambulatory Visit | Attending: Obstetrics & Gynecology | Admitting: Obstetrics & Gynecology

## 2015-12-10 DIAGNOSIS — Z1231 Encounter for screening mammogram for malignant neoplasm of breast: Secondary | ICD-10-CM

## 2016-04-21 ENCOUNTER — Emergency Department (HOSPITAL_COMMUNITY): Payer: Medicare HMO

## 2016-04-21 ENCOUNTER — Inpatient Hospital Stay (HOSPITAL_COMMUNITY)
Admission: EM | Admit: 2016-04-21 | Discharge: 2016-04-25 | DRG: 202 | Disposition: A | Discharge status: Home / Self Care | Payer: Medicare HMO | Attending: Family Medicine | Admitting: Family Medicine

## 2016-04-21 ENCOUNTER — Encounter (HOSPITAL_COMMUNITY): Payer: Self-pay | Admitting: Emergency Medicine

## 2016-04-21 DIAGNOSIS — D259 Leiomyoma of uterus, unspecified: Secondary | ICD-10-CM | POA: Diagnosis present

## 2016-04-21 DIAGNOSIS — R000 Tachycardia, unspecified: Secondary | ICD-10-CM | POA: Diagnosis present

## 2016-04-21 DIAGNOSIS — Z79899 Other long term (current) drug therapy: Secondary | ICD-10-CM

## 2016-04-21 DIAGNOSIS — R490 Dysphonia: Secondary | ICD-10-CM | POA: Diagnosis present

## 2016-04-21 DIAGNOSIS — N920 Excessive and frequent menstruation with regular cycle: Secondary | ICD-10-CM | POA: Diagnosis present

## 2016-04-21 DIAGNOSIS — Z833 Family history of diabetes mellitus: Secondary | ICD-10-CM

## 2016-04-21 DIAGNOSIS — Z7951 Long term (current) use of inhaled steroids: Secondary | ICD-10-CM

## 2016-04-21 DIAGNOSIS — D500 Iron deficiency anemia secondary to blood loss (chronic): Secondary | ICD-10-CM | POA: Diagnosis present

## 2016-04-21 DIAGNOSIS — K219 Gastro-esophageal reflux disease without esophagitis: Secondary | ICD-10-CM | POA: Diagnosis present

## 2016-04-21 DIAGNOSIS — Z888 Allergy status to other drugs, medicaments and biological substances status: Secondary | ICD-10-CM

## 2016-04-21 DIAGNOSIS — Z6841 Body Mass Index (BMI) 40.0 and over, adult: Secondary | ICD-10-CM

## 2016-04-21 DIAGNOSIS — B349 Viral infection, unspecified: Secondary | ICD-10-CM | POA: Diagnosis present

## 2016-04-21 DIAGNOSIS — Z8249 Family history of ischemic heart disease and other diseases of the circulatory system: Secondary | ICD-10-CM

## 2016-04-21 DIAGNOSIS — J45901 Unspecified asthma with (acute) exacerbation: Principal | ICD-10-CM | POA: Diagnosis present

## 2016-04-21 DIAGNOSIS — R0602 Shortness of breath: Secondary | ICD-10-CM | POA: Diagnosis not present

## 2016-04-21 DIAGNOSIS — Z91030 Bee allergy status: Secondary | ICD-10-CM

## 2016-04-21 DIAGNOSIS — J45909 Unspecified asthma, uncomplicated: Secondary | ICD-10-CM | POA: Diagnosis present

## 2016-04-21 LAB — CBC WITH DIFFERENTIAL/PLATELET
Basophils Absolute: 0.1 10*3/uL (ref 0.0–0.1)
Basophils Relative: 1 %
EOS ABS: 0.8 10*3/uL — AB (ref 0.0–0.7)
Eosinophils Relative: 6 %
HCT: 28.5 % — ABNORMAL LOW (ref 36.0–46.0)
Hemoglobin: 7.7 g/dL — ABNORMAL LOW (ref 12.0–15.0)
Lymphocytes Relative: 29 %
Lymphs Abs: 3.7 10*3/uL (ref 0.7–4.0)
MCH: 14.7 pg — ABNORMAL LOW (ref 26.0–34.0)
MCHC: 27 g/dL — ABNORMAL LOW (ref 30.0–36.0)
MCV: 54.3 fL — ABNORMAL LOW (ref 78.0–100.0)
MONO ABS: 0.9 10*3/uL (ref 0.1–1.0)
Monocytes Relative: 7 %
NEUTROS PCT: 57 %
Neutro Abs: 7.4 10*3/uL (ref 1.7–7.7)
PLATELETS: 420 10*3/uL — AB (ref 150–400)
RBC: 5.25 MIL/uL — AB (ref 3.87–5.11)
RDW: 21.7 % — ABNORMAL HIGH (ref 11.5–15.5)
WBC: 12.9 10*3/uL — AB (ref 4.0–10.5)

## 2016-04-21 LAB — BASIC METABOLIC PANEL
ANION GAP: 6 (ref 5–15)
BUN: 10 mg/dL (ref 6–20)
CO2: 25 mmol/L (ref 22–32)
Calcium: 8.8 mg/dL — ABNORMAL LOW (ref 8.9–10.3)
Chloride: 105 mmol/L (ref 101–111)
Creatinine, Ser: 0.74 mg/dL (ref 0.44–1.00)
GFR calc Af Amer: >60 mL/min (ref >60)
Glucose, Bld: 93 mg/dL (ref 65–99)
POTASSIUM: 3.5 mmol/L (ref 3.5–5.1)
SODIUM: 136 mmol/L (ref 135–145)

## 2016-04-21 LAB — I-STAT TROPONIN, ED: Troponin i, poc: 0.01 ng/mL (ref 0.00–0.08)

## 2016-04-21 LAB — BLOOD GAS, ARTERIAL
Acid-base deficit: 2.1 mmol/L — ABNORMAL HIGH (ref 0.0–2.0)
BICARBONATE: 21.9 mmol/L (ref 20.0–28.0)
Drawn by: 11249
FIO2: 0.21
O2 SAT: 93.4 %
PATIENT TEMPERATURE: 98.6
pCO2 arterial: 36.5 mmHg (ref 32.0–48.0)
pH, Arterial: 7.396 (ref 7.350–7.450)
pO2, Arterial: 74.3 mmHg — ABNORMAL LOW (ref 83.0–108.0)

## 2016-04-21 MED ORDER — ALBUTEROL SULFATE (2.5 MG/3ML) 0.083% IN NEBU
5.0000 mg | INHALATION_SOLUTION | Freq: Once | RESPIRATORY_TRACT | Status: AC
  Administered 2016-04-21: 5 mg via RESPIRATORY_TRACT
  Filled 2016-04-21: qty 6

## 2016-04-21 MED ORDER — METHYLPREDNISOLONE SODIUM SUCC 125 MG IJ SOLR
125.0000 mg | Freq: Once | INTRAMUSCULAR | Status: AC
  Administered 2016-04-21: 125 mg via INTRAVENOUS
  Filled 2016-04-21: qty 2

## 2016-04-21 MED ORDER — SODIUM CHLORIDE 0.9 % IV SOLN: Freq: Once | INTRAVENOUS | Status: DC

## 2016-04-21 MED ORDER — ALBUTEROL (5 MG/ML) CONTINUOUS INHALATION SOLN
10.0000 mg/h | INHALATION_SOLUTION | Freq: Once | RESPIRATORY_TRACT | Status: AC
  Administered 2016-04-21: 10 mg/h via RESPIRATORY_TRACT
  Filled 2016-04-21: qty 20

## 2016-04-21 MED ORDER — HYDROCOD POLST-CPM POLST ER 10-8 MG/5ML PO SUER
5.0000 mL | Freq: Once | ORAL | Status: AC
  Administered 2016-04-21: 5 mL via ORAL
  Filled 2016-04-21: qty 5

## 2016-04-21 MED ORDER — MAGNESIUM SULFATE 2 GM/50ML IV SOLN
2.0000 g | Freq: Once | INTRAVENOUS | Status: AC
  Administered 2016-04-21: 2 g via INTRAVENOUS
  Filled 2016-04-21: qty 50

## 2016-04-21 MED ORDER — SODIUM CHLORIDE 0.9 % IV BOLUS (SEPSIS)
1000.0000 mL | Freq: Once | INTRAVENOUS | Status: AC
  Administered 2016-04-21: 1000 mL via INTRAVENOUS

## 2016-04-21 MED ORDER — ACETAMINOPHEN 325 MG PO TABS
650.0000 mg | ORAL_TABLET | Freq: Once | ORAL | Status: AC
  Administered 2016-04-21: 650 mg via ORAL
  Filled 2016-04-21: qty 2

## 2016-04-21 NOTE — ED Provider Notes (Signed)
[place] DEPT Provider Note   CSN: [id number] Arrival date & time: 04/21/16  1501     History   Chief Complaint Chief Complaint  Patient presents with  . Asthma    HPI [name] is a 40 y.o. female.  HPI [name] is a 40 y.o. female with history of asthma, anemia, acid reflux, presents to emergency department complaining of shortness of breath and wheezing. Patient states her symptoms started 4 days ago. She reports nonproductive cough. States symptoms are similar to asthma exacerbation. She has been using nebulized treatments at home with albuterol and Atrovent with no improvement. She has had 3 treatments today, and received one more up on arrival to emergency department. She has not tried any other medications. She states that she is having sore throat from all the coughing. Denies any nasal congestion. No fever or chills.  Past Medical History:  Diagnosis Date  . Anemia   . Asthma   . Blood transfusion without reported diagnosis   . Environmental allergies   . Fibroid, uterine   . Fibroids   . GERD (gastroesophageal reflux disease)   . Menorrhagia   . Shortness of breath     Patient Active Problem List   Diagnosis Date Noted  . Acute sinusitis, unspecified 03/26/2014  . Essential hypertension, benign 12/08/2013  . 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 Asthma exacerbation 09/26/2013  . 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 Fibroids 08/18/2012  . Menorrhagia 08/18/2012  . Extrinsic asthma  08/15/2011  . Cholecystitis 06/29/2011    Past Surgical History:  Procedure Laterality Date  . CHOLECYSTECTOMY     06/29/2011  . CHOLECYSTECTOMY  06/29/2011   Procedure: LAPAROSCOPIC CHOLECYSTECTOMY WITH INTRAOPERATIVE CHOLANGIOGRAM;  Surgeon: Merrie Roof, MD;  Location: [place];  Service: General;  Laterality: N/A;  . ECTOPIC PREGNANCY SURGERY    . ERCP  06/30/2011   Procedure: ENDOSCOPIC RETROGRADE CHOLANGIOPANCREATOGRAPHY (ERCP);   Surgeon: Jeryl Columbia, MD;  Location: [facility] ENDOSCOPY;  Service: Endoscopy;  Laterality: N/A;  probable sphincterotomy  . ERCP  07/03/2011   Procedure: ENDOSCOPIC RETROGRADE CHOLANGIOPANCREATOGRAPHY (ERCP);  Surgeon: Landry Dyke, MD;  Location: [place];  Service: Endoscopy;  Laterality: N/A;  PROPOFOL  . MYOMECTOMY    . MYOMECTOMY N/A 11/14/2012   Procedure: MYOMECTOMY;  Surgeon: Osborne Oman, MD;  Location: [facility];  Service: Gynecology;  Laterality: N/A;  . NASAL POLYP EXCISION  2016    OB History    Gravida Para Term Preterm AB Living   1       1     SAB TAB Ectopic Multiple Live Births       1           Home Medications    Prior to Admission medications   Medication Sig Start Date End Date Taking? Authorizing Provider  albuterol (PROVENTIL HFA;VENTOLIN HFA) 108 (90 Base) MCG/ACT inhaler Inhale 2 puffs into the lungs every 6 (six) hours as needed for wheezing or shortness of breath.    Historical Provider, MD  budesonide-formoterol (SYMBICORT) 160-4.5 MCG/ACT inhaler Inhale 2 puffs into the lungs 2 (two) times daily as needed (for shortness of breath).     Historical Provider, MD  cetirizine (ZYRTEC) 10 MG tablet Take 10 mg by mouth daily.    Historical Provider, MD  fluconazole (DIFLUCAN) 150 MG tablet Take 1 tablet (150 mg total) by mouth daily. Take one tablet now and repeat in 3 days. 11/18/15   Laray Anger  A Anyanwu, MD  fluticasone (VERAMYST) 27.5 MCG/SPRAY nasal spray Place 2 sprays into the nose 2 (two) times daily.    Historical Provider, MD  guaiFENesin (MUCINEX) 600 MG 12 hr tablet Take 600 mg by mouth 2 (two) times daily as needed for cough or to loosen phlegm.     Historical Provider, MD  ibuprofen (ADVIL,MOTRIN) 200 MG tablet Take 200 mg by mouth every 6 (six) hours as needed for headache.    Historical Provider, MD  ipratropium-albuterol (DUONEB) 0.5-2.5 (3) MG/3ML SOLN Take 3 mLs by nebulization every 6 (six) hours as needed. Patient taking differently: Take 3 mLs by  nebulization every 6 (six) hours as needed (for shortness of breath).  12/07/13   Hosie Poisson, MD  montelukast (SINGULAIR) 10 MG tablet Take 1 tablet (10 mg total) by mouth at bedtime. 02/26/14   Tanda Rockers, MD  Prenatal Vit-Fe Fumarate-FA (PRENATAL MULTIVITAMIN) TABS tablet Take 1 tablet by mouth daily at 12 noon.    Historical Provider, MD  ranitidine (ZANTAC) 150 MG tablet Take 150 mg by mouth daily.    Historical Provider, MD    Family History Family History  Problem Relation Age of Onset  . Hypertension Mother   . Diabetes Mother   . Hypertension Father   . Hypertension Brother   . Anesthesia problems Neg Hx   . Hypotension Neg Hx   . Malignant hyperthermia Neg Hx   . Pseudochol deficiency Neg Hx     Social History Social History  Substance Use Topics  . Smoking status: Never Smoker  . Smokeless tobacco: Never Used  . Alcohol use No     Allergies   Bee venom and Prednisone   Review of Systems Review of Systems  Constitutional: Negative for chills and fever.  HENT: Positive for sore throat. Negative for congestion.   Respiratory: Positive for cough, chest tightness and shortness of breath.   Cardiovascular: Negative for chest pain, palpitations and leg swelling.  Gastrointestinal: Negative for abdominal pain, diarrhea, nausea and vomiting.  Genitourinary: Negative for dysuria, flank pain, pelvic pain, vaginal bleeding, vaginal discharge and vaginal pain.  Musculoskeletal: Negative for arthralgias, myalgias, neck pain and neck stiffness.  Skin: Negative for rash.  Neurological: Negative for dizziness, weakness and headaches.  All other systems reviewed and are negative.    Physical Exam Updated Vital Signs BP (!) 179/106 (BP Location: Left Arm)   Pulse 100   Temp 98.7 F (37.1 C) (Oral)   Resp 18   Ht 5\' 2"  (1.575 m)   Wt 128.4 kg   LMP 03/28/2016   SpO2 100%   BMI 51.76 kg/m   Physical Exam  Constitutional: She appears well-developed and  well-nourished. No distress.  HENT:  Head: Normocephalic.  Nose: Nose normal.  Mouth/Throat: Oropharynx is clear and moist.  Eyes: Conjunctivae are normal.  Neck: Neck supple.  Cardiovascular: Normal rate, regular rhythm and normal heart sounds.   Pulmonary/Chest: No respiratory distress. She has wheezes. She has no rales.  Tachypnec, decreased breath sounds bilaterally. Inspiratory and expiratory wheezes bilaterally  Abdominal: Soft. Bowel sounds are normal. She exhibits no distension. There is no tenderness. There is no rebound.  Musculoskeletal: She exhibits no edema.  Neurological: She is alert.  Skin: Skin is warm and dry.  Psychiatric: She has a normal mood and affect. Her behavior is normal.  Nursing note and vitals reviewed.    ED Treatments / Results  Labs (all labs ordered are listed, but only abnormal results are displayed)  Labs Reviewed - No data to display  EKG  EKG Interpretation None       Radiology Dg Chest 2 View  Result Date: 04/21/2016 CLINICAL DATA:  Asthma for 2 weeks EXAM: CHEST  2 VIEW COMPARISON:  12/04/2013 FINDINGS: Cardiomediastinal silhouette is stable. No acute infiltrate or pleural effusion. No pulmonary edema. Mild degenerative changes lower thoracic spine. IMPRESSION: No active cardiopulmonary disease. Electronically Signed   By: Lahoma Crocker M.D.   On: 04/21/2016 16:33    Procedures Procedures (including critical care time)  Medications Ordered in ED Medications  albuterol (PROVENTIL,VENTOLIN) solution continuous neb (not administered)  methylPREDNISolone sodium succinate (SOLU-MEDROL) 125 mg/2 mL injection 125 mg (not administered)  magnesium sulfate IVPB 2 g 50 mL (not administered)  albuterol (PROVENTIL) (2.5 MG/3ML) 0.083% nebulizer solution 5 mg (5 mg Nebulization Given 04/21/16 1600)     Initial Impression / Assessment and Plan / ED Course  I have reviewed the triage vital signs and the nursing notes.  Pertinent labs & imaging  results that were available during my care of the patient were reviewed by me and considered in my medical decision making (see chart for details).  Clinical Course    6:44 PM Patient seen and examined. Patient emergency department with asthma exacerbation. Her vital signs are normal, she does have inspiratory and expiratory wheezes, tachypnea. She already received one breathing treatment and 3 at home. Will given hour-long treatment, will try Solu-Medrol and magnesium. Will monitor. CXR negative.   8:45 PM Pt finished her hour long treatment. She does not feel better. She has increased cough. She is not tachypnec. She is in NAD. She is coughing. I have ordered her tussionex for cough. Pt not comfortable going home. Will call for admission.  Pt is anemic. She has hx of the same due to heavy menses. Asymptomatic at this time.   9:14 PM Spoke with [facility]. Asked for ABG. Will order.   Final Clinical Impressions(s) / ED Diagnoses   Final diagnoses:  Exacerbation of asthma, unspecified asthma severity, unspecified whether persistent    New Prescriptions New Prescriptions   No medications on file     Jeannett Senior, PA-C 04/23/16 0056    Margette Fast, MD 04/23/16 [phone]

## 2016-04-21 NOTE — H&P (Signed)
History and Physical    [name] [id number] DOB: Apr 07, 1976 DOA: 04/21/2016  PCP: Philis Fendt, MD  Patient coming from:  home  Chief Complaint:   Sob, wheezing, cough  HPI: [name] is a 40 y.o. female with medical history significant of asthma comes in with several days of coughing and loosing her voice along with nasal congestion, wheezing and sob.  Pt has known h/o asthma and her asthma has been acting up.  She has been using her breathing treatments a lot at home and this has not helped.  She has been having chest tightness with all of her coughing, but mainly with her coughing.  Denies any swelling in her legs.  No fevers.  Pt referred for admission for asthma exacerbation.  Review of Systems: As per HPI otherwise 10 point review of systems negative.   Past Medical History:  Diagnosis Date  . Anemia   . Asthma   . Blood transfusion without reported diagnosis   . Environmental allergies   . Fibroid, uterine   . Fibroids   . GERD (gastroesophageal reflux disease)   . Menorrhagia   . Shortness of breath     Past Surgical History:  Procedure Laterality Date  . CHOLECYSTECTOMY     06/29/2011  . CHOLECYSTECTOMY  06/29/2011   Procedure: LAPAROSCOPIC CHOLECYSTECTOMY WITH INTRAOPERATIVE CHOLANGIOGRAM;  Surgeon: Merrie Roof, MD;  Location: [place];  Service: General;  Laterality: N/A;  . ECTOPIC PREGNANCY SURGERY    . ERCP  06/30/2011   Procedure: ENDOSCOPIC RETROGRADE CHOLANGIOPANCREATOGRAPHY (ERCP);  Surgeon: Jeryl Columbia, MD;  Location: [facility] ENDOSCOPY;  Service: Endoscopy;  Laterality: N/A;  probable sphincterotomy  . ERCP  07/03/2011   Procedure: ENDOSCOPIC RETROGRADE CHOLANGIOPANCREATOGRAPHY (ERCP);  Surgeon: Landry Dyke, MD;  Location: [place];  Service: Endoscopy;  Laterality: N/A;  PROPOFOL  . MYOMECTOMY    . MYOMECTOMY N/A 11/14/2012   Procedure: MYOMECTOMY;  Surgeon: Osborne Oman, MD;  Location: [place] [place];  Service: Gynecology;  Laterality: N/A;  .  NASAL POLYP EXCISION  2016     reports that she has never smoked. She has never used smokeless tobacco. She reports that she does not drink alcohol or use drugs.  Allergies  Allergen Reactions  . Bee Venom Anaphylaxis    Patient has an epi pen, also has not been stung before but was informed she is allergic  . Prednisone Hives    Patient has been rechallenged with prednisone and experienced the same symptoms of hives and face swelling.  She can tolerate methylprednisolone IV and PO.    Family History  Problem Relation Age of Onset  . Hypertension Mother   . Diabetes Mother   . Hypertension Father   . Hypertension Brother   . Anesthesia problems Neg Hx   . Hypotension Neg Hx   . Malignant hyperthermia Neg Hx   . Pseudochol deficiency Neg Hx     Prior to Admission medications   Medication Sig Start Date End Date Taking? Authorizing Provider  albuterol (PROVENTIL HFA;VENTOLIN HFA) 108 (90 Base) MCG/ACT inhaler Inhale 2 puffs into the lungs every 6 (six) hours as needed for wheezing or shortness of breath.   Yes Historical Provider, MD  budesonide-formoterol (SYMBICORT) 160-4.5 MCG/ACT inhaler Inhale 2 puffs into the lungs 2 (two) times daily as needed (for shortness of breath).    Yes Historical Provider, MD  cetirizine (ZYRTEC) 10 MG tablet Take 10 mg by mouth daily.   Yes Historical Provider,  MD  EPINEPHrine 0.3 mg/0.3 mL IJ SOAJ injection Inject 0.3 mg into the muscle once as needed for anaphylaxis. For up to 2 doses. 03/26/16  Yes Historical Provider, MD  fluticasone (VERAMYST) 27.5 MCG/SPRAY nasal spray Place 2 sprays into the nose 2 (two) times daily.   Yes Historical Provider, MD  guaiFENesin (MUCINEX) 600 MG 12 hr tablet Take 600 mg by mouth 2 (two) times daily as needed for cough or to loosen phlegm.    Yes Historical Provider, MD  ibuprofen (ADVIL,MOTRIN) 200 MG tablet Take 200 mg by mouth every 6 (six) hours as needed for headache.   Yes Historical Provider, MD    ipratropium-albuterol (DUONEB) 0.5-2.5 (3) MG/3ML SOLN Take 3 mLs by nebulization every 6 (six) hours as needed. Patient taking differently: Take 3 mLs by nebulization every 6 (six) hours as needed (for shortness of breath).  12/07/13  Yes Hosie Poisson, MD  montelukast (SINGULAIR) 10 MG tablet Take 1 tablet (10 mg total) by mouth at bedtime. 02/26/14  Yes Tanda Rockers, MD  omeprazole (PRILOSEC) 40 MG capsule Take 40 mg by mouth daily. 02/29/16  Yes Historical Provider, MD  Prenatal Vit-Fe Fumarate-FA (PRENATAL MULTIVITAMIN) TABS tablet Take 1 tablet by mouth daily at 12 noon.   Yes Historical Provider, MD  ranitidine (ZANTAC) 300 MG tablet Take 300 mg by mouth daily. 02/29/16  Yes Historical Provider, MD    Physical Exam: Vitals:   04/21/16 1839 04/21/16 1912 04/21/16 1931 04/21/16 2151  BP: (!) 179/106  151/77 145/83  Pulse: 100  85 107  Resp: 18  17 25   Temp:      TempSrc:      SpO2: 100% 100% 100% 95%  Weight:      Height:        Constitutional: NAD, calm, comfortable Vitals:   04/21/16 1839 04/21/16 1912 04/21/16 1931 04/21/16 2151  BP: (!) 179/106  151/77 145/83  Pulse: 100  85 107  Resp: 18  17 25   Temp:      TempSrc:      SpO2: 100% 100% 100% 95%  Weight:      Height:       Eyes: PERRL, lids and conjunctivae normal ENMT: Mucous membranes are moist. Posterior pharynx clear of any exudate or lesions.Normal dentition.  Neck: normal, supple, no masses, no thyromegaly Respiratory: bilateral wheezing with good air movement, no crackles. Normal respiratory effort. No accessory muscle use.  Cardiovascular: Regular rate and rhythm, no murmurs / rubs / gallops. No extremity edema. 2+ pedal pulses. No carotid bruits.  Abdomen: no tenderness, no masses palpated. No hepatosplenomegaly. Bowel sounds positive.  Musculoskeletal: no clubbing / cyanosis. No joint deformity upper and lower extremities. Good ROM, no contractures. Normal muscle tone.  Skin: no rashes, lesions, ulcers. No  induration Neurologic: CN 2-12 grossly intact. Sensation intact, DTR normal. Strength 5/5 in all 4.  Psychiatric: Normal judgment and insight. Alert and oriented x 3. Normal mood.    Labs on Admission: I have personally reviewed following labs and imaging studies  CBC:  Recent Labs Lab 04/21/16 1908  WBC 12.9*  NEUTROABS 7.4  HGB 7.7*  HCT 28.5*  MCV 54.3*  PLT 0000000*   Basic Metabolic Panel:  Recent Labs Lab 04/21/16 1908  NA 136  K 3.5  CL 105  CO2 25  GLUCOSE 93  BUN 10  CREATININE 0.74  CALCIUM 8.8*   GFR: Estimated Creatinine Clearance: 118.9 mL/min (by C-G formula based on SCr of 0.74 mg/dL).  Urine  analysis:    Component Value Date/Time   COLORURINE YELLOW 11/04/2015 1215   APPEARANCEUR HAZY (A) 11/04/2015 1215   LABSPEC >1.030 (H) 11/04/2015 1215   PHURINE 5.5 11/04/2015 1215   GLUCOSEU NEGATIVE 11/04/2015 1215   HGBUR TRACE (A) 11/04/2015 1215   BILIRUBINUR NEGATIVE 11/04/2015 1215   KETONESUR NEGATIVE 11/04/2015 1215   PROTEINUR NEGATIVE 11/04/2015 1215   UROBILINOGEN 0.2 07/25/2012 1708   NITRITE NEGATIVE 11/04/2015 1215   LEUKOCYTESUR SMALL (A) 11/04/2015 1215      Radiological Exams on Admission: Dg Chest 2 View  Result Date: 04/21/2016 CLINICAL DATA:  Asthma for 2 weeks EXAM: CHEST  2 VIEW COMPARISON:  12/04/2013 FINDINGS: Cardiomediastinal silhouette is stable. No acute infiltrate or pleural effusion. No pulmonary edema. Mild degenerative changes lower thoracic spine. IMPRESSION: No active cardiopulmonary disease. Electronically Signed   By: Lahoma Crocker M.D.   On: 04/21/2016 16:33    Assessment/Plan 40 yo female with acute asthma exacerbation from recent URI  Principal Problem:   Exacerbation of asthma- solumedrol iv.  freq nebs.  Oxygen sats normal.  Vitals all normal except mild tachycardia.  Supportive care for URI.   cxr negative for any infiltrate.  Active Problems:   Anemia due to menorrhagia and fibroids- stable   DVT  prophylaxis:  scds Code Status:   full Family Communication:  none  Disposition Plan:   Per day team Consults called:   none Admission status:   obs   Tally Mattox A MD [facility]  If 7PM-7AM, please contact night-coverage [email or web address] Password [email or web address]  04/21/2016, 10:58 PM

## 2016-04-21 NOTE — ED Triage Notes (Signed)
Patient reports asthma problems x2 weeks. Patient reports using home medications without relief. Wheezing upon auscultation.

## 2016-04-21 NOTE — ED Notes (Signed)
Respiratory notified of nebulization. 

## 2016-04-22 DIAGNOSIS — N920 Excessive and frequent menstruation with regular cycle: Secondary | ICD-10-CM | POA: Diagnosis present

## 2016-04-22 DIAGNOSIS — R0602 Shortness of breath: Secondary | ICD-10-CM | POA: Diagnosis present

## 2016-04-22 DIAGNOSIS — Z833 Family history of diabetes mellitus: Secondary | ICD-10-CM | POA: Diagnosis not present

## 2016-04-22 DIAGNOSIS — Z6841 Body Mass Index (BMI) 40.0 and over, adult: Secondary | ICD-10-CM | POA: Diagnosis not present

## 2016-04-22 DIAGNOSIS — R490 Dysphonia: Secondary | ICD-10-CM | POA: Diagnosis present

## 2016-04-22 DIAGNOSIS — Z91030 Bee allergy status: Secondary | ICD-10-CM | POA: Diagnosis not present

## 2016-04-22 DIAGNOSIS — J45909 Unspecified asthma, uncomplicated: Secondary | ICD-10-CM | POA: Diagnosis present

## 2016-04-22 DIAGNOSIS — D259 Leiomyoma of uterus, unspecified: Secondary | ICD-10-CM | POA: Diagnosis present

## 2016-04-22 DIAGNOSIS — J4551 Severe persistent asthma with (acute) exacerbation: Secondary | ICD-10-CM

## 2016-04-22 DIAGNOSIS — B349 Viral infection, unspecified: Secondary | ICD-10-CM | POA: Diagnosis present

## 2016-04-22 DIAGNOSIS — Z8249 Family history of ischemic heart disease and other diseases of the circulatory system: Secondary | ICD-10-CM | POA: Diagnosis not present

## 2016-04-22 DIAGNOSIS — D500 Iron deficiency anemia secondary to blood loss (chronic): Secondary | ICD-10-CM | POA: Diagnosis present

## 2016-04-22 DIAGNOSIS — Z7951 Long term (current) use of inhaled steroids: Secondary | ICD-10-CM | POA: Diagnosis not present

## 2016-04-22 DIAGNOSIS — J45901 Unspecified asthma with (acute) exacerbation: Secondary | ICD-10-CM | POA: Diagnosis present

## 2016-04-22 DIAGNOSIS — Z888 Allergy status to other drugs, medicaments and biological substances status: Secondary | ICD-10-CM | POA: Diagnosis not present

## 2016-04-22 DIAGNOSIS — R000 Tachycardia, unspecified: Secondary | ICD-10-CM | POA: Diagnosis present

## 2016-04-22 DIAGNOSIS — K219 Gastro-esophageal reflux disease without esophagitis: Secondary | ICD-10-CM | POA: Diagnosis present

## 2016-04-22 DIAGNOSIS — Z79899 Other long term (current) drug therapy: Secondary | ICD-10-CM | POA: Diagnosis not present

## 2016-04-22 MED ORDER — ALBUTEROL SULFATE (2.5 MG/3ML) 0.083% IN NEBU
2.5000 mg | INHALATION_SOLUTION | Freq: Four times a day (QID) | RESPIRATORY_TRACT | Status: DC
  Administered 2016-04-22 – 2016-04-24 (×12): 2.5 mg via RESPIRATORY_TRACT
  Filled 2016-04-22 (×12): qty 3

## 2016-04-22 MED ORDER — SODIUM CHLORIDE 0.9 % IV SOLN: 250.0000 mL | INTRAVENOUS | Status: DC | PRN

## 2016-04-22 MED ORDER — METHYLPREDNISOLONE SODIUM SUCC 125 MG IJ SOLR
80.0000 mg | Freq: Two times a day (BID) | INTRAMUSCULAR | Status: DC
  Administered 2016-04-22 – 2016-04-24 (×5): 80 mg via INTRAVENOUS
  Filled 2016-04-22 (×6): qty 1.28

## 2016-04-22 MED ORDER — FAMOTIDINE 20 MG PO TABS
20.0000 mg | ORAL_TABLET | Freq: Two times a day (BID) | ORAL | Status: DC
  Administered 2016-04-22 – 2016-04-25 (×8): 20 mg via ORAL
  Filled 2016-04-22 (×8): qty 1

## 2016-04-22 MED ORDER — ALBUTEROL SULFATE (2.5 MG/3ML) 0.083% IN NEBU
2.5000 mg | INHALATION_SOLUTION | Freq: Four times a day (QID) | RESPIRATORY_TRACT | Status: DC
  Administered 2016-04-22: 2.5 mg via RESPIRATORY_TRACT
  Filled 2016-04-22: qty 3

## 2016-04-22 MED ORDER — GUAIFENESIN ER 600 MG PO TB12
600.0000 mg | ORAL_TABLET | Freq: Two times a day (BID) | ORAL | Status: DC
  Administered 2016-04-22 – 2016-04-25 (×8): 600 mg via ORAL
  Filled 2016-04-22 (×8): qty 1

## 2016-04-22 MED ORDER — IPRATROPIUM BROMIDE 0.02 % IN SOLN: 0.5000 mg | Freq: Four times a day (QID) | RESPIRATORY_TRACT | Status: DC | PRN

## 2016-04-22 MED ORDER — SODIUM CHLORIDE 0.9% FLUSH: 3.0000 mL | INTRAVENOUS | Status: DC | PRN

## 2016-04-22 MED ORDER — MONTELUKAST SODIUM 10 MG PO TABS
10.0000 mg | ORAL_TABLET | Freq: Every day | ORAL | Status: DC
  Administered 2016-04-22 – 2016-04-24 (×4): 10 mg via ORAL
  Filled 2016-04-22 (×4): qty 1

## 2016-04-22 MED ORDER — HYDROCOD POLST-CPM POLST ER 10-8 MG/5ML PO SUER
5.0000 mL | Freq: Two times a day (BID) | ORAL | Status: DC | PRN
  Administered 2016-04-22: 5 mL via ORAL
  Filled 2016-04-22: qty 5

## 2016-04-22 MED ORDER — ALBUTEROL SULFATE (2.5 MG/3ML) 0.083% IN NEBU
2.5000 mg | INHALATION_SOLUTION | RESPIRATORY_TRACT | Status: DC | PRN
  Administered 2016-04-23: 2.5 mg via RESPIRATORY_TRACT
  Filled 2016-04-22: qty 3

## 2016-04-22 MED ORDER — SODIUM CHLORIDE 0.9% FLUSH
3.0000 mL | Freq: Two times a day (BID) | INTRAVENOUS | Status: DC
  Administered 2016-04-22 – 2016-04-25 (×8): 3 mL via INTRAVENOUS

## 2016-04-22 MED ORDER — IPRATROPIUM BROMIDE 0.02 % IN SOLN
0.5000 mg | Freq: Four times a day (QID) | RESPIRATORY_TRACT | Status: DC
  Administered 2016-04-22: 0.5 mg via RESPIRATORY_TRACT
  Filled 2016-04-22: qty 2.5

## 2016-04-22 MED ORDER — ACETAMINOPHEN 325 MG PO TABS
650.0000 mg | ORAL_TABLET | Freq: Four times a day (QID) | ORAL | Status: DC | PRN
  Administered 2016-04-22 – 2016-04-25 (×8): 650 mg via ORAL
  Filled 2016-04-22 (×8): qty 2

## 2016-04-22 NOTE — Progress Notes (Signed)
   DANIKAH STREBE [id number] DOB: 13-Mar-1976 DOA: 04/21/2016 PCP: Philis Fendt, MD  Brief narrative: 40 y/o ? H/o asthma admitted with significant congestion, wheeze sob  Past medical history-As per Problem list Chart reviewed as below-   Consultants:  nonoe  Procedures:  none  Antibiotics:  none   Subjective   Still hoarse. Coughing some No n/v Feels marginally better No cp, leg swelling or other findings   Objective    Interim History:   Telemetry:    Objective: Vitals:   04/22/16 0100 04/22/16 0219 04/22/16 0532 04/22/16 0917  BP:   (!) 126/55   Pulse: (!) 116  (!) 102 (!) 115  Resp:   20 18  Temp:   98.3 F (36.8 C)   TempSrc:   Oral   SpO2:  100% 99% 99%  Weight:      Height:        Intake/Output Summary (Last 24 hours) at 04/22/16 1221 Last data filed at 04/22/16 1029  Gross per 24 hour  Intake              290 ml  Output             1350 ml  Net            -1060 ml    Exam:  General: Morbid obesity, Body mass index is 51.76 kg/m. Cardiovascular: s1 s 2no m/r/g Respiratory: clear without wheeze Abdomen:  Soft nt nd no rebound no gaurd Skin no swellign Neuro intact moves all 4 libs  Data Reviewed: Basic Metabolic Panel:  Recent Labs Lab 04/21/16 1908  NA 136  K 3.5  CL 105  CO2 25  GLUCOSE 93  BUN 10  CREATININE 0.74  CALCIUM 8.8*   Liver Function Tests: No results for input(s): AST, ALT, ALKPHOS, BILITOT, PROT, ALBUMIN in the last 168 hours. No results for input(s): LIPASE, AMYLASE in the last 168 hours. No results for input(s): AMMONIA in the last 168 hours. CBC:  Recent Labs Lab 04/21/16 1908  WBC 12.9*  NEUTROABS 7.4  HGB 7.7*  HCT 28.5*  MCV 54.3*  PLT 420*   Cardiac Enzymes: No results for input(s): CKTOTAL, CKMB, CKMBINDEX, TROPONINI in the last 168 hours. BNP: Invalid input(s): POCBNP CBG: No results for input(s): GLUCAP in the last 168 hours.  No results found for this or any previous  visit (from the past 240 hour(s)).   Studies:              All Imaging reviewed and is as per above notation   Scheduled Meds: . albuterol  2.5 mg Nebulization QID  . famotidine  20 mg Oral BID  . guaiFENesin  600 mg Oral BID  . methylPREDNISolone (SOLU-MEDROL) injection  80 mg Intravenous Q12H  . montelukast  10 mg Oral QHS  . sodium chloride flush  3 mL Intravenous Q12H   Continuous Infusions:    Assessment/Plan:  1. Acute asthma exacerbation Cont solu-medrol 80 q 12, montelukast 10 qhs, mucinex 600 bid and scheduled albuterol  Menorrhagia Cont to monitor.  Hemoglobin7, very microcytic.  Needs iron on d/c home.  conside rOCP/IUD for this as OP   No famiyl Inpatient as cannot reliably be rx in only 24 hr   Verneita Griffes, MD  [facility] Pager [phone] 04/22/2016, 12:21 PM    LOS: 0 days

## 2016-04-23 NOTE — Progress Notes (Signed)
[name] [id number] DOB: 1976-07-10 DOA: 04/21/2016 PCP: Philis Fendt, MD  Brief narrative: 40 y/o ? H/o asthma admitted with significant congestion, wheeze sob  Past medical history-As per Problem list Chart reviewed as below-   Consultants:  nonoe  Procedures:  none  Antibiotics:  none   Subjective   Doing fair Needed a breathing treatment 0300 Feels marginally better but still very SOB going to RR which is not her norm tol diet No cp no fever nor chills   Objective    Interim History:   Telemetry:    Objective: Vitals:   04/22/16 2134 04/23/16 0337 04/23/16 0523 04/23/16 0744  BP: 136/83  (!) 160/91   Pulse: (!) 104  (!) 106   Resp: 16     Temp: 98.4 F (36.9 C)  98.4 F (36.9 C)   TempSrc: Oral  Oral   SpO2: 98% 99% 97% 97%  Weight:      Height:        Intake/Output Summary (Last 24 hours) at 04/23/16 1024 Last data filed at 04/23/16 0950  Gross per 24 hour  Intake              480 ml  Output                0 ml  Net              480 ml    Exam:  General: Morbid obesity, Body mass index is 51.76 kg/m. Cardiovascular: s1 s 2no m/r/g Respiratory: mild wheeze bilaterally Abdomen:  Soft nt nd no rebound no gaurd Skin no swellign Neuro intact moves all 4 libs  Data Reviewed: Basic Metabolic Panel:  Recent Labs Lab 04/21/16 1908  NA 136  K 3.5  CL 105  CO2 25  GLUCOSE 93  BUN 10  CREATININE 0.74  CALCIUM 8.8*   Liver Function Tests: No results for input(s): AST, ALT, ALKPHOS, BILITOT, PROT, ALBUMIN in the last 168 hours. No results for input(s): LIPASE, AMYLASE in the last 168 hours. No results for input(s): AMMONIA in the last 168 hours. CBC:  Recent Labs Lab 04/21/16 1908  WBC 12.9*  NEUTROABS 7.4  HGB 7.7*  HCT 28.5*  MCV 54.3*  PLT 420*   Cardiac Enzymes: No results for input(s): CKTOTAL, CKMB, CKMBINDEX, TROPONINI in the last 168 hours. BNP: Invalid input(s): POCBNP CBG: No results for  input(s): GLUCAP in the last 168 hours.  No results found for this or any previous visit (from the past 240 hour(s)).   Studies:              All Imaging reviewed and is as per above notation   Scheduled Meds: . albuterol  2.5 mg Nebulization QID  . famotidine  20 mg Oral BID  . guaiFENesin  600 mg Oral BID  . methylPREDNISolone (SOLU-MEDROL) injection  80 mg Intravenous Q12H  . montelukast  10 mg Oral QHS  . sodium chloride flush  3 mL Intravenous Q12H   Continuous Infusions:    Assessment/Plan:   Acute asthma exacerbation Cont solu-medrol 80 q 12 x 24 more hours, patient to be ambulated, montelukast 10 qhs, mucinex 600 bid and scheduled albuterol  Menorrhagia Cont to monitor.  Hemoglobin7, very microcytic.  Needs iron on d/c home.  Consider OCP/IUD for this as OP  Morbid obesity, Body mass index is 51.76 kg/m.  Likely has a restricitve component as well to breathing, will need OP counselling regarding wght loss options  No family Inpatient as cannot reliably be rx in only 24 hr   Verneita Griffes, MD  [facility] Pager [phone] 04/23/2016, 10:24 AM    LOS: 1 day

## 2016-04-24 MED ORDER — ALBUTEROL SULFATE (2.5 MG/3ML) 0.083% IN NEBU
2.5000 mg | INHALATION_SOLUTION | Freq: Three times a day (TID) | RESPIRATORY_TRACT | Status: DC
  Administered 2016-04-25: 2.5 mg via RESPIRATORY_TRACT
  Filled 2016-04-24 (×2): qty 3

## 2016-04-24 MED ORDER — METHYLPREDNISOLONE 4 MG PO TABS
40.0000 mg | ORAL_TABLET | Freq: Every day | ORAL | Status: DC
  Administered 2016-04-25: 40 mg via ORAL
  Filled 2016-04-24: qty 2

## 2016-04-24 MED ORDER — METHYLPREDNISOLONE 4 MG PO TABS
8.0000 mg | ORAL_TABLET | Freq: Every day | ORAL | Status: DC
Start: 2016-04-24 — End: 2016-04-24

## 2016-04-24 NOTE — Progress Notes (Signed)
[name] [id number] DOB: 11-Oct-1975 DOA: 04/21/2016 PCP: Philis Fendt, MD  Brief narrative: 40 y/o ? H/o asthma admitted with significant congestion, wheeze sob  Past medical history-As per Problem list Chart reviewed as below-   Consultants:  nonoe  Procedures:  none  Antibiotics:  none   Subjective   breathing ok No fever no chills marginally better Some mild wheeze   Objective    Interim History:   Telemetry:    Objective: Vitals:   04/23/16 2134 04/24/16 0540 04/24/16 0829 04/24/16 1128  BP: 135/66 99/61    Pulse: 87 68    Resp: 16 16    Temp: 98.4 F (36.9 C) 98.2 F (36.8 C)    TempSrc: Oral Oral    SpO2: 99% 100% 99% 99%  Weight:      Height:        Intake/Output Summary (Last 24 hours) at 04/24/16 1402 Last data filed at 04/24/16 1000  Gross per 24 hour  Intake             1370 ml  Output                0 ml  Net             1370 ml    Exam:  General: Morbid obesity, Body mass index is 51.76 kg/m. Cardiovascular: s1 s2 no m/r/g Respiratory: mild wheeze Abdomen:  Soft nt nd no rebound no gaurd Skin no swellign Neuro intact moves all 4 libs  Data Reviewed: Basic Metabolic Panel:  Recent Labs Lab 04/21/16 1908  NA 136  K 3.5  CL 105  CO2 25  GLUCOSE 93  BUN 10  CREATININE 0.74  CALCIUM 8.8*   Liver Function Tests: No results for input(s): AST, ALT, ALKPHOS, BILITOT, PROT, ALBUMIN in the last 168 hours. No results for input(s): LIPASE, AMYLASE in the last 168 hours. No results for input(s): AMMONIA in the last 168 hours. CBC:  Recent Labs Lab 04/21/16 1908  WBC 12.9*  NEUTROABS 7.4  HGB 7.7*  HCT 28.5*  MCV 54.3*  PLT 420*   Cardiac Enzymes: No results for input(s): CKTOTAL, CKMB, CKMBINDEX, TROPONINI in the last 168 hours. BNP: Invalid input(s): POCBNP CBG: No results for input(s): GLUCAP in the last 168 hours.  No results found for this or any previous visit (from the past 240 hour(s)).     Studies:              All Imaging reviewed and is as per above notation   Scheduled Meds: . albuterol  2.5 mg Nebulization QID  . famotidine  20 mg Oral BID  . guaiFENesin  600 mg Oral BID  . methylPREDNISolone  8 mg Oral Daily  . montelukast  10 mg Oral QHS  . sodium chloride flush  3 mL Intravenous Q12H   Continuous Infusions:    Assessment/Plan:   Acute asthma exacerbation Transition to PO medrol 40 daily , montelukast 10 qhs, mucinex 600 bid and scheduled albuterol--if stabilized can probably d/c in 24-48 hr  Menorrhagia Cont to monitor.  Hemoglobin7, very microcytic.  Needs iron on d/c home.  Consider OCP/IUD for this as OP  Morbid obesity, Body mass index is 51.76 kg/m.  Likely has a restricitve component as well to breathing, will need OP counselling regarding wght loss options   No family Inpatient as cannot reliably be rx in only 24 hr   Verneita Griffes, MD  [facility] Pager [phone] 04/24/2016,  2:02 PM    LOS: 2 days

## 2016-04-25 MED ORDER — METHYLPREDNISOLONE 8 MG PO TABS: 40.0000 mg | ORAL_TABLET | Freq: Every day | ORAL | 0 refills | Status: DC

## 2016-04-25 NOTE — Discharge Summary (Signed)
Physician Discharge Summary  [name] [id number] DOB: [birth year] DOA: 04/21/2016  PCP: Philis Fendt, MD  Admit date: 04/21/2016 Discharge date: 04/25/2016  Time spent: 25 minutes  Recommendations for Outpatient Follow-up:  1. Complete Medrol 40 mg burst 2. Labs in 2 weeks at pcp  Discharge Diagnoses:  Principal Problem:   Exacerbation of asthma Active Problems:   Anemia due to menorrhagia and fibroids   Acute asthma   Discharge Condition: improved  Diet recommendation: hh low salt  Filed Weights   04/21/16 1528  Weight: 128.4 kg (283 lb)    History of present illness:  40 y/o ? H/o asthma admitted with significant congestion, wheeze sob In a setting of probable Upper respiratory viral illness   Hospital Course:              Acute asthma exacerbation initally was on IV solu-mderol--> PO medrol 40 daily , montelukast 10 qhs, mucinex 600 bid and scheduled albuterol--stabilized and ok for d/c             Menorrhagia Cont to monitor.  Hemoglobin7, very microcytic.  Needs iron on d/c home.  Consider OCP/IUD for this as OP             Morbid obesity, Body mass index is 51.76 kg/m.  Likely has a restricitve component as well to breathing, will need OP counselling regarding wght loss options    Discharge Exam: Vitals:   04/24/16 2130 04/25/16 0544  BP: (!) 165/92 (!) 156/106  Pulse: 94 78  Resp: 18 16  Temp: 98 F (36.7 C) 98.5 F (36.9 C)    General: eomi ncat Cardiovascular:  s1 s 2no m/r/g Respiratory: clear without added sound, no wheeze app  Discharge Instructions   Discharge Instructions    Diet - low sodium heart healthy    Complete by:  As directed    Discharge instructions    Complete by:  As directed    Complete 5 days more total of oral steroids Take ibuprofen with food if needed Follow up in a couple of weeks with regular MD   Increase activity slowly    Complete by:  As directed      Current Discharge Medication List     CONTINUE these medications which have NOT CHANGED   Details  albuterol (PROVENTIL HFA;VENTOLIN HFA) 108 (90 Base) MCG/ACT inhaler Inhale 2 puffs into the lungs every 6 (six) hours as needed for wheezing or shortness of breath.    budesonide-formoterol (SYMBICORT) 160-4.5 MCG/ACT inhaler Inhale 2 puffs into the lungs 2 (two) times daily as needed (for shortness of breath).     cetirizine (ZYRTEC) 10 MG tablet Take 10 mg by mouth daily.    EPINEPHrine 0.3 mg/0.3 mL IJ SOAJ injection Inject 0.3 mg into the muscle once as needed for anaphylaxis. For up to 2 doses.    fluticasone (VERAMYST) 27.5 MCG/SPRAY nasal spray Place 2 sprays into the nose 2 (two) times daily.    ibuprofen (ADVIL,MOTRIN) 200 MG tablet Take 200 mg by mouth every 6 (six) hours as needed for headache.    ipratropium-albuterol (DUONEB) 0.5-2.5 (3) MG/3ML SOLN Take 3 mLs by nebulization every 6 (six) hours as needed. Qty: 360 mL, Refills: 2    montelukast (SINGULAIR) 10 MG tablet Take 1 tablet (10 mg total) by mouth at bedtime. Qty: 30 tablet, Refills: 11    omeprazole (PRILOSEC) 40 MG capsule Take 40 mg by mouth daily.    Prenatal Vit-Fe Fumarate-FA (PRENATAL MULTIVITAMIN) TABS  tablet Take 1 tablet by mouth daily at 12 noon.    ranitidine (ZANTAC) 300 MG tablet Take 300 mg by mouth daily.      STOP taking these medications     guaiFENesin (MUCINEX) 600 MG 12 hr tablet        Allergies  Allergen Reactions  . Bee Venom Anaphylaxis    Patient has an epi pen, also has not been stung before but was informed she is allergic  . Prednisone Hives    Patient has been rechallenged with prednisone and experienced the same symptoms of hives and face swelling.  She can tolerate methylprednisolone IV and PO.      The results of significant diagnostics from this hospitalization (including imaging, microbiology, ancillary and laboratory) are listed below for reference.    Significant Diagnostic Studies: Dg Chest 2  View  Result Date: 04/21/2016 CLINICAL DATA:  Asthma for 2 weeks EXAM: CHEST  2 VIEW COMPARISON:  12/04/2013 FINDINGS: Cardiomediastinal silhouette is stable. No acute infiltrate or pleural effusion. No pulmonary edema. Mild degenerative changes lower thoracic spine. IMPRESSION: No active cardiopulmonary disease. Electronically Signed   By: Lahoma Crocker M.D.   On: 04/21/2016 16:33    Microbiology: No results found for this or any previous visit (from the past 240 hour(s)).   Labs: Basic Metabolic Panel:  Recent Labs Lab 04/21/16 1908  NA 136  K 3.5  CL 105  CO2 25  GLUCOSE 93  BUN 10  CREATININE 0.74  CALCIUM 8.8*   Liver Function Tests: No results for input(s): AST, ALT, ALKPHOS, BILITOT, PROT, ALBUMIN in the last 168 hours. No results for input(s): LIPASE, AMYLASE in the last 168 hours. No results for input(s): AMMONIA in the last 168 hours. CBC:  Recent Labs Lab 04/21/16 1908  WBC 12.9*  NEUTROABS 7.4  HGB 7.7*  HCT 28.5*  MCV 54.3*  PLT 420*   Cardiac Enzymes: No results for input(s): CKTOTAL, CKMB, CKMBINDEX, TROPONINI in the last 168 hours. BNP: BNP (last 3 results) No results for input(s): BNP in the last 8760 hours.  ProBNP (last 3 results) No results for input(s): PROBNP in the last 8760 hours.  CBG: No results for input(s): GLUCAP in the last 168 hours.     SignedNita Sells MD   [facility] 04/25/2016, 11:09 AM

## 2016-04-25 NOTE — Care Management Important Message (Signed)
Important Message  Patient Details  Name: [name] MRN: [id number] Date of Birth: 07/16/1975   Medicare Important Message Given:  Yes    Erenest Rasher, RN 04/25/2016, 11:18 AM

## 2016-04-25 NOTE — Care Management Note (Signed)
Case Management Note  Patient Details  Name: [name] MRN: [id number] Date of Birth: Mar 22, 1976  Subjective/Objective:  Asthma exacerbation                  Action/Plan: Discharge Planning: AVS reviewed: NCM spoke to pt and lives at home with mother. Reports she has an aide that comes 3 hours per day Mon-Fri. Has neb machine at home. No needs identified.   PCP Nolene Ebbs MD  Expected Discharge Date:  04/25/2016              Expected Discharge Plan:  Home/Self Care  In-House Referral:  NA  Discharge planning Services  CM Consult  Post Acute Care Choice:  NA Choice offered to:  NA  DME Arranged:  N/A DME Agency:  NA  HH Arranged:  NA HH Agency:  NA  Status of Service:  Completed, signed off  If discussed at [place] of Stay Meetings, dates discussed:    Additional Comments:  Erenest Rasher, RN 04/25/2016, 11:22 AM

## 2016-04-25 NOTE — Progress Notes (Signed)
Discharge instructions gone over with pt with all questions answered. BP elevated a bit prior to D/C but it does fluctuate and pt states 160/102 is not abnormal for her.

## 2016-10-11 ENCOUNTER — Encounter (HOSPITAL_COMMUNITY): Payer: Self-pay | Admitting: Emergency Medicine

## 2016-10-11 ENCOUNTER — Inpatient Hospital Stay (HOSPITAL_COMMUNITY)
Admission: EM | Admit: 2016-10-11 | Discharge: 2016-10-15 | DRG: 202 | Disposition: A | Discharge status: Home / Self Care | Payer: Medicare HMO | Attending: Internal Medicine | Admitting: Internal Medicine

## 2016-10-11 ENCOUNTER — Emergency Department (HOSPITAL_COMMUNITY): Payer: Medicare HMO

## 2016-10-11 DIAGNOSIS — Z79899 Other long term (current) drug therapy: Secondary | ICD-10-CM | POA: Diagnosis not present

## 2016-10-11 DIAGNOSIS — N920 Excessive and frequent menstruation with regular cycle: Secondary | ICD-10-CM | POA: Diagnosis present

## 2016-10-11 DIAGNOSIS — R0603 Acute respiratory distress: Secondary | ICD-10-CM | POA: Diagnosis present

## 2016-10-11 DIAGNOSIS — T380X5A Adverse effect of glucocorticoids and synthetic analogues, initial encounter: Secondary | ICD-10-CM | POA: Diagnosis present

## 2016-10-11 DIAGNOSIS — Z888 Allergy status to other drugs, medicaments and biological substances status: Secondary | ICD-10-CM

## 2016-10-11 DIAGNOSIS — K219 Gastro-esophageal reflux disease without esophagitis: Secondary | ICD-10-CM | POA: Diagnosis present

## 2016-10-11 DIAGNOSIS — J4551 Severe persistent asthma with (acute) exacerbation: Secondary | ICD-10-CM | POA: Diagnosis not present

## 2016-10-11 DIAGNOSIS — I10 Essential (primary) hypertension: Secondary | ICD-10-CM | POA: Diagnosis present

## 2016-10-11 DIAGNOSIS — D72829 Elevated white blood cell count, unspecified: Secondary | ICD-10-CM | POA: Diagnosis present

## 2016-10-11 DIAGNOSIS — Z91030 Bee allergy status: Secondary | ICD-10-CM | POA: Diagnosis not present

## 2016-10-11 DIAGNOSIS — Z8249 Family history of ischemic heart disease and other diseases of the circulatory system: Secondary | ICD-10-CM | POA: Diagnosis not present

## 2016-10-11 DIAGNOSIS — D649 Anemia, unspecified: Secondary | ICD-10-CM

## 2016-10-11 DIAGNOSIS — E876 Hypokalemia: Secondary | ICD-10-CM | POA: Diagnosis not present

## 2016-10-11 DIAGNOSIS — D500 Iron deficiency anemia secondary to blood loss (chronic): Secondary | ICD-10-CM

## 2016-10-11 DIAGNOSIS — Z6841 Body Mass Index (BMI) 40.0 and over, adult: Secondary | ICD-10-CM | POA: Diagnosis not present

## 2016-10-11 DIAGNOSIS — Z7951 Long term (current) use of inhaled steroids: Secondary | ICD-10-CM | POA: Diagnosis not present

## 2016-10-11 DIAGNOSIS — J45901 Unspecified asthma with (acute) exacerbation: Secondary | ICD-10-CM | POA: Diagnosis present

## 2016-10-11 DIAGNOSIS — Z9049 Acquired absence of other specified parts of digestive tract: Secondary | ICD-10-CM

## 2016-10-11 DIAGNOSIS — J4541 Moderate persistent asthma with (acute) exacerbation: Secondary | ICD-10-CM | POA: Diagnosis present

## 2016-10-11 DIAGNOSIS — D259 Leiomyoma of uterus, unspecified: Secondary | ICD-10-CM | POA: Diagnosis present

## 2016-10-11 DIAGNOSIS — Z22322 Carrier or suspected carrier of Methicillin resistant Staphylococcus aureus: Secondary | ICD-10-CM

## 2016-10-11 LAB — CBC
HEMATOCRIT: 24.1 % — AB (ref 36.0–46.0)
HEMOGLOBIN: 6.3 g/dL — AB (ref 12.0–15.0)
MCH: 13.7 pg — AB (ref 26.0–34.0)
MCHC: 26.1 g/dL — ABNORMAL LOW (ref 30.0–36.0)
MCV: 52.4 fL — AB (ref 78.0–100.0)
Platelets: 389 10*3/uL (ref 150–400)
RBC: 4.6 MIL/uL (ref 3.87–5.11)
RDW: 22.4 % — ABNORMAL HIGH (ref 11.5–15.5)
WBC: 14.4 10*3/uL — ABNORMAL HIGH (ref 4.0–10.5)

## 2016-10-11 LAB — BASIC METABOLIC PANEL
ANION GAP: 10 (ref 5–15)
BUN: 7 mg/dL (ref 6–20)
CHLORIDE: 103 mmol/L (ref 101–111)
CO2: 24 mmol/L (ref 22–32)
Calcium: 8.4 mg/dL — ABNORMAL LOW (ref 8.9–10.3)
Creatinine, Ser: 0.74 mg/dL (ref 0.44–1.00)
GFR calc Af Amer: >60 mL/min (ref >60)
GFR calc non Af Amer: >60 mL/min (ref >60)
Glucose, Bld: 132 mg/dL — ABNORMAL HIGH (ref 65–99)
Potassium: 3 mmol/L — ABNORMAL LOW (ref 3.5–5.1)
Sodium: 137 mmol/L (ref 135–145)

## 2016-10-11 LAB — PREPARE RBC (CROSSMATCH)

## 2016-10-11 MED ORDER — SODIUM CHLORIDE 0.9% FLUSH: 3.0000 mL | INTRAVENOUS | Status: DC | PRN

## 2016-10-11 MED ORDER — ONDANSETRON HCL 4 MG PO TABS
4.0000 mg | ORAL_TABLET | Freq: Four times a day (QID) | ORAL | Status: DC | PRN
Start: 2016-10-11 — End: 2016-10-15

## 2016-10-11 MED ORDER — FLUTICASONE PROPIONATE 50 MCG/ACT NA SUSP
1.0000 | Freq: Every day | NASAL | Status: DC
  Administered 2016-10-12 – 2016-10-15 (×4): 1 via NASAL
  Filled 2016-10-11: qty 16

## 2016-10-11 MED ORDER — ALBUTEROL SULFATE (2.5 MG/3ML) 0.083% IN NEBU: 2.5000 mg | INHALATION_SOLUTION | RESPIRATORY_TRACT | Status: DC | PRN

## 2016-10-11 MED ORDER — ALBUTEROL SULFATE (2.5 MG/3ML) 0.083% IN NEBU
5.0000 mg | INHALATION_SOLUTION | Freq: Once | RESPIRATORY_TRACT | Status: AC
  Administered 2016-10-11: 5 mg via RESPIRATORY_TRACT
  Filled 2016-10-11: qty 6

## 2016-10-11 MED ORDER — SODIUM CHLORIDE 0.9% FLUSH
3.0000 mL | Freq: Two times a day (BID) | INTRAVENOUS | Status: DC
  Administered 2016-10-12 – 2016-10-14 (×6): 3 mL via INTRAVENOUS

## 2016-10-11 MED ORDER — POTASSIUM CHLORIDE CRYS ER 20 MEQ PO TBCR
40.0000 meq | EXTENDED_RELEASE_TABLET | Freq: Once | ORAL | Status: AC
  Administered 2016-10-12: 40 meq via ORAL
  Filled 2016-10-11: qty 2

## 2016-10-11 MED ORDER — PRENATAL PLUS 27-1 MG PO TABS
1.0000 | ORAL_TABLET | Freq: Every day | ORAL | Status: DC
  Administered 2016-10-12 – 2016-10-15 (×4): 1 via ORAL
  Filled 2016-10-11 (×4): qty 1

## 2016-10-11 MED ORDER — IPRATROPIUM BROMIDE 0.02 % IN SOLN
0.5000 mg | Freq: Once | RESPIRATORY_TRACT | Status: AC
  Administered 2016-10-11: 0.5 mg via RESPIRATORY_TRACT
  Filled 2016-10-11: qty 2.5

## 2016-10-11 MED ORDER — ONDANSETRON HCL 4 MG/2ML IJ SOLN: 4.0000 mg | Freq: Four times a day (QID) | INTRAMUSCULAR | Status: DC | PRN

## 2016-10-11 MED ORDER — LORATADINE 10 MG PO TABS
10.0000 mg | ORAL_TABLET | Freq: Every evening | ORAL | Status: DC
  Administered 2016-10-12 – 2016-10-14 (×3): 10 mg via ORAL
  Filled 2016-10-11 (×3): qty 1

## 2016-10-11 MED ORDER — SODIUM CHLORIDE 0.9 % IV SOLN
Freq: Once | INTRAVENOUS | Status: AC
  Administered 2016-10-11: 23:00:00 via INTRAVENOUS

## 2016-10-11 MED ORDER — TIOTROPIUM BROMIDE MONOHYDRATE 18 MCG IN CAPS
18.0000 ug | ORAL_CAPSULE | Freq: Every day | RESPIRATORY_TRACT | Status: DC
  Filled 2016-10-11: qty 5

## 2016-10-11 MED ORDER — ALBUTEROL (5 MG/ML) CONTINUOUS INHALATION SOLN
15.0000 mg/h | INHALATION_SOLUTION | RESPIRATORY_TRACT | Status: AC
  Administered 2016-10-11: 15 mg/h via RESPIRATORY_TRACT
  Filled 2016-10-11: qty 20

## 2016-10-11 MED ORDER — HYDROCODONE-ACETAMINOPHEN 5-325 MG PO TABS
1.0000 | ORAL_TABLET | ORAL | Status: DC | PRN
  Administered 2016-10-14 – 2016-10-15 (×2): 2 via ORAL
  Filled 2016-10-11 (×2): qty 2

## 2016-10-11 MED ORDER — SODIUM CHLORIDE 0.9% FLUSH
3.0000 mL | Freq: Two times a day (BID) | INTRAVENOUS | Status: DC
  Administered 2016-10-12: 3 mL via INTRAVENOUS

## 2016-10-11 MED ORDER — MAGNESIUM SULFATE 2 GM/50ML IV SOLN
2.0000 g | Freq: Once | INTRAVENOUS | Status: AC
  Administered 2016-10-11: 2 g via INTRAVENOUS
  Filled 2016-10-11: qty 50

## 2016-10-11 MED ORDER — MOMETASONE FURO-FORMOTEROL FUM 200-5 MCG/ACT IN AERO
2.0000 | INHALATION_SPRAY | Freq: Two times a day (BID) | RESPIRATORY_TRACT | Status: DC
  Administered 2016-10-12 – 2016-10-15 (×6): 2 via RESPIRATORY_TRACT
  Filled 2016-10-11 (×2): qty 8.8

## 2016-10-11 MED ORDER — ACETAMINOPHEN 325 MG PO TABS
650.0000 mg | ORAL_TABLET | Freq: Four times a day (QID) | ORAL | Status: DC | PRN
Start: 2016-10-11 — End: 2016-10-15
  Administered 2016-10-12 – 2016-10-14 (×4): 650 mg via ORAL
  Filled 2016-10-11 (×4): qty 2

## 2016-10-11 MED ORDER — SODIUM CHLORIDE 0.9 % IV SOLN: 250.0000 mL | INTRAVENOUS | Status: DC | PRN

## 2016-10-11 MED ORDER — METHYLPREDNISOLONE SODIUM SUCC 125 MG IJ SOLR
60.0000 mg | Freq: Four times a day (QID) | INTRAMUSCULAR | Status: DC
  Administered 2016-10-12 – 2016-10-13 (×5): 60 mg via INTRAVENOUS
  Filled 2016-10-11 (×6): qty 2

## 2016-10-11 MED ORDER — FAMOTIDINE 20 MG PO TABS
40.0000 mg | ORAL_TABLET | Freq: Every day | ORAL | Status: DC
  Administered 2016-10-12: 40 mg via ORAL
  Filled 2016-10-11: qty 2

## 2016-10-11 MED ORDER — ACETAMINOPHEN 650 MG RE SUPP: 650.0000 mg | Freq: Four times a day (QID) | RECTAL | Status: DC | PRN

## 2016-10-11 NOTE — ED Notes (Signed)
Attempted to call report and secretary took name & number to call back.

## 2016-10-11 NOTE — ED Notes (Signed)
Attempted a 2nd report call and was advised the nurse was not available.

## 2016-10-11 NOTE — Progress Notes (Signed)
CAT started. No significant Increase WOB noted. BBS are coarse expiratory wheeze w/ decrease aeration throughout. No signs of air hunger, prolong expiratory phase, accessory muscle usage, or acute respiratory distress noted at this time. Pt does resemble at this time some mild dyspnea at rest. Pt is able to talk in complete unbroken sentences. Pt SATs are 100% RA. Suggest patient to notify staff if she needs anything. Pt verbalize understanding.

## 2016-10-11 NOTE — ED Triage Notes (Signed)
[facility] advised the pt was at home and around 1900 this date began to have an asthma attack. EMS tried 1 due neb however pt still had wheezing. Pt originally did not want to come to the ED however EMS talked the pt into coming in for further evaluation. EMS advised they gave an additional duo neb while in route with no relief. EMS started an 18g IV in left AC. Vitals; 140/96, p-110, r-20, sats 100%.   Pt states she started to have an asthma attack and had to have somebody call 9-1-1 for her. Pt advised she felt better after the first duo neb but not 100% better. Pt states this is the worst asthma attack she has ever had.

## 2016-10-11 NOTE — H&P (Signed)
History and Physical    [name] KGM:[id number] DOB: [birth year] DOA: 10/11/2016  PCP: Philis Fendt, MD   Patient coming from: Home  Chief Complaint: SOB, wheezing   HPI: [name] is a 41 y.o. female with medical history significant for persistent asthma, menorrhagia and fibroids with microcytic anemia, and GERD who presents to the emergency department with acute onset of dyspnea and wheezing. Patient reports that she been in her usual state of health and was having an uneventful day when she went to visit her brother at his home this evening. She reports that it was very warm and stuffy inside of his house, noting that this setting frequently exacerbates her asthma and she keeps her home cool and with fans on to avoid this. She began to experience dyspnea with expiratory wheezing at approximately 9 PM this evening and her condition progressively worsened since that time. She reports walking out to her car for her inhaler, but being so short of breath that she had to ask a stranger outside for help. EMS was called to help and the patient was treated with nebs, 2 g of IV magnesium, and 125 mg IV Solu-Medrol and route to the hospital. Patient denies any recent fevers or chills, denies lower extremity tenderness or swelling, and denies orthopnea, chest pain, or palpitations. She reports her symptoms as consistent with prior asthma exacerbations, but notes that this is the most severe that she has ever had. She has not been on antibiotics or steroids recently. Patient notes heavy periods and history of fibroids. She is not currently menstruating. She denies any abdominal pain, nausea, diarrhea, melena, or hematochezia. There has been no hematuria noted.   ED Course: Upon arrival to the ED, patient is found to be afebrile, saturating adequately on room air, tachypneic, tachycardic and 120s, and with stable blood pressure. Patient is in acute respiratory distress on arrival. EKG featured a sinus  tachycardia with rate 123 and diffuse nonspecific repolarization abnormalities. Chest x-ray is notable for airway thickening consistent with reactive airways disease or bronchitis. Chemistry panel features a potassium of 3.0 and CBC is notable for hemoglobin of 6.3 with MCV of 52.4. Hemoglobin had been 7.7 six months ago. She was treated with nebs in the ED and transfused with 1 unit of packed red blood cells. She has shown improvement in her respiratory status and remains hemodynamically stable, but continues to have labored breathing while at rest and will be admitted to the stepdown unit for ongoing evaluation and management of asthma with acute exacerbation and acute on chronic microcytic anemia with hemoglobin of 6.3.  Review of Systems:  All other systems reviewed and apart from HPI, are negative.  Past Medical History:  Diagnosis Date  . Anemia   . Asthma   . Blood transfusion without reported diagnosis   . Environmental allergies   . Fibroid, uterine   . Fibroids   . GERD (gastroesophageal reflux disease)   . Menorrhagia   . Shortness of breath     Past Surgical History:  Procedure Laterality Date  . CHOLECYSTECTOMY     06/29/2011  . CHOLECYSTECTOMY  06/29/2011   Procedure: LAPAROSCOPIC CHOLECYSTECTOMY WITH INTRAOPERATIVE CHOLANGIOGRAM;  Surgeon: Merrie Roof, MD;  Location: [facility];  Service: General;  Laterality: N/A;  . ECTOPIC PREGNANCY SURGERY    . ERCP  06/30/2011   Procedure: ENDOSCOPIC RETROGRADE CHOLANGIOPANCREATOGRAPHY (ERCP);  Surgeon: Jeryl Columbia, MD;  Location: [facility] ENDOSCOPY;  Service: Endoscopy;  Laterality: N/A;  probable sphincterotomy  . ERCP  07/03/2011   Procedure: ENDOSCOPIC RETROGRADE CHOLANGIOPANCREATOGRAPHY (ERCP);  Surgeon: Landry Dyke, MD;  Location: [facility];  Service: Endoscopy;  Laterality: N/A;  PROPOFOL  . MYOMECTOMY    . MYOMECTOMY N/A 11/14/2012   Procedure: MYOMECTOMY;  Surgeon: Osborne Oman, MD;  Location: [facility];  Service: Gynecology;   Laterality: N/A;  . NASAL POLYP EXCISION  2016     reports that she has never smoked. She has never used smokeless tobacco. She reports that she does not drink alcohol or use drugs.  Allergies  Allergen Reactions  . Bee Venom Anaphylaxis    Patient has an epi pen, also has not been stung before but was informed she is allergic  . Prednisone Hives    Patient has been rechallenged with prednisone and experienced the same symptoms of hives and face swelling.  She can tolerate methylprednisolone IV and PO.    Family History  Problem Relation Age of Onset  . Hypertension Mother   . Diabetes Mother   . Hypertension Father   . Hypertension Brother   . Anesthesia problems Neg Hx   . Hypotension Neg Hx   . Malignant hyperthermia Neg Hx   . Pseudochol deficiency Neg Hx      Prior to Admission medications   Medication Sig Start Date End Date Taking? Authorizing Provider  albuterol (PROVENTIL HFA;VENTOLIN HFA) 108 (90 Base) MCG/ACT inhaler Inhale 2 puffs into the lungs every 6 (six) hours as needed for wheezing or shortness of breath.   Yes Historical Provider, MD  budesonide-formoterol (SYMBICORT) 160-4.5 MCG/ACT inhaler Inhale 2 puffs into the lungs 2 (two) times daily as needed (for shortness of breath).    Yes Historical Provider, MD  cetirizine (ZYRTEC) 10 MG tablet Take 10 mg by mouth daily.   Yes Historical Provider, MD  EPINEPHrine 0.3 mg/0.3 mL IJ SOAJ injection Inject 0.3 mg into the muscle once as needed for anaphylaxis. For up to 2 doses. 03/26/16  Yes Historical Provider, MD  fluticasone (VERAMYST) 27.5 MCG/SPRAY nasal spray Place 2 sprays into the nose 2 (two) times daily as needed for rhinitis or allergies.    Yes Historical Provider, MD  ibuprofen (ADVIL,MOTRIN) 200 MG tablet Take 200 mg by mouth every 6 (six) hours as needed for headache.   Yes Historical Provider, MD  ipratropium-albuterol (DUONEB) 0.5-2.5 (3) MG/3ML SOLN Take 3 mLs by nebulization every 6 (six) hours as  needed. Patient taking differently: Take 3 mLs by nebulization every 6 (six) hours as needed (for shortness of breath).  12/07/13  Yes Hosie Poisson, MD  levocetirizine (XYZAL) 5 MG tablet Take 5 mg by mouth every evening.   Yes Historical Provider, MD  Prenatal Vit-Fe Fumarate-FA (PRENATAL MULTIVITAMIN) TABS tablet Take 1 tablet by mouth daily at 12 noon.   Yes Historical Provider, MD  ranitidine (ZANTAC) 300 MG tablet Take 300 mg by mouth every evening.  02/29/16  Yes Historical Provider, MD  Tiotropium Bromide Monohydrate (SPIRIVA RESPIMAT) 1.25 MCG/ACT AERS Inhale 2 puffs into the lungs daily.   Yes Historical Provider, MD    Physical Exam: Vitals:   10/11/16 2230 10/11/16 2246 10/11/16 2300 10/11/16 2302  BP: 135/78  109/61 109/61  Pulse: (!) 134  (!) 126 (!) 126  Resp: (!) 24  (!) 28 (!) 24  Temp:  98.4 F (36.9 C) 98.1 F (36.7 C) 98.1 F (36.7 C)  TempSrc:   Oral Oral  SpO2: 100%  100%   Weight:  Height:          Constitutional: Acute respiratory distress with tachypnea and accessory muscle recruitment; non-toxic appearing, no diaphoresis Eyes: PERTLA, lids and conjunctivae normal ENMT: Mucous membranes are moist. Posterior pharynx clear of any exudate or lesions.   Neck: normal, supple, no masses, no thyromegaly Respiratory: Labored breathing. Mildly diminished bilaterally with prolonged expiratory phase and expiratory wheezes. No pallor or cyanosis.  Cardiovascular: Rate ~120 and regular. No significant JVD. Abdomen: No distension, no tenderness, no masses palpated. Bowel sounds normal.  Musculoskeletal: no clubbing / cyanosis. No joint deformity upper and lower extremities.    Skin: no significant rashes, lesions, ulcers. Warm, dry, well-perfused. Neurologic: CN 2-12 grossly intact. Sensation intact, DTR normal. Strength 5/5 in all 4 limbs.  Psychiatric: Alert and oriented x 3. Normal mood and affect.     Labs on Admission: I have personally reviewed following labs  and imaging studies  CBC:  Recent Labs Lab 10/11/16 2051  WBC 14.4*  HGB 6.3*  HCT 24.1*  MCV 52.4*  PLT 761   Basic Metabolic Panel:  Recent Labs Lab 10/11/16 2051  NA 137  K 3.0*  CL 103  CO2 24  GLUCOSE 132*  BUN 7  CREATININE 0.74  CALCIUM 8.4*   GFR: Estimated Creatinine Clearance: 120.5 mL/min (by C-G formula based on SCr of 0.74 mg/dL). Liver Function Tests: No results for input(s): AST, ALT, ALKPHOS, BILITOT, PROT, ALBUMIN in the last 168 hours. No results for input(s): LIPASE, AMYLASE in the last 168 hours. No results for input(s): AMMONIA in the last 168 hours. Coagulation Profile: No results for input(s): INR, PROTIME in the last 168 hours. Cardiac Enzymes: No results for input(s): CKTOTAL, CKMB, CKMBINDEX, TROPONINI in the last 168 hours. BNP (last 3 results) No results for input(s): PROBNP in the last 8760 hours. HbA1C: No results for input(s): HGBA1C in the last 72 hours. CBG: No results for input(s): GLUCAP in the last 168 hours. Lipid Profile: No results for input(s): CHOL, HDL, LDLCALC, TRIG, CHOLHDL, LDLDIRECT in the last 72 hours. Thyroid Function Tests: No results for input(s): TSH, T4TOTAL, FREET4, T3FREE, THYROIDAB in the last 72 hours. Anemia Panel: No results for input(s): VITAMINB12, FOLATE, FERRITIN, TIBC, IRON, RETICCTPCT in the last 72 hours. Urine analysis:    Component Value Date/Time   COLORURINE YELLOW 11/04/2015 1215   APPEARANCEUR HAZY (A) 11/04/2015 1215   LABSPEC >1.030 (H) 11/04/2015 1215   PHURINE 5.5 11/04/2015 1215   GLUCOSEU NEGATIVE 11/04/2015 1215   HGBUR TRACE (A) 11/04/2015 1215   BILIRUBINUR NEGATIVE 11/04/2015 1215   KETONESUR NEGATIVE 11/04/2015 1215   PROTEINUR NEGATIVE 11/04/2015 1215   UROBILINOGEN 0.2 07/25/2012 1708   NITRITE NEGATIVE 11/04/2015 1215   LEUKOCYTESUR SMALL (A) 11/04/2015 1215   Sepsis Labs: @LABRCNTIP (procalcitonin:4,lacticidven:4) )No results found for this or any previous visit  (from the past 240 hour(s)).   Radiological Exams on Admission: Dg Chest Portable 1 View  Result Date: 10/11/2016 CLINICAL DATA:  Shortness of breath. EXAM: PORTABLE CHEST 1 VIEW COMPARISON:  05/26/2016 FINDINGS: The cardiac silhouette appears slightly enlarged. There is mild central airway thickening. No confluent airspace opacity, overt pulmonary edema, pleural effusion, or pneumothorax is identified. No acute osseous abnormality is seen. IMPRESSION: Airway thickening which may reflect reactive airways disease or bronchitis. Electronically Signed   By: Logan Bores M.D.   On: 10/11/2016 21:08    EKG: Independently reviewed. Sinus tachycardia (rate 123), diffuse non-specific repolarization abnormality, likely rate-related  Assessment/Plan  1. Asthma with acute exacerbation  -  Pt has persistent asthma managed at home with Symbicort, Spiriva, and prn albuterol MDI; she has never been intubated for this; she reports using albuterol MDI on daily basis at baseline  - Presents in acute exacerbation, likely secondary to environmental triggers, no evidence for infection  - Treated by EMS with 125 mg IV Solu-Medrol, 2 g IV magnesium, and nebs en route  - Given continuous albuterol neb in ED with improvement, but still in distress while at rest  - Plan to admit to SDU given her critical respiratory status, continue systemic steroid, scheduled Spiriva and Symbicort, and prn albuterol nebs    2. Microcytic anemia  - Hgb is 6.3 on admission with MCV 52.4  - She has chronic microcytic anemia attributed to fibroids with menorrhagia, recently started on iron-supplements  - Denies melena or hematochezia, not currently menstruating  - She is being transfused with 1 unit pRBC at time of admission; check post-transfusion CBC  - Anemia panel added-on to initial blood draw   3. Hypokalemia  - Serum potassium is 3.0 on admission with non-specific repol abnormality on EKG  - Likely secondary to albuterol and  intracellular shifts  - She had been given 2 g IV magnesium en route with EMS and 40 mEq oral potassium in ED  - Continue cardiac monitoring and repeat chem panel in am    DVT prophylaxis: SCD's Code Status: Full  Family Communication: Discussed with patient Disposition Plan: Admit to SDU Consults called: None Admission status: Inpatient    Vianne Bulls, MD [facility] Pager [phone]  If 7PM-7AM, please contact night-coverage [email or web address] Password [email or web address]  10/11/2016, 11:25 PM

## 2016-10-12 LAB — BASIC METABOLIC PANEL
Anion gap: 16 — ABNORMAL HIGH (ref 5–15)
BUN: 8 mg/dL (ref 6–20)
CHLORIDE: 105 mmol/L (ref 101–111)
CO2: 15 mmol/L — ABNORMAL LOW (ref 22–32)
Calcium: 8.4 mg/dL — ABNORMAL LOW (ref 8.9–10.3)
Creatinine, Ser: 0.92 mg/dL (ref 0.44–1.00)
GFR calc non Af Amer: >60 mL/min (ref >60)
Glucose, Bld: 216 mg/dL — ABNORMAL HIGH (ref 65–99)
POTASSIUM: 2.9 mmol/L — AB (ref 3.5–5.1)
SODIUM: 136 mmol/L (ref 135–145)

## 2016-10-12 LAB — CBC WITH DIFFERENTIAL/PLATELET
Basophils Absolute: 0 10*3/uL (ref 0.0–0.1)
Basophils Relative: 0 %
EOS PCT: 0 %
Eosinophils Absolute: 0 10*3/uL (ref 0.0–0.7)
HEMATOCRIT: 26.8 % — AB (ref 36.0–46.0)
HEMOGLOBIN: 7.2 g/dL — AB (ref 12.0–15.0)
Lymphocytes Relative: 6 %
Lymphs Abs: 1 10*3/uL (ref 0.7–4.0)
MCH: 14.9 pg — ABNORMAL LOW (ref 26.0–34.0)
MCHC: 26.9 g/dL — ABNORMAL LOW (ref 30.0–36.0)
MCV: 55.4 fL — ABNORMAL LOW (ref 78.0–100.0)
MONOS PCT: 1 %
Monocytes Absolute: 0.2 10*3/uL (ref 0.1–1.0)
NEUTROS PCT: 93 %
Neutro Abs: 15 10*3/uL — ABNORMAL HIGH (ref 1.7–7.7)
Platelets: 387 10*3/uL (ref 150–400)
RBC: 4.84 MIL/uL (ref 3.87–5.11)
RDW: 25.9 % — AB (ref 11.5–15.5)
WBC: 16.2 10*3/uL — AB (ref 4.0–10.5)

## 2016-10-12 LAB — GLUCOSE, CAPILLARY
GLUCOSE-CAPILLARY: 170 mg/dL — AB (ref 65–99)
GLUCOSE-CAPILLARY: 117 mg/dL — AB (ref 65–99)
Glucose-Capillary: 133 mg/dL — ABNORMAL HIGH (ref 65–99)
Glucose-Capillary: 132 mg/dL — ABNORMAL HIGH (ref 65–99)

## 2016-10-12 LAB — BPAM RBC
BLOOD PRODUCT EXPIRATION DATE: 201804282359
ISSUE DATE / TIME: 201804012234
UNIT TYPE AND RH: 5100

## 2016-10-12 LAB — IRON AND TIBC
IRON: 9 ug/dL — AB (ref 28–170)
Saturation Ratios: 2 % — ABNORMAL LOW (ref 10.4–31.8)
TIBC: 462 ug/dL — AB (ref 250–450)
UIBC: 453 ug/dL

## 2016-10-12 LAB — TYPE AND SCREEN
ABO/RH(D): O POS
ANTIBODY SCREEN: NEGATIVE
Unit division: 0

## 2016-10-12 LAB — FOLATE: FOLATE: 12.7 ng/mL (ref >5.9)

## 2016-10-12 LAB — PREGNANCY, URINE: Preg Test, Ur: NEGATIVE

## 2016-10-12 LAB — MRSA PCR SCREENING: MRSA BY PCR: POSITIVE — AB

## 2016-10-12 LAB — FERRITIN: FERRITIN: 3 ng/mL — AB (ref 11–307)

## 2016-10-12 LAB — RETICULOCYTES
RBC.: 4.84 MIL/uL (ref 3.87–5.11)
Retic Count, Absolute: 53.2 10*3/uL (ref 19.0–186.0)
Retic Ct Pct: 1.1 % (ref 0.4–3.1)

## 2016-10-12 LAB — HIV ANTIBODY (ROUTINE TESTING W REFLEX): HIV Screen 4th Generation wRfx: NONREACTIVE

## 2016-10-12 LAB — VITAMIN B12: Vitamin B-12: 556 pg/mL (ref 180–914)

## 2016-10-12 MED ORDER — INSULIN ASPART 100 UNIT/ML ~~LOC~~ SOLN: 0.0000 [IU] | Freq: Every day | SUBCUTANEOUS | Status: DC

## 2016-10-12 MED ORDER — MUPIROCIN 2 % EX OINT
1.0000 "application " | TOPICAL_OINTMENT | Freq: Two times a day (BID) | CUTANEOUS | Status: DC
  Administered 2016-10-12 – 2016-10-15 (×7): 1 via NASAL
  Filled 2016-10-12: qty 22

## 2016-10-12 MED ORDER — IPRATROPIUM-ALBUTEROL 0.5-2.5 (3) MG/3ML IN SOLN
3.0000 mL | RESPIRATORY_TRACT | Status: DC
  Administered 2016-10-12: 3 mL via RESPIRATORY_TRACT
  Filled 2016-10-12: qty 3

## 2016-10-12 MED ORDER — INSULIN ASPART 100 UNIT/ML ~~LOC~~ SOLN
0.0000 [IU] | Freq: Three times a day (TID) | SUBCUTANEOUS | Status: DC
  Administered 2016-10-12: 2 [IU] via SUBCUTANEOUS
  Administered 2016-10-12: 3 [IU] via SUBCUTANEOUS

## 2016-10-12 MED ORDER — POTASSIUM CHLORIDE CRYS ER 20 MEQ PO TBCR
40.0000 meq | EXTENDED_RELEASE_TABLET | ORAL | Status: AC
  Administered 2016-10-12 (×2): 40 meq via ORAL
  Filled 2016-10-12 (×2): qty 2

## 2016-10-12 MED ORDER — PANTOPRAZOLE SODIUM 40 MG PO TBEC
40.0000 mg | DELAYED_RELEASE_TABLET | Freq: Every day | ORAL | Status: DC
  Administered 2016-10-12: 40 mg via ORAL
  Filled 2016-10-12: qty 1

## 2016-10-12 MED ORDER — CHLORHEXIDINE GLUCONATE CLOTH 2 % EX PADS
6.0000 | MEDICATED_PAD | Freq: Every day | CUTANEOUS | Status: AC
  Administered 2016-10-12 – 2016-10-15 (×4): 6 via TOPICAL

## 2016-10-12 MED ORDER — IPRATROPIUM-ALBUTEROL 0.5-2.5 (3) MG/3ML IN SOLN
3.0000 mL | Freq: Three times a day (TID) | RESPIRATORY_TRACT | Status: DC
  Administered 2016-10-12 – 2016-10-15 (×9): 3 mL via RESPIRATORY_TRACT
  Filled 2016-10-12 (×10): qty 3

## 2016-10-12 NOTE — Plan of Care (Signed)
Problem: Education: Goal: Knowledge of  General Education information/materials will improve Outcome: Progressing Discussed with the patients the importance of using her call bell for assistance with some teach back displayed

## 2016-10-12 NOTE — Progress Notes (Signed)
PROGRESS NOTE    [name]  YSA:[id number]  DOB: 1976-03-10  DOA: 10/11/2016 PCP: Philis Fendt, MD Outpatient Specialists: Pulmologist: Jeanella Cara ([facility])   Brief Admission Hx: 41 year old female asthmatic presented to ED by EMS with severe asthma exacerbation, admitted to SDU (see H&P)  Assessment & Plan:   1. Asthma with acute exacerbation / Moderate Persistent Asthma  - Pt has persistent asthma managed at home with Symbicort, Spiriva, and prn albuterol MDI; she has never been intubated for this; she reports using albuterol MDI on daily basis at baseline  - Presents in acute exacerbation, likely secondary to environmental triggers, no evidence for infection  - continue systemic steroid, scheduled duonebs and also symbicort   2. 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   - Follow anemia panel. - Continue prenatal vitamins daily.   3. Hypokalemia  - Serum potassium is 3.0 on admission with non-specific repol abnormality on EKG  - Likely secondary to albuterol and intracellular shifts  - She had been given 2 g IV magnesium en route with EMS and 40 mEq oral potassium in ED  - Continue cardiac monitoring and repeat chem panel in am    DVT prophylaxis: SCD's Code Status: Full  Family Communication: Discussed with patient Disposition Plan: Admit to SDU Consults called: None Admission status: Inpatient    Subjective: Pt still very short of breath but starting to feel a little better, wants to try to ambulate to bathroom today  Objective: Vitals:   10/12/16 0400 10/12/16 0500 10/12/16 0600 10/12/16 0743  BP: 128/71  (!) 108/52 130/72  Pulse: (!) 129 (!) 118  (!) 117 (!) 114  Resp: (!) 25 (!) 30 (!) 26 (!) 28  Temp:    98.4 F (36.9 C)  TempSrc:    Oral  SpO2: 100% 98% 97% 98%  Weight:      Height:        Intake/Output Summary (Last 24 hours) at 10/12/16 0755 Last data filed at 10/12/16 0600  Gross per 24 hour  Intake           932.83 ml  Output              300 ml  Net           632.83 ml   Filed Weights   10/11/16 2026 10/12/16 0024  Weight: 131.1 kg (289 lb) 130.8 kg (288 lb 5.8 oz)    Exam:  General exam: awake, alert, NAD. Cooperative.  Respiratory system: tachypneic. Diffuse exp wheezes heard.   Cardiovascular system: S1 & S2 heard, tachycardic. No JVD, murmurs, gallops, clicks or pedal edema. Gastrointestinal system: Abdomen is nondistended, soft and nontender. Normal bowel sounds heard. Central nervous system: Alert and oriented. No focal neurological deficits. Extremities: no cyanosis.   Data Reviewed: Basic Metabolic Panel:  Recent Labs Lab 10/11/16 2051 10/12/16 0355  NA 137 136  K 3.0* 2.9*  CL 103 105  CO2 24 15*  GLUCOSE 132* 216*  BUN 7 8  CREATININE 0.74 0.92  CALCIUM 8.4* 8.4*   Liver Function Tests: No results for input(s): AST, ALT, ALKPHOS, BILITOT, PROT, ALBUMIN in the last 168 hours. No results for input(s): LIPASE,  AMYLASE in the last 168 hours. No results for input(s): AMMONIA in the last 168 hours. CBC:  Recent Labs Lab 10/11/16 2051 10/12/16 0355  WBC 14.4* 16.2*  NEUTROABS  --  15.0*  HGB 6.3* 7.2*  HCT 24.1* 26.8*  MCV 52.4* 55.4*  PLT 389 387   Cardiac Enzymes: No results for input(s): CKTOTAL, CKMB, CKMBINDEX, TROPONINI in the last 168 hours. CBG (last 3)  No results for input(s): GLUCAP in the last 72 hours. Recent Results (from the past 240 hour(s))  MRSA PCR Screening     Status: Abnormal   Collection Time: 10/12/16 12:47 AM  Result Value Ref Range Status   MRSA by PCR POSITIVE (A) NEGATIVE Final    Comment:        The GeneXpert MRSA Assay (FDA approved for NASAL  specimens only), is one component of a comprehensive MRSA colonization surveillance program. It is not intended to diagnose MRSA infection nor to guide or monitor treatment for MRSA infections. RESULT CALLED TO, READ BACK BY AND VERIFIED WITH: RN Jerrye Bushy [id number] @[id number]  THANEY      Studies: Dg Chest Portable 1 View  Result Date: 10/11/2016 CLINICAL DATA:  Shortness of breath. EXAM: PORTABLE CHEST 1 VIEW COMPARISON:  05/26/2016 FINDINGS: The cardiac silhouette appears slightly enlarged. There is mild central airway thickening. No confluent airspace opacity, overt pulmonary edema, pleural effusion, or pneumothorax is identified. No acute osseous abnormality is seen. IMPRESSION: Airway thickening which may reflect reactive airways disease or bronchitis. Electronically Signed   By: Logan Bores M.D.   On: 10/11/2016 21:08     Scheduled Meds: . Chlorhexidine Gluconate Cloth  6 each Topical Q0600  . famotidine  40 mg Oral QHS  . fluticasone  1 spray Each Nare Daily  . loratadine  10 mg Oral QPM  . methylPREDNISolone (SOLU-MEDROL) injection  60 mg Intravenous Q6H  . mometasone-formoterol  2 puff Inhalation BID  . mupirocin ointment  1 application Nasal BID  . potassium chloride  40 mEq Oral Q4H  . prenatal vitamin w/FE, FA  1 tablet Oral Q1200  . sodium chloride flush  3 mL Intravenous Q12H  . sodium chloride flush  3 mL Intravenous Q12H  . tiotropium  18 mcg Inhalation Daily   Continuous Infusions:  Principal Problem:   Asthma exacerbation Active Problems:   Anemia due to menorrhagia and fibroids   Hypokalemia   Critical Care Time spent: 50 mins  Irwin Brakeman, MD, FAAFP [facility] Pager [phone]  If 7PM-7AM, please contact night-coverage [email or web address] Password [email or web address] 10/12/2016, 7:55 AM    LOS: 1 day

## 2016-10-12 NOTE — Plan of Care (Signed)
Problem: Respiratory: Goal: Respiratory status will improve Outcome: Progressing Discussed with patient about getting up slowly and waiting to catch her breathe when moving with some teach back displayed.

## 2016-10-12 NOTE — ED Provider Notes (Signed)
[facility] DEPT Provider Note   CSN: [id number] Arrival date & time: 10/11/16  2019     History   Chief Complaint Chief Complaint  Patient presents with  . Shortness of Breath    HPI [name] is a 41 y.o. female.  HPI   Presents with concern for shortness of breath. Patient reports symptoms developed several hours prior to arrival, and were accompanied by cough.  Denies significant improvement with albuterol at home. Reports that the symptoms are consistent with her prior asthma exacerbations. Reports she has been admitted to the hospital for asthma, however has never been admitted to the ICU intubated. Reports this is the worst asthma exacerbation she has ever had, rating it a 10 out of 10. Reports she was too short of breath to initially call EMS herself, and someone else had to do for her. She received Solu-Medrol and albuterol with EMS with some improvement.  Denies fevers, chest pain, congestion. Reports she has a history of heavy menstrual periods, however no current bleeding. Denies black or bloody stools.  Past Medical History:  Diagnosis Date  . Anemia   . Asthma   . Blood transfusion without reported diagnosis   . Environmental allergies   . Fibroid, uterine   . Fibroids   . GERD (gastroesophageal reflux disease)   . Menorrhagia   . Shortness of breath     Patient Active Problem List   Diagnosis Date Noted  . Asthma exacerbation 10/11/2016  . Hypokalemia 10/11/2016  . Acute asthma 04/22/2016  . Acute sinusitis, unspecified 03/26/2014  . Essential hypertension, benign 12/08/2013  . 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 Fibroids 08/18/2012  . Menorrhagia 08/18/2012  . Extrinsic asthma  08/15/2011    Past Surgical History:  Procedure Laterality Date  . CHOLECYSTECTOMY     06/29/2011  . CHOLECYSTECTOMY  06/29/2011   Procedure: LAPAROSCOPIC CHOLECYSTECTOMY WITH INTRAOPERATIVE  CHOLANGIOGRAM;  Surgeon: Merrie Roof, MD;  Location: [facility];  Service: General;  Laterality: N/A;  . ECTOPIC PREGNANCY SURGERY    . ERCP  06/30/2011   Procedure: ENDOSCOPIC RETROGRADE CHOLANGIOPANCREATOGRAPHY (ERCP);  Surgeon: Jeryl Columbia, MD;  Location: [facility] ENDOSCOPY;  Service: Endoscopy;  Laterality: N/A;  probable sphincterotomy  . ERCP  07/03/2011   Procedure: ENDOSCOPIC RETROGRADE CHOLANGIOPANCREATOGRAPHY (ERCP);  Surgeon: Landry Dyke, MD;  Location: [facility];  Service: Endoscopy;  Laterality: N/A;  PROPOFOL  . MYOMECTOMY    . MYOMECTOMY N/A 11/14/2012   Procedure: MYOMECTOMY;  Surgeon: Osborne Oman, MD;  Location: [facility];  Service: Gynecology;  Laterality: N/A;  . NASAL POLYP EXCISION  2016    OB History    Gravida Para Term Preterm AB Living   1       1     SAB TAB Ectopic Multiple Live Births       1           Home Medications    Prior to Admission medications   Medication Sig Start Date End Date Taking? Authorizing Provider  albuterol (PROVENTIL HFA;VENTOLIN HFA) 108 (90 Base) MCG/ACT inhaler Inhale 2 puffs into the lungs every 6 (six) hours as needed for wheezing or shortness of breath.   Yes Historical Provider, MD  budesonide-formoterol (SYMBICORT) 160-4.5 MCG/ACT inhaler Inhale 2 puffs into the lungs 2 (two) times daily as needed (for shortness of breath).    Yes Historical Provider, MD  cetirizine (ZYRTEC) 10 MG tablet Take 10 mg  by mouth daily.   Yes Historical Provider, MD  EPINEPHrine 0.3 mg/0.3 mL IJ SOAJ injection Inject 0.3 mg into the muscle once as needed for anaphylaxis. For up to 2 doses. 03/26/16  Yes Historical Provider, MD  fluticasone (VERAMYST) 27.5 MCG/SPRAY nasal spray Place 2 sprays into the nose 2 (two) times daily as needed for rhinitis or allergies.    Yes Historical Provider, MD  ibuprofen (ADVIL,MOTRIN) 200 MG tablet Take 200 mg by mouth every 6 (six) hours as needed for headache.   Yes Historical Provider, MD  ipratropium-albuterol (DUONEB)  0.5-2.5 (3) MG/3ML SOLN Take 3 mLs by nebulization every 6 (six) hours as needed. Patient taking differently: Take 3 mLs by nebulization every 6 (six) hours as needed (for shortness of breath).  12/07/13  Yes Hosie Poisson, MD  levocetirizine (XYZAL) 5 MG tablet Take 5 mg by mouth every evening.   Yes Historical Provider, MD  Prenatal Vit-Fe Fumarate-FA (PRENATAL MULTIVITAMIN) TABS tablet Take 1 tablet by mouth daily at 12 noon.   Yes Historical Provider, MD  ranitidine (ZANTAC) 300 MG tablet Take 300 mg by mouth every evening.  02/29/16  Yes Historical Provider, MD  Tiotropium Bromide Monohydrate (SPIRIVA RESPIMAT) 1.25 MCG/ACT AERS Inhale 2 puffs into the lungs daily.   Yes Historical Provider, MD    Family History Family History  Problem Relation Age of Onset  . Hypertension Mother   . Diabetes Mother   . Hypertension Father   . Hypertension Brother   . Anesthesia problems Neg Hx   . Hypotension Neg Hx   . Malignant hyperthermia Neg Hx   . Pseudochol deficiency Neg Hx     Social History Social History  Substance Use Topics  . Smoking status: Never Smoker  . Smokeless tobacco: Never Used  . Alcohol use No     Allergies   Bee venom and Prednisone   Review of Systems Review of Systems  Constitutional: Negative for fever.  HENT: Negative for sore throat.   Eyes: Negative for visual disturbance.  Respiratory: Positive for cough and shortness of breath.   Cardiovascular: Negative for chest pain and leg swelling.  Gastrointestinal: Negative for abdominal pain, blood in stool, nausea and vomiting.  Genitourinary: Negative for difficulty urinating.  Musculoskeletal: Negative for back pain and neck pain.  Skin: Negative for rash.  Neurological: Negative for syncope and headaches.     Physical Exam Updated Vital Signs BP 140/90   Pulse (!) 127   Temp 98.5 F (36.9 C) (Oral)   Resp 15   Ht 5\' 2"  (1.575 m)   Wt 288 lb 5.8 oz (130.8 kg)   LMP 09/10/2016 (Exact Date)    SpO2 100%   BMI 52.74 kg/m   Physical Exam  Constitutional: She is oriented to person, place, and time. She appears well-developed and well-nourished. No distress.  1-2 word dyspnea  HENT:  Head: Normocephalic and atraumatic.  Eyes: Conjunctivae and EOM are normal.  Neck: Normal range of motion.  Cardiovascular: Regular rhythm, normal heart sounds and intact distal pulses.  Tachycardia present.  Exam reveals no gallop and no friction rub.   No murmur heard. Pulmonary/Chest: Breath sounds normal. Tachypnea noted. She is in respiratory distress. She has no wheezes. She has no rales.  Abdominal: Soft. She exhibits no distension. There is no tenderness. There is no guarding.  Musculoskeletal: She exhibits no edema or tenderness.  Neurological: She is alert and oriented to person, place, and time.  Skin: Skin is warm and dry. No  rash noted. She is not diaphoretic. No erythema.  Nursing note and vitals reviewed.    ED Treatments / Results  Labs (all labs ordered are listed, but only abnormal results are displayed) Labs Reviewed  BASIC METABOLIC PANEL - Abnormal; Notable for the following:       Result Value   Potassium 3.0 (*)    Glucose, Bld 132 (*)    Calcium 8.4 (*)    All other components within normal limits  CBC - Abnormal; Notable for the following:    WBC 14.4 (*)    Hemoglobin 6.3 (*)    HCT 24.1 (*)    MCV 52.4 (*)    MCH 13.7 (*)    MCHC 26.1 (*)    RDW 22.4 (*)    All other components within normal limits  MRSA PCR SCREENING  BASIC METABOLIC PANEL  FERRITIN  FOLATE  HIV ANTIBODY (ROUTINE TESTING)  IRON AND TIBC  RETICULOCYTES  VITAMIN B12  CBC WITH DIFFERENTIAL/PLATELET  PREGNANCY, URINE  TYPE AND SCREEN  PREPARE RBC (CROSSMATCH)    EKG  EKG Interpretation None       Radiology Dg Chest Portable 1 View  Result Date: 10/11/2016 CLINICAL DATA:  Shortness of breath. EXAM: PORTABLE CHEST 1 VIEW COMPARISON:  05/26/2016 FINDINGS: The cardiac silhouette  appears slightly enlarged. There is mild central airway thickening. No confluent airspace opacity, overt pulmonary edema, pleural effusion, or pneumothorax is identified. No acute osseous abnormality is seen. IMPRESSION: Airway thickening which may reflect reactive airways disease or bronchitis. Electronically Signed   By: Logan Bores M.D.   On: 10/11/2016 21:08    Procedures Procedures (including critical care time)  Medications Ordered in ED Medications  albuterol (PROVENTIL,VENTOLIN) solution continuous neb (0 mg/hr Nebulization Stopped 10/11/16 2240)  loratadine (CLARITIN) tablet 10 mg (not administered)  tiotropium (SPIRIVA) inhalation capsule 18 mcg (not administered)  famotidine (PEPCID) tablet 40 mg (40 mg Oral Given 10/12/16 0100)  prenatal vitamin w/FE, FA (PRENATAL 1 + 1) 27-1 MG tablet 1 tablet (not administered)  mometasone-formoterol (DULERA) 200-5 MCG/ACT inhaler 2 puff (not administered)  fluticasone (FLONASE) 50 MCG/ACT nasal spray 1 spray (not administered)  sodium chloride flush (NS) 0.9 % injection 3 mL (3 mLs Intravenous Not Given 10/11/16 2330)  sodium chloride flush (NS) 0.9 % injection 3 mL (3 mLs Intravenous Given 10/12/16 0101)  sodium chloride flush (NS) 0.9 % injection 3 mL (not administered)  0.9 %  sodium chloride infusion (not administered)  acetaminophen (TYLENOL) tablet 650 mg (not administered)    Or  acetaminophen (TYLENOL) suppository 650 mg (not administered)  HYDROcodone-acetaminophen (NORCO/VICODIN) 5-325 MG per tablet 1-2 tablet (not administered)  ondansetron (ZOFRAN) tablet 4 mg (not administered)    Or  ondansetron (ZOFRAN) injection 4 mg (not administered)  albuterol (PROVENTIL) (2.5 MG/3ML) 0.083% nebulizer solution 2.5 mg (not administered)  methylPREDNISolone sodium succinate (SOLU-MEDROL) 125 mg/2 mL injection 60 mg (60 mg Intravenous Given 10/12/16 0101)  albuterol (PROVENTIL) (2.5 MG/3ML) 0.083% nebulizer solution 5 mg (5 mg Nebulization Given  10/11/16 2037)  ipratropium (ATROVENT) nebulizer solution 0.5 mg (0.5 mg Nebulization Given 10/11/16 2037)  magnesium sulfate IVPB 2 g 50 mL (0 g Intravenous Stopped 10/11/16 2208)  0.9 %  sodium chloride infusion ( Intravenous New Bag/Given 10/11/16 2251)  potassium chloride SA (K-DUR,KLOR-CON) CR tablet 40 mEq (40 mEq Oral Given 10/12/16 0100)   CRITICAL CARE: anemia, tachycardia, pRBC given Performed by: Alvino Chapel   Total critical care time: 30 minutes  Critical care  time was exclusive of separately billable procedures and treating other patients.  Critical care was necessary to treat or prevent imminent or life-threatening deterioration.  Critical care was time spent personally by me on the following activities: development of treatment plan with patient and/or surrogate as well as nursing, discussions with consultants, evaluation of patient's response to treatment, examination of patient, obtaining history from patient or surrogate, ordering and performing treatments and interventions, ordering and review of laboratory studies, ordering and review of radiographic studies, pulse oximetry and re-evaluation of patient's condition.   Initial Impression / Assessment and Plan / ED Course  I have reviewed the triage vital signs and the nursing notes.  Pertinent labs & imaging results that were available during my care of the patient were reviewed by me and considered in my medical decision making (see chart for details).     41 year old female with a history of asthma, anemia, menorrhagia presents with concern for dyspnea, cough and wheezing. Patient received Solu-Medrol and albuterol with EMS for concern of asthma exacerbation. Symptoms on arrival are consistent with asthma exacerbation, with 1-2 word dyspnea, diffuse wheezing on exam. Given tachypnea work of breathing, she was placed on continuous albuterol and given another dose of Atrovent. She was also given magnesium.  She had  improvement with these treatments, however requires close monitoring.   CXR shows no pneumonia. Hemoglobin returned at 6.3. Patient denies any acute symptoms of GI bleeding, or other acute bleeding, however does note a history of menorrhagia, prior blood transfusion. Discussed risks of blood transfusion, and gave patient one unit of PRBCs. We'll admit for continued observation in setting of symptomatic anemia as well as asthma exacerbation.  Final Clinical Impressions(s) / ED Diagnoses   Final diagnoses:  Severe persistent asthma with exacerbation  Symptomatic anemia    New Prescriptions Current Discharge Medication List       Gareth Morgan, MD 10/12/16 [phone]

## 2016-10-13 LAB — COMPREHENSIVE METABOLIC PANEL
ALT: 14 U/L (ref 14–54)
AST: 24 U/L (ref 15–41)
Albumin: 3.1 g/dL — ABNORMAL LOW (ref 3.5–5.0)
Alkaline Phosphatase: 68 U/L (ref 38–126)
Anion gap: 7 (ref 5–15)
BILIRUBIN TOTAL: 0.2 mg/dL — AB (ref 0.3–1.2)
BUN: 9 mg/dL (ref 6–20)
CHLORIDE: 108 mmol/L (ref 101–111)
CO2: 22 mmol/L (ref 22–32)
CREATININE: 0.74 mg/dL (ref 0.44–1.00)
Calcium: 8.7 mg/dL — ABNORMAL LOW (ref 8.9–10.3)
GFR calc Af Amer: >60 mL/min (ref >60)
Glucose, Bld: 135 mg/dL — ABNORMAL HIGH (ref 65–99)
POTASSIUM: 3.9 mmol/L (ref 3.5–5.1)
Sodium: 137 mmol/L (ref 135–145)
Total Protein: 7.1 g/dL (ref 6.5–8.1)

## 2016-10-13 LAB — CBC
HCT: 26.7 % — ABNORMAL LOW (ref 36.0–46.0)
Hemoglobin: 7.3 g/dL — ABNORMAL LOW (ref 12.0–15.0)
MCH: 15.2 pg — AB (ref 26.0–34.0)
MCHC: 27.3 g/dL — ABNORMAL LOW (ref 30.0–36.0)
MCV: 55.6 fL — AB (ref 78.0–100.0)
PLATELETS: 382 10*3/uL (ref 150–400)
RBC: 4.8 MIL/uL (ref 3.87–5.11)
RDW: 26 % — AB (ref 11.5–15.5)
WBC: 30.4 10*3/uL — ABNORMAL HIGH (ref 4.0–10.5)

## 2016-10-13 LAB — HEMOGLOBIN A1C
Hgb A1c MFr Bld: 5.6 % (ref 4.8–5.6)
MEAN PLASMA GLUCOSE: 114 mg/dL

## 2016-10-13 LAB — GLUCOSE, CAPILLARY: GLUCOSE-CAPILLARY: 130 mg/dL — AB (ref 65–99)

## 2016-10-13 MED ORDER — PANTOPRAZOLE SODIUM 40 MG PO TBEC
40.0000 mg | DELAYED_RELEASE_TABLET | Freq: Every day | ORAL | Status: DC
  Administered 2016-10-13 – 2016-10-15 (×3): 40 mg via ORAL
  Filled 2016-10-13 (×3): qty 1

## 2016-10-13 MED ORDER — ZOLPIDEM TARTRATE 5 MG PO TABS: 5.0000 mg | ORAL_TABLET | Freq: Every evening | ORAL | Status: DC | PRN

## 2016-10-13 MED ORDER — METHYLPREDNISOLONE SODIUM SUCC 40 MG IJ SOLR
40.0000 mg | Freq: Three times a day (TID) | INTRAMUSCULAR | Status: DC
  Administered 2016-10-13 – 2016-10-14 (×5): 40 mg via INTRAVENOUS
  Filled 2016-10-13 (×5): qty 1

## 2016-10-13 NOTE — Progress Notes (Signed)
Report given to Estill Bamberg, RN on [facility]. Patient will be transferred via wheelchair to 5C07. CCMD notified that patient will no longer be on telemetry. Patient made her family aware. Belongings sent with patient include her purse, cell phone, charger, glasses. SCD machine sent with patient.   Milford Cage, RN

## 2016-10-13 NOTE — Progress Notes (Signed)
PROGRESS NOTE    [name]  OZH:[id number]  DOB: [birth year]  DOA: 10/11/2016 PCP: Philis Fendt, MD Outpatient Specialists: Pulmologist: Jeanella Cara ([facility])   Brief Admission Hx: 41 year old female asthmatic presented to ED by EMS with severe asthma exacerbation, admitted to SDU (see H&P)  Assessment & Plan:   1. Asthma with acute exacerbation / Moderate Persistent Asthma  - Pt has moderate persistent asthma managed at home with Symbicort, Spiriva, and prn albuterol MDI; she has never been intubated for this; she reports using albuterol MDI on daily basis at baseline. She has a pulmonologist in [facility].  - Presented in acute exacerbation, likely secondary to environmental triggers, no evidence for infection  - continue systemic steroid, scheduled duonebs and also symbicort  -Weaning steroids 4/3 as she is starting to improve.   2. 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   - very low iron levels noted. - Continue prenatal vitamins daily, probably should take with a Vit C tablet to enhance iron absorption.   3. Hypokalemia  - Serum potassium is 3.0 on admission with non-specific repol abnormality on EKG  - Likely secondary to albuterol and intracellular shifts  - She had been given 2 g IV magnesium en route with EMS and 40 mEq oral potassium in ED  - Repleted  DVT prophylaxis: SCDs Code Status: Full  Family Communication: Discussed with patient Disposition Plan: Admit to SDU Consults called: None Admission status: Inpatient Transfer to [facility] 4/3   Subjective: Pt still very short of breath with ambulation but does report noticeable  improvement  Objective: Vitals:   10/13/16 0600 10/13/16 0745 10/13/16 0830 10/13/16 0832  BP:  (!) 146/105    Pulse: 99     Resp: (!) 33     Temp:  97.7 F (36.5 C)    TempSrc:  Oral    SpO2: 99%  99% 97%  Weight:      Height:        Intake/Output Summary (Last 24 hours) at 10/13/16 0836 Last data filed at 10/13/16 0400  Gross per 24 hour  Intake              743 ml  Output             1700 ml  Net             -957 ml   Filed Weights   10/11/16 2026 10/12/16 0024  Weight: 131.1 kg (289 lb) 130.8 kg (288 lb 5.8 oz)    Exam:  General exam: awake, alert, NAD. Cooperative.  Respiratory system: tachypneic. Diffuse exp wheezes heard mostly heard on right but improved from yesterday.   Cardiovascular system: S1 & S2 heard, tachycardic. No JVD, murmurs, gallops, clicks or pedal edema. Gastrointestinal system: Abdomen is nondistended, soft and nontender. Normal bowel sounds heard. Central nervous system: Alert and oriented. No focal neurological deficits. Extremities: no cyanosis.   Data Reviewed: Basic Metabolic Panel:  Recent Labs Lab 10/11/16 2051 10/12/16 0355 10/13/16 0151  NA 137 136 137  K 3.0* 2.9* 3.9  CL 103 105 108  CO2 24 15* 22  GLUCOSE 132* 216* 135*  BUN 7 8 9   CREATININE 0.74  0.92 0.74  CALCIUM 8.4* 8.4* 8.7*   Liver Function Tests:  Recent Labs Lab 10/13/16 0151  AST 24  ALT 14  ALKPHOS 68  BILITOT 0.2*  PROT 7.1  ALBUMIN 3.1*   No results for input(s): LIPASE, AMYLASE in the last 168 hours. No results for input(s): AMMONIA in the last 168 hours. CBC:  Recent Labs Lab 10/11/16 2051 10/12/16 0355 10/13/16 0151  WBC 14.4* 16.2* 30.4*  NEUTROABS  --  15.0*  --   HGB 6.3* 7.2* 7.3*  HCT 24.1* 26.8* 26.7*  MCV 52.4* 55.4* 55.6*  PLT 389 387 382   Cardiac Enzymes: No results for input(s): CKTOTAL, CKMB, CKMBINDEX, TROPONINI in the last 168 hours. CBG (last 3)   Recent Labs  10/12/16 1652 10/12/16 2223 10/13/16 0745  GLUCAP  133* 132* 130*   Recent Results (from the past 240 hour(s))  MRSA PCR Screening     Status: Abnormal   Collection Time: 10/12/16 12:47 AM  Result Value Ref Range Status   MRSA by PCR POSITIVE (A) NEGATIVE Final    Comment:        The GeneXpert MRSA Assay (FDA approved for NASAL specimens only), is one component of a comprehensive MRSA colonization surveillance program. It is not intended to diagnose MRSA infection nor to guide or monitor treatment for MRSA infections. RESULT CALLED TO, READ BACK BY AND VERIFIED WITH: RN Jerrye Bushy [id number] @[id number]  THANEY      Studies: Dg Chest Portable 1 View  Result Date: 10/11/2016 CLINICAL DATA:  Shortness of breath. EXAM: PORTABLE CHEST 1 VIEW COMPARISON:  05/26/2016 FINDINGS: The cardiac silhouette appears slightly enlarged. There is mild central airway thickening. No confluent airspace opacity, overt pulmonary edema, pleural effusion, or pneumothorax is identified. No acute osseous abnormality is seen. IMPRESSION: Airway thickening which may reflect reactive airways disease or bronchitis. Electronically Signed   By: Logan Bores M.D.   On: 10/11/2016 21:08     Scheduled Meds: . Chlorhexidine Gluconate Cloth  6 each Topical Q0600  . fluticasone  1 spray Each Nare Daily  . ipratropium-albuterol  3 mL Nebulization TID  . loratadine  10 mg Oral QPM  . methylPREDNISolone (SOLU-MEDROL) injection  40 mg Intravenous TID  . mometasone-formoterol  2 puff Inhalation BID  . mupirocin ointment  1 application Nasal BID  . pantoprazole  40 mg Oral Q0600  . prenatal vitamin w/FE, FA  1 tablet Oral Q1200  . sodium chloride flush  3 mL Intravenous Q12H  . 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

## 2016-10-13 NOTE — Progress Notes (Signed)
Morbid Obesity with BMI of 52.74

## 2016-10-14 DIAGNOSIS — D649 Anemia, unspecified: Secondary | ICD-10-CM

## 2016-10-14 LAB — CBC WITH DIFFERENTIAL/PLATELET
BASOS PCT: 0 %
Basophils Absolute: 0 10*3/uL (ref 0.0–0.1)
Eosinophils Absolute: 0 10*3/uL (ref 0.0–0.7)
Eosinophils Relative: 0 %
HCT: 27.9 % — ABNORMAL LOW (ref 36.0–46.0)
HEMOGLOBIN: 7.7 g/dL — AB (ref 12.0–15.0)
LYMPHS PCT: 8 %
Lymphs Abs: 2.3 10*3/uL (ref 0.7–4.0)
MCH: 15.2 pg — AB (ref 26.0–34.0)
MCHC: 27.6 g/dL — ABNORMAL LOW (ref 30.0–36.0)
MCV: 55.1 fL — AB (ref 78.0–100.0)
MONOS PCT: 5 %
Monocytes Absolute: 1.4 10*3/uL — ABNORMAL HIGH (ref 0.1–1.0)
NEUTROS ABS: 24.8 10*3/uL — AB (ref 1.7–7.7)
Neutrophils Relative %: 87 %
Platelets: 381 10*3/uL (ref 150–400)
RBC: 5.06 MIL/uL (ref 3.87–5.11)
RDW: 26 % — ABNORMAL HIGH (ref 11.5–15.5)
WBC: 28.5 10*3/uL — ABNORMAL HIGH (ref 4.0–10.5)

## 2016-10-14 LAB — BASIC METABOLIC PANEL
Anion gap: 8 (ref 5–15)
BUN: 12 mg/dL (ref 6–20)
CHLORIDE: 106 mmol/L (ref 101–111)
CO2: 23 mmol/L (ref 22–32)
Calcium: 9.3 mg/dL (ref 8.9–10.3)
Creatinine, Ser: 0.77 mg/dL (ref 0.44–1.00)
GFR calc Af Amer: >60 mL/min (ref >60)
GFR calc non Af Amer: >60 mL/min (ref >60)
GLUCOSE: 129 mg/dL — AB (ref 65–99)
POTASSIUM: 4.2 mmol/L (ref 3.5–5.1)
SODIUM: 137 mmol/L (ref 135–145)

## 2016-10-14 LAB — MAGNESIUM: Magnesium: 2.7 mg/dL — ABNORMAL HIGH (ref 1.7–2.4)

## 2016-10-14 MED ORDER — SODIUM CHLORIDE 0.9 % IV SOLN
510.0000 mg | Freq: Once | INTRAVENOUS | Status: AC
  Administered 2016-10-14: 510 mg via INTRAVENOUS
  Filled 2016-10-14: qty 17

## 2016-10-14 MED ORDER — ENOXAPARIN SODIUM 40 MG/0.4ML ~~LOC~~ SOLN: 40.0000 mg | SUBCUTANEOUS | Status: DC

## 2016-10-14 MED ORDER — AMLODIPINE BESYLATE 10 MG PO TABS
10.0000 mg | ORAL_TABLET | Freq: Every day | ORAL | Status: DC
  Administered 2016-10-14 – 2016-10-15 (×2): 10 mg via ORAL
  Filled 2016-10-14 (×2): qty 1

## 2016-10-14 NOTE — Progress Notes (Addendum)
PROGRESS NOTE    [name]  SHF:[id number] DOB: 02-Sep-1975 DOA: 10/11/2016 PCP: Philis Fendt, MD    Brief Narrative:  41 year old female asthmatic presented to ED by EMS with severe asthma exacerbation, admitted to SDU (see H&P)    Assessment & Plan:   Principal Problem:   Asthma exacerbation Active Problems:   Anemia due to menorrhagia and fibroids   Hypokalemia  #1 acute asthma exacerbation Questionable etiology. May have been triggered by recent weather changes and pollen. Chest x-ray negative for any acute infiltrate. Patient afebrile. Patient with a leukocytosis however likely steroid-induced. Patient with some clinical improvement. Continue current regimen of IV Solu-Medrol and transitioned to oral steriods in the morning. Continue Dulera, scheduled nebulizers, Flonase, Claritin, PPI. Outpatient follow-up.  #2 severe iron deficiency anemia due to menorrhagia and fibroids Patient currently having her menses. Patient status post 1 unit packed red blood cells. Hemoglobin currently at 7.7 from 6.3. IV iron 1. Patient stated was supposed to start oral iron supplementation which she hasn't started yet and will start on discharge.  #3 hypokalemia Repleted.  #4 morbid obesity    DVT prophylaxis: SCDs Code Status: Full Family Communication: Updated patient. No family at bedside. Disposition Plan: Home when improved clinically, hopefully tomorrow.   Consultants:   None  Procedures:   CXR 10/11/2016  1 unit PRB blood cells 3 2018  Antimicrobials:   None.   Subjective: Patient states some improvement with wheezing. Feeling better. Patient states currently having her menses.  Objective: Vitals:   10/13/16 2134 10/14/16 0100 10/14/16 0440 10/14/16 0930  BP: (!) 169/106 (!) 147/86 (!) 158/102 (!) 150/97  Pulse: 98 95 76 86  Resp: 20 20 18    Temp: 97.9 F (36.6 C) 98.4 F (36.9 C) 98.2 F (36.8 C) 97.8 F (36.6 C)  TempSrc: Oral Oral Oral Oral  SpO2:  100% 99% 99% 100%  Weight: 131.6 kg (290 lb 2 oz)     Height: 5\' 2"  (1.575 m)       Intake/Output Summary (Last 24 hours) at 10/14/16 1232 Last data filed at 10/13/16 2100  Gross per 24 hour  Intake                3 ml  Output                0 ml  Net                3 ml   Filed Weights   10/11/16 2026 10/12/16 0024 10/13/16 2134  Weight: 131.1 kg (289 lb) 130.8 kg (288 lb 5.8 oz) 131.6 kg (290 lb 2 oz)    Examination:  General exam: Appears calm and comfortable.  Respiratory system: Minimal expiratory wheezes. Respiratory effort normal. Cardiovascular system: S1 & S2 heard, RRR. No JVD, murmurs, rubs, gallops or clicks. No pedal edema. Gastrointestinal system: Abdomen is nondistended, soft and nontender. No organomegaly or masses felt. Normal bowel sounds heard. Central nervous system: Alert and oriented. No focal neurological deficits. Extremities: Symmetric 5 x 5 power. Skin: No rashes, lesions or ulcers Psychiatry: Judgement and insight appear normal. Mood & affect appropriate.     Data Reviewed: I have personally reviewed following labs and imaging studies  CBC:  Recent Labs Lab 10/11/16 2051 10/12/16 0355 10/13/16 0151 10/14/16 1030  WBC 14.4* 16.2* 30.4* 28.5*  NEUTROABS  --  15.0*  --  24.8*  HGB 6.3* 7.2* 7.3* 7.7*  HCT 24.1* 26.8* 26.7* 27.9*  MCV  52.4* 55.4* 55.6* 55.1*  PLT 389 387 382 242   Basic Metabolic Panel:  Recent Labs Lab 10/11/16 2051 10/12/16 0355 10/13/16 0151 10/14/16 0905  NA 137 136 137 137  K 3.0* 2.9* 3.9 4.2  CL 103 105 108 106  CO2 24 15* 22 23  GLUCOSE 132* 216* 135* 129*  BUN 7 8 9 12   CREATININE 0.74 0.92 0.74 0.77  CALCIUM 8.4* 8.4* 8.7* 9.3  MG  --   --   --  2.7*   GFR: Estimated Creatinine Clearance: 120.8 mL/min (by C-G formula based on SCr of 0.77 mg/dL). Liver Function Tests:  Recent Labs Lab 10/13/16 0151  AST 24  ALT 14  ALKPHOS 68  BILITOT 0.2*  PROT 7.1  ALBUMIN 3.1*   No results for input(s):  LIPASE, AMYLASE in the last 168 hours. No results for input(s): AMMONIA in the last 168 hours. Coagulation Profile: No results for input(s): INR, PROTIME in the last 168 hours. Cardiac Enzymes: No results for input(s): CKTOTAL, CKMB, CKMBINDEX, TROPONINI in the last 168 hours. BNP (last 3 results) No results for input(s): PROBNP in the last 8760 hours. HbA1C:  Recent Labs  10/12/16 0355  HGBA1C 5.6   CBG:  Recent Labs Lab 10/12/16 0822 10/12/16 1244 10/12/16 1652 10/12/16 2223 10/13/16 0745  GLUCAP 170* 117* 133* 132* 130*   Lipid Profile: No results for input(s): CHOL, HDL, LDLCALC, TRIG, CHOLHDL, LDLDIRECT in the last 72 hours. Thyroid Function Tests: No results for input(s): TSH, T4TOTAL, FREET4, T3FREE, THYROIDAB in the last 72 hours. Anemia Panel:  Recent Labs  10/12/16 0355  VITAMINB12 556  FOLATE 12.7  FERRITIN 3*  TIBC 462*  IRON 9*  RETICCTPCT 1.1   Sepsis Labs: No results for input(s): PROCALCITON, LATICACIDVEN in the last 168 hours.  Recent Results (from the past 240 hour(s))  MRSA PCR Screening     Status: Abnormal   Collection Time: 10/12/16 12:47 AM  Result Value Ref Range Status   MRSA by PCR POSITIVE (A) NEGATIVE Final    Comment:        The GeneXpert MRSA Assay (FDA approved for NASAL specimens only), is one component of a comprehensive MRSA colonization surveillance program. It is not intended to diagnose MRSA infection nor to guide or monitor treatment for MRSA infections. RESULT CALLED TO, READ BACK BY AND VERIFIED WITH: RN Jerrye Bushy [id number] @0312  [facility]          Radiology Studies: No results found.      Scheduled Meds: . Chlorhexidine Gluconate Cloth  6 each Topical Q0600  . fluticasone  1 spray Each Nare Daily  . ipratropium-albuterol  3 mL Nebulization TID  . loratadine  10 mg Oral QPM  . methylPREDNISolone (SOLU-MEDROL) injection  40 mg Intravenous TID  . mometasone-formoterol  2 puff Inhalation BID  .  mupirocin ointment  1 application Nasal BID  . pantoprazole  40 mg Oral Q0600  . prenatal vitamin w/FE, FA  1 tablet Oral Q1200  . sodium chloride flush  3 mL Intravenous Q12H  . sodium chloride flush  3 mL Intravenous Q12H   Continuous Infusions:   LOS: 3 days    Time spent: 11 mins    THOMPSON,DANIEL, MD [facility] Pager [phone]  If 7PM-7AM, please contact night-coverage [email or web address] Password [facility] 10/14/2016, 12:32 PM

## 2016-10-15 DIAGNOSIS — D72829 Elevated white blood cell count, unspecified: Secondary | ICD-10-CM

## 2016-10-15 DIAGNOSIS — D500 Iron deficiency anemia secondary to blood loss (chronic): Secondary | ICD-10-CM

## 2016-10-15 DIAGNOSIS — I10 Essential (primary) hypertension: Secondary | ICD-10-CM

## 2016-10-15 LAB — CBC WITH DIFFERENTIAL/PLATELET
BASOS ABS: 0.3 10*3/uL — AB (ref 0.0–0.1)
Basophils Relative: 1 %
EOS PCT: 0 %
Eosinophils Absolute: 0 10*3/uL (ref 0.0–0.7)
HCT: 29.6 % — ABNORMAL LOW (ref 36.0–46.0)
HEMOGLOBIN: 8 g/dL — AB (ref 12.0–15.0)
LYMPHS ABS: 2.7 10*3/uL (ref 0.7–4.0)
Lymphocytes Relative: 9 %
MCH: 15 pg — AB (ref 26.0–34.0)
MCHC: 27 g/dL — AB (ref 30.0–36.0)
MCV: 55.6 fL — AB (ref 78.0–100.0)
MONO ABS: 1.8 10*3/uL — AB (ref 0.1–1.0)
MONOS PCT: 6 %
NEUTROS ABS: 25.1 10*3/uL — AB (ref 1.7–7.7)
NEUTROS PCT: 84 %
PLATELETS: 398 10*3/uL (ref 150–400)
RBC: 5.32 MIL/uL — AB (ref 3.87–5.11)
RDW: 26.1 % — ABNORMAL HIGH (ref 11.5–15.5)
WBC: 29.9 10*3/uL — AB (ref 4.0–10.5)

## 2016-10-15 LAB — BASIC METABOLIC PANEL
ANION GAP: 9 (ref 5–15)
BUN: 13 mg/dL (ref 6–20)
CHLORIDE: 104 mmol/L (ref 101–111)
CO2: 23 mmol/L (ref 22–32)
Calcium: 8.8 mg/dL — ABNORMAL LOW (ref 8.9–10.3)
Creatinine, Ser: 0.78 mg/dL (ref 0.44–1.00)
GFR calc non Af Amer: >60 mL/min (ref >60)
Glucose, Bld: 144 mg/dL — ABNORMAL HIGH (ref 65–99)
POTASSIUM: 4.1 mmol/L (ref 3.5–5.1)
Sodium: 136 mmol/L (ref 135–145)

## 2016-10-15 MED ORDER — AMLODIPINE BESYLATE 10 MG PO TABS: 10.0000 mg | ORAL_TABLET | Freq: Every day | ORAL | 1 refills | Status: DC

## 2016-10-15 MED ORDER — TRAMADOL HCL 50 MG PO TABS: 100.0000 mg | ORAL_TABLET | Freq: Four times a day (QID) | ORAL | 0 refills | Status: DC | PRN

## 2016-10-15 MED ORDER — METHYLPREDNISOLONE 4 MG PO TBPK: 4.0000 mg | ORAL_TABLET | Freq: Three times a day (TID) | ORAL | Status: DC

## 2016-10-15 MED ORDER — METHYLPREDNISOLONE 4 MG PO TBPK: 4.0000 mg | ORAL_TABLET | Freq: Four times a day (QID) | ORAL | Status: DC

## 2016-10-15 MED ORDER — METHYLPREDNISOLONE 4 MG PO TBPK: ORAL_TABLET | ORAL | 0 refills | Status: DC

## 2016-10-15 MED ORDER — METHYLPREDNISOLONE 4 MG PO TBPK: 4.0000 mg | ORAL_TABLET | ORAL | Status: DC

## 2016-10-15 MED ORDER — METHYLPREDNISOLONE 4 MG PO TBPK: 8.0000 mg | ORAL_TABLET | Freq: Every evening | ORAL | Status: DC

## 2016-10-15 MED ORDER — METHYLPREDNISOLONE 4 MG PO TBPK
8.0000 mg | ORAL_TABLET | Freq: Once | ORAL | Status: AC
  Administered 2016-10-15: 8 mg via ORAL
  Filled 2016-10-15: qty 21

## 2016-10-15 MED ORDER — PANTOPRAZOLE SODIUM 40 MG PO TBEC: 40.0000 mg | DELAYED_RELEASE_TABLET | Freq: Every day | ORAL | 1 refills | Status: DC

## 2016-10-15 MED ORDER — METHYLPREDNISOLONE 4 MG PO TBPK
4.0000 mg | ORAL_TABLET | ORAL | Status: AC
  Administered 2016-10-15: 4 mg via ORAL

## 2016-10-15 NOTE — Care Management Note (Signed)
Case Management Note  Patient Details  Name: RYLANN MUNFORD MRN: [id number] Date of Birth: 05/02/1976  Subjective/Objective:  Patient discharged home, no needs.                  Action/Plan:   Expected Discharge Date:  10/15/16               Expected Discharge Plan:  Home/Self Care  In-House Referral:     Discharge planning Services  CM Consult  Post Acute Care Choice:    Choice offered to:     DME Arranged:    DME Agency:     HH Arranged:    HH Agency:     Status of Service:  Completed, signed off  If discussed at [employer] of Stay Meetings, dates discussed:    Additional Comments:  Zenon Mayo, RN 10/15/2016, 5:18 PM

## 2016-10-15 NOTE — Discharge Summary (Signed)
Physician Discharge Summary  [name] XBJ:[id number] DOB: [birth year] DOA: 10/11/2016  PCP: Philis Fendt, MD  Admit date: 10/11/2016 Discharge date: 10/15/2016  Time spent: 65 minutes  Recommendations for Outpatient Follow-up:  1. Follow-up with pulmonologist in [facility] in 1 week for follow-up on patient's asthma exacerbation. 2. Follow-up with Philis Fendt, MD in 2 weeks. On follow-up patient will need a CBC done to follow-up on her leukocytosis and a basic metabolic profile done to follow-up on electrolytes and renal function. Patient will also need further management of her hypertension as patient was started on Norvasc 10 mg daily during the hospitalization.   Discharge Diagnoses:  Principal Problem:   Asthma exacerbation Active Problems:   Symptomatic anemia   Essential hypertension, benign   Anemia due to menorrhagia and fibroids   Hypokalemia   Leukocytosis   Discharge Condition: Stable and improved  Diet recommendation: Regular  Filed Weights   10/11/16 2026 10/12/16 0024 10/13/16 2134  Weight: 131.1 kg (289 lb) 130.8 kg (288 lb 5.8 oz) 131.6 kg (290 lb 2 oz)    History of present illness:  Per Dr Kirt Boys is a 41 y.o. female with medical history significant for persistent asthma, menorrhagia and fibroids with microcytic anemia, and GERD who presented to the emergency department with acute onset of dyspnea and wheezing. Patient reported that she been in her usual state of health and was having an uneventful day when she went to visit her brother at his home this evening. She reported that it was very warm and stuffy inside of his house, noting that this setting frequently exacerbates her asthma and she keeps her home cool and with fans on to avoid this. She began to experience dyspnea with expiratory wheezing at approximately 9 PM the evening of admission, and her condition progressively worsened since that time. She reported walking out to her car for  her inhaler, but being so short of breath that she had to ask a stranger outside for help. EMS was called to help and the patient was treated with nebs, 2 g of IV magnesium, and 125 mg IV Solu-Medrol and route to the hospital. Patient denied any recent fevers or chills, denied lower extremity tenderness or swelling, and denied orthopnea, chest pain, or palpitations. She reported her symptoms were consistent with prior asthma exacerbations, but noted that this was the most severe that she has ever had. She has not been on antibiotics or steroids recently. Patient noted heavy periods and history of fibroids. She was not currently menstruating. She denied any abdominal pain, nausea, diarrhea, melena, or hematochezia. There has been no hematuria noted.   ED Course: Upon arrival to the ED, patient is found to be afebrile, saturating adequately on room air, tachypneic, tachycardic and 120s, and with stable blood pressure. Patient was in acute respiratory distress on arrival. EKG featured a sinus tachycardia with rate 123 and diffuse nonspecific repolarization abnormalities. Chest x-ray was notable for airway thickening consistent with reactive airways disease or bronchitis. Chemistry panel features a potassium of 3.0 and CBC is notable for hemoglobin of 6.3 with MCV of 52.4. Hemoglobin had been 7.7 six months ago. She was treated with nebs in the ED and transfused with 1 unit of packed red blood cells. She had shown improvement in her respiratory status and remained hemodynamically stable, but continued to have labored breathing while at rest and will be admitted to the stepdown unit for ongoing evaluation and management of asthma with  acute exacerbation and acute on chronic microcytic anemia with hemoglobin of 6.3.   Hospital Course:  #1 acute asthma exacerbation Questionable etiology. May have been triggered by recent weather changes and pollen as well as when patient had gone to visit her brother as it was  noted on admission that the home was very stuffy. Chest x-ray negative for any acute infiltrate. Patient remained afebrile. Patient was placed on IV Solu-Medrol, scheduled nebulizers, Flonase, Claritin, PPI, dulera with clinical improvement. Patient was subsequently transitioned to oral Medrol dose pack which she was discharged on. Patient is to follow-up with her pulmonologist in [facility] one week post discharge.   #2 severe iron deficiency anemia due to menorrhagia and fibroids Patient noted on admission to be anemic with a hemoglobin of 6.3. During the hospitalization patient was noted to have started on her menses. Anemia panel obtained was consistent with a severe iron deficiency anemia with iron level of 9, ferritin of 3, TIBC of 462, folate of 12.7. Patient was transfused 1 unit packed red blood cells and also received IV iron during the hospitalization. Patient's hemoglobin improved and stabilized at 8.0 from 6.3 on admission. Patient stated was recently started on iron supplementation which she will continue on discharge. Outpatient follow-up.  #3 hypokalemia Repleted.  #4 morbid obesity  #5 leukocytosis During the hospital physician patient noted to have a leukocytosis which is felt to likely be steroid-induced. Chest x-ray done was negative for any acute infiltrate. Urinalysis was unremarkable. Patient remained afebrile. Patient was on IV steroids during the hospitalization and transitioned to Medrol Dosepak on discharge. Patient will need follow-up CBC done with PCP.  #6 hypertension During also lies edition patient was noted to be hypertensive with systolic blood pressure in the 170s. Patient was started on Norvasc 10 mg daily with improvement with systolic blood pressure into the 140s. Outpatient follow-up for further blood pressure management.  Procedures:  CXR 10/11/2016  1 unit PRB blood cells 10/13/2016   Consultations:  None  Discharge Exam: Vitals:   10/15/16 0639  10/15/16 1000  BP: (!) 159/85 (!) 148/87  Pulse: 70 82  Resp: 18 19  Temp: 97.8 F (36.6 C) 97.9 F (36.6 C)    General: NAD Cardiovascular: RRR Respiratory: CTAB  Discharge Instructions   Discharge Instructions    Diet general    Complete by:  As directed    Increase activity slowly    Complete by:  As directed      Discharge Medication List as of 10/15/2016  2:25 PM    START taking these medications   Details  amLODipine (NORVASC) 10 MG tablet Take 1 tablet (10 mg total) by mouth daily., Starting Fri 10/16/2016, Print    methylPREDNISolone (MEDROL DOSEPAK) 4 MG TBPK tablet Use as directed., Print    pantoprazole (PROTONIX) 40 MG tablet Take 1 tablet (40 mg total) by mouth daily at 6 (six) AM., Starting Fri 10/16/2016, Print    traMADol (ULTRAM) 50 MG tablet Take 2 tablets (100 mg total) by mouth every 6 (six) hours as needed., Starting Thu 10/15/2016, Print      CONTINUE these medications which have NOT CHANGED   Details  albuterol (PROVENTIL HFA;VENTOLIN HFA) 108 (90 Base) MCG/ACT inhaler Inhale 2 puffs into the lungs every 6 (six) hours as needed for wheezing or shortness of breath., Until Discontinued, Historical Med    budesonide-formoterol (SYMBICORT) 160-4.5 MCG/ACT inhaler Inhale 2 puffs into the lungs 2 (two) times daily as needed (for shortness of breath). ,  Until Discontinued, Historical Med    cetirizine (ZYRTEC) 10 MG tablet Take 10 mg by mouth daily., Until Discontinued, Historical Med    EPINEPHrine 0.3 mg/0.3 mL IJ SOAJ injection Inject 0.3 mg into the muscle once as needed for anaphylaxis. For up to 2 doses., Starting Thu 03/26/2016, Historical Med    fluticasone (VERAMYST) 27.5 MCG/SPRAY nasal spray Place 2 sprays into the nose 2 (two) times daily as needed for rhinitis or allergies. , Historical Med    ibuprofen (ADVIL,MOTRIN) 200 MG tablet Take 200 mg by mouth every 6 (six) hours as needed for headache., Until Discontinued, Historical Med     ipratropium-albuterol (DUONEB) 0.5-2.5 (3) MG/3ML SOLN Take 3 mLs by nebulization every 6 (six) hours as needed., Starting 12/07/2013, Until Discontinued, Print    levocetirizine (XYZAL) 5 MG tablet Take 5 mg by mouth every evening., Historical Med    Prenatal Vit-Fe Fumarate-FA (PRENATAL MULTIVITAMIN) TABS tablet Take 1 tablet by mouth daily at 12 noon., Until Discontinued, Historical Med    ranitidine (ZANTAC) 300 MG tablet Take 300 mg by mouth every evening. , Starting Sat 02/29/2016, Historical Med    Tiotropium Bromide Monohydrate (SPIRIVA RESPIMAT) 1.25 MCG/ACT AERS Inhale 2 puffs into the lungs daily., Historical Med       Allergies  Allergen Reactions  . Bee Venom Anaphylaxis    Patient has an epi pen, also has not been stung before but was informed she is allergic  . Prednisone Hives    Patient has been rechallenged with prednisone and experienced the same symptoms of hives and face swelling.  She can tolerate methylprednisolone IV and PO.   Follow-up Information    Philis Fendt, MD. Schedule an appointment as soon as possible for a visit in 2 week(s).   Specialty:  Internal Medicine Contact information: [address] [place] [address] [phone]        pulmonogist. Schedule an appointment as soon as possible for a visit in 1 week(s).   Why:  f/u with your pulmonary/asthma MD in 1 -2 weeks.           The results of significant diagnostics from this hospitalization (including imaging, microbiology, ancillary and laboratory) are listed below for reference.    Significant Diagnostic Studies: Dg Chest Portable 1 View  Result Date: 10/11/2016 CLINICAL DATA:  Shortness of breath. EXAM: PORTABLE CHEST 1 VIEW COMPARISON:  05/26/2016 FINDINGS: The cardiac silhouette appears slightly enlarged. There is mild central airway thickening. No confluent airspace opacity, overt pulmonary edema, pleural effusion, or pneumothorax is identified. No acute osseous abnormality  is seen. IMPRESSION: Airway thickening which may reflect reactive airways disease or bronchitis. Electronically Signed   By: Logan Bores M.D.   On: 10/11/2016 21:08    Microbiology: Recent Results (from the past 240 hour(s))  MRSA PCR Screening     Status: Abnormal   Collection Time: 10/12/16 12:47 AM  Result Value Ref Range Status   MRSA by PCR POSITIVE (A) NEGATIVE Final    Comment:        The GeneXpert MRSA Assay (FDA approved for NASAL specimens only), is one component of a comprehensive MRSA colonization surveillance program. It is not intended to diagnose MRSA infection nor to guide or monitor treatment for MRSA infections. RESULT CALLED TO, READ BACK BY AND VERIFIED WITH: RN Jerrye Bushy [id number] @0312  [facility]      Labs: Basic Metabolic Panel:  Recent Labs Lab 10/11/16 2051 10/12/16 0355 10/13/16 0151 10/14/16 0905 10/15/16 0310  NA 137  136 137 137 136  K 3.0* 2.9* 3.9 4.2 4.1  CL 103 105 108 106 104  CO2 24 15* 22 23 23   GLUCOSE 132* 216* 135* 129* 144*  BUN 7 8 9 12 13   CREATININE 0.74 0.92 0.74 0.77 0.78  CALCIUM 8.4* 8.4* 8.7* 9.3 8.8*  MG  --   --   --  2.7*  --    Liver Function Tests:  Recent Labs Lab 10/13/16 0151  AST 24  ALT 14  ALKPHOS 68  BILITOT 0.2*  PROT 7.1  ALBUMIN 3.1*   No results for input(s): LIPASE, AMYLASE in the last 168 hours. No results for input(s): AMMONIA in the last 168 hours. CBC:  Recent Labs Lab 10/11/16 2051 10/12/16 0355 10/13/16 0151 10/14/16 1030 10/15/16 0310  WBC 14.4* 16.2* 30.4* 28.5* 29.9*  NEUTROABS  --  15.0*  --  24.8* 25.1*  HGB 6.3* 7.2* 7.3* 7.7* 8.0*  HCT 24.1* 26.8* 26.7* 27.9* 29.6*  MCV 52.4* 55.4* 55.6* 55.1* 55.6*  PLT 389 387 382 381 398   Cardiac Enzymes: No results for input(s): CKTOTAL, CKMB, CKMBINDEX, TROPONINI in the last 168 hours. BNP: BNP (last 3 results) No results for input(s): BNP in the last 8760 hours.  ProBNP (last 3 results) No results for input(s): PROBNP in  the last 8760 hours.  CBG:  Recent Labs Lab 10/12/16 0822 10/12/16 1244 10/12/16 1652 10/12/16 2223 10/13/16 0745  GLUCAP 170* 117* 133* 132* 130*       Signed:  Rickeya Manus MD.  [facility] 10/15/2016, 7:10 PM

## 2016-10-15 NOTE — Care Management Important Message (Signed)
Important Message  Patient Details  Name: [name] MRN: [id number] Date of Birth: [birth year]   Medicare Important Message Given:  Yes    Carles Collet, RN 10/15/2016, 10:11 AM

## 2016-10-15 NOTE — Progress Notes (Signed)
Pt. Discharged home with home instructions. Scripts and discharged instructions given to patient. Patient stated understanding of discharged instructions given. RN informed patient the importance of follow-up appointment with PCP and pulmonologist.

## 2017-01-19 ENCOUNTER — Other Ambulatory Visit: Payer: Self-pay | Admitting: Internal Medicine

## 2017-01-19 DIAGNOSIS — Z1231 Encounter for screening mammogram for malignant neoplasm of breast: Secondary | ICD-10-CM

## 2017-01-28 ENCOUNTER — Ambulatory Visit
Admission: RE | Admit: 2017-01-28 | Discharge: 2017-01-28 | Disposition: A | Discharge status: Home / Self Care | Payer: Medicare HMO | Source: Ambulatory Visit | Attending: Internal Medicine | Admitting: Internal Medicine

## 2017-01-28 DIAGNOSIS — Z1231 Encounter for screening mammogram for malignant neoplasm of breast: Secondary | ICD-10-CM

## 2017-02-01 ENCOUNTER — Other Ambulatory Visit: Payer: Self-pay | Admitting: Internal Medicine

## 2017-02-01 DIAGNOSIS — R928 Other abnormal and inconclusive findings on diagnostic imaging of breast: Secondary | ICD-10-CM

## 2017-02-05 ENCOUNTER — Ambulatory Visit
Admission: RE | Admit: 2017-02-05 | Discharge: 2017-02-05 | Disposition: A | Discharge status: Home / Self Care | Payer: Medicare HMO | Source: Ambulatory Visit | Attending: Internal Medicine | Admitting: Internal Medicine

## 2017-02-05 ENCOUNTER — Ambulatory Visit: Payer: Medicare HMO

## 2017-02-05 DIAGNOSIS — R928 Other abnormal and inconclusive findings on diagnostic imaging of breast: Secondary | ICD-10-CM

## 2017-02-22 ENCOUNTER — Encounter: Payer: Self-pay | Admitting: Allergy and Immunology

## 2017-02-22 ENCOUNTER — Ambulatory Visit (INDEPENDENT_AMBULATORY_CARE_PROVIDER_SITE_OTHER): Payer: Medicare HMO | Admitting: Allergy and Immunology

## 2017-02-22 VITALS — BP 132/78 | HR 84 | Temp 97.9°F | Resp 19 | Ht 62.5 in | Wt 274.4 lb

## 2017-02-22 DIAGNOSIS — J330 Polyp of nasal cavity: Secondary | ICD-10-CM

## 2017-02-22 DIAGNOSIS — J4551 Severe persistent asthma with (acute) exacerbation: Secondary | ICD-10-CM | POA: Diagnosis not present

## 2017-02-22 DIAGNOSIS — J4550 Severe persistent asthma, uncomplicated: Secondary | ICD-10-CM | POA: Insufficient documentation

## 2017-02-22 DIAGNOSIS — J3089 Other allergic rhinitis: Secondary | ICD-10-CM | POA: Insufficient documentation

## 2017-02-22 MED ORDER — BUDESONIDE 0.5 MG/2ML IN SUSP: 0.5000 mg | Freq: Two times a day (BID) | RESPIRATORY_TRACT | 0 refills | Status: DC

## 2017-02-22 MED ORDER — MONTELUKAST SODIUM 10 MG PO TABS: 10.0000 mg | ORAL_TABLET | Freq: Every day | ORAL | 5 refills | Status: DC

## 2017-02-22 MED ORDER — LEVOCETIRIZINE DIHYDROCHLORIDE 5 MG PO TABS: 5.0000 mg | ORAL_TABLET | Freq: Every evening | ORAL | 2 refills | Status: DC

## 2017-02-22 NOTE — Assessment & Plan Note (Signed)
The patient's history suggests recurrence of nasal polyps after polypectomy.  I have recommended nasal steroid rinse twice a day (as above).  If symptoms persist or progress, otolaryngology evaluation will be necessary.

## 2017-02-22 NOTE — Assessment & Plan Note (Signed)
Currently with suboptimal control.  A prescription has been provided for montelukast (as above).  Continue Symbicort 160-4.5 g, 2 inhalations twice a day and Combivent as needed. To maximize pulmonary deposition, a spacer has been provided along with instructions for its proper administration with an HFA inhaler.  To assess candidacy for omalizumab (Xolair), a lab order form has been provided for serum IgE level.  Recent CBC with differential shows normal eosinophil count, therefore she is not a candidate for IL-5 therapy.

## 2017-02-22 NOTE — Patient Instructions (Addendum)
Perennial and seasonal allergic rhinitis  Aeroallergen avoidance measures have been discussed and provided in written form.  Start budesonide/saline nasal irrigation twice a day.  A prescription has been provided for budesonide 0.5 mg respules and instructions for mixing and adminstering the rinse have been discussed and provided in written form.  A prescription has been provided for montelukast 10 mg daily at bedtime.  A prescription has been provided for levocetirizine, 5mg  daily as needed.  The risks and benefits of aeroallergen immunotherapy have been discussed. The patient is motivated to initiate immunotherapy to reduce symptoms and decrease medication requirement. Informed consent has been signed and allergen vaccine orders have been submitted. Medications will be decreased or discontinued as symptom relief from immunotherapy becomes evident.  Severe persistent asthma Currently with suboptimal control.  A prescription has been provided for montelukast (as above).  Continue Symbicort 160-4.5 g, 2 inhalations twice a day and Combivent as needed. To maximize pulmonary deposition, a spacer has been provided along with instructions for its proper administration with an HFA inhaler.  To assess candidacy for omalizumab (Xolair), a lab order form has been provided for serum IgE level.  Recent CBC with differential shows normal eosinophil count, therefore she is not a candidate for IL-5 therapy.  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  I have recommended nasal steroid rinse twice a day (as above).  If symptoms persist or progress, otolaryngology evaluation will be necessary.   Return in about 2 months (around 04/24/2017), or if symptoms worsen or fail to improve.  Budesonide (Pulmicort) + Saline Irrigation/Rinse  Budesonide (Pulmicort) is an anti-inflammatory steroid medication used to decrease nasal and sinus inflammation.  It is dispensed in liquid form in a vial. Although it is manufactured for use with a nebulizer, we intend for you to use it with the NeilMed Sinus Rinse bottle (preferred) or a Neti pot.   Instructions:  1) Make 240cc of saline in the NeilMed bottle using the salt packets or your own saline recipe (see separate handout).  2) Add the entire 2cc vial of liquid Budesonide (Pulmicort) to the rinse bottle and mix together.  3) While in the shower or over the sink, tilt your head forward to a comfortable level. Put the tip of the sinus rinse bottle in your nostril and aim it towards the crown or top of your head. Gently squeeze the bottle to flush out your nose. The fluid will circulate in and out of your sinus cavities, coming back out from either nostril or through your mouth. Try not to swallow large quantities and spit it out instead.  4) Perform Budesonide (Pulmicort) + Saline irrigations 2 times daily.  Reducing Pollen Exposure  The American Academy of Allergy, Asthma and Immunology suggests the following steps to reduce your exposure to pollen during allergy seasons.    1. Do not hang sheets or clothing out to dry; pollen may collect on these items. 2. Do not mow lawns or spend time around freshly cut grass; mowing stirs up pollen. 3. Keep windows closed at night.  Keep car windows closed while driving. 4. Minimize morning activities outdoors, a time when pollen counts are usually at their highest. 5. Stay indoors as much as possible when pollen counts or humidity is high and on windy days when pollen tends to remain in the air longer. 6. Use air conditioning when possible.  Many air conditioners have filters that trap the pollen spores. 7. Use a HEPA  room air filter to remove pollen form the indoor air you breathe.   Control of House Dust Mite Allergen  House dust mites play a major role in allergic asthma and rhinitis.  They occur in environments with high humidity wherever human skin, the  food for dust mites is found. High levels have been detected in dust obtained from mattresses, pillows, carpets, upholstered furniture, bed covers, clothes and soft toys.  The principal allergen of the house dust mite is found in its feces.  A gram of dust may contain 1,000 mites and 250,000 fecal particles.  Mite antigen is easily measured in the air during house cleaning activities.    1. Encase mattresses, including the box spring, and pillow, in an air tight cover.  Seal the zipper end of the encased mattresses with wide adhesive tape. 2. Wash the bedding in water of 130 degrees Farenheit weekly.  Avoid cotton comforters/quilts and flannel bedding: the most ideal bed covering is the dacron comforter. 3. Remove all upholstered furniture from the bedroom. 4. Remove carpets, carpet padding, rugs, and non-washable window drapes from the bedroom.  Wash drapes weekly or use plastic window coverings. 5. Remove all non-washable stuffed toys from the bedroom.  Wash stuffed toys weekly. 6. Have the room cleaned frequently with a vacuum cleaner and a damp dust-mop.  The patient should not be in a room which is being cleaned and should wait 1 hour after cleaning before going into the room. 7. Close and seal all heating outlets in the bedroom.  Otherwise, the room will become filled with dust-laden air.  An electric heater can be used to heat the room. Reduce indoor humidity to less than 50%.  Do not use a humidifier.  Control of Dog or Cat Allergen  Avoidance is the best way to manage a dog or cat allergy. If you have a dog or cat and are allergic to dog or cats, consider removing the dog or cat from the home. If you have a dog or cat but don't want to find it a new home, or if your family wants a pet even though someone in the household is allergic, here are some strategies that may help keep symptoms at bay:  1. Keep the pet out of your bedroom and restrict it to only a few rooms. Be advised that keeping  the dog or cat in only one room will not limit the allergens to that room. 2. Don't pet, hug or kiss the dog or cat; if you do, wash your hands with soap and water. 3. High-efficiency particulate air (HEPA) cleaners run continuously in a bedroom or living room can reduce allergen levels over time. 4. Place electrostatic material sheet in the air inlet vent in the bedroom. 5. Regular use of a high-efficiency vacuum cleaner or a central vacuum can reduce allergen levels. 6. Giving your dog or cat a bath at least once a week can reduce airborne allergen.  Control of Mold Allergen  Mold and fungi can grow on a variety of surfaces provided certain temperature and moisture conditions exist.  Outdoor molds grow on plants, decaying vegetation and soil.  The major outdoor mold, Alternaria and Cladosporium, are found in very high numbers during hot and dry conditions.  Generally, a late Summer - Fall peak is seen for common outdoor fungal spores.  Rain will temporarily lower outdoor mold spore count, but counts rise rapidly when the rainy period ends.  The most important indoor molds are Aspergillus and Penicillium.  Dark, humid and poorly ventilated basements are ideal sites for mold growth.  The next most common sites of mold growth are the bathroom and the kitchen.  Outdoor [employer] 1. Use air conditioning and keep windows closed 2. Avoid exposure to decaying vegetation. 3. Avoid leaf raking. 4. Avoid grain handling. 5. Consider wearing a face mask if working in moldy areas.  Indoor Mold Control 1. Maintain humidity below 50%. 2. Clean washable surfaces with 5% bleach solution. 3. Remove sources e.g. Contaminated carpets.  Control of Cockroach Allergen  Cockroach allergen has been identified as an important cause of acute attacks of asthma, especially in urban settings.  There are fifty-five species of cockroach that exist in the [place], however only three, the [place], [place] species produce allergen that can affect patients with Asthma.  Allergens can be obtained from fecal particles, egg casings and secretions from cockroaches.    1. Remove food sources. 2. Reduce access to water. 3. Seal access and entry points. 4. Spray runways with 0.5-1% Diazinon or Chlorpyrifos 5. Blow boric acid power under stoves and refrigerator. 6. Place bait stations (hydramethylnon) at feeding sites.

## 2017-02-22 NOTE — Progress Notes (Signed)
New Patient Note  RE: [name] MRN: [id number] DOB: Sep 23, 1975 Date of Office Visit: 02/22/2017  Referring provider: Nolene Ebbs, MD Primary care provider: Nolene Ebbs, MD  Chief Complaint: Asthma and Allergic Rhinitis    History of present illness: [name] is a 41 y.o. female seen today in consultation requested by Nolene Ebbs, MD.  She complains of nasal congestion, rhinorrhea "constantly", thick postnasal drainage, sinus pressure over the forehead and cheekbones, and ocular pruritus.  These symptoms occur year around but her more frequent and severe with pollen exposure.  She currently attempts to control the symptoms with fluticasone nasal spray and cetirizine, though with inadequate results.  She reports that she has had aeroallergen immunotherapy in the past with benefit, however she did not receive the injections for the recommended length of treatment and her symptoms gradually returned. She is interested in the possibility of restarting aeroallergen immunotherapy to reduce symptoms and decrease medication requirement.  She states that she had a nasal polypectomy approximately 2 years ago.  Initially she regained some of her sense of smell and had reduced nasal congestion, however after approximately 2 months she once again became anosmic and has experienced persistent, severe nasal congestion despite fluticasone nasal spray once daily.  She does not use nasal saline irrigation.  She was diagnosed with asthma as a child.  Her typical asthma symptoms include dyspnea, chest tightness, coughing, and wheezing.  These symptoms are specifically triggered by pollen exposure, dust, heat, and exercise.  She reports that recently she has been experiencing asthma symptoms almost daily and nocturnal awakenings due to lower respiratory symptoms 10 nights out of the month on average.  She reports that she experiences lower respiratory symptoms with even mild exertion.  She has  attempted to stay indoors or wear a mask when outdoors.  She currently takes Symbicort 160-4.5 g, 2 inhalations twice a day.  She does not use a spacer device.  She is able to tolerate aspirin and NSAIDs without provocation of lower respiratory symptoms or other systemic symptoms.   Assessment and plan: Perennial and seasonal allergic rhinitis  Aeroallergen avoidance measures have been discussed and provided in written form.  Start budesonide/saline nasal irrigation twice a day.  A prescription has been provided for budesonide 0.5 mg respules and instructions for mixing and adminstering the rinse have been discussed and provided in written form.  A prescription has been provided for montelukast 10 mg daily at bedtime.  A prescription has been provided for levocetirizine, 5mg  daily as needed.  The risks and benefits of aeroallergen immunotherapy have been discussed. The patient is motivated to initiate immunotherapy to reduce symptoms and decrease medication requirement. Informed consent has been signed and allergen vaccine orders have been submitted. Medications will be decreased or discontinued as symptom relief from immunotherapy becomes evident.  Severe persistent asthma Currently with suboptimal control.  A prescription has been provided for montelukast (as above).  Continue Symbicort 160-4.5 g, 2 inhalations twice a day and Combivent as needed. To maximize pulmonary deposition, a spacer has been provided along with instructions for its proper administration with an HFA inhaler.  To assess candidacy for omalizumab (Xolair), a lab order form has been provided for serum IgE level.  Recent CBC with differential shows normal eosinophil count, therefore she is not a candidate for IL-5 therapy.  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  I have recommended nasal steroid rinse twice a day (as above).  If symptoms persist or  progress, otolaryngology evaluation will be necessary.   Meds ordered this encounter  Medications  . montelukast (SINGULAIR) 10 MG tablet    Sig: Take 1 tablet (10 mg total) by mouth at bedtime.    Dispense:  30 tablet    Refill:  5  . budesonide (PULMICORT) 0.5 MG/2ML nebulizer solution    Sig: Take 2 mLs (0.5 mg total) by nebulization 2 (two) times daily.    Dispense:  2 mL    Refill:  0  . levocetirizine (XYZAL) 5 MG tablet    Sig: Take 1 tablet (5 mg total) by mouth every evening.    Dispense:  30 tablet    Refill:  2    Diagnostics: Spirometry: FVC was 1.79 L (52% predicted) and FEV1 was 1.23 L (44% predicted) with 14% post-bronchodilator improvement.   Please see scanned spirometry results for details. Environmental skin testing: Positive to grass pollens, weed pollens, ragweed pollen, tree pollens, molds, cat hair, dog epithelia, cockroach antigen, and dust mite antigen.     Physical examination: Blood pressure 132/78, pulse 84, temperature 97.9 F (36.6 C), resp. rate 19, height 5' 2.5" (1.588 m), weight 274 lb 6.4 oz (124.5 kg), SpO2 96 %.  General: Alert, interactive, in no acute distress. HEENT: TMs pearly gray, turbinates edematous with clear discharge, post-pharynx erythematous. Neck: Supple without lymphadenopathy. Lungs: Mildly decreased breath sounds with expiratory wheezing bilaterally. CV: Normal S1, S2 without murmurs. Abdomen: Nondistended, nontender. Skin: Warm and dry, without lesions or rashes. Extremities:  No clubbing, cyanosis or edema. Neuro:   Grossly intact.  Review of systems:  Review of systems negative except as noted in HPI / PMHx or noted below: Review of Systems  Constitutional: Negative.   HENT: Negative.   Eyes: Negative.   Respiratory: Negative.   Cardiovascular: Negative.   Gastrointestinal: Negative.   Genitourinary: Negative.   Musculoskeletal: Negative.   Skin: Negative.   Neurological: Negative.   Endo/Heme/Allergies:  Negative.   Psychiatric/Behavioral: Negative.     Past medical history:  Past Medical History:  Diagnosis Date  . Anemia   . Asthma   . Blood transfusion without reported diagnosis   . Environmental allergies   . Fibroid, uterine   . Fibroids   . GERD (gastroesophageal reflux disease)   . Menorrhagia   . Shortness of breath     Past surgical history:  Past Surgical History:  Procedure Laterality Date  . CHOLECYSTECTOMY     06/29/2011  . CHOLECYSTECTOMY  06/29/2011   Procedure: LAPAROSCOPIC CHOLECYSTECTOMY WITH INTRAOPERATIVE CHOLANGIOGRAM;  Surgeon: Merrie Roof, MD;  Location: [place];  Service: General;  Laterality: N/A;  . ECTOPIC PREGNANCY SURGERY    . ERCP  06/30/2011   Procedure: ENDOSCOPIC RETROGRADE CHOLANGIOPANCREATOGRAPHY (ERCP);  Surgeon: Jeryl Columbia, MD;  Location: [facility] ENDOSCOPY;  Service: Endoscopy;  Laterality: N/A;  probable sphincterotomy  . ERCP  07/03/2011   Procedure: ENDOSCOPIC RETROGRADE CHOLANGIOPANCREATOGRAPHY (ERCP);  Surgeon: Landry Dyke, MD;  Location: [place];  Service: Endoscopy;  Laterality: N/A;  PROPOFOL  . MYOMECTOMY    . MYOMECTOMY N/A 11/14/2012   Procedure: MYOMECTOMY;  Surgeon: Osborne Oman, MD;  Location: [facility];  Service: Gynecology;  Laterality: N/A;  . NASAL POLYP EXCISION  2016    Family history: Family History  Problem Relation Age of Onset  . Hypertension Mother   . Diabetes Mother   . Hypertension Father   . Hypertension Brother   . Anesthesia problems Neg  Hx   . Hypotension Neg Hx   . Malignant hyperthermia Neg Hx   . Pseudochol deficiency Neg Hx     Social history: Social History   Social History  . Marital status: Married    Spouse name: N/A  . Number of children: N/A  . Years of education: N/A   Occupational History  . Not on file.   Social History Main Topics  . Smoking status: Never Smoker  . Smokeless tobacco: Never Used  . Alcohol use No  . Drug use: No  . Sexual activity: Not Currently     Birth control/ protection: None   Other Topics Concern  . Not on file   Social History Narrative  . No narrative on file   Environmental History: The patient lives in a house with carpeting throughout, gas heat, and central air.  There is no known mold/water damage in the home.  She is a nonsmoker and has no pets.  Allergies as of 02/22/2017      Reactions   Bee Venom Anaphylaxis   Patient has an epi pen, also has not been stung before but was informed she is allergic   Prednisone Hives   Patient has been rechallenged with prednisone and experienced the same symptoms of hives and face swelling.  She can tolerate methylprednisolone IV and PO.      Medication List       Accurate as of 02/22/17  1:45 PM. Always use your most recent med list.          albuterol 108 (90 Base) MCG/ACT inhaler Commonly known as:  PROVENTIL HFA;VENTOLIN HFA Inhale 2 puffs into the lungs every 6 (six) hours as needed for wheezing or shortness of breath.   albuterol (2.5 MG/3ML) 0.083% nebulizer solution Commonly known as:  PROVENTIL USE 1 VIAL IN NEBULIZER AND BREATH INTO THE LUNGS EVERY 6 HOURS AS NEEDED   amLODipine 10 MG tablet Commonly known as:  NORVASC Take 1 tablet (10 mg total) by mouth daily.   budesonide 0.5 MG/2ML nebulizer solution Commonly known as:  PULMICORT Take 2 mLs (0.5 mg total) by nebulization 2 (two) times daily.   budesonide-formoterol 160-4.5 MCG/ACT inhaler Commonly known as:  SYMBICORT Inhale 2 puffs into the lungs 2 (two) times daily as needed (for shortness of breath).   cetirizine 10 MG tablet Commonly known as:  ZYRTEC Take 10 mg by mouth daily.   EPINEPHrine 0.3 mg/0.3 mL Soaj injection Commonly known as:  EPI-PEN Inject 0.3 mg into the muscle once as needed for anaphylaxis. For up to 2 doses.   fluticasone 27.5 MCG/SPRAY nasal spray Commonly known as:  VERAMYST Place 2 sprays into the nose 2 (two) times daily as needed for rhinitis or allergies.     ibuprofen 200 MG tablet Commonly known as:  ADVIL,MOTRIN Take 200 mg by mouth every 6 (six) hours as needed for headache.   ipratropium-albuterol 0.5-2.5 (3) MG/3ML Soln Commonly known as:  DUONEB Take 3 mLs by nebulization every 6 (six) hours as needed.   IRON PO Take by mouth.   levocetirizine 5 MG tablet Commonly known as:  XYZAL Take 1 tablet (5 mg total) by mouth every evening.   methylPREDNISolone 4 MG Tbpk tablet Commonly known as:  MEDROL DOSEPAK Use as directed.   montelukast 10 MG tablet Commonly known as:  SINGULAIR Take 1 tablet (10 mg total) by mouth at bedtime.   prenatal multivitamin Tabs tablet Take 1 tablet by mouth daily at 12 noon.  ranitidine 300 MG tablet Commonly known as:  ZANTAC Take 300 mg by mouth every evening.       Known medication allergies: Allergies  Allergen Reactions  . Bee Venom Anaphylaxis    Patient has an epi pen, also has not been stung before but was informed she is allergic  . Prednisone Hives    Patient has been rechallenged with prednisone and experienced the same symptoms of hives and face swelling.  She can tolerate methylprednisolone IV and PO.    I appreciate the opportunity to take part in Jaylee's care. Please do not hesitate to contact me with questions.  Sincerely,   R. Edgar Frisk, MD

## 2017-02-22 NOTE — Assessment & Plan Note (Signed)
   Aeroallergen avoidance measures have been discussed and provided in written form.  Start budesonide/saline nasal irrigation twice a day.  A prescription has been provided for budesonide 0.5 mg respules and instructions for mixing and adminstering the rinse have been discussed and provided in written form.  A prescription has been provided for montelukast 10 mg daily at bedtime.  A prescription has been provided for levocetirizine, 5mg  daily as needed.  The risks and benefits of aeroallergen immunotherapy have been discussed. The patient is motivated to initiate immunotherapy to reduce symptoms and decrease medication requirement. Informed consent has been signed and allergen vaccine orders have been submitted. Medications will be decreased or discontinued as symptom relief from immunotherapy becomes evident.

## 2017-02-24 LAB — IGE: IgE (Immunoglobulin E), Serum: 334 IU/mL — ABNORMAL HIGH (ref 0–100)

## 2017-02-24 LAB — PLEASE NOTE

## 2017-03-04 DIAGNOSIS — J3089 Other allergic rhinitis: Secondary | ICD-10-CM | POA: Diagnosis not present

## 2017-03-05 DIAGNOSIS — J301 Allergic rhinitis due to pollen: Secondary | ICD-10-CM | POA: Diagnosis not present

## 2017-03-08 ENCOUNTER — Ambulatory Visit: Payer: Medicare HMO

## 2017-03-09 ENCOUNTER — Ambulatory Visit (INDEPENDENT_AMBULATORY_CARE_PROVIDER_SITE_OTHER): Payer: Medicare HMO

## 2017-03-09 DIAGNOSIS — J309 Allergic rhinitis, unspecified: Secondary | ICD-10-CM | POA: Diagnosis not present

## 2017-03-11 ENCOUNTER — Ambulatory Visit (INDEPENDENT_AMBULATORY_CARE_PROVIDER_SITE_OTHER): Payer: Medicare HMO | Admitting: *Deleted

## 2017-03-11 DIAGNOSIS — J309 Allergic rhinitis, unspecified: Secondary | ICD-10-CM | POA: Diagnosis not present

## 2017-03-17 ENCOUNTER — Ambulatory Visit (INDEPENDENT_AMBULATORY_CARE_PROVIDER_SITE_OTHER): Payer: Medicare HMO | Admitting: *Deleted

## 2017-03-17 DIAGNOSIS — J309 Allergic rhinitis, unspecified: Secondary | ICD-10-CM | POA: Diagnosis not present

## 2017-04-08 ENCOUNTER — Ambulatory Visit (INDEPENDENT_AMBULATORY_CARE_PROVIDER_SITE_OTHER): Payer: Medicare HMO

## 2017-04-08 DIAGNOSIS — J309 Allergic rhinitis, unspecified: Secondary | ICD-10-CM

## 2017-04-12 ENCOUNTER — Ambulatory Visit: Payer: Medicare HMO

## 2017-04-14 ENCOUNTER — Ambulatory Visit (INDEPENDENT_AMBULATORY_CARE_PROVIDER_SITE_OTHER): Payer: Medicare HMO

## 2017-04-14 DIAGNOSIS — J309 Allergic rhinitis, unspecified: Secondary | ICD-10-CM

## 2017-04-20 ENCOUNTER — Ambulatory Visit (INDEPENDENT_AMBULATORY_CARE_PROVIDER_SITE_OTHER): Payer: Medicare HMO

## 2017-04-20 DIAGNOSIS — J4540 Moderate persistent asthma, uncomplicated: Secondary | ICD-10-CM | POA: Diagnosis not present

## 2017-04-20 MED ORDER — OMALIZUMAB 150 MG ~~LOC~~ SOLR
375.0000 mg | SUBCUTANEOUS | Status: DC
  Administered 2017-04-20 – 2018-03-07 (×7): 375 mg via SUBCUTANEOUS

## 2017-04-21 ENCOUNTER — Encounter: Payer: Self-pay | Admitting: Obstetrics & Gynecology

## 2017-04-21 ENCOUNTER — Ambulatory Visit (INDEPENDENT_AMBULATORY_CARE_PROVIDER_SITE_OTHER): Payer: Medicare HMO | Admitting: Obstetrics & Gynecology

## 2017-04-21 VITALS — BP 138/92 | HR 79 | Wt 272.0 lb

## 2017-04-21 DIAGNOSIS — D219 Benign neoplasm of connective and other soft tissue, unspecified: Secondary | ICD-10-CM | POA: Diagnosis not present

## 2017-04-21 DIAGNOSIS — N920 Excessive and frequent menstruation with regular cycle: Secondary | ICD-10-CM

## 2017-04-21 MED ORDER — TRANEXAMIC ACID 650 MG PO TABS: 1300.0000 mg | ORAL_TABLET | Freq: Three times a day (TID) | ORAL | 2 refills | Status: DC

## 2017-04-21 MED ORDER — NAPROXEN 500 MG PO TABS: 500.0000 mg | ORAL_TABLET | Freq: Two times a day (BID) | ORAL | 2 refills | Status: DC

## 2017-04-21 NOTE — Progress Notes (Signed)
Immunotherapy   Patient Details  Name: SIDNIE SWALLEY MRN: [id number] Date of Birth: 1975-08-16  04/21/2017  Cephus Shelling stared Xolair 375 mg  Frequency: Q 2 weeks Epi-Pen:Epi-Pen Available  Consent signed and patient instructions given.   Garima Chronis 04/21/2017, 11:56 AM

## 2017-04-21 NOTE — Patient Instructions (Signed)
Uterine Fibroids Uterine fibroids are tissue masses (tumors) that can develop in the womb (uterus). They are also called leiomyomas. This type of tumor is not cancerous (benign) and does not spread to other parts of the body outside of the pelvic area, which is between the hip bones. Occasionally, fibroids may develop in the fallopian tubes, in the cervix, or on the support structures (ligaments) that surround the uterus. You can have one or many fibroids. Fibroids can vary in size, weight, and where they grow in the uterus. Some can become quite large. Most fibroids do not require medical treatment. What are the causes? A fibroid can develop when a single uterine cell keeps growing (replicating). Most cells in the human body have a control mechanism that keeps them from replicating without control. What are the signs or symptoms? Symptoms may include:  Heavy bleeding during your period.  Bleeding or spotting between periods.  Pelvic pain and pressure.  Bladder problems, such as needing to urinate more often (urinary frequency) or urgently.  Inability to reproduce offspring (infertility).  Miscarriages.  How is this diagnosed? Uterine fibroids are diagnosed through a physical exam. Your health care provider may feel the lumpy tumors during a pelvic exam. Ultrasonography and an MRI may be done to determine the size, location, and number of fibroids. How is this treated? Treatment may include:  Watchful waiting. This involves getting the fibroid checked by your health care provider to see if it grows or shrinks. Follow your health care provider's recommendations for how often to have this checked.  Hormone medicines. These can be taken by mouth or given through an intrauterine device (IUD).  Surgery. ? Removing the fibroids (myomectomy) or the uterus (hysterectomy). ? Removing blood supply to the fibroids (uterine artery embolization).  If fibroids interfere with your fertility and you  want to become pregnant, your health care provider may recommend having the fibroids removed. Follow these instructions at home:  Keep all follow-up visits as directed by your health care provider. This is important.  Take over-the-counter and prescription medicines only as told by your health care provider. ? If you were prescribed a hormone treatment, take the hormone medicines exactly as directed.  Ask your health care provider about taking iron pills and increasing the amount of dark green, leafy vegetables in your diet. These actions can help to boost your blood iron levels, which may be affected by heavy menstrual bleeding.  Pay close attention to your period and tell your health care provider about any changes, such as: ? Increased blood flow that requires you to use more pads or tampons than usual per month. ? A change in the number of days that your period lasts per month. ? A change in symptoms that are associated with your period, such as abdominal cramping or back pain. Contact a health care provider if:  You have pelvic pain, back pain, or abdominal cramps that cannot be controlled with medicines.  You have an increase in bleeding between and during periods.  You soak tampons or pads in a half hour or less.  You feel lightheaded, extra tired, or weak. Get help right away if:  You faint.  You have a sudden increase in pelvic pain. This information is not intended to replace advice given to you by your health care provider. Make sure you discuss any questions you have with your health care provider. Document Released: 06/26/2000 Document Revised: 02/27/2016 Document Reviewed: 12/26/2013 Elsevier Interactive Patient Education  2018 [employer]  

## 2017-04-21 NOTE — Progress Notes (Signed)
GYNECOLOGY OFFICE VISIT NOTE  History:  41 y.o. G1P0010 here today for evaluation of increased menorrhagia for the past two months. Heavy bleeding for first 2-3 days of cycle, unable to leave house sometimes due to fear of having too much bleeding, changes 5-7 pads in 3-4 hours.  The last 2 days of her 5 day periods are normal.  Had this before her myomectomy in 2014.  No presyncopal symptoms, but a lot of pain, radiates down right leg. Takes Ibuprofen which helps.  She denies any other concerns.   Past Medical History:  Diagnosis Date  . Anemia   . Asthma   . Blood transfusion without reported diagnosis   . Environmental allergies   . Fibroid, uterine   . Fibroids   . GERD (gastroesophageal reflux disease)   . Menorrhagia   . Shortness of breath     Past Surgical History:  Procedure Laterality Date  . CHOLECYSTECTOMY     06/29/2011  . CHOLECYSTECTOMY  06/29/2011   Procedure: LAPAROSCOPIC CHOLECYSTECTOMY WITH INTRAOPERATIVE CHOLANGIOGRAM;  Surgeon: Merrie Roof, MD;  Location: [facility];  Service: General;  Laterality: N/A;  . ECTOPIC PREGNANCY SURGERY    . ERCP  06/30/2011   Procedure: ENDOSCOPIC RETROGRADE CHOLANGIOPANCREATOGRAPHY (ERCP);  Surgeon: Jeryl Columbia, MD;  Location: [facility];  Service: Endoscopy;  Laterality: N/A;  probable sphincterotomy  . ERCP  07/03/2011   Procedure: ENDOSCOPIC RETROGRADE CHOLANGIOPANCREATOGRAPHY (ERCP);  Surgeon: Landry Dyke, MD;  Location: [facility];  Service: Endoscopy;  Laterality: N/A;  PROPOFOL  . MYOMECTOMY    . MYOMECTOMY N/A 11/14/2012   Procedure: MYOMECTOMY;  Surgeon: Osborne Oman, MD;  Location: [facility];  Service: Gynecology;  Laterality: N/A;  . NASAL POLYP EXCISION  2016    The following portions of the patient's history were reviewed and updated as appropriate: allergies, current medications, past family history, past medical history, past social history, past surgical history and problem list.   Health Maintenance:   Normal pap smear and negative high-risk HPV on 11/18/2015.   Review of Systems:  Pertinent items noted in HPI and remainder of comprehensive ROS otherwise negative.   Objective:  Physical Exam BP (!) 138/92   Pulse 79   Wt 272 lb (123.4 kg)   BMI 48.96 kg/m  CONSTITUTIONAL: Well-developed, well-nourished female in no acute distress.  HENT:  Normocephalic, atraumatic. External right and left ear normal. Oropharynx is clear and moist EYES: Conjunctivae and EOM are normal. Pupils are equal, round, and reactive to light. No scleral icterus.  NECK: Normal range of motion, supple, no masses SKIN: Skin is warm and dry. No rash noted. Not diaphoretic. No erythema. No pallor. NEUROLOGIC: Alert and oriented to person, place, and time. Normal reflexes, muscle tone coordination. No cranial nerve deficit noted. PSYCHIATRIC: Normal mood and affect. Normal behavior. Normal judgment and thought content. CARDIOVASCULAR: Normal heart rate noted RESPIRATORY: Effort and breath sounds normal, no problems with respiration noted ABDOMEN: Soft, obese, no distention noted.   PELVIC: Normal appearing external genitalia; normal appearing vaginal mucosa and cervix.  Bloody discharge noted.  No lesions seen. Unable to palpate uterus or adnexa secondary to habitus. MUSCULOSKELETAL: Normal range of motion. No edema noted.  Assessment & Plan:  1. Fibroids 2. Menorrhagia with regular cycle Will obtain ultrasound for evaluation.  Labs also ordered.  Discussed management options for abnormal uterine bleeding including NSAIDs, tranexamic acid (Lysteda), oral progesterone (Megace), Depo Provera, Mirena IUD, endometrial ablation (Novasure/Hydrothermal Ablation) or hysterectomy as definitive surgical  management.  Discussed risks and benefits of each method.   Patient desires NSAIDs and Lysteda for now.  Lysteda and naproxen prescribed as needed for now,  bleeding precautions reviewed.   - US PELVIC COMPLETE WITH TRANSVAGINAL;  Future - CBC - TSH - naproxen (NAPROSYN) 500 MG tablet; Take 1 tablet (500 mg total) by mouth 2 (two) times daily with a meal. As needed for pain  Dispense: 60 tablet; Refill: 2 - tranexamic acid (LYSTEDA) 650 MG TABS tablet; Take 2 tablets (1,300 mg total) by mouth 3 (three) times daily. Take during menses for a maximum of five days  Dispense: 30 tablet; Refill: 2  Routine preventative health maintenance measures emphasized. Please refer to After Visit Summary for other counseling recommendations.   Return in about 2 months (around 06/21/2017) for Followup menorrhagia.  Total face-to-face time with patient: 25 minutes. Over 50% of encounter was spent on counseling and coordination of care.   Verita Schneiders, MD, [place] [facility] [place], [facility] for [employer], [place]

## 2017-04-22 LAB — CBC
Hematocrit: 35.2 % (ref 34.0–46.6)
Hemoglobin: 10.6 g/dL — ABNORMAL LOW (ref 11.1–15.9)
MCH: 19.7 pg — AB (ref 26.6–33.0)
MCHC: 30.1 g/dL — AB (ref 31.5–35.7)
MCV: 65 fL — AB (ref 79–97)
PLATELETS: 474 10*3/uL — AB (ref 150–379)
RBC: 5.38 x10E6/uL — ABNORMAL HIGH (ref 3.77–5.28)
RDW: 19.1 % — AB (ref 12.3–15.4)
WBC: 14.2 10*3/uL — ABNORMAL HIGH (ref 3.4–10.8)

## 2017-04-22 LAB — TSH: TSH: 1.82 u[IU]/mL (ref 0.450–4.500)

## 2017-04-26 ENCOUNTER — Ambulatory Visit: Payer: Medicare HMO | Admitting: Allergy and Immunology

## 2017-04-29 ENCOUNTER — Ambulatory Visit (HOSPITAL_COMMUNITY)
Admission: RE | Admit: 2017-04-29 | Discharge: 2017-04-29 | Disposition: A | Discharge status: Home / Self Care | Payer: Medicare HMO | Source: Ambulatory Visit | Attending: Obstetrics & Gynecology | Admitting: Obstetrics & Gynecology

## 2017-04-29 DIAGNOSIS — D219 Benign neoplasm of connective and other soft tissue, unspecified: Secondary | ICD-10-CM

## 2017-04-29 DIAGNOSIS — D252 Subserosal leiomyoma of uterus: Secondary | ICD-10-CM | POA: Insufficient documentation

## 2017-04-29 DIAGNOSIS — N83292 Other ovarian cyst, left side: Secondary | ICD-10-CM | POA: Insufficient documentation

## 2017-04-29 DIAGNOSIS — D251 Intramural leiomyoma of uterus: Secondary | ICD-10-CM | POA: Diagnosis not present

## 2017-04-29 DIAGNOSIS — N920 Excessive and frequent menstruation with regular cycle: Secondary | ICD-10-CM | POA: Diagnosis not present

## 2017-04-29 DIAGNOSIS — D259 Leiomyoma of uterus, unspecified: Secondary | ICD-10-CM | POA: Diagnosis present

## 2017-04-30 ENCOUNTER — Ambulatory Visit (INDEPENDENT_AMBULATORY_CARE_PROVIDER_SITE_OTHER): Payer: Medicare HMO

## 2017-04-30 DIAGNOSIS — J309 Allergic rhinitis, unspecified: Secondary | ICD-10-CM

## 2017-05-03 ENCOUNTER — Ambulatory Visit: Payer: Medicare HMO | Admitting: Allergy and Immunology

## 2017-05-05 ENCOUNTER — Ambulatory Visit (INDEPENDENT_AMBULATORY_CARE_PROVIDER_SITE_OTHER): Payer: Medicare HMO | Admitting: *Deleted

## 2017-05-05 DIAGNOSIS — J4540 Moderate persistent asthma, uncomplicated: Secondary | ICD-10-CM | POA: Diagnosis not present

## 2017-05-18 ENCOUNTER — Ambulatory Visit (INDEPENDENT_AMBULATORY_CARE_PROVIDER_SITE_OTHER): Payer: Medicare HMO

## 2017-05-18 DIAGNOSIS — J309 Allergic rhinitis, unspecified: Secondary | ICD-10-CM | POA: Diagnosis not present

## 2017-05-20 ENCOUNTER — Ambulatory Visit (INDEPENDENT_AMBULATORY_CARE_PROVIDER_SITE_OTHER): Payer: Medicare HMO | Admitting: *Deleted

## 2017-05-20 DIAGNOSIS — J4540 Moderate persistent asthma, uncomplicated: Secondary | ICD-10-CM | POA: Diagnosis not present

## 2017-05-31 ENCOUNTER — Encounter: Payer: Self-pay | Admitting: Allergy & Immunology

## 2017-05-31 ENCOUNTER — Ambulatory Visit (INDEPENDENT_AMBULATORY_CARE_PROVIDER_SITE_OTHER): Payer: Medicare HMO | Admitting: Allergy & Immunology

## 2017-05-31 VITALS — BP 150/96 | HR 90 | Temp 98.3°F | Resp 24

## 2017-05-31 DIAGNOSIS — J4551 Severe persistent asthma with (acute) exacerbation: Secondary | ICD-10-CM

## 2017-05-31 DIAGNOSIS — J3089 Other allergic rhinitis: Secondary | ICD-10-CM

## 2017-05-31 MED ORDER — METHYLPREDNISOLONE ACETATE 80 MG/ML IJ SUSP
80.0000 mg | Freq: Once | INTRAMUSCULAR | Status: AC
  Administered 2017-05-31: 80 mg via INTRAMUSCULAR

## 2017-05-31 MED ORDER — IPRATROPIUM-ALBUTEROL 0.5-2.5 (3) MG/3ML IN SOLN: 3.0000 mL | Freq: Four times a day (QID) | RESPIRATORY_TRACT | 2 refills | Status: DC | PRN

## 2017-05-31 MED ORDER — BUDESONIDE-FORMOTEROL FUMARATE 160-4.5 MCG/ACT IN AERO: 2.0000 | INHALATION_SPRAY | Freq: Two times a day (BID) | RESPIRATORY_TRACT | 5 refills | Status: DC

## 2017-05-31 MED ORDER — MONTELUKAST SODIUM 10 MG PO TABS: 10.0000 mg | ORAL_TABLET | Freq: Every day | ORAL | 5 refills | Status: DC

## 2017-05-31 MED ORDER — AZELASTINE HCL 0.15 % NA SOLN: 2.0000 | Freq: Two times a day (BID) | NASAL | 5 refills | Status: DC

## 2017-05-31 NOTE — Patient Instructions (Addendum)
1. Severe persistent asthma with acute exacerbation - Lung testing looked terrible, but it did improve with the nebulizer treatments. - We gave one injection of Depo-Medrol today.  - Daily controller medication(s): Xolair every two weeks, Singulair 10mg  daily and Symbicort 160/4.3mcg two puffs twice daily with spacer - Prior to physical activity: ProAir 2 puffs 10-15 minutes before physical activity. - Rescue medications: ProAir 4 puffs every 4-6 hours as needed - Asthma control goals:  * Full participation in all desired activities (may need albuterol before activity) * Albuterol use two time or less a week on average (not counting use with activity) * Cough interfering with sleep two time or less a month * Oral steroids no more than once a year * No hospitalizations  2. Seasonal and perennial allergic rhinitis - Continue with allergy injections at the same schedule. - Continue with Xyzal 5mg  daily. - Continue with Flonase two sprays per nostril daily. - Add on azelastine nasal spray 2 sprays per nostril 1-2 times daily. - Call us if there is no improvement in 2-3 days. - My work cell is [phone].  3. Return in about 4 weeks (around 06/28/2017).   Please inform us of any Emergency Department visits, hospitalizations, or changes in symptoms. Call us before going to the ED for breathing or allergy symptoms since we might be able to fit you in for a sick visit. Feel free to contact us anytime with any questions, problems, or concerns.  It was a pleasure to meet you today! Enjoy the Thanksgiving season!  Websites that have reliable patient information: 1. American Academy of Asthma, Allergy, and Immunology: www.aaaai.org 2. Food Allergy Research and Education (FARE): foodallergy.org 3. Mothers of Asthmatics: http://www.asthmacommunitynetwork.org 4. American College of Allergy, Asthma, and Immunology: www.acaai.org

## 2017-05-31 NOTE — Progress Notes (Signed)
FOLLOW UP  Date of Service/Encounter:  05/31/17   Assessment:   Severe persistent asthma with acute exacerbation - on Xolair  Perennial and seasonal allergic rhinitis - on allergen immunotherapy   Asthma Reportables:  Severity: severe persistent  Risk: high Control: not well controlled   Plan/Recommendations:   1. Severe persistent asthma with acute exacerbation - Lung testing looked terrible, but it did improve with the nebulizer treatments. - We gave one injection of Depo-Medrol today since she has an adverse drug reaction to prednisone.  - Daily controller medication(s): Xolair every two weeks, Singulair 10mg  daily and Symbicort 160/4.41mcg two puffs twice daily with spacer - Prior to physical activity: ProAir 2 puffs 10-15 minutes before physical activity. - Rescue medications: ProAir 4 puffs every 4-6 hours as needed - Asthma control goals:  * Full participation in all desired activities (may need albuterol before activity) * Albuterol use two time or less a week on average (not counting use with activity) * Cough interfering with sleep two time or less a month * Oral steroids no more than once a year * No hospitalizations - Could consider changing to an anti-IL5 agent if no improvement on Xolair.   2. Seasonal and perennial allergic rhinitis - Continue with allergy injections at the same schedule. - Continue with Xyzal 5mg  daily. - Continue with Flonase two sprays per nostril daily. - Add on azelastine nasal spray 2 sprays per nostril 1-2 times daily. - Call us if there is no improvement in 2-3 days. - My work cell is [phone].  3. Return in about 4 weeks (around 06/28/2017).  Subjective:   [name] is a 41 y.o. female presenting today for follow up of  Chief Complaint  Patient presents with  . Wheezing    [name] has a history of the following: Patient Active Problem List   Diagnosis Date Noted  . Perennial and seasonal allergic  rhinitis 02/22/2017  . Severe persistent asthma 02/22/2017  . Polyp of nasal cavity 02/22/2017  . Leukocytosis 10/15/2016  . Iron deficiency anemia due to chronic blood loss   . Symptomatic anemia   . Asthma exacerbation 10/11/2016  . Hypokalemia 10/11/2016  . Acute asthma 04/22/2016  . Acute sinusitis, unspecified 03/26/2014  . Essential hypertension 12/08/2013  . 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 Fibroids 08/18/2012  . Menorrhagia 08/18/2012  . Extrinsic asthma  08/15/2011    History obtained from: chart review and patient.  [name] Primary Care Provider is Nolene Ebbs, MD.     [name] is a 41 y.o. female presenting for a sick visit. She was last seen in August 2018 by Dr. Verlin Fester for a New Patient appointment. At that time, she was endorsing severe symptoms of nasal congestion and postnasal drip as well as severe asthma. She had testing that was positive to grasses, weeds, trees, ragweed, molds, cat, dog, cockroach as well as dust mite. She was started on budesonide nasal rinses, montelukast 10mg  daily, and Xyzal 5mg  daily. She did opt to start allergen immunotherapy. She was continued on Symbicort 160/4.5 two puffs BID and Xolair was added to her regimen.     Since the last visit, she was doing well for a period of time. However, around two months ago, she ran out of her Symbicort sample that had been provided be a her by her PCP. She then contacted her PCP on a number of occasions, but was unable to  get a refill. She did not realize that we could provide her with a prescription. In any case, her symptoms continued to worsen to the point where she is using her nebulizer every 3-4 hours with transient improvement. She is having night time coughing consistently throughout the night for the past month.   Xolair injections are going well, and she does feel that this is providing relief for her. She is also on her  shots, tolerating them well without adverse event. She remains on her nasal rinses as well as Singulair and Xyzal.   Of note, she has had an elevated absolute eosinophil count of 800 in October 2017 and 2100 in August 2015.   Otherwise, there have been no changes to her past medical history, surgical history, family history, or social history.    Review of Systems: a 14-point review of systems is pertinent for what is mentioned in HPI.  Otherwise, all other systems were negative. Constitutional: negative other than that listed in the HPI Eyes: negative other than that listed in the HPI Ears, nose, mouth, throat, and face: negative other than that listed in the HPI Respiratory: negative other than that listed in the HPI Cardiovascular: negative other than that listed in the HPI Gastrointestinal: negative other than that listed in the HPI Genitourinary: negative other than that listed in the HPI Integument: negative other than that listed in the HPI Hematologic: negative other than that listed in the HPI Musculoskeletal: negative other than that listed in the HPI Neurological: negative other than that listed in the HPI Allergy/Immunologic: negative other than that listed in the HPI    Objective:   Blood pressure (!) 150/96, pulse 90, temperature 98.3 F (36.8 C), temperature source Oral, resp. rate (!) 24, SpO2 96 %. There is no height or weight on file to calculate BMI.   Physical Exam:  General: Alert, interactive, in no acute distress. Difficulty speaking. Eyes: No conjunctival injection bilaterally, no discharge on the right, no discharge on the left and no Horner-Trantas dots present. PERRL bilaterally. EOMI without pain. No photophobia.  Ears: Right TM pearly gray with normal light reflex, Left TM pearly gray with normal light reflex, Right TM intact without perforation and Left TM intact without perforation.  Nose/Throat: External nose within normal limits, nasal crease  present and septum midline. Turbinates markedly edematous and pale with thick discharge. Posterior oropharynx erythematous with cobblestoning in the posterior oropharynx. Tonsils 2+ without exudates.  Tongue without thrush. Adenopathy: shoddy bilateral anterior cervical lymphadenopathy and no enlarged lymph nodes appreciated in the occipital, axillary, epitrochlear, inguinal, or popliteal regions. Lungs: Decreased breath sounds with expiratory wheezing bilaterally. Increased work of breathing. CV: Normal S1/S2. No murmurs. Capillary refill <2 seconds.  Skin: Warm and dry, without lesions or rashes. Neuro:   Grossly intact. No focal deficits appreciated. Responsive to questions.  Diagnostic studies:   Spirometry: results abnormal (FEV1: 1.74/31%, FVC: 0.93/32%, FEV1/FVC: 80%).    Spirometry consistent with possible restrictive disease. Albuterol/Atrovent nebulizer treatment given in clinic with significant improvement in FEV1 and FVC per ATS criteria. The FEV1 increased 331mL (49%) and the FVC increased 522mL (57%). Her spirometry remained consistent with restrictive disease even after the nebulizer treatment. She received two treatments in total prior to discharge with improvement in her physical exam, although her lung fields never sounded exactly normal.   Allergy Studies: none     Salvatore Marvel, MD [facility] [place]

## 2017-06-01 ENCOUNTER — Ambulatory Visit: Payer: Medicare HMO

## 2017-06-02 ENCOUNTER — Ambulatory Visit (INDEPENDENT_AMBULATORY_CARE_PROVIDER_SITE_OTHER): Payer: Medicare HMO

## 2017-06-02 DIAGNOSIS — J4540 Moderate persistent asthma, uncomplicated: Secondary | ICD-10-CM | POA: Diagnosis not present

## 2017-06-11 ENCOUNTER — Ambulatory Visit: Payer: Medicare HMO | Admitting: Allergy & Immunology

## 2017-06-15 ENCOUNTER — Ambulatory Visit: Payer: Self-pay

## 2017-06-17 ENCOUNTER — Ambulatory Visit (INDEPENDENT_AMBULATORY_CARE_PROVIDER_SITE_OTHER): Payer: Medicare HMO | Admitting: *Deleted

## 2017-06-17 DIAGNOSIS — J4540 Moderate persistent asthma, uncomplicated: Secondary | ICD-10-CM

## 2017-07-01 ENCOUNTER — Encounter: Payer: Self-pay | Admitting: Allergy & Immunology

## 2017-07-01 ENCOUNTER — Ambulatory Visit (INDEPENDENT_AMBULATORY_CARE_PROVIDER_SITE_OTHER): Payer: Medicare HMO | Admitting: Allergy & Immunology

## 2017-07-01 ENCOUNTER — Ambulatory Visit: Payer: Medicare HMO

## 2017-07-01 VITALS — BP 148/98 | HR 100

## 2017-07-01 DIAGNOSIS — J3089 Other allergic rhinitis: Secondary | ICD-10-CM | POA: Diagnosis not present

## 2017-07-01 DIAGNOSIS — J4551 Severe persistent asthma with (acute) exacerbation: Secondary | ICD-10-CM | POA: Diagnosis not present

## 2017-07-01 DIAGNOSIS — J0100 Acute maxillary sinusitis, unspecified: Secondary | ICD-10-CM | POA: Diagnosis not present

## 2017-07-01 DIAGNOSIS — J330 Polyp of nasal cavity: Secondary | ICD-10-CM | POA: Diagnosis not present

## 2017-07-01 DIAGNOSIS — J4550 Severe persistent asthma, uncomplicated: Secondary | ICD-10-CM

## 2017-07-01 MED ORDER — AMOXICILLIN-POT CLAVULANATE 875-125 MG PO TABS: 1.0000 | ORAL_TABLET | Freq: Two times a day (BID) | ORAL | 0 refills | Status: AC

## 2017-07-01 MED ORDER — METHYLPREDNISOLONE ACETATE 80 MG/ML IJ SUSP: 80.0000 mg | Freq: Once | INTRAMUSCULAR | Status: DC

## 2017-07-01 MED ORDER — METHYLPREDNISOLONE ACETATE 40 MG/ML IJ SUSP
40.0000 mg | Freq: Once | INTRAMUSCULAR | Status: AC
  Administered 2017-07-01: 40 mg via INTRAMUSCULAR

## 2017-07-01 NOTE — Patient Instructions (Addendum)
1. Severe persistent asthma with acute exacerbation - Lung testing looked terrible, but it did improve with the nebulizer treatments. - We gave one injection of Depo-Medrol today.  - Since you are still having exacerbations with Xolair, we will change you to Berna Bue (sample given today). - [name] will be reaching out to you to get some paperwork filled out for the [facility]. - We are going to do some labs to rule out more serious causes of asthma.  - Daily controller medication(s): Fasenra monthly, Singulair 10mg  daily and Symbicort 160/4.38mcg two puffs twice daily with spacer - Prior to physical activity: ProAir 2 puffs 10-15 minutes before physical activity. - Rescue medications: ProAir 4 puffs every 4-6 hours as needed - Asthma control goals:  * Full participation in all desired activities (may need albuterol before activity) * Albuterol use two time or less a week on average (not counting use with activity) * Cough interfering with sleep two time or less a month * Oral steroids no more than once a year * No hospitalizations  2. Seasonal and perennial allergic rhinitis - Continue with allergy injections at the same schedule. - Continue with Xyzal 5mg  daily. - Continue with Flonase two sprays per nostril daily. - Continue with azelastine nasal spray 2 sprays per nostril 1-2 times daily.  3. Acute sinusitis - With your current symptoms and time course, antibiotics are needed: Augmentin 875mg  twice daily for 10 days - Add on nasal saline spray (i.e., Simply Saline) or nasal saline lavage (i.e., NeilMed) as needed prior to medicated nasal sprays. - For thick post nasal drainage, add guaifenesin [phone] mg (Mucinex) twice daily as needed with adequate hydration as discussed.   4. Return in about 2 months (around 09/01/2017).   Please inform us of any Emergency Department visits, hospitalizations, or changes in symptoms. Call us before going to the ED for breathing or allergy symptoms since  we might be able to fit you in for a sick visit. Feel free to contact us anytime with any questions, problems, or concerns.  It was a pleasure to see you again today! Enjoy the holiday season!  Websites that have reliable patient information: 1. American Academy of Asthma, Allergy, and Immunology: www.aaaai.org 2. Food Allergy Research and Education (FARE): foodallergy.org 3. Mothers of Asthmatics: http://www.asthmacommunitynetwork.org 4. American College of Allergy, Asthma, and Immunology: www.acaai.org

## 2017-07-01 NOTE — Progress Notes (Signed)
FOLLOW UP  Date of Service/Encounter:  07/01/17   Assessment:   Severe persistent asthma with acute exacerbation - failed Xolair with two exacerbations in less than one month and recurrent use of her rescue inhaler  Nasal polyposis - without NSAID sensitivity  Perennial and seasonal allergic rhinitis  Acute non-recurrent maxillary sinusitis   Asthma Reportables:  Severity: severe persistent  Risk: high Control: not well controlled  Plan/Recommendations:   1. Severe persistent asthma with acute exacerbation - AEC 600 today, which qualifies her for [place] testing looked terrible, but it did improve with the nebulizer treatments. - We gave one injection of Depo-Medrol today.  - Since you are still having exacerbations with Xolair, we will change you to Berna Bue (sample given today). - [name] will be reaching out to you to get some paperwork filled out for the [facility] - We are going to do some labs to rule out more serious causes of asthma.  - Daily controller medication(s): Fasenra monthly, Singulair 10mg  daily and Symbicort 160/4.70mcg two puffs twice daily with spacer - Prior to physical activity: ProAir 2 puffs 10-15 minutes before physical activity. - Rescue medications: ProAir 4 puffs every 4-6 hours as needed - Asthma control goals:  * Full participation in all desired activities (may need albuterol before activity) * Albuterol use two time or less a week on average (not counting use with activity) * Cough interfering with sleep two time or less a month * Oral steroids no more than once a year * No hospitalizations  2. Seasonal and perennial allergic rhinitis - Continue with allergy injections at the same schedule. - Continue with Xyzal 5mg  daily. - Continue with Flonase two sprays per nostril daily. - Continue with azelastine nasal spray 2 sprays per nostril 1-2 times daily.  3. Acute sinusitis - With your current symptoms and time course, antibiotics are  needed: Augmentin 875mg  twice daily for 10 days - Add on nasal saline spray (i.e., Simply Saline) or nasal saline lavage (i.e., NeilMed) as needed prior to medicated nasal sprays. - For thick post nasal drainage, add guaifenesin [phone] mg (Mucinex) twice daily as needed with adequate hydration as discussed.   4. Return in about 2 months (around 09/01/2017).   Subjective:   [name] is a 41 y.o. female presenting today for follow up of  Chief Complaint  Patient presents with  . Follow-up    Pt presents for follow up of asthma   . Breathing Problem    Pt presents with shortness of breath X 2 days    [name] has a history of the following: Patient Active Problem List   Diagnosis Date Noted  . Perennial and seasonal allergic rhinitis 02/22/2017  . Severe persistent asthma 02/22/2017  . Polyp of nasal cavity 02/22/2017  . Leukocytosis 10/15/2016  . Iron deficiency anemia due to chronic blood loss   . Symptomatic anemia   . Asthma exacerbation 10/11/2016  . Hypokalemia 10/11/2016  . Acute asthma 04/22/2016  . Acute sinusitis, unspecified 03/26/2014  . Essential hypertension 12/08/2013  . 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 Fibroids 08/18/2012  . Menorrhagia 08/18/2012  . Extrinsic asthma  08/15/2011    History obtained from: chart review and patient.  [name] Primary Care Provider is Nolene Ebbs, MD.     Marilynn is a 41 y.o. female presenting for a sick visit. She was last seen in November 2018. At that time,  we diagnosed her with an asthma exacerbation and treated her with DepoMedrol. We did not make any changes to her asthma medications. She seemed to be doing well on Xolair every two weeks for asthma. This seemed to be a one off event since she had not been on her controller medication, therefore I did not think much of it. We did restart her Symbicort, provided samples, and sent in a  prescription. We kept her on her injections as well as her allergy medications.   Since the last visit, she has mostly done OK. She did improve after the last visit and it clears up. But then she contracted a sinus infection which she has been unable to clear with her nasal rinsing regimen. This has interfered with her allergy shots and she has not received any allergy shots since November 2018. She has been using her rescue inhaler multiple times throughout the day; she is using the nebulizer around three times daily. Evidently she is getting some of these prescriptions from her PCP. She also remained on her Symbicort two puffs twice daily. She has been on Spiriva in the past, but did not feel that this provided much relief.   She did get started back on her Symbicort, and she does feel some relief with this. She continues to feel that the Xolair is helping. But when sinuses act up, she tends to have worsening asthma symptoms. She has failed Spiriva in the past. She can take aspirin without a problem. Her last polypectomy was in 2016. She remains on her budesonide rinses. She does not have a problem with exacerbation of her symptoms with NSAIDs.   She was originally placed on Xolair secondary to an elevated IgE. She was no eosinophils during the initial workup. However, it should be noted that she has had an elevated absolute eosinophil count of 800 in October 2017 and 2100 in August 2015.   Otherwise, there have been no changes to her past medical history, surgical history, family history, or social history.    Review of Systems: a 14-point review of systems is pertinent for what is mentioned in HPI.  Otherwise, all other systems were negative. Constitutional: negative other than that listed in the HPI Eyes: negative other than that listed in the HPI Ears, nose, mouth, throat, and face: negative other than that listed in the HPI Respiratory: negative other than that listed in the  HPI Cardiovascular: negative other than that listed in the HPI Gastrointestinal: negative other than that listed in the HPI Genitourinary: negative other than that listed in the HPI Integument: negative other than that listed in the HPI Hematologic: negative other than that listed in the HPI Musculoskeletal: negative other than that listed in the HPI Neurological: negative other than that listed in the HPI Allergy/Immunologic: negative other than that listed in the HPI    Objective:   Blood pressure (!) 148/98, pulse 100, SpO2 98 %. There is no height or weight on file to calculate BMI.   Physical Exam:  General: Alert, interactive, in mild-moderate distress. Able to speak in full sentences, however.  Eyes: No conjunctival injection bilaterally, no discharge on the right, no discharge on the left and no Horner-Trantas dots present. PERRL bilaterally. EOMI without pain. No photophobia.  Ears: Right TM pearly gray with normal light reflex, Left TM pearly gray with normal light reflex, Right TM intact without perforation and Left TM intact without perforation.  Nose/Throat: External nose within normal limits, nasal crease present and septum midline.  Turbinates edematous with clear discharge. Posterior oropharynx erythematous with cobblestoning in the posterior oropharynx. Tonsils 2+ without exudates.  Tongue without thrush. Adenopathy: no enlarged lymph nodes appreciated in the anterior cervical, occipital, axillary, epitrochlear, inguinal, or popliteal regions. Lungs: Decreased breath sounds with expiratory wheezing bilaterally. Increased work of breathing. CV: Normal S1/S2. No murmurs. Capillary refill <2 seconds.  Skin: Warm and dry, without lesions or rashes. Neuro:   Grossly intact. No focal deficits appreciated. Responsive to questions.  Diagnostic studies:  Spirometry: results abnormal (FEV1: 1.21/51%, FVC: 1.56/54%, FEV1/FVC: 78%).    Spirometry consistent with possible  restrictive disease. Xopenex/Atrovent nebulizer treatment given in clinic with significant improvement in FEV1 and FVC per ATS criteria.  Allergy Studies: none     Salvatore Marvel, MD [facility]

## 2017-07-01 NOTE — Progress Notes (Signed)
ERR FOR 80mg  Depo-Medrol

## 2017-07-02 ENCOUNTER — Encounter: Payer: Self-pay | Admitting: Allergy & Immunology

## 2017-07-05 LAB — HYPERSENSITIVITY PNEUMONITIS
A. PULLULANS ABS: NEGATIVE
A.Fumigatus #1 Abs: NEGATIVE
Micropolyspora faeni, IgG: NEGATIVE
PIGEON SERUM ABS: NEGATIVE
THERMOACTINOMYCES VULGARIS IGG: NEGATIVE
Thermoact. Saccharii: NEGATIVE

## 2017-07-05 LAB — ALPHA-1-ANTITRYPSIN: A-1 Antitrypsin: 136 mg/dL (ref 90–200)

## 2017-07-05 LAB — CBC WITH DIFFERENTIAL/PLATELET
Basophils Absolute: 0.1 10*3/uL (ref 0.0–0.2)
Basos: 0 %
EOS (ABSOLUTE): 0.6 10*3/uL — ABNORMAL HIGH (ref 0.0–0.4)
EOS: 5 %
HEMATOCRIT: 33.8 % — AB (ref 34.0–46.6)
Hemoglobin: 10.2 g/dL — ABNORMAL LOW (ref 11.1–15.9)
IMMATURE GRANS (ABS): 0 10*3/uL (ref 0.0–0.1)
IMMATURE GRANULOCYTES: 0 %
LYMPHS: 25 %
Lymphocytes Absolute: 2.9 10*3/uL (ref 0.7–3.1)
MCH: 18.9 pg — ABNORMAL LOW (ref 26.6–33.0)
MCHC: 30.2 g/dL — ABNORMAL LOW (ref 31.5–35.7)
MCV: 63 fL — AB (ref 79–97)
MONOCYTES: 6 %
Monocytes Absolute: 0.7 10*3/uL (ref 0.1–0.9)
Neutrophils Absolute: 7.4 10*3/uL — ABNORMAL HIGH (ref 1.4–7.0)
Neutrophils: 64 %
PLATELETS: 470 10*3/uL — AB (ref 150–379)
RBC: 5.39 x10E6/uL — ABNORMAL HIGH (ref 3.77–5.28)
RDW: 19.3 % — ABNORMAL HIGH (ref 12.3–15.4)
WBC: 11.6 10*3/uL — ABNORMAL HIGH (ref 3.4–10.8)

## 2017-07-12 ENCOUNTER — Telehealth: Payer: Self-pay

## 2017-07-12 DIAGNOSIS — J309 Allergic rhinitis, unspecified: Secondary | ICD-10-CM

## 2017-07-12 NOTE — Telephone Encounter (Signed)
Reviewed note. Let's repeat 0.64mL next week and then advance from there on Schedule B.   Salvatore Marvel, MD [facility] [place]

## 2017-07-12 NOTE — Telephone Encounter (Signed)
Pt came in for allergy injections. She has not received an injection since 05/18/2017. She received 0.25 ml's of her blue vial. Please advise on how to proceed.

## 2017-07-12 NOTE — Progress Notes (Signed)
This encounter was created in error - please disregard.

## 2017-07-14 NOTE — Telephone Encounter (Signed)
Pt advised.

## 2017-07-14 NOTE — Telephone Encounter (Signed)
Added to flowsheet

## 2017-07-23 ENCOUNTER — Other Ambulatory Visit: Payer: Self-pay | Admitting: Obstetrics & Gynecology

## 2017-08-20 ENCOUNTER — Telehealth: Payer: Self-pay | Admitting: *Deleted

## 2017-08-20 NOTE — Telephone Encounter (Signed)
Noted. Thank you, [name]!   Salvatore Marvel, MD [facility] [place] [place]

## 2017-08-20 NOTE — Telephone Encounter (Signed)
Called patient on 07/01/17 to advise paperwork mailed regarding [name] and application to sign and send back.  L/m for patient on 08/01/17 but no response back or paperwork.  Will assume at this time she is not interested in therapy and file her to inactive

## 2017-09-06 ENCOUNTER — Other Ambulatory Visit: Payer: Self-pay | Admitting: Obstetrics & Gynecology

## 2017-09-06 DIAGNOSIS — N920 Excessive and frequent menstruation with regular cycle: Secondary | ICD-10-CM

## 2017-09-12 ENCOUNTER — Other Ambulatory Visit: Payer: Self-pay | Admitting: Obstetrics and Gynecology

## 2017-09-27 ENCOUNTER — Other Ambulatory Visit: Payer: Self-pay | Admitting: Obstetrics & Gynecology

## 2017-09-27 DIAGNOSIS — N920 Excessive and frequent menstruation with regular cycle: Secondary | ICD-10-CM

## 2017-10-17 ENCOUNTER — Other Ambulatory Visit: Payer: Self-pay | Admitting: Obstetrics and Gynecology

## 2017-10-18 ENCOUNTER — Encounter: Payer: Self-pay | Admitting: Allergy & Immunology

## 2017-10-18 ENCOUNTER — Ambulatory Visit (INDEPENDENT_AMBULATORY_CARE_PROVIDER_SITE_OTHER): Payer: Medicare HMO | Admitting: Allergy & Immunology

## 2017-10-18 VITALS — BP 142/88 | HR 98 | Temp 97.6°F | Resp 18

## 2017-10-18 DIAGNOSIS — J330 Polyp of nasal cavity: Secondary | ICD-10-CM

## 2017-10-18 DIAGNOSIS — J3089 Other allergic rhinitis: Secondary | ICD-10-CM

## 2017-10-18 DIAGNOSIS — J4551 Severe persistent asthma with (acute) exacerbation: Secondary | ICD-10-CM | POA: Diagnosis not present

## 2017-10-18 MED ORDER — METHYLPREDNISOLONE ACETATE 80 MG/ML IJ SUSP
80.0000 mg | Freq: Once | INTRAMUSCULAR | Status: AC
  Administered 2017-10-18: 80 mg via INTRAMUSCULAR

## 2017-10-18 MED ORDER — FLUCONAZOLE 150 MG PO TABS: 150.0000 mg | ORAL_TABLET | Freq: Every day | ORAL | 0 refills | Status: DC

## 2017-10-18 NOTE — Patient Instructions (Addendum)
1. Severe persistent asthma with acute exacerbation - Lung testing looked terrible, but it did improve with the nebulizer treatments. - We gave one injection of Depo-Medrol today.  - Paperwork for [employer] provided today.  - Daily controller medication(s): Fasenra monthly x 3 doses (then every 8 weeks thereafter), Singulair 10mg  daily and Symbicort 160/4.30mcg two puffs twice daily with spacer - Prior to physical activity: ProAir 2 puffs 10-15 minutes before physical activity. - Rescue medications: ProAir 4 puffs every 4-6 hours as needed - Asthma control goals:  * Full participation in all desired activities (may need albuterol before activity) * Albuterol use two time or less a week on average (not counting use with activity) * Cough interfering with sleep two time or less a month * Oral steroids no more than once a year * No hospitalizations - Call us at the end of the week if you are not feeling better with the DepoMedrol alone later this week and we can send in an antibiotic.   2. Seasonal and perennial allergic rhinitis - Continue with allergy injections at the same schedule. - Continue with Xyzal 5mg  daily. - Continue with Flonase two sprays per nostril daily. - Continue with azelastine nasal spray 2 sprays per nostril 1-2 times daily.  3. Return in about 1 month (around 11/15/2017).   Please inform us of any Emergency Department visits, hospitalizations, or changes in symptoms. Call us before going to the ED for breathing or allergy symptoms since we might be able to fit you in for a sick visit. Feel free to contact us anytime with any questions, problems, or concerns.  It was a pleasure to see you again today!  Websites that have reliable patient information: 1. American Academy of Asthma, Allergy, and Immunology: www.aaaai.org 2. Food Allergy Research and Education (FARE): foodallergy.org 3. Mothers of Asthmatics: http://www.asthmacommunitynetwork.org 4. American College of  Allergy, Asthma, and Immunology: www.acaai.org

## 2017-10-18 NOTE — Progress Notes (Signed)
FOLLOW UP  Date of Service/Encounter:  10/18/17   Assessment:   Severe persistent asthma with acute exacerbation - initiated Fasenra in December 2018 but then failed to follow through  Nasal polyposis - without NSAID sensitivity  Perennial and seasonal allergic rhinitis    Asthma Reportables:  Severity: severe persistent  Risk: high Control: very poorly controlled   Plan/Recommendations:   1. Severe persistent asthma with acute exacerbation and poor compliance - Lung testing looked terrible, but it did improve with the nebulizer treatments. - We gave one injection of Depo-Medrol today.  - Paperwork for [employer] provided today.  - We did not give another sample today since we provided one in December 2018.  - I did talk to [name] to confirm the paperwork that was needed. - We also emphasized that the need of providing [place] with the [place].  - Daily controller medication(s): Fasenra monthly x 3 doses (then every 8 weeks thereafter), Singulair 10mg  daily and Symbicort 160/4.38mcg two puffs twice daily with spacer - Prior to physical activity: ProAir 2 puffs 10-15 minutes before physical activity. - Rescue medications: ProAir 4 puffs every 4-6 hours as needed - Asthma control goals:  * Full participation in all desired activities (may need albuterol before activity) * Albuterol use two time or less a week on average (not counting use with activity) * Cough interfering with sleep two time or less a month * Oral steroids no more than once a year * No hospitalizations - Call us at the end of the week if you are not feeling better with the DepoMedrol alone later this week and we can send in an antibiotic.   2. Seasonal and perennial allergic rhinitis - Continue with allergy injections at the same schedule. - Continue with Xyzal 5mg  daily. - Continue with Flonase two sprays per nostril daily. - Continue with azelastine nasal spray 2 sprays per  nostril 1-2 times daily.  3. Return in about 1 month (around 11/15/2017).   Subjective:   [name] is a 42 y.o. female presenting today for follow up of  Chief Complaint  Patient presents with  . Asthma    wheezing, S.O.B., and. coughing  . Allergies  . Nasal Congestion    yellow mucus     [name] has a history of the following: Patient Active Problem List   Diagnosis Date Noted  . Perennial and seasonal allergic rhinitis 02/22/2017  . Severe persistent asthma 02/22/2017  . Polyp of nasal cavity 02/22/2017  . Leukocytosis 10/15/2016  . Iron deficiency anemia due to chronic blood loss   . Symptomatic anemia   . Asthma exacerbation 10/11/2016  . Hypokalemia 10/11/2016  . Acute asthma 04/22/2016  . Acute sinusitis, unspecified 03/26/2014  . Essential hypertension 12/08/2013  . 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 Fibroids 08/18/2012  . Menorrhagia 08/18/2012  . Extrinsic asthma  08/15/2011    History obtained from: chart review and patient.  [name] Primary Care Provider is Breanna Ebbs, MD.     [name] is a 42 y.o. female presenting for a follow up visit. She was last seen in December 2018. At that time, she had another exacerbation despite Xolair, therefore we changed her [name]. She actually received both Xolair and Fasenra during that visit in December 2018 as a means of transitioning her. We continued her on Singulair 10mg  daily as well as high dose Symbicort two puffs BID.  For her allergic rhinitis, we continued her on allergen immunotherapy in conjunction with Xyzal, fluticasone, and azelastine nasal spray. We did start her on Augmentin for ten days for sinusitis.   Since the last visit, she did well for a few months. Unfortunately, she has not received another [name] as recommended. Per telephone notes, [name] has reached out to her on a number of occasions and she has not gotten back  to [name]. Therefore her [name] application was put on hold. Evidently. [name] sent out information for [place] for [place] to fill out for financial assistance, but this has not been returned. Evidently, per [name] today, there was some contact information that was wrong in her chart.   She remains on her Symbicort two puffs BID. She has been using her nebulizer machine very often over the last 2-3 weeks with transient improvement. She did feel that the [name] did provide some improvement for around 6-8 weeks before her symptoms returned. She did not tell me today why she did not reach out to [place] when she did not hear anything from [place]. In any case, she is not doing well today and seeks help for worsening asthma symptoms. She has remained afebrile and denies chest pain.   She does report worsening nasal congestion today. She is using her nasal sprays, including budesonide rinses. Unfortunately, she has not received her allergy shots since November 2018 for unknown reasons. She was sick at the end of last year, but for whatever reason she never restarted those allergy shots. She has not noted any worsening symptoms with the use of NSAIDs, but she rarely if ever uses these anyway.   Otherwise, there have been no changes to her past medical history, surgical history, family history, or social history.    Review of Systems: a 14-point review of systems is pertinent for what is mentioned in HPI.  Otherwise, all other systems were negative. Constitutional: negative other than that listed in the HPI Eyes: negative other than that listed in the HPI Ears, nose, mouth, throat, and face: negative other than that listed in the HPI Respiratory: negative other than that listed in the HPI Cardiovascular: negative other than that listed in the HPI Gastrointestinal: negative other than that listed in the HPI Genitourinary: negative other than that listed in the HPI Integument: negative other than that listed in the  HPI Hematologic: negative other than that listed in the HPI Musculoskeletal: negative other than that listed in the HPI Neurological: negative other than that listed in the HPI Allergy/Immunologic: negative other than that listed in the HPI    Objective:   Blood pressure (!) 142/88, pulse 98, temperature 97.6 F (36.4 C), temperature source Oral, resp. rate 18, SpO2 99 %. There is no height or weight on file to calculate BMI.   Physical Exam:  General: Alert, interactive, in mild acute distress.  Eyes: No conjunctival injection bilaterally, no discharge on the right, no discharge on the left, no Horner-Trantas dots present and allergic shiners present bilaterally. PERRL bilaterally. EOMI without pain. No photophobia.  Ears: Right TM pearly gray with normal light reflex, Left TM pearly gray with normal light reflex, Right TM intact without perforation and Left TM intact without perforation.  Nose/Throat: External nose within normal limits and septum midline. Turbinates edematous and pale with clear discharge. Marked polyposis present. Posterior oropharynx markedly erythematous with cobblestoning in the posterior oropharynx. Tonsils 2+ without exudates.  Tongue without thrush. Lungs: Clear to auscultation without wheezing, rhonchi or  rales. No increased work of breathing. CV: Normal S1/S2. No murmurs. Capillary refill <2 seconds.  Skin: Warm and dry, without lesions or rashes. Neuro:   Grossly intact. No focal deficits appreciated. Responsive to questions.  Diagnostic studies:   Spirometry: results abnormal (FEV1: 1.15/50%, FVC: 1.55/55%, FEV1/FVC: 74%).    Spirometry consistent with possible restrictive disease. Albuterol/Atrovent nebulizer treatments given x2 in clinic with significant improvement in FEV1 and FVC per ATS criteria. FEV1 increased 37% while his FVC increased 30%.   Allergy Studies: none     Salvatore Marvel, MD [facility]

## 2017-10-27 ENCOUNTER — Other Ambulatory Visit: Payer: Self-pay | Admitting: Allergy & Immunology

## 2017-11-18 ENCOUNTER — Ambulatory Visit: Payer: Medicare HMO | Admitting: Allergy & Immunology

## 2017-11-24 ENCOUNTER — Ambulatory Visit (INDEPENDENT_AMBULATORY_CARE_PROVIDER_SITE_OTHER): Payer: Medicare HMO | Admitting: Allergy & Immunology

## 2017-11-24 ENCOUNTER — Encounter: Payer: Self-pay | Admitting: Allergy & Immunology

## 2017-11-24 VITALS — BP 122/88 | HR 83 | Temp 98.2°F | Resp 16 | Ht 62.0 in | Wt 263.0 lb

## 2017-11-24 DIAGNOSIS — J330 Polyp of nasal cavity: Secondary | ICD-10-CM | POA: Diagnosis not present

## 2017-11-24 DIAGNOSIS — J3089 Other allergic rhinitis: Secondary | ICD-10-CM

## 2017-11-24 DIAGNOSIS — J4551 Severe persistent asthma with (acute) exacerbation: Secondary | ICD-10-CM | POA: Diagnosis not present

## 2017-11-24 MED ORDER — FLUTICASONE PROPIONATE HFA 220 MCG/ACT IN AERO: 2.0000 | INHALATION_SPRAY | Freq: Two times a day (BID) | RESPIRATORY_TRACT | 5 refills | Status: DC

## 2017-11-24 MED ORDER — METHYLPREDNISOLONE ACETATE 80 MG/ML IJ SUSP
80.0000 mg | Freq: Once | INTRAMUSCULAR | Status: AC
  Administered 2017-11-24: 80 mg via INTRAMUSCULAR

## 2017-11-24 MED ORDER — DOXYCYCLINE HYCLATE 100 MG PO CAPS: 100.0000 mg | ORAL_CAPSULE | Freq: Two times a day (BID) | ORAL | 0 refills | Status: DC

## 2017-11-24 NOTE — Patient Instructions (Addendum)
1. Severe persistent asthma with acute exacerbation - Lung testing looked terrible, but it did improve with the nebulizer treatments. - We gave one injection of Depo-Medrol today (did not give prednisone since she has does not tolerate it) - Since we cannot give prednisone, I would like to start an antibiotic that can help control inflammation as well: doxycycline 100mg  twice daily for 14 days - I talked to [facility] and all that is needed is the Social Security benefit form or W2 for the household (you and your husband)  - You can call [name] if you have more questions at [phone]. - Once we have this information, we can get you started on this medication.  - Add on Flovent 271mcg two puffs twice daily for TWO WEEKS to provide more antiinflammatory activity in the lungs.  - Daily controller medication(s): Fasenra monthly x 3 doses (then every 8 weeks thereafter), Singulair 10mg  daily and Symbicort 160/4.58mcg two puffs twice daily with spacer - Prior to physical activity: ProAir 2 puffs 10-15 minutes before physical activity. - Rescue medications: ProAir 4 puffs every 4-6 hours as needed, albuterol nebulizer one vial every 4-6 hours as needed or DuoNeb nebulizer one vial every 4-6 hours as needed - Changes during respiratory infections or worsening symptoms: Add on Flovent 236mcg 2 puffs twice daily for TWO WEEKS. - Asthma control goals:  * Full participation in all desired activities (may need albuterol before activity) * Albuterol use two time or less a week on average (not counting use with activity) * Cough interfering with sleep two time or less a month * Oral steroids no more than once a year * No hospitalizations  2. Seasonal and perennial allergic rhinitis - We can restart allergy shots once your asthma is under better control.  - Continue with Xyzal 5mg  daily. - Continue with Flonase two sprays per nostril daily. - Continue with azelastine nasal spray 2 sprays per nostril 1-2 times  daily.  3. Return in about 2 weeks (around 12/08/2017).   Please inform us of any Emergency Department visits, hospitalizations, or changes in symptoms. Call us before going to the ED for breathing or allergy symptoms since we might be able to fit you in for a sick visit. Feel free to contact us anytime with any questions, problems, or concerns.  It was a pleasure to see you again today!  Websites that have reliable patient information: 1. American Academy of Asthma, Allergy, and Immunology: www.aaaai.org 2. Food Allergy Research and Education (FARE): foodallergy.org 3. Mothers of Asthmatics: http://www.asthmacommunitynetwork.org 4. American College of Allergy, Asthma, and Immunology: www.acaai.org

## 2017-11-24 NOTE — Progress Notes (Signed)
FOLLOW UP  Date of Service/Encounter:  11/24/17   Assessment:   Severe persistent asthma with acute exacerbation  Perennial and seasonal allergic rhinitis (grasses, weeds, trees, ragweed, molds, cat, dog, cockroach as well as dust mite)  Polyp of nasal cavity - without NSAID sensitivity  History of non-compliance    Asthma Reportables:  Severity: severe persistent  Risk: high Control: very poorly controlled   Plan/Recommendations:   1. Severe persistent asthma with acute exacerbation - Lung testing looked terrible, but it did improve with the nebulizer treatments. - We gave one injection of Depo-Medrol today (did not give prednisone since she has does not tolerate it) - Since we cannot give prednisone, I would like to start an antibiotic that can help control inflammation as well: doxycycline 100mg  twice daily for 14 days - I talked to [facility] and all that is needed is the Social Security benefit form or W2 for the household (you and your husband)  - You can call [name] if you have more questions at [phone]. - Once we have this information, we can get you started on this medication.  - Add on Flovent 233mcg two puffs twice daily for TWO WEEKS to provide more antiinflammatory activity in the lungs.  - Daily controller medication(s): Fasenra monthly x 3 doses (then every 8 weeks thereafter), Singulair 10mg  daily and Symbicort 160/4.94mcg two puffs twice daily with spacer - Prior to physical activity: ProAir 2 puffs 10-15 minutes before physical activity. - Rescue medications: ProAir 4 puffs every 4-6 hours as needed, albuterol nebulizer one vial every 4-6 hours as needed or DuoNeb nebulizer one vial every 4-6 hours as needed - Changes during respiratory infections or worsening symptoms: Add on Flovent 264mcg 2 puffs twice daily for TWO WEEKS. - Asthma control goals:  * Full participation in all desired activities (may need albuterol before activity) * Albuterol use two time  or less a week on average (not counting use with activity) * Cough interfering with sleep two time or less a month * Oral steroids no more than once a year * No hospitalizations  2. Seasonal and perennial allergic rhinitis - We can restart allergy shots once your asthma is under better control.  - Continue with Xyzal 5mg  daily. - Continue with Flonase two sprays per nostril daily. - Continue with azelastine nasal spray 2 sprays per nostril 1-2 times daily.  3. Return in about 2 weeks (around 12/08/2017).  Subjective:   [name] is a 42 y.o. female presenting today for follow up of  Chief Complaint  Patient presents with  . Asthma  . Sinus Problem    [name] has a history of the following: Patient Active Problem List   Diagnosis Date Noted  . Perennial and seasonal allergic rhinitis 02/22/2017  . Severe persistent asthma 02/22/2017  . Polyp of nasal cavity 02/22/2017  . Leukocytosis 10/15/2016  . Iron deficiency anemia due to chronic blood loss   . Symptomatic anemia   . Asthma exacerbation 10/11/2016  . Hypokalemia 10/11/2016  . Acute asthma 04/22/2016  . Acute sinusitis, unspecified 03/26/2014  . Essential hypertension 12/08/2013  . 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 Fibroids 08/18/2012  . Menorrhagia 08/18/2012  . Extrinsic asthma  08/15/2011    History obtained from: chart review and patient.  [name] Primary Care Provider is Nolene Ebbs, MD.     [name] is a 42 y.o. female presenting for a follow up  visit.  She was last seen in April 2019.  At that time, her lung testing looked terrible but did improve with the nebulizer treatment.  Gave her a dose of Depo-Medrol today.  We did not give her a dose of Berna Bue, since she had received a sample in December but never followed up.  We did have her fill out paperwork for [facility]  We continued her on Symbicort 160/4.5 two puffs twice  daily as well as Singulair 10 mg daily.  For her seasonal and perennial rhinitis, we continued her allergy injections, Xyzal 5 mg daily, Flonase 2 sprays per nostril daily, and azelastine 2 sprays per nostril 1-2 times daily.  Since the last visit, she has mostly done well. She seemed to do better after the next visit. However, over the last couple of weeks she has had worsening SOB with wheezing and coughing. She has never received a call from [place] about the [place]. She does endorse compliance with her Symbicort two puffs twice daily. She has been on Spiriva in the past without improvement. She is on her Singulair as well. ACT today is 5 today, indicating very poor control of her asthma symptoms.   She has not restarted her allergy shots since her breathing has not been great. She is on all of her allergy medications, including Xyzal, Flonase, and Astelin.   Otherwise, there have been no changes to her past medical history, surgical history, family history, or social history.    Review of Systems: a 14-point review of systems is pertinent for what is mentioned in HPI.  Otherwise, all other systems were negative. Constitutional: negative other than that listed in the HPI Eyes: negative other than that listed in the HPI Ears, nose, mouth, throat, and face: negative other than that listed in the HPI Respiratory: negative other than that listed in the HPI Cardiovascular: negative other than that listed in the HPI Gastrointestinal: negative other than that listed in the HPI Genitourinary: negative other than that listed in the HPI Integument: negative other than that listed in the HPI Hematologic: negative other than that listed in the HPI Musculoskeletal: negative other than that listed in the HPI Neurological: negative other than that listed in the HPI Allergy/Immunologic: negative other than that listed in the HPI    Objective:   Blood pressure 122/88, pulse 83, temperature 98.2 F (36.8  C), temperature source Oral, resp. rate 16, height 5\' 2"  (1.575 m), weight 263 lb (119.3 kg), last menstrual period 11/20/2017, SpO2 98 %. Body mass index is 48.1 kg/m.   Physical Exam:  General: Alert, interactive, in mild acute distress. Pleasant female.  Eyes: No conjunctival injection bilaterally, no discharge on the right, no discharge on the left and no Horner-Trantas dots present. PERRL bilaterally. EOMI without pain. No photophobia.  Ears: Right TM pearly gray with normal light reflex, Left TM pearly gray with normal light reflex, Right TM intact without perforation and Left TM intact without perforation.  Nose/Throat: External nose within normal limits and septum midline. Turbinates edematous and pale with thick discharge. Posterior oropharynx mildly erythematous with cobblestoning in the posterior oropharynx. Tonsils 2+ without exudates.  Tongue without thrush. Lungs: Decreased breath sounds with expiratory wheezing bilaterally. Increased work of breathing. CV: Normal S1/S2. No murmurs. Capillary refill <2 seconds.  Skin: Warm and dry, without lesions or rashes. Neuro:   Grossly intact. No focal deficits appreciated. Responsive to questions.  Diagnostic studies:   Spirometry: results abnormal (FEV1: 1.03/45%, FVC: 1.48/53%, FEV1/FVC: 70%).  Spirometry consistent with possible restrictive disease. Xopenex/Atrovent nebulizer treatment given in clinic with no improvement.  Allergy Studies: none      Salvatore Marvel, MD  Allergy and [place] [facility] [place]

## 2017-11-25 ENCOUNTER — Ambulatory Visit (INDEPENDENT_AMBULATORY_CARE_PROVIDER_SITE_OTHER): Payer: Medicare HMO | Admitting: Obstetrics & Gynecology

## 2017-11-25 ENCOUNTER — Encounter: Payer: Self-pay | Admitting: Obstetrics & Gynecology

## 2017-11-25 VITALS — BP 135/87 | HR 98 | Ht 62.0 in | Wt 263.3 lb

## 2017-11-25 DIAGNOSIS — N920 Excessive and frequent menstruation with regular cycle: Secondary | ICD-10-CM

## 2017-11-25 DIAGNOSIS — N83202 Unspecified ovarian cyst, left side: Secondary | ICD-10-CM

## 2017-11-25 DIAGNOSIS — D219 Benign neoplasm of connective and other soft tissue, unspecified: Secondary | ICD-10-CM | POA: Diagnosis not present

## 2017-11-25 MED ORDER — NAPROXEN 500 MG PO TABS: ORAL_TABLET | ORAL | 4 refills | Status: DC

## 2017-11-25 MED ORDER — TRANEXAMIC ACID 650 MG PO TABS: ORAL_TABLET | ORAL | 4 refills | Status: DC

## 2017-11-25 NOTE — Patient Instructions (Signed)
Return to clinic for any scheduled appointments or for any gynecologic concerns as needed.   

## 2017-11-25 NOTE — Progress Notes (Signed)
GYNECOLOGY OFFICE VISIT NOTE  History:  42 y.o. G1P0010 here today for discussion about hysterectomy for her menorrhagia. No longer controlled on Lysteda and Naproxen, does not want further hormonal therapy as she has used this in the past. Wants definitive management. She denies any abnormal vaginal discharge, bleeding, pelvic pain or other concerns.   Past Medical History:  Diagnosis Date  . Anemia   . Asthma   . Blood transfusion without reported diagnosis   . Environmental allergies   . Fibroid, uterine   . Fibroids   . GERD (gastroesophageal reflux disease)   . Menorrhagia   . Shortness of breath     Past Surgical History:  Procedure Laterality Date  . CHOLECYSTECTOMY     06/29/2011  . CHOLECYSTECTOMY  06/29/2011   Procedure: LAPAROSCOPIC CHOLECYSTECTOMY WITH INTRAOPERATIVE CHOLANGIOGRAM;  Surgeon: Merrie Roof, MD;  Location: [facility];  Service: General;  Laterality: N/A;  . ECTOPIC PREGNANCY SURGERY    . ERCP  06/30/2011   Procedure: ENDOSCOPIC RETROGRADE CHOLANGIOPANCREATOGRAPHY (ERCP);  Surgeon: Jeryl Columbia, MD;  Location: [facility] ENDOSCOPY;  Service: Endoscopy;  Laterality: N/A;  probable sphincterotomy  . ERCP  07/03/2011   Procedure: ENDOSCOPIC RETROGRADE CHOLANGIOPANCREATOGRAPHY (ERCP);  Surgeon: Landry Dyke, MD;  Location: [facility];  Service: Endoscopy;  Laterality: N/A;  PROPOFOL  . MYOMECTOMY    . MYOMECTOMY N/A 11/14/2012   Procedure: MYOMECTOMY;  Surgeon: Osborne Oman, MD;  Location: [facility];  Service: Gynecology;  Laterality: N/A;  . NASAL POLYP EXCISION  2016    The following portions of the patient's history were reviewed and updated as appropriate: allergies, current medications, past family history, past medical history, past social history, past surgical history and problem list.   Health Maintenance:  Normal pap and negative HRHPV on 11/18/15.  Normal mammogram on 02/05/2017.   Review of Systems:  Pertinent items noted in HPI and remainder of  comprehensive ROS otherwise negative.  Objective:  Physical Exam BP 135/87   Pulse 98   Ht 5\' 2"  (1.575 m)   Wt 263 lb 4.8 oz (119.4 kg)   LMP 11/20/2017 (Exact Date)   BMI 48.16 kg/m  CONSTITUTIONAL: Well-developed, well-nourished female in no acute distress.  NEUROLOGIC: Alert and oriented to person, place, and time. Normal reflexes, muscle tone coordination. No cranial nerve deficit noted. PSYCHIATRIC: Normal mood and affect. Normal behavior. Normal judgment and thought content. CARDIOVASCULAR: Normal heart rate noted RESPIRATORY: Effort and breath sounds normal, no problems with respiration noted ABDOMEN: Soft, no distention noted.   PELVIC: Deferred MUSCULOSKELETAL: Normal range of motion. No edema noted.  Labs and Imaging 04/29/2017 CLINICAL DATA:  Menorrhagia with regular cycle  EXAM: TRANSABDOMINAL AND TRANSVAGINAL ULTRASOUND OF PELVIS  TECHNIQUE: Both transabdominal and transvaginal ultrasound examinations of the pelvis were performed. Transabdominal technique was performed for global imaging of the pelvis including uterus, ovaries, adnexal regions, and pelvic cul-de-sac. It was necessary to proceed with endovaginal exam following the transabdominal exam to visualize the uterus, endometrium, ovaries and adnexa .  COMPARISON:  None  FINDINGS: Uterus  Measurements: Enlarged, 12.3 x 6.6 x 7.8 cm. Multiple fibroids. Posterior right body intramural fibroid measures up to 6.1 cm. Fundal subserosal fibroid measures 4.4 cm maximally. Posterior left lower body intramural fibroid measures 3.5 cm.  Endometrium  Thickness: 6 mm in thickness.  No focal abnormality visualized.  Right ovary  Measurements: 3.2 x 1.6 x 2.6 cm. Normal appearance/no adnexal mass.  Left ovary  Measurements: 5.8 x 3.8 x 6.3  cm. Complex cyst in the left ovary measures 5.6 x 4.8 x 3.0 cm with a few thin internal septations and slightly irregular shape. Findings most compatible  with minimally complex benign ovarian cyst.  Other findings  No abnormal free fluid.  IMPRESSION: Enlarged fibroid uterus with the largest 3 fibroids measured above, measuring up to 6.1 cm.  Minimally complex left ovarian cyst measuring up to 5.6 cm. This most likely reflects benign complex cyst. This could be followed with repeat ultrasound in 3-4 months to ensure resolution.   Electronically Signed   By: Rolm Baptise M.D.   On: 04/29/2017 10:14 Assessment & Plan:  1. Fibroids 2. Menorrhagia with regular cycle Repeat ultrasound ordered; last scan was in 04/2017.  Medications refilled. Goven h/o myomectomy and size of uterus; not a good TVH/LAVH candidate. Offered patient a chance to evaluate for [place] by Dr. Ihor Dow, she agrees to this plan.  Will follow up recommendations. Bleeding precautions reviewed.  - US PELVIS TRANSVANGINAL NON-OB (TV ONLY); Future - tranexamic acid (LYSTEDA) 650 MG TABS tablet; TAKE 2 TABLETS BY MOUTH 3 TIMES A DAY DURING MENSES FOR MAX OF 5 DAYS  Dispense: 30 tablet; Refill: 4 - naproxen (NAPROSYN) 500 MG tablet; TAKE 1 TABLET BY MOUTH TWICE A DAY WITH MEALS AS NEEDED FOR PAIN  Dispense: 60 tablet; Refill: 4  Routine preventative health maintenance measures emphasized. Please refer to After Visit Summary for other counseling recommendations.   Return for Schedule RATH surgery consult with Dr. Ihor Dow.   Total face-to-face time with patient: 15 minutes.  Over 50% of encounter was spent on counseling and coordination of care.   Verita Schneiders, MD, [place] for [employer], [place]

## 2017-11-29 ENCOUNTER — Ambulatory Visit (HOSPITAL_COMMUNITY)
Admission: RE | Admit: 2017-11-29 | Discharge: 2017-11-29 | Disposition: A | Discharge status: Home / Self Care | Payer: Medicare HMO | Source: Ambulatory Visit | Attending: Obstetrics & Gynecology | Admitting: Obstetrics & Gynecology

## 2017-11-29 ENCOUNTER — Telehealth: Payer: Self-pay | Admitting: General Practice

## 2017-11-29 DIAGNOSIS — D219 Benign neoplasm of connective and other soft tissue, unspecified: Secondary | ICD-10-CM

## 2017-11-29 DIAGNOSIS — N83292 Other ovarian cyst, left side: Secondary | ICD-10-CM | POA: Insufficient documentation

## 2017-11-29 DIAGNOSIS — D250 Submucous leiomyoma of uterus: Secondary | ICD-10-CM | POA: Diagnosis not present

## 2017-11-29 DIAGNOSIS — D252 Subserosal leiomyoma of uterus: Secondary | ICD-10-CM | POA: Insufficient documentation

## 2017-11-29 DIAGNOSIS — D251 Intramural leiomyoma of uterus: Secondary | ICD-10-CM | POA: Diagnosis not present

## 2017-11-29 DIAGNOSIS — N920 Excessive and frequent menstruation with regular cycle: Secondary | ICD-10-CM

## 2017-11-29 NOTE — Addendum Note (Signed)
Addended by: Verita Schneiders A on: 11/29/2017 01:20 PM   Modules accepted: Orders

## 2017-11-29 NOTE — Telephone Encounter (Signed)
Called patient to schedule lab appointment for tomorrow, 11/30/17.  Patient voiced understanding.

## 2017-11-30 ENCOUNTER — Other Ambulatory Visit: Payer: Medicare HMO

## 2017-11-30 DIAGNOSIS — N83202 Unspecified ovarian cyst, left side: Secondary | ICD-10-CM

## 2017-12-01 LAB — CA 125: Cancer Antigen (CA) 125: 24.7 U/mL (ref 0.0–38.1)

## 2017-12-08 ENCOUNTER — Ambulatory Visit (INDEPENDENT_AMBULATORY_CARE_PROVIDER_SITE_OTHER): Payer: Medicare HMO | Admitting: Allergy & Immunology

## 2017-12-08 ENCOUNTER — Encounter: Payer: Self-pay | Admitting: Allergy & Immunology

## 2017-12-08 VITALS — BP 120/82 | HR 97 | Temp 98.2°F | Resp 18 | Ht 62.0 in | Wt 261.6 lb

## 2017-12-08 DIAGNOSIS — J4551 Severe persistent asthma with (acute) exacerbation: Secondary | ICD-10-CM | POA: Diagnosis not present

## 2017-12-08 DIAGNOSIS — Z9114 Patient's other noncompliance with medication regimen: Secondary | ICD-10-CM

## 2017-12-08 DIAGNOSIS — J3089 Other allergic rhinitis: Secondary | ICD-10-CM

## 2017-12-08 DIAGNOSIS — J330 Polyp of nasal cavity: Secondary | ICD-10-CM | POA: Diagnosis not present

## 2017-12-08 MED ORDER — METHYLPREDNISOLONE ACETATE 80 MG/ML IJ SUSP
80.0000 mg | Freq: Once | INTRAMUSCULAR | Status: AC
  Administered 2017-12-08: 80 mg via INTRAMUSCULAR

## 2017-12-08 MED ORDER — FLUTICASONE PROPIONATE HFA 220 MCG/ACT IN AERO: 2.0000 | INHALATION_SPRAY | Freq: Two times a day (BID) | RESPIRATORY_TRACT | 5 refills | Status: DC

## 2017-12-08 NOTE — Patient Instructions (Addendum)
1. Severe persistent asthma with acute exacerbation - Lung testing looked terrible, but it did improve with the nebulizer treatments. - We gave one injection of Depo-Medrol today (did not give prednisone since she has does not tolerate it) - The Social Security benefit form or W2 for the household (you and your husband) has been faxed to our biologics coordinator as of today. - You can call [name] if you have more questions at [phone]. - Once we have this information, we can get you started on this medication.  - Add on Flovent 263mcg two puffs twice daily for TWO WEEKS to provide more antiinflammatory activity in the lungs.  - Daily controller medication(s): Fasenra monthly x 3 doses (then every 8 weeks thereafter), Singulair 10mg  daily and Symbicort 160/4.60mcg two puffs twice daily with spacer - Prior to physical activity: ProAir 2 puffs 10-15 minutes before physical activity. - Rescue medications: ProAir 4 puffs every 4-6 hours as needed, albuterol nebulizer one vial every 4-6 hours as needed or DuoNeb nebulizer one vial every 4-6 hours as needed  - Asthma control goals:  * Full participation in all desired activities (may need albuterol before activity) * Albuterol use two time or less a week on average (not counting use with activity) * Cough interfering with sleep two time or less a month * Oral steroids no more than once a year * No hospitalizations  2. Seasonal and perennial allergic rhinitis - We can restart allergy shots once your asthma is under better control.  - Continue with Xyzal 5mg  daily. - Continue with Flonase two sprays per nostril daily. - Continue with azelastine nasal spray 2 sprays per nostril 1-2 times daily.  3. Follow up in 2 weeks or sooner if needed  Please inform us of any Emergency Department visits, hospitalizations, or changes in symptoms. Call us before going to the ED for breathing or allergy symptoms since we might be able to fit you in for a sick  visit. Feel free to contact us anytime with any questions, problems, or concerns.  It was a pleasure to meet you today!  Websites that have reliable patient information: 1. American Academy of Asthma, Allergy, and Immunology: www.aaaai.org 2. Food Allergy Research and Education (FARE): foodallergy.org 3. Mothers of Asthmatics: http://www.asthmacommunitynetwork.org 4. American College of Allergy, Asthma, and Immunology: www.acaai.org

## 2017-12-08 NOTE — Progress Notes (Signed)
[address] [place] [address] Dept: [phone]  FOLLOW UP NOTE  Patient ID: [name], female    DOB: 31-Jan-1976  Age: 42 y.o. MRN: [id number] Date of Office Visit: 12/08/2017  Assessment  Chief Complaint: Follow-up  HPI [name] is a 42 year old female who presents to the clinic for a follow up visit. She was last seen in this clinic on 11/24/2017 by Dr. Ernst Bowler for evaluation of asthma with acute exacerbation, allergic rhinitis, and nasal polyposis. At that time, she received a DepoMedrol injection, Doxycycline, and Flovent 220- 2 puffs twice a day was added to her medication regimen. She has, in the past, tried Xolair without significant improvement and was given a sample of Fasenra in December with reported improvement of asthma symptoms.   At today's visit, she reports she continues to have shortness of breath, wheezing, and cough with activity and rest. She reports the cough as occasionally productive with thick yellow sputum. She is currently using Symbicort 160- 2 puffs twice a day with a spacer and montelukast 10 mg once a day. She did not start using Flovent 220 after her last visit. She was able to provide the Social Security form or W2 for the household to our [place] in order to move forward with [employer].   Allergic rhinitis is reported as not well controlled with symptoms including nasal congestion, rhinorrhea which is thick and yellow, and thick post nasal drip. She reports the nasal drainage has changed in color with the administration of Doxycycline and DepoMedrol at the last visit. She reports using Veramist nasal spray as needed and Astelin nasal spray once a day.   Her current medications are listed in the chart.  Drug Allergies:  Allergies  Allergen Reactions  . Bee Venom Anaphylaxis    Patient has an epi pen, also has not been stung before but was informed she is allergic  . Prednisone Hives and Swelling    Patient has been  rechallenged with prednisone and experienced the same symptoms of hives and face swelling.  She can tolerate methylprednisolone IV and PO.    Physical Exam: BP 120/82   Pulse 97   Temp 98.2 F (36.8 C) (Oral)   Resp 18   Ht 5\' 2"  (1.575 m)   Wt 261 lb 9.6 oz (118.7 kg)   LMP 11/20/2017 (Exact Date)   SpO2 95%   BMI 47.85 kg/m    Physical Exam  Constitutional: She is oriented to person, place, and time. She appears well-developed and well-nourished.  HENT:  Head: Normocephalic and atraumatic.  Right Ear: External ear normal.  Left Ear: External ear normal.  Bilateral nares edematous, right greater than left, and pale with thick nasal drainage noted. Pharynx edematous with cobblestoning present.   Eyes: Conjunctivae are normal.  Neck: Normal range of motion. Neck supple.  Cardiovascular: Normal rate, regular rhythm and normal heart sounds.  No murmur noted.  Pulmonary/Chest:  Scattered inspiratory and expiratory wheezes noted. Decreased air movement noted bilaterally. Wheezing decreased and air movement increased after 3 duoneb treatments.   Musculoskeletal: Normal range of motion.  Neurological: She is alert and oriented to person, place, and time.  Skin: Skin is warm and dry.  Psychiatric: She has a normal mood and affect. Her behavior is normal. Judgment and thought content normal.    Diagnostics: FVC 1.19, FEV1 0.76. Predicted FVC 2.81, predicted FEV1 2.31. Spirometry reveals severe restriction and mild airway obstruction. Post bronchodilator spirometry FVC 1.60, FEV1  1.24. Spirometry indicates marked improvement in FVC and FEV1. After additional bronchodilator therapy FVC 1.74 and FEV1 1.38.   Assessment and Plan: 1. Severe persistent asthma with acute exacerbation   2. Polyp of nasal cavity   3. Perennial and seasonal allergic rhinitis     Meds ordered this encounter  Medications  . fluticasone (FLOVENT HFA) 220 MCG/ACT inhaler    Sig: Inhale 2 puffs into the lungs  2 (two) times daily.    Dispense:  1 Inhaler    Refill:  5   1. Severe persistent asthma with acute exacerbation - Lung testing looked terrible, but it did improve with the nebulizer treatments. - We gave one injection of Depo-Medrol today (did not give prednisone since she has does not tolerate it) - The Social Security benefit form or W2 for the household (you and your husband) has been faxed to our biologics coordinator as of today. - You can call [name] if you have more questions at [phone]. - Once we have this information, we can get you started on this medication.  - Add on Flovent 257mcg two puffs twice daily for TWO WEEKS to provide more antiinflammatory activity in the lungs.  - Daily controller medication(s): Fasenra monthly x 3 doses (then every 8 weeks thereafter), Singulair 10mg  daily and Symbicort 160/4.54mcg two puffs twice daily with spacer - Prior to physical activity: ProAir 2 puffs 10-15 minutes before physical activity. - Rescue medications: ProAir 4 puffs every 4-6 hours as needed, albuterol nebulizer one vial every 4-6 hours as needed or DuoNeb nebulizer one vial every 4-6 hours as needed - Due to frequent prednisone administration, she will need close follow up with her primary care provider.   - Asthma control goals:  * Full participation in all desired activities (may need albuterol before activity) * Albuterol use two time or less a week on average (not counting use with activity) * Cough interfering with sleep two time or less a month * Oral steroids no more than once a year * No hospitalizations  2. Seasonal and perennial allergic rhinitis - We can restart allergy shots once your asthma is under better control.  - Continue with Xyzal 5mg  daily. - Continue with Flonase two sprays per nostril daily. - Continue with azelastine nasal spray 2 sprays per nostril 1-2 times daily.  3. Follow up in 2 weeks or sooner if needed   Return in about 2 weeks (around  12/22/2017), or if symptoms worsen or fail to improve.    Thank you for the opportunity to care for this patient.  Please do not hesitate to contact me with questions.  Gareth Morgan, FNP Allergy and [place] [facility] [place]

## 2017-12-20 ENCOUNTER — Encounter (HOSPITAL_COMMUNITY): Payer: Self-pay

## 2017-12-20 ENCOUNTER — Ambulatory Visit (INDEPENDENT_AMBULATORY_CARE_PROVIDER_SITE_OTHER): Payer: Medicare HMO | Admitting: Obstetrics & Gynecology

## 2017-12-20 ENCOUNTER — Other Ambulatory Visit: Payer: Self-pay

## 2017-12-20 ENCOUNTER — Encounter: Payer: Self-pay | Admitting: Obstetrics & Gynecology

## 2017-12-20 VITALS — BP 153/97 | HR 88 | Wt 266.0 lb

## 2017-12-20 DIAGNOSIS — N939 Abnormal uterine and vaginal bleeding, unspecified: Secondary | ICD-10-CM

## 2017-12-20 DIAGNOSIS — N83202 Unspecified ovarian cyst, left side: Secondary | ICD-10-CM

## 2017-12-20 DIAGNOSIS — D500 Iron deficiency anemia secondary to blood loss (chronic): Secondary | ICD-10-CM | POA: Diagnosis not present

## 2017-12-20 MED ORDER — INTEGRA F 125-1 MG PO CAPS
1.0000 | ORAL_CAPSULE | Freq: Every day | ORAL | 1 refills | Status: DC
Start: 2017-12-20 — End: 2018-02-10

## 2017-12-20 MED ORDER — MEGESTROL ACETATE 40 MG PO TABS: 40.0000 mg | ORAL_TABLET | Freq: Two times a day (BID) | ORAL | 5 refills | Status: DC

## 2017-12-20 NOTE — Progress Notes (Signed)
History:  42 y.o. G1P0010 here today for eval of sx uterine fibroids and ov cyst. Pt is sent for consideration of [place] with ovarian cystectomy vs oophorectomy. Pt is s/p full eval from Dr. Harolyn Rutherford for hyst but, wants to discuss the route of the hsyt.    The following portions of the patient's history were reviewed and updated as appropriate: allergies, current medications, past family history, past medical history, past social history, past surgical history and problem list.  Review of Systems:  Pertinent items are noted in HPI.    Objective:  Physical Exam Blood pressure (!) 153/97, pulse 88, weight 266 lb (120.7 kg), last menstrual period 11/20/2017.  CONSTITUTIONAL: Well-developed, well-nourished female in no acute distress.  HENT:  Normocephalic, atraumatic EYES: Conjunctivae and EOM are normal. No scleral icterus.  NECK: Normal range of motion SKIN: Skin is warm and dry. No rash noted. Not diaphoretic.No pallor. [place]: Alert and oriented to person, place, and time. Normal coordination.  Abd: Soft, nontender and nondistended; obese; 2 well healed transverse incisions. A mass effect is palpable above the umbilicus.  Pelvic: Normal appearing external genitalia; normal appearing vaginal mucosa and cervix.  There is a large amount of blood in the vaginal vault.   The uterus is enlarged. It is difficult to appreciate the difference between the uterus and the left adnexal mass due to the size of the lesion and body habitus. .  Labs and Imaging US Pelvis Transvanginal Non-ob (tv Only)  Result Date: 11/29/2017 CLINICAL DATA:  Fibroids, menorrhagia, LEFT ovarian cyst EXAM: TRANSABDOMINAL AND TRANSVAGINAL ULTRASOUND OF PELVIS TECHNIQUE: Both transabdominal and transvaginal ultrasound examinations of the pelvis were performed. Transabdominal technique was performed for global imaging of the pelvis including uterus, ovaries, adnexal regions, and pelvic cul-de-sac. It was necessary to proceed with  endovaginal exam following the transabdominal exam to visualize the RIGHT adnexa and to characterize a LEFT ovarian cyst. COMPARISON:  07/25/2012 FINDINGS: Uterus Measurements: 12.9 x 5.9 x 7.5 cm. At least 3 uterine leiomyomata are identified. Large anterior leiomyoma at upper uterine segment, subserosal, 4.2 x 3.3 x 4.1 cm. Second anterior leiomyoma at mid uterus on LEFT, 3.7 x 2.5 x 2.9 cm, submucosal. Third mass anterior mid uterus, likely intramural, 2.5 x 1.3 x 1.7 cm, markedly echogenic, question lipoleiomyoma. Endometrium Thickness: 4.6 mm.  No endometrial fluid or focal abnormality Right ovary Measurements: 4.4 x 2.1 x 2.0 cm.  Normal morphology without mass Left ovary Measurements: 11.8 x 11.3 x 9.0 cm. Large cyst 11.9 x 11.3 x 8.9 cm replacing LEFT ovary, with lesion demonstrating slightly irregular margins and question minimal mural nodularity. Other findings No endometrial fluid. Hypoechoic somewhat tubular structure partially visualized in RIGHT adnexa, question segment of hydrosalpinx. IMPRESSION: Multiple uterine leiomyomata, largest 4.2 cm anterior upper uterus, with the smaller lesion 2.5 cm greatest size uniformly echogenic question lipoleiomyoma. Large mildly complicated LEFT ovarian cyst 11.9 cm greatest size. Consider surgical evaluation. This recommendation follows the consensus statement: Management of Asymptomatic Ovarian and Other Adnexal Cysts Imaged at [place]: Society of Radiologists in [place]. Radiology 2010; [phone]. Electronically Signed   By: Lavonia Dana M.D.   On: 11/29/2017 12:58    Assessment & Plan:  Sx uterine fibroids- no intermenstrual bleeding Large left adnexal mass- normal CA125  Due to the left adnexal mass I do not believe that the pt is a good candidate for a robotic assisted procedure. She is welcome to a second opinion but, she feels comfortable proceeding to an open hyst  with removal of the left ovary.   Patient desires  surgical management with TAH, LSO and right salpingectomy.  The risks of surgery were discussed in detail with the patient including but not limited to: bleeding which may require transfusion or reoperation; infection which may require prolonged hospitalization or re-hospitalization and antibiotic therapy; injury to bowel, bladder, ureters and major vessels or other surrounding organs; need for additional procedures including laparotomy; thromboembolic phenomenon, incisional problems and other postoperative or anesthesia complications.  Patient was told that the likelihood that her condition and symptoms will be treated effectively with this surgical management was very high; the postoperative expectations were also discussed in detail. The patient also understands the alternative treatment options which were discussed in full. All questions were answered.  She was told that she will be contacted by our surgical scheduler regarding the time and date of her surgery; routine preoperative instructions of having nothing to eat or drink after midnight on the day prior to surgery and also coming to the hospital 1 1/2 hours prior to her time of surgery were also emphasized.  She was told she may be called for a preoperative appointment about a week prior to surgery and will be given further preoperative instructions at that visit. Printed patient education handouts about the procedure were given to the patient to review at home.  Megace 40mg  bid Integra 1 po q day  Total face-to-face time with patient was 69min.  Greater than 50% was spent in counseling and coordination of care with the patient.   Avion Patella L. Harraway-Smith, M.D., Cherlynn June

## 2017-12-20 NOTE — Patient Instructions (Signed)
Abdominal Hysterectomy °Abdominal hysterectomy is a surgical procedure to remove the womb (uterus). The uterus is the muscular organ that houses a developing baby. This surgery may be done if: °· You have cancer. °· You have growths (tumors or fibroids) in the uterus. °· You have long-term (chronic) pain. °· You are bleeding. °· Your uterus has slipped down into your vagina (uterine prolapse). °· You have a condition in which the tissue that lines the uterus grows outside of its normal location (endometriosis). °· You have an infection in your uterus. °· You are having problems with your menstrual cycle. ° °Depending on why you are having this procedure, you may also have other reproductive organs removed. These could include: °· The part of your vagina that connects with your uterus (cervix). °· The organs that make eggs (ovaries). °· The tubes that connect the ovaries to the uterus (fallopian tubes). ° °Tell a health care provider about: °· Any allergies you have. °· All medicines you are taking, including vitamins, herbs, eye drops, creams, and over-the-counter medicines. °· Any problems you or family members have had with anesthetic medicines. °· Any blood disorders you have. °· Any surgeries you have had. °· Any medical conditions you have. °· Whether you are pregnant or may be pregnant. °What are the risks? °Generally, this is a safe procedure. However, problems may occur, including: °· Bleeding. °· Infection. °· Allergic reactions to medicines or dyes. °· Damage to other structures or organs. °· Nerve injury. °· Decreased interest in sex or pain during sex. °· Blood clots that can break free and travel to your lungs. ° °What happens before the procedure? °Staying hydrated °Follow instructions from your health care provider about hydration, which may include: °· Up to 2 hours before the procedure - you may continue to drink clear liquids, such as water, clear fruit juice, black coffee, and plain tea ° °Eating  and drinking restrictions °Follow instructions from your health care provider about eating and drinking, which may include: °· 8 hours before the procedure - stop eating heavy meals or foods such as meat, fried foods, or fatty foods. °· 6 hours before the procedure - stop eating light meals or foods, such as toast or cereal. °· 6 hours before the procedure - stop drinking milk or drinks that contain milk. °· 2 hours before the procedure - stop drinking clear liquids. ° °Medicines °· Ask your health care provider about: °? Changing or stopping your regular medicines. This is especially important if you are taking diabetes medicines or blood thinners. °? Taking medicines such as aspirin and ibuprofen. These medicines can thin your blood. Do not take these medicines before your procedure if your health care provider instructs you not to. °· You may be given antibiotic medicine to help prevent infection. Take it as told by your health care provider. °· You may be asked to take laxatives to prevent constipation. °General instructions °· Ask your health care provider how your surgical site will be marked or identified. °· You may be asked to shower with a germ-killing soap. °· Plan to have someone take you home from the hospital. °· Do not use any products that contain nicotine or tobacco, such as cigarettes and e-cigarettes. If you need help quitting, ask your health care provider. °· You may have an exam or testing. °· You may have a blood or urine sample taken. °· You may need to have an enema to clean out your rectum and lower colon. °· This   procedure can affect the way you feel about yourself. Talk to your health care provider about the physical and emotional changes this procedure may cause. What happens during the procedure?  To lower your risk of infection: ? Your health care team will wash or sanitize their hands. ? Your skin will be washed with soap. ? Hair may be removed from the surgical area.  An IV  tube will be inserted into one of your veins.  You will be given one or more of the following: ? A medicine to help you relax (sedative). ? A medicine to make you fall asleep (general anesthetic).  Tight-fitting (compression) stockings will be placed on your legs to promote circulation.  A thin, flexible tube (catheter) will be inserted to help drain your urine.  The surgeon will make a cut (incision) through the skin in your lower belly. The incision may go side-to-side or up-and-down.  The surgeon will move aside the body tissue that covers your uterus. The surgeon will then carefully take out your uterus along with any of the other organs that need to be removed.  Bleeding will be controlled with clamps or sutures.  The surgeon will close your incision with stitches (sutures), skin glue, or adhesive strips.  A bandage (dressing) will be placed over the incision. The procedure may vary among health care providers and hospitals. What happens after the procedure?  You will be given pain medicine as needed.  Your blood pressure, heart rate, breathing rate, and blood oxygen level will be monitored until the medicines you were given have worn off.  You will need to stay in the hospital to recover for one to two days. Ask your health care provider how long you will need to stay in the hospital after your procedure.  You may have a liquid diet at first. You will most likely return to your usual diet the day after surgery.  You will still have the urinary catheter in place. It will likely be removed the day after surgery.  You may have to wear compression stockings. These stockings help to prevent blood clots and reduce swelling in your legs.  You will be encouraged to walk as soon as possible. You will also use a device or do breathing exercises to keep your lungs clear.  You may need to use a sanitary napkin for vaginal discharge. Summary  Abdominal hysterectomy is a surgical  procedure to remove the womb (uterus). The uterus is the muscular organ that houses a developing baby.  This procedure can affect the way you feel about yourself. Talk to your health care provider about the physical and emotional changes this procedure may cause.  You will be given medicines for pain after the procedure.  You will need to stay in the hospital to recover. Ask your health care provider how long you will need to stay in the hospital after your procedure. This information is not intended to replace advice given to you by your health care provider. Make sure you discuss any questions you have with your health care provider. Document Released: 07/04/2013 Document Revised: 06/17/2016 Document Reviewed: 06/17/2016 Elsevier Interactive Patient Education  2017 [employer]. Abdominal Hysterectomy, Care After This sheet gives you information about how to care for yourself after your procedure. Your doctor may also give you more specific instructions. If you have problems or questions, contact your doctor. Follow these instructions at home: Bathing  Do not take baths, swim, or use a hot tub until your doctor  says it is okay. Ask your doctor if you can take showers. You may only be allowed to take sponge baths for bathing.  Keep the bandage (dressing) dry until your doctor says it can be taken off. Surgical cut ( incision) care  Follow instructions from your doctor about how to take care of your cut from surgery. Make sure you: ? Wash your hands with soap and water before you change your bandage (dressing). If you cannot use soap and water, use hand sanitizer. ? Change your bandage as told by your doctor. ? Leave stitches (sutures), skin glue, or skin tape (adhesive) strips in place. They may need to stay in place for 2 weeks or longer. If tape strips get loose and curl up, you may trim the loose edges. Do not remove tape strips completely unless your doctor says it is okay.  Check  your surgical cut area every day for signs of infection. Check for: ? Redness, swelling, or pain. ? Fluid or blood. ? Warmth. ? Pus or a bad smell. Activity  Do gentle, daily exercise as told by your doctor. You may be told to take short walks every day and go farther each time.  Do not lift anything that is heavier than 10 lb (4.5 kg), or the limit that your doctor tells you, until he or she says that it is safe.  Do not drive or use heavy machinery while taking prescription pain medicine.  Do not drive for 24 hours if you were given a medicine to help you relax (sedative).  Follow your doctor's advice about exercise, driving, and general activities. Ask your doctor what activities are safe for you. Lifestyle  Do not douche, use tampons, or have sex for at least 6 weeks or as told by your doctor.  Do not drink alcohol until your doctor says it is okay.  Drink enough fluid to keep your pee (urine) clear or pale yellow.  Try to have someone at home with you for the first 1-2 weeks to help.  Do not use any products that contain nicotine or tobacco, such as cigarettes and e-cigarettes. These can slow down healing. If you need help quitting, ask your doctor. General instructions  Take over-the-counter and prescription medicines only as told by your doctor.  Do not take aspirin or ibuprofen. These medicines can cause bleeding.  To prevent or treat constipation while you are taking prescription pain medicine, your doctor may suggest that you: ? Drink enough fluid to keep your urine clear or pale yellow. ? Take over-the-counter or prescription medicines. ? Eat foods that are high in fiber, such as:  Fresh fruits and vegetables.  Whole grains.  Beans. ? Limit foods that are high in fat and processed sugars, such as fried and sweet foods.  Keep all follow-up visits as told by your doctor. This is important. Contact a doctor if:  You have chills or fever.  You have redness,  swelling, or pain around your cut.  You have fluid or blood coming from your cut.  Your cut feels warm to the touch.  You have pus or a bad smell coming from your cut.  Your cut breaks open.  You feel dizzy or light-headed.  You have pain or bleeding when you pee.  You keep having watery poop (diarrhea).  You keep feeling sick to your stomach (nauseous) or keep throwing up (vomiting).  You have unusual fluid (discharge) coming from your vagina.  You have a rash.  You have  a reaction to your medicine.  Your pain medicine does not help. Get help right away if:  You have a fever and your symptoms get worse all of a sudden.  You have very bad belly (abdominal) pain.  You are short of breath.  You pass out (faint).  You have pain, swelling, or redness of your leg.  You bleed a lot from your vagina and notice clumps of blood (clots). Summary  Do not take baths, swim, or use a hot tub until your doctor says it is okay. Ask your doctor if you can take showers. You may only be allowed to take sponge baths for bathing.  Follow your doctor's advice about exercise, driving, and general activities. Ask your doctor what activities are safe for you.  Do not lift anything that is heavier than 10 lb (4.5 kg), or the limit that your doctor tells you, until he or she says that it is safe.  Try to have someone at home with you for the first 1-2 weeks to help. This information is not intended to replace advice given to you by your health care provider. Make sure you discuss any questions you have with your health care provider. Document Released: 04/07/2008 Document Revised: 06/17/2016 Document Reviewed: 06/17/2016 Elsevier Interactive Patient Education  2017 [employer].

## 2017-12-23 ENCOUNTER — Ambulatory Visit (INDEPENDENT_AMBULATORY_CARE_PROVIDER_SITE_OTHER): Payer: Medicare HMO | Admitting: Allergy & Immunology

## 2017-12-23 ENCOUNTER — Encounter: Payer: Self-pay | Admitting: Allergy & Immunology

## 2017-12-23 VITALS — BP 130/84 | HR 68 | Resp 16

## 2017-12-23 DIAGNOSIS — J330 Polyp of nasal cavity: Secondary | ICD-10-CM

## 2017-12-23 DIAGNOSIS — J302 Other seasonal allergic rhinitis: Secondary | ICD-10-CM

## 2017-12-23 DIAGNOSIS — J3089 Other allergic rhinitis: Secondary | ICD-10-CM | POA: Diagnosis not present

## 2017-12-23 DIAGNOSIS — Z9114 Patient's other noncompliance with medication regimen: Secondary | ICD-10-CM | POA: Diagnosis not present

## 2017-12-23 DIAGNOSIS — J4550 Severe persistent asthma, uncomplicated: Secondary | ICD-10-CM | POA: Diagnosis not present

## 2017-12-23 MED ORDER — BENRALIZUMAB 30 MG/ML ~~LOC~~ SOSY
30.0000 mg | PREFILLED_SYRINGE | SUBCUTANEOUS | Status: AC
  Administered 2017-12-23 – 2018-03-07 (×3): 30 mg via SUBCUTANEOUS

## 2017-12-23 NOTE — Progress Notes (Signed)
Immunotherapy   Patient Details  Name: [name] MRN: [id number] Date of Birth: 08/31/1975  12/23/2017  Cephus Shelling started injections for  Fasenra 30 mg every 28 days for 3 doses and then every 8 weeks.  Epi-Pen:Epi-Pen Available  Consent signed and patient instructions given. No problem after 30 minutes in the office.   Horris Latino 12/23/2017, 6:53 PM

## 2017-12-23 NOTE — Patient Instructions (Addendum)
1. Severe persistent asthma - You looked great today!   - Berna Bue given today, which will be monthly for two more doses before spacing out.  - Go ahead and stop the Flovent today and use it only when your symptoms start to flare.  - Daily controller medication(s): Fasenra monthly x 3 doses (then every 8 weeks thereafter), Singulair 10mg  daily and Symbicort 160/4.84mcg two puffs twice daily with spacer - Prior to physical activity: ProAir 2 puffs 10-15 minutes before physical activity. - Rescue medications: ProAir 4 puffs every 4-6 hours as needed, albuterol nebulizer one vial every 4-6 hours as needed or DuoNeb nebulizer one vial every 4-6 hours as needed  - During flares: add on Flovent 239mcg two puffs twice daily for 1-2 weeks - Asthma control goals:  * Full participation in all desired activities (may need albuterol before activity) * Albuterol use two time or less a week on average (not counting use with activity) * Cough interfering with sleep two time or less a month * Oral steroids no more than once a year * No hospitalizations  2. Seasonal and perennial allergic rhinitis - Come back on Monday to restart your allergy shots.  - Continue with Xyzal 5mg  daily. - Continue with Flonase two sprays per nostril daily. - Continue with azelastine nasal spray 2 sprays per nostril 1-2 times daily.  3. Return in about 1 month (around 01/20/2018).   [place] of any Emergency Department visits, hospitalizations, or changes in symptoms. Call us before going to the ED for breathing or allergy symptoms since we might be able to fit you in for a sick visit. Feel free to contact us anytime with any questions, problems, or concerns.  It was a pleasure to see you again today, my Happy Wheezer!   Websites that have reliable patient information: 1. American Academy of Asthma, Allergy, and Immunology: www.aaaai.org 2. Food Allergy Research and Education (FARE): foodallergy.org 3. Mothers of Asthmatics:  http://www.asthmacommunitynetwork.org 4. American College of Allergy, Asthma, and Immunology: MonthlyElectricBill.co.uk   Make sure you are registered to vote!

## 2017-12-23 NOTE — Progress Notes (Signed)
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 She is here today for [facility] and for a check up. Overall she has done well with the regimen, likely secondary to her avoidance measures of her allergic triggers. It seems that she has actually been compliant with her medications at this time. She continues to wheeze throughout, but is a comfortable wheezer. She declines spirometry today. We administered Berna Bue and we will see her again in one month when she gets her next Fasenra dose.    Plan/Recommendations:   1. Severe persistent asthma - Although she was wheezing today, she appeared much more comfortable than when I have seen her the last few times.  - She declined a breathing test today.  Berna Bue given today, which will be monthly for two more doses before spacing out.  - Daily controller medication(s): Fasenra monthly x 3 doses (then every 8 weeks thereafter), Singulair 10mg  daily and Symbicort 160/4.69mcg two puffs twice daily with spacer - Prior to physical activity: ProAir 2 puffs 10-15 minutes before physical activity. - Rescue medications: ProAir 4 puffs every 4-6 hours as needed, albuterol nebulizer one vial every 4-6 hours as needed or DuoNeb nebulizer one vial every 4-6 hours as needed  - During flares: add on Flovent 216mcg two puffs twice daily for 1-2 weeks - Asthma control goals:  * Full participation in all desired activities (may need albuterol before activity) * Albuterol use two time or less a week on average (not counting use with activity) * Cough interfering with sleep two time or less a month * Oral steroids no more than once a year * No hospitalizations  2. Seasonal and perennial allergic  rhinitis - Come back on Monday to restart your allergy shots.  - Continue with Xyzal 5mg  daily. - Continue with Flonase two sprays per nostril daily. - Continue with azelastine nasal spray 2 sprays per nostril 1-2 times daily.  3. Return in about 1 month (around 01/20/2018).  Subjective:   [name] is a 42 y.o. female presenting today for follow up of  Chief Complaint  Patient presents with  . Asthma    doing ok     [name] has a history of the following: Patient Active Problem List   Diagnosis Date Noted  . Non compliance w medication regimen 12/08/2017  . Perennial and seasonal allergic rhinitis 02/22/2017  . Severe persistent asthma 02/22/2017  . Polyp of nasal cavity 02/22/2017  . Leukocytosis 10/15/2016  . Iron deficiency anemia due to chronic blood loss   . Symptomatic anemia   . Asthma exacerbation 10/11/2016  . Hypokalemia 10/11/2016  . Acute asthma 04/22/2016  . Acute sinusitis, unspecified 03/26/2014  . Essential hypertension 12/08/2013  . 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 Fibroids 08/18/2012  . Menorrhagia 08/18/2012  . Extrinsic asthma  08/15/2011    History obtained from: chart review and patient.  [name] Primary Care Provider is Nolene Ebbs, MD.     [name] is a 42 y.o. female presenting for a follow up visit.  She was last seen in May 2018.  At that time, she was experiencing an asthma exacerbation.  We gave her a  dose of Depo-Medrol in the clinic.  We added on Flovent 220 mcg 2 puffs twice daily for 2 weeks to provide a little more anti-inflammatory activity in the lungs.  We collected the last bit of information needed for [name] approval and continued her on Symbicort 160/4.5 mcg 2 puffs twice daily and Singulair 10 mg daily.  We deferred on restarting her allergy shots until her asthma was under better control.  We continued her on Xyzal 5 mg daily, fluticasone 2  sprays per nostril daily, and as a lasting spray 2 sprays per nostril 1-2 times daily.  She has a history of noncompliance and missed appointments, which have hindered her progress.  Since the last visit, [name] has done well. She has remained on her medications but is under the impression that she was supposed to be using her albuterol twice daily no matter what. In any case, [name]'s asthma has been well controlled. She has not required rescue medication, experienced nocturnal awakenings due to lower respiratory symptoms, nor have activities of daily living been limited. She has required no Emergency Department or Urgent Care visits for her asthma. She has required zero courses of systemic steroids for asthma exacerbations since the last visit. ACT score today is 5, indicating terrible asthma symptom control. This is mostly due to her chronic use of her albuterol inhaler, but overall her ACT score does not match with her clinical presentation today as well as her reporting of her overall control.   Allergic rhinitis is stable. She has been keeping herself inside over the past few days to try to control her symptoms.  She remains on all of her medications but she has not restarted her allergen immunotherapy since her asthma has not been under great control since prior to December 2018.   In the interim, she was evaluated for abnormal uterine bleeding.  She is scheduled for a hysterectomy and oophorectomy at the end of July.  Otherwise, there have been no changes to her past medical history, surgical history, family history, or social history.    Review of Systems: a 14-point review of systems is pertinent for what is mentioned in HPI.  Otherwise, all other systems were negative. Constitutional: negative other than that listed in the HPI Eyes: negative other than that listed in the HPI Ears, nose, mouth, throat, and face: negative other than that listed in the HPI Respiratory: negative other than  that listed in the HPI Cardiovascular: negative other than that listed in the HPI Gastrointestinal: negative other than that listed in the HPI Genitourinary: negative other than that listed in the HPI Integument: negative other than that listed in the HPI Hematologic: negative other than that listed in the HPI Musculoskeletal: negative other than that listed in the HPI Neurological: negative other than that listed in the HPI Allergy/Immunologic: negative other than that listed in the HPI    Objective:   Blood pressure 130/84, pulse 68, resp. rate 16. There is no height or weight on file to calculate BMI.   Physical Exam:  General: Alert, interactive, in no acute distress. Talking in full sentences today and quite happy.  Eyes: No conjunctival injection bilaterally, no discharge on the right, no discharge on the left and no Horner-Trantas dots present. PERRL bilaterally. EOMI without pain. No photophobia.  Ears: Right TM pearly gray with normal light reflex, Left TM pearly gray with normal light reflex, Right TM intact without perforation and Left TM intact without perforation.  Nose/Throat: External nose within normal limits  and septum midline. Turbinates edematous and pale with clear discharge. Posterior oropharynx mildly erythematous without cobblestoning in the posterior oropharynx. Tonsils 2+ without exudates.  Tongue without thrush. Lungs: Decreased breath sounds with expiratory wheezing bilaterally, but the wheezing was markedly improved compared to previous visits. No increased work of breathing. CV: Normal S1/S2. No murmurs. Capillary refill <2 seconds.  Skin: Warm and dry, without lesions or rashes. Neuro:   Grossly intact. No focal deficits appreciated. Responsive to questions.  Diagnostic studies: none      Salvatore Marvel, MD  [facility] [place]

## 2017-12-24 ENCOUNTER — Encounter: Payer: Self-pay | Admitting: Allergy & Immunology

## 2018-01-19 NOTE — Patient Instructions (Addendum)
Your procedure is scheduled on: Tuesday, July 30  Enter through the Main Entrance of [facility] at: 10:45 am  Pick up the phone at the desk and dial [phone].  Call this number if you have problems the morning of surgery: [phone].  Remember: Do NOT eat food or Do NOT drink clear liquids (including water) after midnight Monday.  Take these medicines the morning of surgery with a SIP OF WATER: zantac.  May use symbicort inhaler.  Ok to use pulmicort neb treat at 630 am on day of surgery per Dr Annye Asa.  Bring albuterol inhaler with you on day of surgery.  Brush your teeth on the morning of surgery.  Stop herbal medications, vitamin supplements, Ibuprofen/NSAIDS 1 week prior to surgery.  Do NOT wear jewelry (body piercing), metal hair clips/bobby pins, make-up, or nail polish. Do NOT wear lotions, powders, or perfumes.  You may wear deoderant. Do NOT shave for 48 hours prior to surgery. Do NOT bring valuables to the hospital.  Leave suitcase in car.  After surgery it may be brought to your room.  For patients admitted to the hospital, checkout time is 11:00 AM the day of discharge. Home with Husband [name] cell [phone].

## 2018-01-20 ENCOUNTER — Ambulatory Visit (INDEPENDENT_AMBULATORY_CARE_PROVIDER_SITE_OTHER): Payer: Medicare HMO | Admitting: Allergy & Immunology

## 2018-01-20 ENCOUNTER — Encounter: Payer: Self-pay | Admitting: Allergy & Immunology

## 2018-01-20 ENCOUNTER — Ambulatory Visit: Payer: Medicare HMO

## 2018-01-20 VITALS — BP 146/90 | HR 96 | Temp 98.4°F | Resp 16 | Ht 62.0 in | Wt 269.0 lb

## 2018-01-20 DIAGNOSIS — J4550 Severe persistent asthma, uncomplicated: Secondary | ICD-10-CM | POA: Diagnosis not present

## 2018-01-20 DIAGNOSIS — J302 Other seasonal allergic rhinitis: Secondary | ICD-10-CM

## 2018-01-20 DIAGNOSIS — J330 Polyp of nasal cavity: Secondary | ICD-10-CM

## 2018-01-20 DIAGNOSIS — J3089 Other allergic rhinitis: Secondary | ICD-10-CM

## 2018-01-20 MED ORDER — BUDESONIDE 0.25 MG/2ML IN SUSP: RESPIRATORY_TRACT | 5 refills | Status: DC

## 2018-01-20 NOTE — Progress Notes (Signed)
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 Severe persistent asthma - Your lung function looked better than last time, but still abnormal.  - It was low, but higher than last time we saw you.  - It did improve with the nebulizer treatment.  - 2nd dose of Fasenra given today.  - Daily controller medication(s): Fasenra monthly x 3 doses (then every 8 weeks thereafter), Singulair 10mg  daily and Symbicort 160/4.53mcg two puffs twice daily with spacer - Prior to physical activity: ProAir 2 puffs 10-15 minutes before physical activity. - Rescue medications: ProAir 4 puffs every 4-6 hours as needed, albuterol nebulizer one vial every 4-6 hours as needed or DuoNeb nebulizer one vial every 4-6 hours as needed  - During flares: add on Flovent 238mcg two puffs twice daily for 1-2 weeks - Asthma control goals:  * Full participation in all desired activities (may need albuterol before activity) * Albuterol use two time or less a week on average (not counting use with activity) * Cough interfering with sleep two time or less a month * Oral steroids no more than once a year * No hospitalizations  2. Seasonal and perennial allergic rhinitis - Make an appointment in two weeks to re-start allergy shots since your current vials are about to expire.  - Continue with Xyzal 5mg  daily. - Continue with azelastine nasal spray 2 sprays per nostril 1-2 times daily. - Stop Flonase and start budesonide rinses twice daily.  3. Return in about 1  month (around 02/17/2018).  Subjective:   [name] is a 42 y.o. female presenting today for follow up of  Chief Complaint  Patient presents with  . Asthma    [name] has a history of the following: Patient Active Problem List   Diagnosis Date Noted  . Non compliance w medication regimen 12/08/2017  . Perennial and seasonal allergic rhinitis 02/22/2017  . Severe persistent asthma 02/22/2017  . Polyp of nasal cavity 02/22/2017  . Leukocytosis 10/15/2016  . Iron deficiency anemia due to chronic blood loss   . Symptomatic anemia   . Asthma exacerbation 10/11/2016  . Hypokalemia 10/11/2016  . Acute asthma 04/22/2016  . Acute sinusitis, unspecified 03/26/2014  . Essential hypertension 12/08/2013  . 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 Fibroids 08/18/2012  . Menorrhagia 08/18/2012  . Extrinsic asthma  08/15/2011    History obtained from: chart review and patient.  [name] Primary Care Provider is Nolene Ebbs, MD.      [name] is a 42 y.o. female presenting for a follow up visit. She was last seen in June 2019. At that time, we started her on Berna Bue (this was after starting her in December 2018, but her never following up). She actually looked much better. We continued her on Symbicort two puffs BID as well as Singulair. We continued her on Xyzal 5mg  daily as well as fluticasone and azelastine nasal sprays. We recommended restarting her allergy shots.   Since the last visit, she reports that she has done well. She  feels good today - although her spirometry looks fairly terrible.  She remains on the Symbicort 160/4.5 mcg 2 puffs twice daily.  She tolerated the Fasenra dose 1 month ago without adverse event.  She has had no prednisone doses since last visit.  She has not needed to add on her Flovent since last visit. [place]'s asthma has been well controlled. She has not required rescue medication,  experienced nocturnal awakenings due to lower respiratory symptoms, nor have activities of daily living been limited. She has required no Emergency Department or Urgent Care visits for her asthma. She has required zero courses of systemic steroids for asthma exacerbations since the last visit. ACT score today is 5, indicating terrible asthma symptom control. Her ACT score does not fit with her current reporting of how well she is doing.   [name] remains on her nose sprays twice daily.  She never did start her allergy shots because she forgot about it. She has a history of a polypectomy without improvement in her symptoms (this was done years ago). She endorses continued anosmia, although this is slowly improving compared to the last time that we saw her. She remains interested in starting her allergy shots and will make an appointment on her way out today.   Otherwise, there have been no changes to her past medical history, surgical history, family history, or social history.    Review of Systems: a 14-point review of systems is pertinent for what is mentioned in HPI.  Otherwise, all other systems were negative. Constitutional: negative other than that listed in the HPI Eyes: negative other than that listed in the HPI Ears, nose, mouth, throat, and face: negative other than that listed in the HPI Respiratory: negative other than that listed in the HPI Cardiovascular: negative other than that listed in the HPI Gastrointestinal: negative other than that listed in the HPI Genitourinary: negative other than that listed in the HPI Integument: negative other than that listed in the HPI Hematologic: negative other than that listed in the HPI Musculoskeletal: negative other than that listed in the HPI Neurological: negative other than that listed in the HPI Allergy/Immunologic: negative other than that listed in the HPI    Objective:   Blood pressure (!) 146/90, pulse 96, temperature 98.4 F (36.9  C), temperature source Oral, resp. rate 16, height 5\' 2"  (1.575 m), weight 269 lb (122 kg), last menstrual period 12/19/2017, SpO2 97 %. Body mass index is 49.2 kg/m.   Physical Exam:  General: Alert, interactive, in no acute distress. Talkative. Able to speak in full sentences. Eyes: No conjunctival injection bilaterally, no discharge on the right, no discharge on the left and no Horner-Trantas dots present. PERRL bilaterally. EOMI without pain. No photophobia.  Ears: Right TM pearly gray with normal light reflex, Left TM pearly gray with normal light reflex, Right TM intact without perforation and Left TM intact without perforation.  Nose/Throat: External nose within normal limits and septum midline. Turbinates edematous with clear discharge. Posterior oropharynx markedly erythematous with cobblestoning in the posterior oropharynx. Tonsils 2+ without exudates.  Tongue without thrush. Lungs: Mildly decreased breath sounds with expiratory wheezing bilaterally. No increased work of breathing. CV: Normal S1/S2. No murmurs. Capillary refill <2 seconds.  Skin: Warm and dry, without lesions or rashes. Neuro:   Grossly intact. No focal deficits appreciated. Responsive to questions.  Diagnostic studies:   Spirometry: results abnormal (FEV1: 1.03/45%, FVC: 1.46/52%, FEV1/FVC: 71%).    Spirometry consistent with mixed obstructive and restrictive disease. Albuterol/Atrovent  nebulizer treatment given in clinic with significant improvement in FVC per ATS criteria.  Allergy Studies: none     Salvatore Marvel, MD  [facility] [place]

## 2018-01-20 NOTE — Patient Instructions (Addendum)
1. Severe persistent asthma - Your lung function looked better than last time, but still abnormal.  - It was low, but higher than last time we saw you.  - It did improve with the nebulizer treatment.  - 2nd dose of Fasenra given today.  - Daily controller medication(s): Fasenra monthly x 3 doses (then every 8 weeks thereafter), Singulair 10mg  daily and Symbicort 160/4.2mcg two puffs twice daily with spacer - Prior to physical activity: ProAir 2 puffs 10-15 minutes before physical activity. - Rescue medications: ProAir 4 puffs every 4-6 hours as needed, albuterol nebulizer one vial every 4-6 hours as needed or DuoNeb nebulizer one vial every 4-6 hours as needed  - During flares: add on Flovent 254mcg two puffs twice daily for 1-2 weeks - Asthma control goals:  * Full participation in all desired activities (may need albuterol before activity) * Albuterol use two time or less a week on average (not counting use with activity) * Cough interfering with sleep two time or less a month * Oral steroids no more than once a year * No hospitalizations  2. Seasonal and perennial allergic rhinitis - Make an appointment in two weeks to re-start allergy shots since your current vials are about to expire.  - Continue with Xyzal 5mg  daily. - Continue with azelastine nasal spray 2 sprays per nostril 1-2 times daily. - Stop Flonase and start budesonide rinses twice daily.  3. Return in about 1 month (around 02/17/2018).   Please inform us of any Emergency Department visits, hospitalizations, or changes in symptoms. Call us before going to the ED for breathing or allergy symptoms since we might be able to fit you in for a sick visit. Feel free to contact us anytime with any questions, problems, or concerns.  It was a pleasure to see you again today!  Websites that have reliable patient information: 1. American Academy of Asthma, Allergy, and Immunology: www.aaaai.org 2. Food Allergy Research and Education  (FARE): foodallergy.org 3. Mothers of Asthmatics: http://www.asthmacommunitynetwork.org 4. American College of Allergy, Asthma, and Immunology: MonthlyElectricBill.co.uk   Make sure you are registered to vote! If you have moved or changed any of your contact information, you will need to get this updated before voting!

## 2018-01-28 ENCOUNTER — Other Ambulatory Visit: Payer: Self-pay | Admitting: Allergy & Immunology

## 2018-01-31 ENCOUNTER — Encounter (HOSPITAL_COMMUNITY)
Admission: RE | Admit: 2018-01-31 | Discharge: 2018-01-31 | Disposition: A | Discharge status: Home / Self Care | Payer: Medicare HMO | Source: Ambulatory Visit | Attending: Obstetrics & Gynecology | Admitting: Obstetrics & Gynecology

## 2018-01-31 ENCOUNTER — Other Ambulatory Visit: Payer: Self-pay

## 2018-01-31 ENCOUNTER — Encounter (HOSPITAL_COMMUNITY): Payer: Self-pay

## 2018-01-31 DIAGNOSIS — Z01812 Encounter for preprocedural laboratory examination: Secondary | ICD-10-CM | POA: Insufficient documentation

## 2018-01-31 HISTORY — DX: Chronic obstructive pulmonary disease, unspecified: J44.9

## 2018-01-31 HISTORY — DX: Other seasonal allergic rhinitis: J30.2

## 2018-01-31 HISTORY — DX: Sleep apnea, unspecified: G47.30

## 2018-01-31 HISTORY — DX: Essential (primary) hypertension: I10

## 2018-01-31 LAB — CBC
HEMATOCRIT: 30.3 % — AB (ref 36.0–46.0)
HEMOGLOBIN: 8.6 g/dL — AB (ref 12.0–15.0)
MCH: 16.7 pg — AB (ref 26.0–34.0)
MCHC: 28.4 g/dL — AB (ref 30.0–36.0)
MCV: 58.8 fL — ABNORMAL LOW (ref 78.0–100.0)
Platelets: 401 10*3/uL — ABNORMAL HIGH (ref 150–400)
RBC: 5.15 MIL/uL — ABNORMAL HIGH (ref 3.87–5.11)
RDW: 20.8 % — ABNORMAL HIGH (ref 11.5–15.5)
WBC: 10.1 10*3/uL (ref 4.0–10.5)

## 2018-01-31 LAB — BASIC METABOLIC PANEL
ANION GAP: 10 (ref 5–15)
BUN: 12 mg/dL (ref 6–20)
CHLORIDE: 106 mmol/L (ref 98–111)
CO2: 21 mmol/L — ABNORMAL LOW (ref 22–32)
Calcium: 8.9 mg/dL (ref 8.9–10.3)
Creatinine, Ser: 0.7 mg/dL (ref 0.44–1.00)
GFR calc non Af Amer: >60 mL/min (ref >60)
Glucose, Bld: 105 mg/dL — ABNORMAL HIGH (ref 70–99)
POTASSIUM: 3.3 mmol/L — AB (ref 3.5–5.1)
SODIUM: 137 mmol/L (ref 135–145)

## 2018-01-31 NOTE — Pre-Procedure Instructions (Signed)
SDS BB History Log given to lab for patient's previous history of a blood transfusion.

## 2018-01-31 NOTE — Pre-Procedure Instructions (Signed)
Reviewed patient's medical history/ medication, EKG from 10/2016 with Dr. Annye Asa.  Per Dr Glennon Mac.  No EKG needed today.  Patient to get surgical clearance from Dr Salvatore Marvel, Allergy and Asthma MD for pulmonary status to be optimal for DOS and continue with same medications that patient currently takes.  Dr Glennon Mac also wants patient to be seen by PCP Dr Nolene Ebbs for HTN and medication prior to surgery.  Explained to patient and she verbalized understanding.  Patient to call and make PCP appt. Today.  Fax sent to Dr Salvatore Marvel for clearance.

## 2018-02-01 ENCOUNTER — Telehealth: Payer: Self-pay

## 2018-02-01 NOTE — Telephone Encounter (Signed)
Reviewed note. Let's get her in sometime this week to check her spirometry and ensure that she is stable for her surgery at the end of the month. This is probably overkill, but I would rather be on the safe side. I do have a 3pm appt tomorrow or we can squeeze her in sometime on Thursday.   Lelan Pons is good about finding places to squeeze patients!   Salvatore Marvel, MD [facility] [place]

## 2018-02-01 NOTE — Telephone Encounter (Signed)
Spoke with the pt and got her scheduled for tomorrow July 24th at [facility]

## 2018-02-01 NOTE — Telephone Encounter (Signed)
Per fax request: Need Surgical clearance from Dr. Ernst Bowler. She is scheduled for an Abdominal  Hysterectomy  With Dr. Ihor Dow on [place]. July 30 th. Anesthesia wants to make sure patient pulmonary status to be optimal for surgery and is continuing with same medication.

## 2018-02-02 ENCOUNTER — Encounter: Payer: Self-pay | Admitting: Allergy & Immunology

## 2018-02-02 ENCOUNTER — Ambulatory Visit (INDEPENDENT_AMBULATORY_CARE_PROVIDER_SITE_OTHER): Payer: Medicare HMO | Admitting: Allergy & Immunology

## 2018-02-02 VITALS — BP 160/100 | HR 98 | Temp 98.3°F | Resp 18 | Ht 62.0 in | Wt 270.2 lb

## 2018-02-02 DIAGNOSIS — J4550 Severe persistent asthma, uncomplicated: Secondary | ICD-10-CM | POA: Diagnosis not present

## 2018-02-02 DIAGNOSIS — J330 Polyp of nasal cavity: Secondary | ICD-10-CM

## 2018-02-02 DIAGNOSIS — J3089 Other allergic rhinitis: Secondary | ICD-10-CM | POA: Diagnosis not present

## 2018-02-02 NOTE — Progress Notes (Signed)
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 female with a history of severe persistent asthma and S/PAR presenting for medical clearance for an upcoming procedure. Overall her pulmonary condition has improved over the last three months since restarting the [place]. She has been compliant with her medication regimen, which seems to have stabilized her asthma and has slowly improved her spirometry. She has not required prednisone since May 2019. Restarting her allergen immunotherapy will also help control her asthma as well as her allergic rhinitis. We will get this restarted after she is more mobile following her upcoming surgery.   At this time, [name] is as stable as she will be for her surgery. I would recommend continuing all of her medications during the hospitalization and during the rehabilitation portion of the surgery.    Plan/Recommendations:    1. Severe persistent asthma, nucomplicated - Your lung function looked better than last time (best since April 2019). - I think you are in a good spot for the surgery.  - Daily controller medication(s): Fasenra monthly x 3 doses (then every 8 weeks thereafter), Singulair 10mg  daily and Symbicort 160/4.65mcg two puffs twice daily with spacer - Prior to physical activity: ProAir 2 puffs 10-15 minutes before physical activity. - Rescue medications: ProAir 4 puffs every 4-6 hours as needed, albuterol nebulizer one vial every 4-6 hours as needed or DuoNeb nebulizer one vial  every 4-6 hours as needed  - During flares: add on Flovent 22mcg two puffs twice daily for 1-2 weeks - Asthma control goals:  * Full participation in all desired activities (may need albuterol before activity) * Albuterol use two time or less a week on average (not counting use with activity) * Cough interfering with sleep two time or less a month * Oral steroids no more than once a year * No hospitalizations  2. Seasonal and perennial allergic rhinitis - with nasal polyposis  - We will restart allergy shots once you have recuperated from your surgery.  - Continue with Xyzal 5mg  daily. - Continue with azelastine nasal spray 2 sprays per nostril 1-2 times daily. - Continue with budesonide rinses twice daily. - We will talk to the referral coordinator at your PCP's office to refer you to ENT here in [place].   3. Return in about 1 month (around 03/05/2018).   Subjective:   [name] is a 42 y.o. female presenting today for follow up of  Chief Complaint  Patient presents with  . Asthma    [name] has a history of the following: Patient Active Problem List   Diagnosis Date Noted  . Non compliance w medication regimen 12/08/2017  . Perennial and seasonal allergic rhinitis 02/22/2017  . Severe persistent asthma 02/22/2017  . Polyp of nasal cavity 02/22/2017  . Leukocytosis 10/15/2016  . Iron deficiency anemia due to chronic blood loss   . Symptomatic anemia   . Asthma exacerbation 10/11/2016  . Hypokalemia 10/11/2016  . Acute asthma 04/22/2016  . Acute sinusitis, unspecified 03/26/2014  . Essential hypertension 12/08/2013  .  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 Fibroids 08/18/2012  . Menorrhagia 08/18/2012  . Extrinsic asthma  08/15/2011    History obtained from: chart review and patient.  [name] Primary Care Provider is Nolene Ebbs, MD.     [name] is a 42 y.o. female presenting for  a follow up visit. She was last seen earlier this month, at which time she received her second Fasenra dose. She was continued on her Symbicort 160/4.5 two puffs BID as well as ProAir as needed. We also continued her on Singulair 10mg  daily. Her S/PAR was not well controlled and she decided to restart her   Since the last visit, she has remained stable. She is going to have a hysterectomy performed soon and needed a medical clearance, which is what brought her in today. There is some confusion about blood pressure medicine (apparently she was on it at one point and then this was discontinued - evidently this was a one time administration the last time that she was in the hospital). She is going to see her PCP tomorrow about getting a letter stating that she did not need to be on a BP medication.   Asthma/Respiratory Symptom History: She reports that she has been fine from an asthma perspective. She remains on the Symbicort 160/4.5 two puffs BID and the Singulair. She had better spirometry in April 2019, but this is the best it has been since that time. Since restarting the [facility], she has needed no systemic steroids. She has not needed an ED visits or Urgent Care visits since the last time we saw her.   Allergic Rhinitis Symptom History: She is on her azelastine nasal spray1-2 sprays per nostril up to twice daily. We also started the budesonide nasal rinses to help with nasal polyposis (mostly on the right nares). She did have a nasal polypectomy around 3-4 years ago, but symptoms have consistently worsened since that time. She does feel that the budesonide nasal rinses do provide some relief.   Otherwise, there have been no changes to her past medical history, surgical history, family history, or social history.    Review of Systems: a 14-point review of systems is pertinent for what is mentioned in HPI.  Otherwise, all other systems were negative. Constitutional: negative other than that listed in  the HPI Eyes: negative other than that listed in the HPI Ears, nose, mouth, throat, and face: negative other than that listed in the HPI Respiratory: negative other than that listed in the HPI Cardiovascular: negative other than that listed in the HPI Gastrointestinal: negative other than that listed in the HPI Genitourinary: negative other than that listed in the HPI Integument: negative other than that listed in the HPI Hematologic: negative other than that listed in the HPI Musculoskeletal: negative other than that listed in the HPI Neurological: negative other than that listed in the HPI Allergy/Immunologic: negative other than that listed in the HPI    Objective:   Blood pressure (!) 160/100, pulse 98, temperature 98.3 F (36.8 C), temperature source Oral, resp. rate 18, height 5\' 2"  (1.575 m), weight 270 lb 3.2 oz (122.6 kg), SpO2 97 %. Body mass index is 49.42 kg/m.   Physical Exam:  General: Alert, interactive, in no acute distress. Obese. Talkative.  Eyes: No conjunctival injection bilaterally, no discharge on the right, no discharge on the left, no Horner-Trantas dots present and allergic shiners present bilaterally. PERRL bilaterally. EOMI without pain. No  photophobia.  Ears: Right TM pearly gray with normal light reflex, Left TM pearly gray with normal light reflex, Right TM intact without perforation and Left TM intact without perforation.  Nose/Throat: External nose within normal limits, nasal crease present and septum midline. Turbinates markedly edematous with clear discharge. Posterior oropharynx erythematous without cobblestoning in the posterior oropharynx. Tonsils 2+ without exudates.  Tongue without thrush. There is a nasal polyp appreciated in the right nares.  Lungs: Decreased breath sounds bilaterally without wheezing, rhonchi or rales. No increased work of breathing. No retractions noted.  CV: Normal S1/S2. No murmurs. Capillary refill <2 seconds.  Skin: Warm  and dry, without lesions or rashes. Neuro:   Grossly intact. No focal deficits appreciated. Responsive to questions.  Diagnostic studies:   Spirometry: results abnormal (FEV1: 1.35/47%, FVC: 1.75/49%, FEV1/FVC: 77%).    Spirometry consistent with possible restrictive disease. Overall her values for her FEV1 and FVC have both improved since the last visit.   Allergy Studies: none     Salvatore Marvel, MD  [facility]

## 2018-02-02 NOTE — Patient Instructions (Addendum)
1. Severe persistent asthma - Your lung function looked better than last time (best since April 2019). - I think you are in a good spot for the surgery.  - Daily controller medication(s): Fasenra monthly x 3 doses (then every 8 weeks thereafter), Singulair 10mg  daily and Symbicort 160/4.44mcg two puffs twice daily with spacer - Prior to physical activity: ProAir 2 puffs 10-15 minutes before physical activity. - Rescue medications: ProAir 4 puffs every 4-6 hours as needed, albuterol nebulizer one vial every 4-6 hours as needed or DuoNeb nebulizer one vial every 4-6 hours as needed  - During flares: add on Flovent 224mcg two puffs twice daily for 1-2 weeks - Asthma control goals:  * Full participation in all desired activities (may need albuterol before activity) * Albuterol use two time or less a week on average (not counting use with activity) * Cough interfering with sleep two time or less a month * Oral steroids no more than once a year * No hospitalizations  2. Seasonal and perennial allergic rhinitis - with nasal polyposis  - We will restart allergy shots once you have recuperated from your surgery.  - Continue with Xyzal 5mg  daily. - Continue with azelastine nasal spray 2 sprays per nostril 1-2 times daily. - Continue with budesonide rinses twice daily. - We will talk to the referral coordinator at your PCP's office to refer you to ENT here in [place].   3. Return in about 1 month (around 03/05/2018).   Please inform us of any Emergency Department visits, hospitalizations, or changes in symptoms. Call us before going to the ED for breathing or allergy symptoms since we might be able to fit you in for a sick visit. Feel free to contact us anytime with any questions, problems, or concerns.  It was a pleasure to see you again today!  Websites that have reliable patient information: 1. American Academy of Asthma, Allergy, and Immunology: www.aaaai.org 2. Food Allergy Research and  Education (FARE): foodallergy.org 3. Mothers of Asthmatics: http://www.asthmacommunitynetwork.org 4. American College of Allergy, Asthma, and Immunology: MonthlyElectricBill.co.uk   Make sure you are registered to vote! If you have moved or changed any of your contact information, you will need to get this updated before voting!

## 2018-02-07 ENCOUNTER — Telehealth: Payer: Self-pay

## 2018-02-07 NOTE — Telephone Encounter (Signed)
Dr Laurance Flatten has retired will refer patient to Dr Velvet Bathe office. Patient can do mornings and after August 26th due to surgery on tomorrow. Referral placed in proficient to Dr Velvet Bathe office.

## 2018-02-07 NOTE — Telephone Encounter (Signed)
-----   Message from Valentina Shaggy, MD sent at 02/02/2018  9:00 PM EDT ----- Hi - can we hook her back up with ENT for re-evaluation of her nasal polyps? She was seeing Dr. Laurance Flatten, but she prefers to see someone in [place]. Thanks!   Salvatore Marvel, MD [facility]

## 2018-02-07 NOTE — Telephone Encounter (Signed)
Noted. Thanks for taking care of that!   Haider Hornaday, MD [facility]  

## 2018-02-08 ENCOUNTER — Inpatient Hospital Stay (HOSPITAL_COMMUNITY): Payer: Medicare HMO | Admitting: Registered Nurse

## 2018-02-08 ENCOUNTER — Inpatient Hospital Stay (HOSPITAL_COMMUNITY)
Admission: AD | Admit: 2018-02-08 | Discharge: 2018-02-10 | DRG: 742 | Disposition: A | Discharge status: Home / Self Care | Payer: Medicare HMO | Attending: Obstetrics & Gynecology | Admitting: Obstetrics & Gynecology

## 2018-02-08 ENCOUNTER — Encounter (HOSPITAL_COMMUNITY): Payer: Self-pay

## 2018-02-08 ENCOUNTER — Other Ambulatory Visit: Payer: Self-pay

## 2018-02-08 ENCOUNTER — Encounter (HOSPITAL_COMMUNITY): Payer: Self-pay | Admitting: Registered Nurse

## 2018-02-08 ENCOUNTER — Encounter (HOSPITAL_COMMUNITY)
Admission: AD | Disposition: A | Discharge status: Home / Self Care | Payer: Self-pay | Source: Home / Self Care | Attending: Obstetrics & Gynecology

## 2018-02-08 DIAGNOSIS — N939 Abnormal uterine and vaginal bleeding, unspecified: Secondary | ICD-10-CM | POA: Diagnosis present

## 2018-02-08 DIAGNOSIS — K660 Peritoneal adhesions (postprocedural) (postinfection): Secondary | ICD-10-CM | POA: Diagnosis present

## 2018-02-08 DIAGNOSIS — D500 Iron deficiency anemia secondary to blood loss (chronic): Secondary | ICD-10-CM

## 2018-02-08 DIAGNOSIS — N920 Excessive and frequent menstruation with regular cycle: Secondary | ICD-10-CM | POA: Diagnosis present

## 2018-02-08 DIAGNOSIS — D219 Benign neoplasm of connective and other soft tissue, unspecified: Secondary | ICD-10-CM | POA: Diagnosis present

## 2018-02-08 DIAGNOSIS — Z6841 Body Mass Index (BMI) 40.0 and over, adult: Secondary | ICD-10-CM

## 2018-02-08 DIAGNOSIS — N83292 Other ovarian cyst, left side: Secondary | ICD-10-CM | POA: Diagnosis present

## 2018-02-08 DIAGNOSIS — N83202 Unspecified ovarian cyst, left side: Secondary | ICD-10-CM

## 2018-02-08 DIAGNOSIS — J4550 Severe persistent asthma, uncomplicated: Secondary | ICD-10-CM | POA: Diagnosis present

## 2018-02-08 DIAGNOSIS — N736 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s (postinfective): Secondary | ICD-10-CM | POA: Diagnosis not present

## 2018-02-08 DIAGNOSIS — I10 Essential (primary) hypertension: Secondary | ICD-10-CM | POA: Diagnosis present

## 2018-02-08 DIAGNOSIS — K219 Gastro-esophageal reflux disease without esophagitis: Secondary | ICD-10-CM | POA: Diagnosis present

## 2018-02-08 DIAGNOSIS — Z9889 Other specified postprocedural states: Secondary | ICD-10-CM

## 2018-02-08 DIAGNOSIS — D259 Leiomyoma of uterus, unspecified: Principal | ICD-10-CM

## 2018-02-08 DIAGNOSIS — J45909 Unspecified asthma, uncomplicated: Secondary | ICD-10-CM | POA: Diagnosis present

## 2018-02-08 HISTORY — PX: ABDOMINAL HYSTERECTOMY: SHX81

## 2018-02-08 LAB — CBC
HCT: 25 % — ABNORMAL LOW (ref 36.0–46.0)
Hemoglobin: 7.2 g/dL — ABNORMAL LOW (ref 12.0–15.0)
MCH: 17 pg — ABNORMAL LOW (ref 26.0–34.0)
MCHC: 28.8 g/dL — AB (ref 30.0–36.0)
MCV: 59 fL — ABNORMAL LOW (ref 78.0–100.0)
PLATELETS: 385 10*3/uL (ref 150–400)
RBC: 4.24 MIL/uL (ref 3.87–5.11)
RDW: 20.6 % — ABNORMAL HIGH (ref 11.5–15.5)
WBC: 17.8 10*3/uL — ABNORMAL HIGH (ref 4.0–10.5)

## 2018-02-08 LAB — PREGNANCY, URINE: Preg Test, Ur: NEGATIVE

## 2018-02-08 SURGERY — HYSTERECTOMY, ABDOMINAL
Anesthesia: General | Site: Abdomen | Laterality: Bilateral

## 2018-02-08 MED ORDER — SUGAMMADEX SODIUM 500 MG/5ML IV SOLN
INTRAVENOUS | Status: AC
  Filled 2018-02-08: qty 5

## 2018-02-08 MED ORDER — MEPERIDINE HCL 25 MG/ML IJ SOLN: 6.2500 mg | INTRAMUSCULAR | Status: DC | PRN

## 2018-02-08 MED ORDER — MIDAZOLAM HCL 5 MG/5ML IJ SOLN
INTRAMUSCULAR | Status: DC | PRN
  Administered 2018-02-08: 2 mg via INTRAVENOUS

## 2018-02-08 MED ORDER — AMLODIPINE BESYLATE 10 MG PO TABS
10.0000 mg | ORAL_TABLET | Freq: Every day | ORAL | Status: DC
  Administered 2018-02-09 – 2018-02-10 (×2): 10 mg via ORAL
  Filled 2018-02-08 (×2): qty 1

## 2018-02-08 MED ORDER — ONDANSETRON HCL 4 MG/2ML IJ SOLN
4.0000 mg | Freq: Four times a day (QID) | INTRAMUSCULAR | Status: DC | PRN
Start: 2018-02-08 — End: 2018-02-10

## 2018-02-08 MED ORDER — HYDROMORPHONE HCL 1 MG/ML IJ SOLN
INTRAMUSCULAR | Status: AC
  Administered 2018-02-08: 0.5 mg via INTRAVENOUS
  Filled 2018-02-08: qty 1

## 2018-02-08 MED ORDER — ALBUTEROL SULFATE (2.5 MG/3ML) 0.083% IN NEBU: 3.0000 mL | INHALATION_SOLUTION | Freq: Four times a day (QID) | RESPIRATORY_TRACT | Status: DC | PRN

## 2018-02-08 MED ORDER — LEVOCETIRIZINE DIHYDROCHLORIDE 5 MG PO TABS: 5.0000 mg | ORAL_TABLET | Freq: Every evening | ORAL | Status: DC

## 2018-02-08 MED ORDER — OXYCODONE-ACETAMINOPHEN 5-325 MG PO TABS
1.0000 | ORAL_TABLET | ORAL | Status: DC | PRN
  Administered 2018-02-09: 1 via ORAL
  Filled 2018-02-08: qty 1

## 2018-02-08 MED ORDER — ONDANSETRON HCL 4 MG/2ML IJ SOLN
INTRAMUSCULAR | Status: DC | PRN
  Administered 2018-02-08: 4 mg via INTRAVENOUS

## 2018-02-08 MED ORDER — SUGAMMADEX SODIUM 200 MG/2ML IV SOLN
INTRAVENOUS | Status: DC | PRN
  Administered 2018-02-08: 400 mg via INTRAVENOUS

## 2018-02-08 MED ORDER — ROCURONIUM BROMIDE 100 MG/10ML IV SOLN
INTRAVENOUS | Status: AC
  Filled 2018-02-08: qty 1

## 2018-02-08 MED ORDER — DOCUSATE SODIUM 100 MG PO CAPS
100.0000 mg | ORAL_CAPSULE | Freq: Two times a day (BID) | ORAL | Status: DC
  Administered 2018-02-08 – 2018-02-10 (×4): 100 mg via ORAL
  Filled 2018-02-08 (×4): qty 1

## 2018-02-08 MED ORDER — DEXTROSE 5 % IV SOLN
3.0000 g | INTRAVENOUS | Status: AC
  Administered 2018-02-08: 3 g via INTRAVENOUS
  Filled 2018-02-08: qty 3000

## 2018-02-08 MED ORDER — SCOPOLAMINE 1 MG/3DAYS TD PT72
MEDICATED_PATCH | TRANSDERMAL | Status: AC
  Administered 2018-02-08: 1.5 mg via TRANSDERMAL
  Filled 2018-02-08: qty 1

## 2018-02-08 MED ORDER — DEXAMETHASONE SODIUM PHOSPHATE 10 MG/ML IJ SOLN
INTRAMUSCULAR | Status: AC
Start: 2018-02-08 — End: ?
  Filled 2018-02-08: qty 1

## 2018-02-08 MED ORDER — FENTANYL CITRATE (PF) 250 MCG/5ML IJ SOLN
INTRAMUSCULAR | Status: DC | PRN
  Administered 2018-02-08 (×2): 100 ug via INTRAVENOUS
  Administered 2018-02-08: 50 ug via INTRAVENOUS

## 2018-02-08 MED ORDER — HYDROMORPHONE 1 MG/ML IV SOLN
INTRAVENOUS | Status: DC
  Administered 2018-02-08: 0.4 mg via INTRAVENOUS
  Administered 2018-02-08: 18:00:00 via INTRAVENOUS
  Administered 2018-02-09: 1 mg via INTRAVENOUS
  Administered 2018-02-09: 1.2 mg via INTRAVENOUS
  Administered 2018-02-09: 0.8 mg via INTRAVENOUS
  Filled 2018-02-08: qty 25

## 2018-02-08 MED ORDER — LACTATED RINGERS IV SOLN: INTRAVENOUS | Status: DC

## 2018-02-08 MED ORDER — LIDOCAINE HCL (CARDIAC) PF 100 MG/5ML IV SOSY
PREFILLED_SYRINGE | INTRAVENOUS | Status: AC
  Filled 2018-02-08: qty 5

## 2018-02-08 MED ORDER — PROPOFOL 10 MG/ML IV BOLUS
INTRAVENOUS | Status: AC
  Filled 2018-02-08: qty 20

## 2018-02-08 MED ORDER — BUPIVACAINE HCL (PF) 0.5 % IJ SOLN
INTRAMUSCULAR | Status: DC | PRN
  Administered 2018-02-08: 30 mL

## 2018-02-08 MED ORDER — ALBUMIN HUMAN 5 % IV SOLN
INTRAVENOUS | Status: DC | PRN
  Administered 2018-02-08 (×2): via INTRAVENOUS

## 2018-02-08 MED ORDER — DIPHENHYDRAMINE HCL 12.5 MG/5ML PO ELIX
12.5000 mg | ORAL_SOLUTION | Freq: Four times a day (QID) | ORAL | Status: DC | PRN
  Administered 2018-02-09 (×2): 12.5 mg via ORAL
  Filled 2018-02-08 (×2): qty 5

## 2018-02-08 MED ORDER — FAMOTIDINE IN NACL 20-0.9 MG/50ML-% IV SOLN
20.0000 mg | Freq: Two times a day (BID) | INTRAVENOUS | Status: DC
  Administered 2018-02-08: 20 mg via INTRAVENOUS
  Filled 2018-02-08 (×2): qty 50

## 2018-02-08 MED ORDER — NON FORMULARY: 220.0000 | Freq: Two times a day (BID) | Status: DC

## 2018-02-08 MED ORDER — SCOPOLAMINE 1 MG/3DAYS TD PT72
1.0000 | MEDICATED_PATCH | Freq: Once | TRANSDERMAL | Status: DC
  Administered 2018-02-08: 1.5 mg via TRANSDERMAL

## 2018-02-08 MED ORDER — LIDOCAINE 2% (20 MG/ML) 5 ML SYRINGE
INTRAMUSCULAR | Status: DC | PRN
  Administered 2018-02-08: 100 mg via INTRAVENOUS

## 2018-02-08 MED ORDER — MENTHOL 3 MG MT LOZG: 1.0000 | LOZENGE | OROMUCOSAL | Status: DC | PRN

## 2018-02-08 MED ORDER — ONDANSETRON HCL 4 MG/2ML IJ SOLN: 4.0000 mg | Freq: Four times a day (QID) | INTRAMUSCULAR | Status: DC | PRN

## 2018-02-08 MED ORDER — LORATADINE 10 MG PO TABS
10.0000 mg | ORAL_TABLET | Freq: Every day | ORAL | Status: DC
  Administered 2018-02-08 – 2018-02-09 (×2): 10 mg via ORAL
  Filled 2018-02-08 (×3): qty 1

## 2018-02-08 MED ORDER — ENOXAPARIN SODIUM 60 MG/0.6ML ~~LOC~~ SOLN
60.0000 mg | SUBCUTANEOUS | Status: DC
  Administered 2018-02-09 – 2018-02-10 (×2): 60 mg via SUBCUTANEOUS
  Filled 2018-02-08 (×3): qty 0.6

## 2018-02-08 MED ORDER — FENTANYL CITRATE (PF) 250 MCG/5ML IJ SOLN
INTRAMUSCULAR | Status: AC
  Filled 2018-02-08: qty 5

## 2018-02-08 MED ORDER — OXYCODONE-ACETAMINOPHEN 5-325 MG PO TABS
2.0000 | ORAL_TABLET | ORAL | Status: DC | PRN
  Administered 2018-02-09 (×3): 2 via ORAL
  Filled 2018-02-08 (×3): qty 2

## 2018-02-08 MED ORDER — ALBUMIN HUMAN 5 % IV SOLN
INTRAVENOUS | Status: AC
  Filled 2018-02-08: qty 250

## 2018-02-08 MED ORDER — NALOXONE HCL 0.4 MG/ML IJ SOLN: 0.4000 mg | INTRAMUSCULAR | Status: DC | PRN

## 2018-02-08 MED ORDER — MONTELUKAST SODIUM 10 MG PO TABS
10.0000 mg | ORAL_TABLET | Freq: Every day | ORAL | Status: DC
  Administered 2018-02-08 – 2018-02-09 (×2): 10 mg via ORAL
  Filled 2018-02-08 (×3): qty 1

## 2018-02-08 MED ORDER — LACTATED RINGERS IV SOLN
INTRAVENOUS | Status: DC
  Administered 2018-02-08 (×2): via INTRAVENOUS

## 2018-02-08 MED ORDER — MOMETASONE FURO-FORMOTEROL FUM 200-5 MCG/ACT IN AERO
2.0000 | INHALATION_SPRAY | Freq: Two times a day (BID) | RESPIRATORY_TRACT | Status: DC
  Administered 2018-02-09: 2 via RESPIRATORY_TRACT
  Filled 2018-02-08: qty 8.8

## 2018-02-08 MED ORDER — FLUTICASONE PROPIONATE 50 MCG/ACT NA SUSP
2.0000 | Freq: Every day | NASAL | Status: DC
  Filled 2018-02-08: qty 16

## 2018-02-08 MED ORDER — PROMETHAZINE HCL 25 MG/ML IJ SOLN
INTRAMUSCULAR | Status: AC
  Administered 2018-02-08: 6.25 mg via INTRAVENOUS
  Filled 2018-02-08: qty 1

## 2018-02-08 MED ORDER — ONDANSETRON HCL 4 MG PO TABS: 4.0000 mg | ORAL_TABLET | Freq: Four times a day (QID) | ORAL | Status: DC | PRN

## 2018-02-08 MED ORDER — ROCURONIUM BROMIDE 10 MG/ML (PF) SYRINGE
PREFILLED_SYRINGE | INTRAVENOUS | Status: DC | PRN
  Administered 2018-02-08: 20 mg via INTRAVENOUS
  Administered 2018-02-08: 60 mg via INTRAVENOUS

## 2018-02-08 MED ORDER — IPRATROPIUM-ALBUTEROL 0.5-2.5 (3) MG/3ML IN SOLN
3.0000 mL | RESPIRATORY_TRACT | Status: DC | PRN
  Administered 2018-02-09: 3 mL via RESPIRATORY_TRACT
  Filled 2018-02-08 (×2): qty 3

## 2018-02-08 MED ORDER — AZELASTINE HCL 0.15 % NA SOLN: 2.0000 | Freq: Two times a day (BID) | NASAL | Status: DC

## 2018-02-08 MED ORDER — MIDAZOLAM HCL 2 MG/2ML IJ SOLN
INTRAMUSCULAR | Status: AC
  Filled 2018-02-08: qty 2

## 2018-02-08 MED ORDER — BISACODYL 10 MG RE SUPP: 10.0000 mg | Freq: Every day | RECTAL | Status: DC | PRN

## 2018-02-08 MED ORDER — POLYETHYLENE GLYCOL 3350 17 G PO PACK
17.0000 g | PACK | Freq: Every day | ORAL | Status: DC | PRN
  Administered 2018-02-09: 17 g via ORAL
  Filled 2018-02-08: qty 1

## 2018-02-08 MED ORDER — ONDANSETRON HCL 4 MG/2ML IJ SOLN
INTRAMUSCULAR | Status: AC
  Filled 2018-02-08: qty 2

## 2018-02-08 MED ORDER — DEXTROSE-NACL 5-0.45 % IV SOLN
INTRAVENOUS | Status: DC
  Administered 2018-02-08 – 2018-02-09 (×2): via INTRAVENOUS

## 2018-02-08 MED ORDER — BUPIVACAINE HCL (PF) 0.5 % IJ SOLN
INTRAMUSCULAR | Status: AC
  Filled 2018-02-08: qty 30

## 2018-02-08 MED ORDER — SODIUM CHLORIDE 0.9% FLUSH: 9.0000 mL | INTRAVENOUS | Status: DC | PRN

## 2018-02-08 MED ORDER — PROPOFOL 10 MG/ML IV BOLUS
INTRAVENOUS | Status: DC | PRN
  Administered 2018-02-08: 200 mg via INTRAVENOUS

## 2018-02-08 MED ORDER — DIPHENHYDRAMINE HCL 50 MG/ML IJ SOLN: 12.5000 mg | Freq: Four times a day (QID) | INTRAMUSCULAR | Status: DC | PRN

## 2018-02-08 MED ORDER — KETOROLAC TROMETHAMINE 30 MG/ML IJ SOLN: 30.0000 mg | Freq: Once | INTRAMUSCULAR | Status: DC | PRN

## 2018-02-08 MED ORDER — SIMETHICONE 80 MG PO CHEW
80.0000 mg | CHEWABLE_TABLET | Freq: Four times a day (QID) | ORAL | Status: DC | PRN
  Administered 2018-02-09 (×3): 80 mg via ORAL
  Filled 2018-02-08 (×3): qty 1

## 2018-02-08 MED ORDER — FLUTICASONE PROPIONATE HFA 220 MCG/ACT IN AERO
1.0000 | INHALATION_SPRAY | Freq: Two times a day (BID) | RESPIRATORY_TRACT | Status: DC
Start: 2018-02-08 — End: 2018-02-10
  Administered 2018-02-09: 1 via RESPIRATORY_TRACT
  Filled 2018-02-08: qty 12

## 2018-02-08 MED ORDER — PROMETHAZINE HCL 25 MG/ML IJ SOLN
6.2500 mg | INTRAMUSCULAR | Status: AC | PRN
  Administered 2018-02-08 (×2): 6.25 mg via INTRAVENOUS

## 2018-02-08 MED ORDER — HYDROMORPHONE HCL 1 MG/ML IJ SOLN
0.2500 mg | INTRAMUSCULAR | Status: DC | PRN
  Administered 2018-02-08 (×2): 0.5 mg via INTRAVENOUS

## 2018-02-08 SURGICAL SUPPLY — 48 items
APL SKNCLS STERI-STRIP NONHPOA (GAUZE/BANDAGES/DRESSINGS) ×1
BARRIER ADHS 3X4 INTERCEED (GAUZE/BANDAGES/DRESSINGS) IMPLANT
BENZOIN TINCTURE PRP APPL 2/3 (GAUZE/BANDAGES/DRESSINGS) ×2 IMPLANT
BRR ADH 4X3 ABS CNTRL BYND (GAUZE/BANDAGES/DRESSINGS)
CANISTER SUCT 3000ML PPV (MISCELLANEOUS) ×3 IMPLANT
CLOSURE WOUND 1/2 X4 (GAUZE/BANDAGES/DRESSINGS) ×1
CONT PATH 16OZ SNAP LID 3702 (MISCELLANEOUS) ×3 IMPLANT
DECANTER SPIKE VIAL GLASS SM (MISCELLANEOUS) IMPLANT
DISSECTOR ROUND CHERRY 3/8 STR (MISCELLANEOUS) ×4 IMPLANT
DRAPE CESAREAN BIRTH W POUCH (DRAPES) ×3 IMPLANT
DRAPE WARM FLUID 44X44 (DRAPE) IMPLANT
DRSG OPSITE 11X17.75 LRG (GAUZE/BANDAGES/DRESSINGS) ×2 IMPLANT
DRSG OPSITE POSTOP 4X10 (GAUZE/BANDAGES/DRESSINGS) ×3 IMPLANT
DURAPREP 26ML APPLICATOR (WOUND CARE) ×3 IMPLANT
GAUZE SPONGE 4X4 12PLY STRL (GAUZE/BANDAGES/DRESSINGS) ×3 IMPLANT
GAUZE SPONGE 4X4 16PLY XRAY LF (GAUZE/BANDAGES/DRESSINGS) ×4 IMPLANT
GLOVE BIO SURGEON STRL SZ7 (GLOVE) ×3 IMPLANT
GLOVE BIOGEL PI IND STRL 7.0 (GLOVE) ×3 IMPLANT
GLOVE BIOGEL PI INDICATOR 7.0 (GLOVE) ×6
GOWN STRL REUS W/TWL LRG LVL3 (GOWN DISPOSABLE) ×6 IMPLANT
GOWN STRL REUS W/TWL XL LVL3 (GOWN DISPOSABLE) ×3 IMPLANT
HEMOSTAT ARISTA ABSORB 3G PWDR (MISCELLANEOUS) ×2 IMPLANT
NEEDLE HYPO 22GX1.5 SAFETY (NEEDLE) ×3 IMPLANT
NS IRRIG 1000ML POUR BTL (IV SOLUTION) ×3 IMPLANT
PACK ABDOMINAL GYN (CUSTOM PROCEDURE TRAY) ×3 IMPLANT
PAD ABD 7.5X8 STRL (GAUZE/BANDAGES/DRESSINGS) ×6 IMPLANT
PAD OB MATERNITY 4.3X12.25 (PERSONAL CARE ITEMS) ×3 IMPLANT
PENCIL BUTTON HOLSTER BLD 10FT (ELECTRODE) ×2 IMPLANT
PENCIL SMOKE EVAC W/HOLSTER (ELECTROSURGICAL) ×1 IMPLANT
PROTECTOR NERVE ULNAR (MISCELLANEOUS) ×3 IMPLANT
SPONGE LAP 18X18 X RAY DECT (DISPOSABLE) ×8 IMPLANT
STAPLER VISISTAT 35W (STAPLE) ×1 IMPLANT
STRIP CLOSURE SKIN 1/2X4 (GAUZE/BANDAGES/DRESSINGS) ×1 IMPLANT
SUT PDS AB 0 CTX 60 (SUTURE) ×4 IMPLANT
SUT VIC AB 0 CT1 18XCR BRD8 (SUTURE) ×3 IMPLANT
SUT VIC AB 0 CT1 27 (SUTURE) ×6
SUT VIC AB 0 CT1 27XBRD ANBCTR (SUTURE) ×2 IMPLANT
SUT VIC AB 0 CT1 36 (SUTURE) ×3 IMPLANT
SUT VIC AB 0 CT1 8-18 (SUTURE) ×9
SUT VIC AB 3-0 CT1 27 (SUTURE) ×3
SUT VIC AB 3-0 CT1 TAPERPNT 27 (SUTURE) ×1 IMPLANT
SUT VIC AB 3-0 SH 27 (SUTURE) ×6
SUT VIC AB 3-0 SH 27X BRD (SUTURE) IMPLANT
SUT VIC AB 4-0 KS 27 (SUTURE) ×2 IMPLANT
SUT VICRYL 0 TIES 12 18 (SUTURE) ×3 IMPLANT
SYR CONTROL 10ML LL (SYRINGE) ×3 IMPLANT
TOWEL OR 17X24 6PK STRL BLUE (TOWEL DISPOSABLE) ×6 IMPLANT
TRAY FOLEY W/BAG SLVR 14FR (SET/KITS/TRAYS/PACK) ×3 IMPLANT

## 2018-02-08 NOTE — Op Note (Signed)
02/08/2018  4:07 PM  PATIENT:  [name]  42 y.o. female  PRE-OPERATIVE DIAGNOSIS:  Uterine Fibroids Ovarian Cyst  POST-OPERATIVE DIAGNOSIS:  Uterine Fibroids Ovarian Cyst  PROCEDURE:  Procedure(s): HYSTERECTOMY ABDOMINAL WITH LEFT SALPINGO-OOPORECTOMY AND RIGHT SALPINGECTOMY (Bilateral); extensive lysis of adhesions   SURGEON:  Surgeon(s) and Role:    * Lavonia Drafts, MD - Primary    * Anyanwu, Sallyanne Havers, MD - Assisting    * Constant, Peggy, MD - Assisting  ANESTHESIA:   local and general  EBL:  500 mL   BLOOD ADMINISTERED:none  DRAINS: none   LOCAL MEDICATIONS USED:  MARCAINE     SPECIMEN:  Source of Specimen:  uterus with cervix, left adnexa and partial rigth fallopian tube.    DISPOSITION OF SPECIMEN:  PATHOLOGY  COUNTS:  YES  TOURNIQUET:  * No tourniquets in log *  DICTATION: .Note written in EPIC  PLAN OF CARE: Admit to inpatient   PATIENT DISPOSITION:  PACU - hemodynamically stable.   Delay start of Pharmacological VTE agent (>24hrs) due to surgical blood loss or risk of bleeding: yes  Complications: none immediate  INDICATIONS: The patient is a 42 y.o. with the aforementioned diagnoses who desires definitive surgical management. Pts history is significant for a prev myomectomy.  On the preoperative visit, the risks, benefits, indications, and alternatives of the procedure were reviewed with the patient.  On the day of surgery, the risks of surgery were again discussed with the patient including but not limited to: bleeding which may require transfusion or reoperation; infection which may require antibiotics; injury to bowel, bladder, ureters or other surrounding organs; need for additional procedures; thromboembolic phenomenon, incisional problems and other postoperative/anesthesia complications. Written informed consent was obtained.    OPERATIVE FINDINGS: A 15 week size uterus irreg contour due to fibroids with dilated fallopian tubes  bilaterally adherent to the side wall and the uterus. Adhesions of bowel to the uterus posteriorly and to the adnexa; the right ovary was adherent to the posterior of the uterus; the left ovary contained a cyst and was adherent to the uterus and the bowel.  DESCRIPTION OF PROCEDURE:  The patient received intravenous antibiotics and had sequential compression devices applied to her lower extremities while in the preoperative area.   She was taken to the operating room and placed under general anesthesia without difficulty.The abdomen and perineum were prepped and draped in a sterile manner, and she was placed in a dorsal supine position.  A Foley catheter was inserted into the bladder and attached to constant drainage. After an adequate timeout was performed, a transverse skin incision was made. This incision was taken down to the fascia using electrocautery with care given to maintain good hemostasis. The fascia was incised in the midline and the fascial incision was then extended bilaterally using electrocautery without difficulty. The fascia was then dissected off the underlying rectus muscles using blunt and sharp dissection. The rectus muscles were split bluntly in the midline and the peritoneum entered sharply without complication. This peritoneal incision was then extended superiorly and inferiorly with care given to prevent bowel or bladder injury. Attention was then turned to the pelvis. A retractor was placed into the incision, and the bowel was packed away with moist laparotomy sponges. The uterus at this point was noted to be mobilized and was delivered up out of the abdomen. Metzenbaum scissors were used to dissect the bowel away from the uterus and the adnexa.  Care was take to prevent injury to  the bowel or adjacent structures. The bowel was then packed away with moist laparotomy sponges. The round ligaments on each side were clamped, suture ligated with 0 Vicryl, and transected with electrocautery  allowing entry into the broad ligament. Of note, all sutures used in this procedure are 0 Vicryl unless otherwise noted. The anterior and posterior leaves of the broad ligament were separated, and the ureters were inspected to be safely away from the area of dissection bilaterally.  Adnexae were clamped on the patient's right side, cut, and doubly suture ligated. This procedure was repeated in an identical fashion on the left site allowing for both adnexa to remain in place until the uterus was removed.  Kelly clamps were placed on the mesosalpinx of the right fallopian tube, and the fallopian tube was excised.  The pedicle was then secured with a free tie.   A bladder flap was then created.  The bladder was then bluntly dissected off the lower uterine segment and cervix with good hemostasis noted. The uterine arteries were then skeletonized bilaterally and then clamped, cut, and doubly suture ligated with care given to prevent ureteral injury.  The uterosacral ligaments were then clamped, cut, and ligated bilaterally.  Finally, the cardinal ligaments were clamped, cut, and ligated bilaterally.  Acutely curved clamps were placed across the vagina just under the cervix, and the specimen was amputated and sent to pathology. The vaginal cuff angles were closed with Heaney stiches with care given to incorporate the uterosacral-cardinal ligament pedicles on both sides. The middle of the vaginal cuff was closed with a series of interrupted figure-of-eight sutures with care given to incorporate the anterior pubocervical fascia and the posterior rectovaginal fascia.  Attention was directed back to the adnexa where the infundibulopelvic ligament was identified and clamped on the patient's left side. This pedicle was then clamped, cut, and doubly suture ligated with good hemostasis.  The cyst was dissected out in its entirety and sent to pathology.  The pelvis was irrigated and hemostasis was reconfirmed at all pedicles and  along the pelvic sidewall.  The ureters were inspected and noted to be peristalsing bilaterally.  All laparotomy sponges and instruments were removed from the abdomen. The peritoneum was closed with a interrupted suture, and the fascia was also closed in a running fashion with PDS. The subcutaneous layer was reapproximated with 3-0 vicryl. The skin was closed with a 4-0 Vicryl subcuticular stitch. Sponge, lap, needle, and instrument counts were correct times two. The patient was taken to the recovery area awake, extubated and in stable condition.  [name], M.D., [facility] 02/08/2018 4:08 PM

## 2018-02-08 NOTE — Anesthesia Preprocedure Evaluation (Signed)
Anesthesia Evaluation  Patient identified by MRN, date of birth, ID band Patient awake    Reviewed: Allergy & Precautions, NPO status , Patient's Chart, lab work & pertinent test results  Airway Mallampati: II       Dental no notable dental hx. (+) Teeth Intact   Pulmonary    Pulmonary exam normal breath sounds clear to auscultation       Cardiovascular hypertension, Pt. on medications Normal cardiovascular exam Rhythm:Regular Rate:Normal     Neuro/Psych negative neurological ROS     GI/Hepatic GERD  Medicated,  Endo/Other  Morbid obesity  Renal/GU   negative genitourinary   Musculoskeletal negative musculoskeletal ROS (+)   Abdominal (+) + obese,   Peds  Hematology   Anesthesia Other Findings   Reproductive/Obstetrics                             Anesthesia Physical Anesthesia Plan  ASA: III  Anesthesia Plan: General   Post-op Pain Management:    Induction: Intravenous  PONV Risk Score and Plan: 4 or greater and Ondansetron, Dexamethasone, Midazolam and Scopolamine patch - Pre-op  Airway Management Planned: Oral ETT  Additional Equipment:   Intra-op Plan:   Post-operative Plan: Extubation in OR  Informed Consent: I have reviewed the patients History and Physical, chart, labs and discussed the procedure including the risks, benefits and alternatives for the proposed anesthesia with the patient or authorized representative who has indicated his/her understanding and acceptance.   Dental advisory given  Plan Discussed with: CRNA and Surgeon  Anesthesia Plan Comments:         Anesthesia Quick Evaluation

## 2018-02-08 NOTE — Brief Op Note (Addendum)
02/08/2018  4:07 PM  PATIENT:  [name]  42 y.o. female  PRE-OPERATIVE DIAGNOSIS:  Uterine Fibroids Ovarian Cyst  POST-OPERATIVE DIAGNOSIS:  Uterine Fibroids Ovarian Cyst  PROCEDURE:  Procedure(s): HYSTERECTOMY ABDOMINAL WITH LEFT SALPINGO-OOPORECTOMY AND RIGHT SALPINGECTOMY (Bilateral); lysis of adhesions  SURGEON:  Surgeon(s) and Role:    * Lavonia Drafts, MD - Primary    * Anyanwu, Sallyanne Havers, MD - Assisting    * Constant, Peggy, MD - Assisting  ANESTHESIA:   local and general  EBL:  500 mL   BLOOD ADMINISTERED:none  DRAINS: none   LOCAL MEDICATIONS USED:  MARCAINE     SPECIMEN:  Source of Specimen:  uterus with cervix, left adnexa and partial rigth fallopian tube.    DISPOSITION OF SPECIMEN:  PATHOLOGY  COUNTS:  YES  TOURNIQUET:  * No tourniquets in log *  DICTATION: .Note written in EPIC  PLAN OF CARE: Admit to inpatient   PATIENT DISPOSITION:  PACU - hemodynamically stable.   Delay start of Pharmacological VTE agent (>24hrs) due to surgical blood loss or risk of bleeding: yes  Complications: none immediate  Stacee Earp L. Harraway-Smith, M.D., [facility]

## 2018-02-08 NOTE — Transfer of Care (Signed)
Immediate Anesthesia Transfer of Care Note  Patient: [name]  Procedure(s) Performed: HYSTERECTOMY ABDOMINAL WITH LEFT SALPINGO-OOPORECTOMY AND RIGHT SALPINGECTOMY (Bilateral Abdomen)  Patient Location: PACU  Anesthesia Type:General  Level of Consciousness: awake, alert  and oriented  Airway & Oxygen Therapy: Patient Spontanous Breathing and Patient connected to nasal cannula oxygen  Post-op Assessment: Report given to RN and Post -op Vital signs reviewed and stable  Post vital signs: Reviewed and stable  Last Vitals:  Vitals Value Taken Time  BP 147/87 02/08/2018  3:45 PM  Temp    Pulse 99 02/08/2018  3:46 PM  Resp 27 02/08/2018  3:46 PM  SpO2 100 % 02/08/2018  3:46 PM  Vitals shown include unvalidated device data.  Last Pain:  Vitals:   02/08/18 1119  TempSrc: Oral      Patients Stated Pain Goal: 3 (09/81/19 1478)  Complications: No apparent anesthesia complications

## 2018-02-08 NOTE — H&P (Signed)
Preoperative History and Physical  [name] is a 42 y.o. G1P0010 here for surgical management of AUB and uterine fibroids and ovarian cyst.  Pt has severe asthma and has been cleared for surgery by pulm. Consult note on chart.    Proposed surgery: Total abdominal hysterectomy with left salpingo-oophorectomy and right salpingectomy  Past Medical History:  Diagnosis Date  . Anemia   . Asthma   . Blood transfusion without reported diagnosis   . COPD (chronic obstructive pulmonary disease) ([place])   . Environmental allergies   . Fibroid, uterine   . Fibroids   . GERD (gastroesophageal reflux disease)   . Hypertension    no meds  . Menorrhagia   . Seasonal allergies    chronic  . Shortness of breath   . Sleep apnea    does not use CPAP   Past Surgical History:  Procedure Laterality Date  . CHOLECYSTECTOMY     06/29/2011  . CHOLECYSTECTOMY  06/29/2011   Procedure: LAPAROSCOPIC CHOLECYSTECTOMY WITH INTRAOPERATIVE CHOLANGIOGRAM;  Surgeon: Merrie Roof, MD;  Location: [place];  Service: General;  Laterality: N/A;  . ECTOPIC PREGNANCY SURGERY     Laparoscopic  . ERCP  06/30/2011   Procedure: ENDOSCOPIC RETROGRADE CHOLANGIOPANCREATOGRAPHY (ERCP);  Surgeon: Jeryl Columbia, MD;  Location: [facility] ENDOSCOPY;  Service: Endoscopy;  Laterality: N/A;  probable sphincterotomy  . ERCP  07/03/2011   Procedure: ENDOSCOPIC RETROGRADE CHOLANGIOPANCREATOGRAPHY (ERCP);  Surgeon: Landry Dyke, MD;  Location: [place];  Service: Endoscopy;  Laterality: N/A;  PROPOFOL  . MYOMECTOMY  2014  . MYOMECTOMY N/A 11/14/2012   Procedure: MYOMECTOMY;  Surgeon: Osborne Oman, MD;  Location: [facility];  Service: Gynecology;  Laterality: N/A;  . NASAL POLYP EXCISION  2016   in [place]  . WISDOM TOOTH EXTRACTION     OB History    Gravida  1   Para  0   Term  0   Preterm  0   AB  1   Living  0     SAB  0   TAB  0   Ectopic  1   Multiple  0   Live Births  0          Patient denies any  cervical dysplasia or STIs. Facility-Administered Medications Prior to Admission  Medication Dose Route Frequency Provider Last Rate Last Dose  . Benralizumab SOSY 30 mg  30 mg Subcutaneous Q28 days Valentina Shaggy, MD   30 mg at 01/20/18 1848  . methylPREDNISolone acetate (DEPO-MEDROL) injection 80 mg  80 mg Intramuscular Once Valentina Shaggy, MD      . omalizumab Arvid Right) injection 375 mg  375 mg Subcutaneous Q14 Days Bobbitt, [place] Muta, MD   375 mg at 07/01/17 1548   Medications Prior to Admission  Medication Sig Dispense Refill Last Dose  . albuterol (PROVENTIL HFA;VENTOLIN HFA) 108 (90 Base) MCG/ACT inhaler Inhale 2 puffs into the lungs every 6 (six) hours as needed for wheezing or shortness of breath.   02/07/2018 at Unknown time  . budesonide (PULMICORT) 0.25 MG/2ML nebulizer solution Budesonide rinses twice daily 60 mL 5 Past Month at Unknown time  . budesonide (PULMICORT) 0.5 MG/2ML nebulizer solution Take 2 mLs (0.5 mg total) by nebulization 2 (two) times daily. 2 mL 0 Past Month at Unknown time  . fluticasone (FLONASE) 50 MCG/ACT nasal spray Place 2 sprays into the nose daily.    Past Month at Unknown time  . megestrol (MEGACE) 40  MG tablet Take 1 tablet (40 mg total) by mouth 2 (two) times daily. Can increase to two tablets twice a day in the event of heavy bleeding 60 tablet 5 02/07/2018 at Unknown time  . montelukast (SINGULAIR) 10 MG tablet Take 1 tablet (10 mg total) at bedtime by mouth. 30 tablet 5 02/07/2018 at Unknown time  . naproxen (NAPROSYN) 500 MG tablet TAKE 1 TABLET BY MOUTH TWICE A DAY WITH MEALS AS NEEDED FOR PAIN 60 tablet 4 Past Month at Unknown time  . ranitidine (ZANTAC) 300 MG tablet Take 300 mg by mouth 2 (two) times daily.    Past Week at Unknown time  . SYMBICORT 160-4.5 MCG/ACT inhaler INHALE 2 PUFFS BY MOUTH TWICE DAILY 30.6 Inhaler 1 02/07/2018 at Unknown time  . amLODipine (NORVASC) 10 MG tablet Take 1 tablet (10 mg total) by mouth daily. 30 tablet  1 Taking  . Azelastine HCl 0.15 % SOLN Place 2 sprays 2 (two) times daily into both nostrils. 30 mL 5 Taking  . Fe Fum-FePoly-FA-Vit C-Vit B3 (INTEGRA F) 125-1 MG CAPS Take 1 capsule by mouth daily. 30 capsule 1 Taking  . fluticasone (FLOVENT HFA) 220 MCG/ACT inhaler Inhale 2 puffs into the lungs 2 (two) times daily. 1 Inhaler 5 Taking  . ipratropium-albuterol (DUONEB) 0.5-2.5 (3) MG/3ML SOLN Take 3 mLs every 6 (six) hours as needed by nebulization. 360 mL 2 Taking  . levocetirizine (XYZAL) 5 MG tablet Take 1 tablet (5 mg total) by mouth every evening. 30 tablet 2 Taking  . tranexamic acid (LYSTEDA) 650 MG TABS tablet TAKE 2 TABLETS BY MOUTH 3 TIMES A DAY DURING MENSES FOR MAX OF 5 DAYS 30 tablet 4 Taking    Allergies  Allergen Reactions  . Bee Venom Anaphylaxis    Patient has an epi pen, also has not been stung before but was noted to be allergic due to allergy testing   . Prednisone Hives and Swelling    Patient has been rechallenged with prednisone and experienced the same symptoms of hives and face swelling.  She can tolerate methylprednisolone IV and PO.   Social History:   reports that she has never smoked. She has never used smokeless tobacco. She reports that she drinks alcohol. She reports that she does not use drugs. Family History  Problem Relation Age of Onset  . Hypertension Mother   . Diabetes Mother   . Hypertension Father   . Hypertension Brother   . Anesthesia problems Neg Hx   . Hypotension Neg Hx   . Malignant hyperthermia Neg Hx   . Pseudochol deficiency Neg Hx     Review of Systems: Noncontributory  PHYSICAL EXAM: Blood pressure 140/79, pulse (!) 108, temperature 98 F (36.7 C), temperature source Oral, resp. rate 16, height 5\' 2"  (1.575 m), weight 270 lb (122.5 kg), last menstrual period 12/19/2017, SpO2 98 %. General appearance - alert, well appearing, and in no distress Chest - Exp wheezes. No rales or rhonchi, symmetric air entry Heart - normal rate and  regular rhythm Abdomen - soft, nontender, nondistended, no masses or organomegaly. Well healed transverse incision.  Pelvic - examination not indicated Extremities - peripheral pulses normal, no pedal edema, no clubbing or cyanosis  Labs: Results for orders placed or performed during the hospital encounter of 02/08/18 (from the past 336 hour(s))  Pregnancy, urine   Collection Time: 02/08/18 10:45 AM  Result Value Ref Range   Preg Test, Ur NEGATIVE NEGATIVE  Results for orders placed or performed during  the hospital encounter of 01/31/18 (from the past 336 hour(s))  CBC   Collection Time: 01/31/18 11:00 AM  Result Value Ref Range   WBC 10.1 4.0 - 10.5 K/uL   RBC 5.15 (H) 3.87 - 5.11 MIL/uL   Hemoglobin 8.6 (L) 12.0 - 15.0 g/dL   HCT 30.3 (L) 36.0 - 46.0 %   MCV 58.8 (L) 78.0 - 100.0 fL   MCH 16.7 (L) 26.0 - 34.0 pg   MCHC 28.4 (L) 30.0 - 36.0 g/dL   RDW 20.8 (H) 11.5 - 15.5 %   Platelets 401 (H) 150 - 400 K/uL  Basic metabolic panel   Collection Time: 01/31/18 11:00 AM  Result Value Ref Range   Sodium 137 135 - 145 mmol/L   Potassium 3.3 (L) 3.5 - 5.1 mmol/L   Chloride 106 98 - 111 mmol/L   CO2 21 (L) 22 - 32 mmol/L   Glucose, Bld 105 (H) 70 - 99 mg/dL   BUN 12 6 - 20 mg/dL   Creatinine, Ser 0.70 0.44 - 1.00 mg/dL   Calcium 8.9 8.9 - 10.3 mg/dL   GFR calc non Af Amer >60 >60 mL/min   GFR calc Af Amer >60 >60 mL/min   Anion gap 10 5 - 15  Type and screen [place]   Collection Time: 01/31/18 11:00 AM  Result Value Ref Range   ABO/RH(D) O POS    Antibody Screen NEG    Sample Expiration 02/14/2018    Extend sample reason      NO TRANSFUSIONS OR PREGNANCY IN THE PAST 3 MONTHS Performed at [facility], [address] [place], [place] [address]     Imaging Studies: 11/29/2017 CLINICAL DATA:  Fibroids, menorrhagia, LEFT ovarian cyst  EXAM: TRANSABDOMINAL AND TRANSVAGINAL ULTRASOUND OF PELVIS  TECHNIQUE: Both transabdominal and transvaginal  ultrasound examinations of the pelvis were performed. Transabdominal technique was performed for global imaging of the pelvis including uterus, ovaries, adnexal regions, and pelvic cul-de-sac. It was necessary to proceed with endovaginal exam following the transabdominal exam to visualize the RIGHT adnexa and to characterize a LEFT ovarian cyst.  COMPARISON:  07/25/2012  FINDINGS: Uterus  Measurements: 12.9 x 5.9 x 7.5 cm. At least 3 uterine leiomyomata are identified. Large anterior leiomyoma at upper uterine segment, subserosal, 4.2 x 3.3 x 4.1 cm. Second anterior leiomyoma at mid uterus on LEFT, 3.7 x 2.5 x 2.9 cm, submucosal. Third mass anterior mid uterus, likely intramural, 2.5 x 1.3 x 1.7 cm, markedly echogenic, question lipoleiomyoma.  Endometrium  Thickness: 4.6 mm.  No endometrial fluid or focal abnormality  Right ovary  Measurements: 4.4 x 2.1 x 2.0 cm.  Normal morphology without mass  Left ovary  Measurements: 11.8 x 11.3 x 9.0 cm. Large cyst 11.9 x 11.3 x 8.9 cm replacing LEFT ovary, with lesion demonstrating slightly irregular margins and question minimal mural nodularity.  Other findings  No endometrial fluid. Hypoechoic somewhat tubular structure partially visualized in RIGHT adnexa, question segment of hydrosalpinx.  IMPRESSION: Multiple uterine leiomyomata, largest 4.2 cm anterior upper uterus, with the smaller lesion 2.5 cm greatest size uniformly echogenic question lipoleiomyoma.  Large mildly complicated LEFT ovarian cyst 11.9 cm greatest size.  Consider surgical evaluation.  This recommendation follows the consensus statement: Management of Asymptomatic Ovarian and Other Adnexal Cysts Imaged at [place]: Society of Radiologists in [place]. Radiology 2010; [phone].  Assessment: Patient Active Problem List   Diagnosis Date Noted  . Non compliance w medication regimen 12/08/2017  .  Perennial  and seasonal allergic rhinitis 02/22/2017  . Severe persistent asthma 02/22/2017  . Polyp of nasal cavity 02/22/2017  . Leukocytosis 10/15/2016  . Iron deficiency anemia due to chronic blood loss   . Symptomatic anemia   . Asthma exacerbation 10/11/2016  . Hypokalemia 10/11/2016  . Acute asthma 04/22/2016  . Acute sinusitis, unspecified 03/26/2014  . Essential hypertension 12/08/2013  . 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 Fibroids 08/18/2012  . Menorrhagia 08/18/2012  . Extrinsic asthma  08/15/2011    Plan: Patient will undergo surgical management with Total abdominal hysterectomy with left salpingo-oophorectomy and right salpingectomy.   The risks of surgery were discussed in detail with the patient including but not limited to: bleeding which may require transfusion or reoperation; infection which may require antibiotics; injury to surrounding organs which may involve bowel, bladder, ureters ; need for additional procedures including laparoscopy or laparotomy; thromboembolic phenomenon, surgical site problems and other postoperative/anesthesia complications. Likelihood of success in alleviating the patient's condition was discussed. Routine postoperative instructions will be reviewed with the patient and her family in detail after surgery.  The patient concurred with the proposed plan, giving informed written consent for the surgery.  Patient has been NPO since last night she will remain NPO for procedure.  Anesthesia and OR aware.  Preoperative prophylactic antibiotics and SCDs ordered on call to the OR.  To OR when ready.  Salimatou Simone L. Harraway-Smith, M.D., [facility] 02/08/2018 11:26 AM

## 2018-02-08 NOTE — Anesthesia Procedure Notes (Signed)
Procedure Name: Intubation Date/Time: 02/08/2018 12:49 PM Performed by: Talbot Grumbling, CRNA Pre-anesthesia Checklist: Patient identified, Emergency Drugs available, Suction available and Patient being monitored Patient Re-evaluated:Patient Re-evaluated prior to induction Oxygen Delivery Method: Circle system utilized Preoxygenation: Pre-oxygenation with 100% oxygen Induction Type: IV induction Ventilation: Mask ventilation without difficulty and Two handed mask ventilation required Laryngoscope Size: Mac and 3 Grade View: Grade I Tube size: 7.5 mm Number of attempts: 1 Airway Equipment and Method: Stylet Placement Confirmation: ETT inserted through vocal cords under direct vision,  positive ETCO2 and breath sounds checked- equal and bilateral Secured at: 22 cm Tube secured with: Tape Dental Injury: Teeth and Oropharynx as per pre-operative assessment

## 2018-02-08 NOTE — Anesthesia Postprocedure Evaluation (Signed)
Anesthesia Post Note  Patient: [name]  Procedure(s) Performed: HYSTERECTOMY ABDOMINAL WITH LEFT SALPINGO-OOPORECTOMY AND RIGHT SALPINGECTOMY (Bilateral Abdomen)     Patient location during evaluation: PACU Anesthesia Type: General Level of consciousness: sedated Pain management: pain level controlled Vital Signs Assessment: post-procedure vital signs reviewed and stable Respiratory status: spontaneous breathing Cardiovascular status: stable Postop Assessment: no apparent nausea or vomiting Anesthetic complications: no    Last Vitals:  Vitals:   02/08/18 1700 02/08/18 1710  BP: (!) 148/83 (!) 153/88  Pulse: 96 98  Resp: (!) 23 19  Temp:    SpO2: 98% 99%    Last Pain:  Vitals:   02/08/18 1710  TempSrc:   PainSc: Asleep   Pain Goal: Patients Stated Pain Goal: 3 (02/08/18 1630)               Seila Liston JR,JOHN Mateo Flow

## 2018-02-09 ENCOUNTER — Encounter (HOSPITAL_COMMUNITY): Payer: Self-pay | Admitting: Obstetrics & Gynecology

## 2018-02-09 DIAGNOSIS — N83292 Other ovarian cyst, left side: Secondary | ICD-10-CM

## 2018-02-09 DIAGNOSIS — N736 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s (postinfective): Secondary | ICD-10-CM

## 2018-02-09 LAB — CBC
HCT: 25.3 % — ABNORMAL LOW (ref 36.0–46.0)
Hemoglobin: 7.3 g/dL — ABNORMAL LOW (ref 12.0–15.0)
MCH: 16.9 pg — ABNORMAL LOW (ref 26.0–34.0)
MCHC: 28.9 g/dL — ABNORMAL LOW (ref 30.0–36.0)
MCV: 58.7 fL — AB (ref 78.0–100.0)
Platelets: 365 10*3/uL (ref 150–400)
RBC: 4.31 MIL/uL (ref 3.87–5.11)
RDW: 20.5 % — ABNORMAL HIGH (ref 11.5–15.5)
WBC: 13.5 10*3/uL — AB (ref 4.0–10.5)

## 2018-02-09 MED ORDER — IBUPROFEN 800 MG PO TABS
800.0000 mg | ORAL_TABLET | Freq: Three times a day (TID) | ORAL | Status: DC
  Administered 2018-02-09 – 2018-02-10 (×4): 800 mg via ORAL
  Filled 2018-02-09 (×4): qty 1

## 2018-02-09 MED ORDER — HYDROCHLOROTHIAZIDE 25 MG PO TABS
25.0000 mg | ORAL_TABLET | Freq: Every day | ORAL | Status: DC
  Administered 2018-02-09 – 2018-02-10 (×2): 25 mg via ORAL
  Filled 2018-02-09 (×2): qty 1

## 2018-02-09 MED ORDER — FAMOTIDINE 20 MG PO TABS
20.0000 mg | ORAL_TABLET | Freq: Two times a day (BID) | ORAL | Status: DC
  Administered 2018-02-09 – 2018-02-10 (×3): 20 mg via ORAL
  Filled 2018-02-09 (×3): qty 1

## 2018-02-09 NOTE — Addendum Note (Signed)
Addendum  created 02/09/18 0829 by Genevie Ann, CRNA   Sign clinical note

## 2018-02-09 NOTE — Progress Notes (Signed)
1 Day Post-Op Procedure(s) (LRB): HYSTERECTOMY ABDOMINAL WITH LEFT SALPINGO-OOPORECTOMY AND RIGHT SALPINGECTOMY (Bilateral)  Subjective: Patient reports incisional pain and tolerating PO.  OOB with assistance. No breathing problems.  Objective: I have reviewed patient's vital signs, intake and output and medications. Vitals:   02/08/18 2000 02/08/18 2100 02/08/18 2149 02/09/18 0022  BP:    137/86  Pulse:    85  Resp: 17 18 20 17   Temp:      TempSrc:      SpO2: 100% 100% 99% 100%  Weight:      Height:       General: alert and no distress GI: soft, non-tender; bowel sounds normal; no masses,  no organomegaly and incision: clean, dry, intact and no drainage present Extremities: extremities normal, atraumatic, no cyanosis or edema and Homans sign is negative, no sign of DVT Vaginal Bleeding: minimal  Assessment: s/p Procedure(s): HYSTERECTOMY ABDOMINAL WITH LEFT SALPINGO-OOPORECTOMY AND RIGHT SALPINGECTOMY (Bilateral): stable, progressing well and tolerating diet  Plan: Advance diet Encourage ambulation Advance to PO medication as tolerated Discontinue IV fluids and later this morning Routine postoperative care   LOS: 1 day    Verita Schneiders, MD 02/09/2018, 12:39 AM

## 2018-02-09 NOTE — Anesthesia Postprocedure Evaluation (Signed)
Anesthesia Post Note  Patient: [name]  Procedure(s) Performed: HYSTERECTOMY ABDOMINAL WITH LEFT SALPINGO-OOPORECTOMY AND RIGHT SALPINGECTOMY (Bilateral Abdomen)     Patient location during evaluation: [facility] Anesthesia Type: General Level of consciousness: awake and alert Pain management: pain level controlled Vital Signs Assessment: post-procedure vital signs reviewed and stable Respiratory status: spontaneous breathing Cardiovascular status: stable Postop Assessment: no headache, adequate PO intake and no apparent nausea or vomiting Anesthetic complications: no    Last Vitals:  Vitals:   02/09/18 0544 02/09/18 0755  BP:  (!) 156/97  Pulse:  86  Resp: 18 17  Temp:  37 C  SpO2: 98%     Last Pain:  Vitals:   02/09/18 0800  TempSrc:   PainSc: 8    Pain Goal: Patients Stated Pain Goal: 3 (02/09/18 0800)               Ailene Ards

## 2018-02-09 NOTE — Progress Notes (Signed)
1 Day Post-Op Procedure(s) (LRB): HYSTERECTOMY ABDOMINAL WITH LEFT SALPINGO-OOPORECTOMY AND RIGHT SALPINGECTOMY (Bilateral)  Subjective: Patient reports tolerating PO and no problems voiding.  She reports that her pain is better controlled on oral meds.   Objective: I have reviewed patient's vital signs, intake and output, medications and labs. I/O last 3 completed shifts: In: 3060 [P.O.:660; I.V.:1900; IV Piggyback:500] Out: 2375 [Urine:1875; Blood:500] Total I/O In: 2617.4 [P.O.:960; I.V.:1657.4] Out: 950 [Urine:950]  General: alert, cooperative and no distress Resp: clear to auscultation bilaterally Cardio: regular rate and rhythm, S1, S2 normal, no murmur, click, rub or gallop GI: soft, non-tender; bowel sounds normal; no masses,  no organomegaly and incision: clean and dry Extremities: extremities normal, atraumatic, no cyanosis or edema Vaginal Bleeding: minimal CBC Latest Ref Rng & Units 02/09/2018 02/08/2018 01/31/2018  WBC 4.0 - 10.5 K/uL 13.5(H) 17.8(H) 10.1  Hemoglobin 12.0 - 15.0 g/dL 7.3(L) 7.2(L) 8.6(L)  Hematocrit 36.0 - 46.0 % 25.3(L) 25.0(L) 30.3(L)  Platelets 150 - 400 K/uL 365 385 401(H)   Assessment: s/p Procedure(s): HYSTERECTOMY ABDOMINAL WITH LEFT SALPINGO-OOPORECTOMY AND RIGHT SALPINGECTOMY (Bilateral): stable and progressing well  Plan: Advance diet Encourage ambulation Advance to PO medication Discontinue IV fluids  Repeat CBC in am.   Keep prn Nebs as needed.     LOS: 1 day    Lavonia Drafts 02/09/2018, 11:57 AM

## 2018-02-10 LAB — CBC
HCT: 22 % — ABNORMAL LOW (ref 36.0–46.0)
HCT: 28.4 % — ABNORMAL LOW (ref 36.0–46.0)
HEMOGLOBIN: 6.6 g/dL — AB (ref 12.0–15.0)
Hemoglobin: 8.6 g/dL — ABNORMAL LOW (ref 12.0–15.0)
MCH: 17.6 pg — AB (ref 26.0–34.0)
MCH: 19 pg — ABNORMAL LOW (ref 26.0–34.0)
MCHC: 30 g/dL (ref 30.0–36.0)
MCHC: 30.3 g/dL (ref 30.0–36.0)
MCV: 58.7 fL — ABNORMAL LOW (ref 78.0–100.0)
MCV: 62.8 fL — AB (ref 78.0–100.0)
Platelets: 369 10*3/uL (ref 150–400)
Platelets: 373 10*3/uL (ref 150–400)
RBC: 3.75 MIL/uL — ABNORMAL LOW (ref 3.87–5.11)
RBC: 4.52 MIL/uL (ref 3.87–5.11)
RDW: 20.4 % — ABNORMAL HIGH (ref 11.5–15.5)
RDW: 24.1 % — AB (ref 11.5–15.5)
WBC: 11.4 10*3/uL — ABNORMAL HIGH (ref 4.0–10.5)
WBC: 12 10*3/uL — AB (ref 4.0–10.5)

## 2018-02-10 LAB — PREPARE RBC (CROSSMATCH)

## 2018-02-10 MED ORDER — SODIUM CHLORIDE 0.9% IV SOLUTION
Freq: Once | INTRAVENOUS | Status: AC
  Administered 2018-02-10: 09:00:00 via INTRAVENOUS

## 2018-02-10 MED ORDER — INTEGRA F 125-1 MG PO CAPS: 1.0000 | ORAL_CAPSULE | Freq: Every day | ORAL | 3 refills | Status: DC

## 2018-02-10 MED ORDER — TRAMADOL HCL 50 MG PO TABS: 50.0000 mg | ORAL_TABLET | Freq: Four times a day (QID) | ORAL | Status: DC | PRN

## 2018-02-10 MED ORDER — TRAMADOL HCL 50 MG PO TABS
100.0000 mg | ORAL_TABLET | Freq: Four times a day (QID) | ORAL | Status: DC | PRN
  Administered 2018-02-10: 100 mg via ORAL
  Filled 2018-02-10: qty 2

## 2018-02-10 MED ORDER — DOCUSATE SODIUM 100 MG PO CAPS: 100.0000 mg | ORAL_CAPSULE | Freq: Two times a day (BID) | ORAL | 0 refills | Status: DC

## 2018-02-10 MED ORDER — TRAMADOL HCL 50 MG PO TABS: 100.0000 mg | ORAL_TABLET | Freq: Four times a day (QID) | ORAL | 0 refills | Status: DC | PRN

## 2018-02-10 MED ORDER — HYDROCHLOROTHIAZIDE 12.5 MG PO CAPS: 12.5000 mg | ORAL_CAPSULE | Freq: Every day | ORAL | 1 refills | Status: DC

## 2018-02-10 MED ORDER — IBUPROFEN 800 MG PO TABS: 800.0000 mg | ORAL_TABLET | Freq: Three times a day (TID) | ORAL | 2 refills | Status: DC | PRN

## 2018-02-10 NOTE — Progress Notes (Signed)
Discharge teaching complete with pt. Pt understood all information and did not have any questions. Pt discharged home to family. 

## 2018-02-10 NOTE — Progress Notes (Signed)
Patient ID: [name], female   DOB: Jul 21, 1975, 42 y.o.   MRN: [id number] Pt with a Hct of 22%. I discussed with her the need for blood given her comorbid conditions. Pt expressed understanding. She was previously told tha tshe might need this or iron.  I answered all of her questions. She is otherwise doing well and should be able to be discharged following her transfusion.   Transfuse when ready 2 units of PRBCs.  Tylerjames Hoglund L. Harraway-Smith, M.D., [facility]

## 2018-02-10 NOTE — Discharge Instructions (Signed)
Abdominal Hysterectomy, Care After °This sheet gives you information about how to care for yourself after your procedure. Your doctor may also give you more specific instructions. If you have problems or questions, contact your doctor. °Follow these instructions at home: °Bathing °· Do not take baths, swim, or use a hot tub until your doctor says it is okay. Ask your doctor if you can take showers. You may only be allowed to take sponge baths for bathing. °· Keep the bandage (dressing) dry until your doctor says it can be taken off. °Surgical cut ( °incision) care °· Follow instructions from your doctor about how to take care of your cut from surgery. Make sure you: °? Wash your hands with soap and water before you change your bandage (dressing). If you cannot use soap and water, use hand sanitizer. °? Change your bandage as told by your doctor. °? Leave stitches (sutures), skin glue, or skin tape (adhesive) strips in place. They may need to stay in place for 2 weeks or longer. If tape strips get loose and curl up, you may trim the loose edges. Do not remove tape strips completely unless your doctor says it is okay. °· Check your surgical cut area every day for signs of infection. Check for: °? Redness, swelling, or pain. °? Fluid or blood. °? Warmth. °? Pus or a bad smell. °Activity °· Do gentle, daily exercise as told by your doctor. You may be told to take short walks every day and go farther each time. °· Do not lift anything that is heavier than 10 lb (4.5 kg), or the limit that your doctor tells you, until he or she says that it is safe. °· Do not drive or use heavy machinery while taking prescription pain medicine. °· Do not drive for 24 hours if you were given a medicine to help you relax (sedative). °· Follow your doctor's advice about exercise, driving, and general activities. Ask your doctor what activities are safe for you. °Lifestyle °· Do not douche, use tampons, or have sex for at least 6 weeks or as  told by your doctor. °· Do not drink alcohol until your doctor says it is okay. °· Drink enough fluid to keep your pee (urine) clear or pale yellow. °· Try to have someone at home with you for the first 1-2 weeks to help. °· Do not use any products that contain nicotine or tobacco, such as cigarettes and e-cigarettes. These can slow down healing. If you need help quitting, ask your doctor. °General instructions °· Take over-the-counter and prescription medicines only as told by your doctor. °· Do not take aspirin or ibuprofen. These medicines can cause bleeding. °· To prevent or treat constipation while you are taking prescription pain medicine, your doctor may suggest that you: °? Drink enough fluid to keep your urine clear or pale yellow. °? Take over-the-counter or prescription medicines. °? Eat foods that are high in fiber, such as: °§ Fresh fruits and vegetables. °§ Whole grains. °§ Beans. °? Limit foods that are high in fat and processed sugars, such as fried and sweet foods. °· Keep all follow-up visits as told by your doctor. This is important. °Contact a doctor if: °· You have chills or fever. °· You have redness, swelling, or pain around your cut. °· You have fluid or blood coming from your cut. °· Your cut feels warm to the touch. °· You have pus or a bad smell coming from your cut. °· Your cut breaks   open. °· You feel dizzy or light-headed. °· You have pain or bleeding when you pee. °· You keep having watery poop (diarrhea). °· You keep feeling sick to your stomach (nauseous) or keep throwing up (vomiting). °· You have unusual fluid (discharge) coming from your vagina. °· You have a rash. °· You have a reaction to your medicine. °· Your pain medicine does not help. °Get help right away if: °· You have a fever and your symptoms get worse all of a sudden. °· You have very bad belly (abdominal) pain. °· You are short of breath. °· You pass out (faint). °· You have pain, swelling, or redness of your  leg. °· You bleed a lot from your vagina and notice clumps of blood (clots). °Summary °· Do not take baths, swim, or use a hot tub until your doctor says it is okay. Ask your doctor if you can take showers. You may only be allowed to take sponge baths for bathing. °· Follow your doctor's advice about exercise, driving, and general activities. Ask your doctor what activities are safe for you. °· Do not lift anything that is heavier than 10 lb (4.5 kg), or the limit that your doctor tells you, until he or she says that it is safe. °· Try to have someone at home with you for the first 1-2 weeks to help. °This information is not intended to replace advice given to you by your health care provider. Make sure you discuss any questions you have with your health care provider. °Document Released: 04/07/2008 Document Revised: 06/17/2016 Document Reviewed: 06/17/2016 °[employer]vier Interactive Patient Education © 2017 [employer] ° °

## 2018-02-10 NOTE — Discharge Summary (Signed)
Physician Discharge Summary  Patient ID: [name] MRN: [id number] DOB/AGE: 1976-02-10 42 y.o.  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 The patients postoperative course was complicated by worsened anemia due to blood loss. She was transfused with 2 units of PRBCs. She was asymptomatic prior to the transfusion but, due to her comorbid conditions, anemia could exacerbate her clinical situation.  She was recently started on Norvasc to manage her HTN but, her post op BPs remained elevated so she was started on HCTZ in addition to the Norvasc.  By time of discharge, her pain was controlled on oral pain medications; she was ambulating, voiding without difficulty, tolerating regular diet and passing flatus. She did have a nebulizer treatment for her asthma but, she remained stable and asymptomatic throughout her hospitalization.  She was deemed stable for discharge to home.    Consults: None  Significant Diagnostic Studies: labs: CBC  Treatments: surgery: Total abdominal hysterectomy with LSO and right salpingectomy  Discharge Exam: Blood pressure 124/67, pulse 95, temperature 99.1 F (37.3 C), temperature source Oral, resp. rate 18, height 5\' 2"  (1.575 m), weight 270 lb (122.5 kg), last menstrual period 12/19/2017, SpO2 98 %. General appearance: alert and no distress Resp: wheezes mild exp wheezes  occ. Cardio: regular rate and rhythm, S1, S2 normal, no murmur, click, rub or gallop GI: soft, non-tender; bowel sounds normal; no masses,  no organomegaly Extremities: extremities normal, atraumatic, no cyanosis or edema Incision/Wound: dressing dry  Disposition:    Discharge Instructions    Call MD for:  difficulty breathing, headache or visual disturbances   Complete by:  As directed    Call MD for:  extreme fatigue   Complete by:  As directed    Call MD for:  hives   Complete by:  As directed    Call MD for:  persistant dizziness or light-headedness   Complete by:  As directed    Call MD for:  persistant nausea and vomiting   Complete by:  As directed    Call MD for:  redness, tenderness, or signs of infection (pain, swelling, redness, odor or green/yellow discharge around incision site)   Complete by:  As directed    Call MD for:  severe uncontrolled pain   Complete by:  As directed    Call MD for:  temperature >100.4   Complete by:  As directed    Diet - low sodium heart healthy   Complete by:  As directed    Discharge wound care:   Complete by:  As directed    Please remove outer dressing in 5 days   Driving Restrictions   Complete by:  As directed    No driving for 2 weeks   Increase activity slowly   Complete by:  As directed    Lifting restrictions   Complete by:  As directed    No heavy lifting for 6 weeks  Sexual Activity Restrictions   Complete by:  As directed    No sexual intercourse for 6 weeks     Allergies as of 02/10/2018      Reactions   Bee Venom Anaphylaxis   Patient has an epi pen, also has not been stung before but was noted to be allergic due to allergy testing    Prednisone Hives, Swelling   Patient has been rechallenged with prednisone and experienced the same symptoms of hives and face swelling.  She can tolerate methylprednisolone IV and PO.      Medication List    STOP taking these medications   naproxen 500 MG tablet Commonly known  as:  NAPROSYN     TAKE these medications   albuterol 108 (90 Base) MCG/ACT inhaler Commonly known as:  PROVENTIL HFA;VENTOLIN HFA Inhale 2 puffs into the lungs every 6 (six) hours as needed for wheezing or shortness of breath.   amLODipine 10 MG tablet Commonly known as:  NORVASC Take 1 tablet (10 mg total) by mouth daily.   Azelastine HCl 0.15 % Soln Place 2 sprays 2 (two) times daily into both nostrils.   budesonide 0.5 MG/2ML nebulizer solution Commonly known as:  PULMICORT Take 2 mLs (0.5 mg total) by nebulization 2 (two) times daily.   budesonide 0.25 MG/2ML nebulizer solution Commonly known as:  PULMICORT Budesonide rinses twice daily   docusate sodium 100 MG capsule Commonly known as:  COLACE Take 1 capsule (100 mg total) by mouth 2 (two) times daily.   fluticasone 220 MCG/ACT inhaler Commonly known as:  FLOVENT HFA Inhale 2 puffs into the lungs 2 (two) times daily.   fluticasone 50 MCG/ACT nasal spray Commonly known as:  FLONASE Place 2 sprays into the nose daily.   hydrochlorothiazide 12.5 MG capsule Commonly known as:  MICROZIDE Take 1 capsule (12.5 mg total) by mouth daily.   ibuprofen 800 MG tablet Commonly known as:  ADVIL,MOTRIN Take 1 tablet (800 mg total) by mouth every 8 (eight) hours as needed.   INTEGRA F 125-1 MG Caps Take 1 capsule by mouth daily.   ipratropium-albuterol 0.5-2.5 (3) MG/3ML Soln Commonly known as:  DUONEB Take 3 mLs every 6 (six) hours as needed by nebulization.   levocetirizine 5 MG tablet Commonly known as:  XYZAL Take 1 tablet (5 mg total) by mouth every evening.   montelukast 10 MG tablet Commonly known as:  SINGULAIR Take 1 tablet (10 mg total) at bedtime by mouth.   ranitidine 300 MG tablet Commonly known as:  ZANTAC Take 300 mg by mouth 2 (two) times daily.   SYMBICORT 160-4.5 MCG/ACT inhaler Generic drug:  budesonide-formoterol INHALE 2 PUFFS BY MOUTH TWICE DAILY   traMADol 50 MG tablet Commonly known as:   ULTRAM Take 2 tablets (100 mg total) by mouth every 6 (six) hours as needed for severe pain.            Discharge Care Instructions  (From admission, onward)        Start     Ordered   02/10/18 0000  Discharge wound care:    Comments:  Please remove outer dressing in 5 days   02/10/18 1722     Follow-up Information    Lavonia Drafts, MD Follow up in 2 week(s).   Specialty:  Obstetrics and Gynecology Contact information: [place] [address] [phone]           Signed: Lavonia Drafts 02/13/2018, 9:36 AM

## 2018-02-10 NOTE — Progress Notes (Signed)
Patient ambulating in halls without any difficulty.  Using incentive spirometer as ordered.  Tolerating up ad lib.  Positive flatus.

## 2018-02-10 NOTE — Progress Notes (Signed)
CRITICAL VALUE ALERT  Critical Value:  Results for [name], [name] (MRN [id number]) as of 02/10/2018 06:48  Ref. Range 02/10/2018 06:06  Hemoglobin Latest Ref Range: 12.0 - 15.0 g/dL 6.6 (LL)    Date & Time Notied:  0645  Provider Notified: Dr Kennon Rounds  Orders Received/Actions taken: No new orders.

## 2018-02-11 LAB — TYPE AND SCREEN
ABO/RH(D): O POS
Antibody Screen: NEGATIVE
Unit division: 0
Unit division: 0

## 2018-02-11 LAB — BPAM RBC
Blood Product Expiration Date: 201908112359
Blood Product Expiration Date: 201908112359
ISSUE DATE / TIME: 201908010955
ISSUE DATE / TIME: 201908011224
UNIT TYPE AND RH: 9500
Unit Type and Rh: 9500

## 2018-02-14 ENCOUNTER — Encounter (INDEPENDENT_AMBULATORY_CARE_PROVIDER_SITE_OTHER): Payer: Self-pay

## 2018-02-28 ENCOUNTER — Encounter: Payer: Self-pay | Admitting: Obstetrics & Gynecology

## 2018-02-28 ENCOUNTER — Ambulatory Visit (INDEPENDENT_AMBULATORY_CARE_PROVIDER_SITE_OTHER): Payer: Medicare HMO | Admitting: Obstetrics & Gynecology

## 2018-02-28 VITALS — Wt 262.1 lb

## 2018-02-28 DIAGNOSIS — Z9889 Other specified postprocedural states: Secondary | ICD-10-CM

## 2018-02-28 NOTE — Progress Notes (Signed)
History:  42 y.o. G1P0010 here today for post op from a TAH ; LSO and right salpingectomy on 02/08/2018. Pt with no complaints. She denies N/V. She is voiding and passing stools without difficulty. She denies vasomotor sx.      The following portions of the patient's history were reviewed and updated as appropriate: allergies, current medications, past family history, past medical history, past social history, past surgical history and problem list.  Review of Systems:  Pertinent items are noted in HPI.    Objective:  Physical Exam Weight 262 lb 1.6 oz (118.9 kg), last menstrual period 12/19/2017. Wt 262 lb 1.6 oz (118.9 kg)   LMP 12/19/2017 (Exact Date)   BMI 47.94 kg/m   CONSTITUTIONAL: Well-developed, well-nourished female in no acute distress.  HENT:  Normocephalic, atraumatic EYES: Conjunctivae and EOM are normal. No scleral icterus.  NECK: Normal range of motion SKIN: Skin is warm and dry. No rash noted. Not diaphoretic.No pallor. [place]: Alert and oriented to person, place, and time. Normal coordination.  Abd: Soft, nontender and nondistended; incision: C/D/ I  Pelvic: deferred  Labs and Imaging 02/08/2018 Diagnosis 1. Ovary, cyst, left adnexa - BENIGN SEROUS CYSTADENOMA, 6 CM - NEGATIVE FOR MALIGNANCY. 2. Uterus, cervix and bilateral fallopian tubes - UTERUS WITH BENIGN SECRETORY ENDOMETRIUM SHOWING HORMONE-EFFECT. - BENIGN LEIOMYOMATA. - BENIGN UNREMARKABLE FALLOPIAN TUBE. - NEGATIVE FOR MALIGNANCY.  Assessment & Plan:  2 week post op check from a TAH with bilateral salpingectomy and Left oophorectomy. Pt is doing well.  Gradual increase in activity.  Nothing per vagina F/u in 4 weeks or sooner prn Reviewed surg path  Doree Kuehne L. Harraway-Smith, M.D., Cherlynn June

## 2018-02-28 NOTE — Patient Instructions (Signed)
Abdominal Hysterectomy, Care After °This sheet gives you information about how to care for yourself after your procedure. Your doctor may also give you more specific instructions. If you have problems or questions, contact your doctor. °Follow these instructions at home: °Bathing °· Do not take baths, swim, or use a hot tub until your doctor says it is okay. Ask your doctor if you can take showers. You may only be allowed to take sponge baths for bathing. °· Keep the bandage (dressing) dry until your doctor says it can be taken off. °Surgical cut ( °incision) care °· Follow instructions from your doctor about how to take care of your cut from surgery. Make sure you: °? Wash your hands with soap and water before you change your bandage (dressing). If you cannot use soap and water, use hand sanitizer. °? Change your bandage as told by your doctor. °? Leave stitches (sutures), skin glue, or skin tape (adhesive) strips in place. They may need to stay in place for 2 weeks or longer. If tape strips get loose and curl up, you may trim the loose edges. Do not remove tape strips completely unless your doctor says it is okay. °· Check your surgical cut area every day for signs of infection. Check for: °? Redness, swelling, or pain. °? Fluid or blood. °? Warmth. °? Pus or a bad smell. °Activity °· Do gentle, daily exercise as told by your doctor. You may be told to take short walks every day and go farther each time. °· Do not lift anything that is heavier than 10 lb (4.5 kg), or the limit that your doctor tells you, until he or she says that it is safe. °· Do not drive or use heavy machinery while taking prescription pain medicine. °· Do not drive for 24 hours if you were given a medicine to help you relax (sedative). °· Follow your doctor's advice about exercise, driving, and general activities. Ask your doctor what activities are safe for you. °Lifestyle °· Do not douche, use tampons, or have sex for at least 6 weeks or as  told by your doctor. °· Do not drink alcohol until your doctor says it is okay. °· Drink enough fluid to keep your pee (urine) clear or pale yellow. °· Try to have someone at home with you for the first 1-2 weeks to help. °· Do not use any products that contain nicotine or tobacco, such as cigarettes and e-cigarettes. These can slow down healing. If you need help quitting, ask your doctor. °General instructions °· Take over-the-counter and prescription medicines only as told by your doctor. °· Do not take aspirin or ibuprofen. These medicines can cause bleeding. °· To prevent or treat constipation while you are taking prescription pain medicine, your doctor may suggest that you: °? Drink enough fluid to keep your urine clear or pale yellow. °? Take over-the-counter or prescription medicines. °? Eat foods that are high in fiber, such as: °§ Fresh fruits and vegetables. °§ Whole grains. °§ Beans. °? Limit foods that are high in fat and processed sugars, such as fried and sweet foods. °· Keep all follow-up visits as told by your doctor. This is important. °Contact a doctor if: °· You have chills or fever. °· You have redness, swelling, or pain around your cut. °· You have fluid or blood coming from your cut. °· Your cut feels warm to the touch. °· You have pus or a bad smell coming from your cut. °· Your cut breaks   open. °· You feel dizzy or light-headed. °· You have pain or bleeding when you pee. °· You keep having watery poop (diarrhea). °· You keep feeling sick to your stomach (nauseous) or keep throwing up (vomiting). °· You have unusual fluid (discharge) coming from your vagina. °· You have a rash. °· You have a reaction to your medicine. °· Your pain medicine does not help. °Get help right away if: °· You have a fever and your symptoms get worse all of a sudden. °· You have very bad belly (abdominal) pain. °· You are short of breath. °· You pass out (faint). °· You have pain, swelling, or redness of your  leg. °· You bleed a lot from your vagina and notice clumps of blood (clots). °Summary °· Do not take baths, swim, or use a hot tub until your doctor says it is okay. Ask your doctor if you can take showers. You may only be allowed to take sponge baths for bathing. °· Follow your doctor's advice about exercise, driving, and general activities. Ask your doctor what activities are safe for you. °· Do not lift anything that is heavier than 10 lb (4.5 kg), or the limit that your doctor tells you, until he or she says that it is safe. °· Try to have someone at home with you for the first 1-2 weeks to help. °This information is not intended to replace advice given to you by your health care provider. Make sure you discuss any questions you have with your health care provider. °Document Released: 04/07/2008 Document Revised: 06/17/2016 Document Reviewed: 06/17/2016 °Elsevier Interactive Patient Education © 2017 [employer] ° °

## 2018-03-04 ENCOUNTER — Other Ambulatory Visit: Payer: Self-pay | Admitting: Obstetrics & Gynecology

## 2018-03-04 DIAGNOSIS — N920 Excessive and frequent menstruation with regular cycle: Secondary | ICD-10-CM

## 2018-03-07 ENCOUNTER — Encounter: Payer: Self-pay | Admitting: Allergy & Immunology

## 2018-03-07 ENCOUNTER — Ambulatory Visit (INDEPENDENT_AMBULATORY_CARE_PROVIDER_SITE_OTHER): Payer: Medicare HMO | Admitting: Allergy & Immunology

## 2018-03-07 VITALS — BP 130/78 | HR 82 | Resp 17

## 2018-03-07 DIAGNOSIS — J3089 Other allergic rhinitis: Secondary | ICD-10-CM | POA: Diagnosis not present

## 2018-03-07 DIAGNOSIS — J330 Polyp of nasal cavity: Secondary | ICD-10-CM

## 2018-03-07 DIAGNOSIS — J4550 Severe persistent asthma, uncomplicated: Secondary | ICD-10-CM

## 2018-03-07 NOTE — Patient Instructions (Addendum)
1. Severe persistent asthma - Your lung function looked slightly worse, but essentially stable.  - We will not make any medication changes at this time.  - Daily controller medication(s): Singulair 10mg  daily + Symbicort 160/4.68mcg two puffs twice daily with spacer + Fasenra every 8 weeks - Prior to physical activity: ProAir 2 puffs 10-15 minutes before physical activity. - Rescue medications: ProAir 4 puffs every 4-6 hours as needed, albuterol nebulizer one vial every 4-6 hours as needed or DuoNeb nebulizer one vial every 4-6 hours as needed  - During flares: add on Flovent 227mcg two puffs twice daily for 1-2 weeks - Asthma control goals:  * Full participation in all desired activities (may need albuterol before activity) * Albuterol use two time or less a week on average (not counting use with activity) * Cough interfering with sleep two time or less a month * Oral steroids no more than once a year * No hospitalizations  2. Seasonal and perennial allergic rhinitis - with nasal polyposis  - Make an appointment to restart allergy shots in 1-2 weeks.   - Continue with Xyzal 5mg  daily. - Continue with azelastine nasal spray 2 sprays per nostril 1-2 times daily. - Stop the budesonide rinses twice daily and start Xhance two sprays per nostril twice daily. - Let us know how this is working and we will work to get it approved (this will definitely be a battle).  - We will refer to ENT for an evaluation of nasal polyposis.   3. Return in about 2 months (around 05/07/2018) when you get your next Fasenra injection.   Please inform us of any Emergency Department visits, hospitalizations, or changes in symptoms. Call us before going to the ED for breathing or allergy symptoms since we might be able to fit you in for a sick visit. Feel free to contact us anytime with any questions, problems, or concerns.  It was a pleasure to see you again today!  Websites that have reliable patient  information: 1. American Academy of Asthma, Allergy, and Immunology: www.aaaai.org 2. Food Allergy Research and Education (FARE): foodallergy.org 3. Mothers of Asthmatics: http://www.asthmacommunitynetwork.org 4. American College of Allergy, Asthma, and Immunology: MonthlyElectricBill.co.uk   Make sure you are registered to vote! If you have moved or changed any of your contact information, you will need to get this updated before voting!

## 2018-03-07 NOTE — Progress Notes (Signed)
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  Her spirometry remains stable, in the 50% range, but symptomatically she feels much better.  She is walking a lot more and is working on losing weight.  She feels that the Berna Bue is doing wonders to her clinical state.  We will not make any asthma changes today.  She is interested in restarting her allergen immunotherapy, so we will reorder those.  We will continue her on all of the current medications.  We will also order an ENT referral so that she could be evaluated for a possible polypectomy.  We provided a sample of Xhance to see if this would provide better control of her rhinitis symptoms than the budesonide rinses. We may consider changing to [place] given its new indications of chronic rhinosinusitis with nasal polyps, but at this point she is stable on Fasenra. There are case reports that Berna Bue can help with nasal polyps as well.    Plan/Recommendations:   1. Severe persistent asthma - Your lung function looked slightly worse, but essentially stable.  - We will not make any medication changes at this time.  - Daily controller medication(s): Singulair 10mg  daily + Symbicort 160/4.31mcg two puffs twice daily with spacer + Fasenra every 8 weeks - Prior to physical activity: ProAir 2 puffs 10-15 minutes before physical activity. - Rescue medications: ProAir 4 puffs every 4-6 hours as needed, albuterol nebulizer one vial every 4-6 hours as needed or DuoNeb  nebulizer one vial every 4-6 hours as needed  - During flares: add on Flovent 280mcg two puffs twice daily for 1-2 weeks - Asthma control goals:  * Full participation in all desired activities (may need albuterol before activity) * Albuterol use two time or less a week on average (not counting use with activity) * Cough interfering with sleep two time or less a month * Oral steroids no more than once a year * No hospitalizations  2. Seasonal and perennial allergic rhinitis - with nasal polyposis  - Make an appointment to restart allergy shots in 1-2 weeks.   - Continue with Xyzal 5mg  daily. - Continue with azelastine nasal spray 2 sprays per nostril 1-2 times daily. - Stop the budesonide rinses twice daily and start Xhance two sprays per nostril twice daily. - Let us know how this is working and we will work to get it approved (this will definitely be a battle).  - We will refer to ENT for an evaluation of nasal polyposis.   3. Return in about 2 months (around 05/07/2018) when you get your next Fasenra injection.  Subjective:   [name] is a 42 y.o. female presenting today for follow up of  Chief Complaint  Patient presents with  . Asthma    [name] has a history of the following: Patient Active Problem List   Diagnosis Date Noted  . 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 Complex cyst of left ovary 02/09/2018  . Post-operative state 02/08/2018  . Non compliance w medication regimen 12/08/2017  . Perennial and  seasonal allergic rhinitis 02/22/2017  . Severe persistent asthma 02/22/2017  . Polyp of nasal cavity 02/22/2017  . Leukocytosis 10/15/2016  . Iron deficiency anemia due to chronic blood loss   . Symptomatic anemia   . Asthma exacerbation 10/11/2016  . Hypokalemia 10/11/2016  . Acute asthma 04/22/2016  . Acute sinusitis, unspecified 03/26/2014  . Essential hypertension 12/08/2013  . 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 Anemia due to  menorrhagia and fibroids 11/11/2012  . Fibroids 08/18/2012  . Menorrhagia 08/18/2012  . Extrinsic asthma 08/15/2011    History obtained from: chart review and patient.  [name] Primary Care Provider is Breanna Ebbs, MD.     [name] is a 42 y.o. female presenting for a follow up visit.  She was last seen in July 2019.  At that time, she was doing well from an asthma perspective.  We had recently started her on Fasenra and continued her on Symbicort 160/4.5 mcg 2 puffs twice daily and Pro Air as needed.  She has Flovent that she adds during respiratory flares.  She was on allergy shots, but we decided to hold off since she was having a surgery in the near future.  We continued Xyzal 5 mg daily as well as as a lasting and budesonide rinses.  She does have a history of nasal polyposis and we did refer her to see an otolaryngologist.  Since the last visit, she reports that she has done well.  She did have her hysterectomy at the end of July and has done well since then.  She has had minimal postop complications.  Asthma/Respiratory Symptom History: She remains on her Symbicort 2 puffs twice daily as well as Singulair and Fasenra.  She is due for her a center injection and will be spacing out to every 8 weeks at this point.  Her ACT score is 21 today, indicating excellent asthma control.  She feels that she did much better on her spirometry today.  She denies nighttime coughing or daytime coughing and wheezing.   Allergic Rhinitis Symptom History: She is interested in starting allergen immunotherapy.  However, she is due for [place] today.  She will come back for her first allergy shot.  She remains on her as a lasting as well as the budesonide rinses.  Her budesonide rinses were $25 for her copayment, and she is interested in other options.  She also wants to make sure that we sent it incorrectly given the high cost.  Her other medications are typically $3-$10 apiece.  She remains on Xyzal 5 mg  daily.  She has not been referred to ENT at this point.  She wanted to wait until after her hysterectomy before pursuing this.  Her last polypectomy was 3 years ago, and she is unsure who she saw. Review of her chart shows that she saw Dr. Hassell Done in April 2017, therefore this would like be the best option for further care.   Otherwise, there have been no changes to her past medical history, surgical history, family history, or social history.    Review of Systems: a 14-point review of systems is pertinent for what is mentioned in HPI.  Otherwise, all other systems were negative. Constitutional: negative other than that listed in the HPI Eyes: negative other than that listed in the HPI Ears, nose, mouth, throat, and face: negative other than that listed in the HPI Respiratory: negative other than that listed in the HPI Cardiovascular: negative other than that listed in the HPI  Gastrointestinal: negative other than that listed in the HPI Genitourinary: negative other than that listed in the HPI Integument: negative other than that listed in the HPI Hematologic: negative other than that listed in the HPI Musculoskeletal: negative other than that listed in the HPI Neurological: negative other than that listed in the HPI Allergy/Immunologic: negative other than that listed in the HPI    Objective:   Blood pressure 130/78, pulse 82, resp. rate 17, last menstrual period 12/19/2017, SpO2 99 %. There is no height or weight on file to calculate BMI.   Physical Exam:  General: Alert, interactive, in no acute distress. Pleasant. Able to speak in full sentences.  Eyes: No conjunctival injection bilaterally, no discharge on the right, no discharge on the left and no Horner-Trantas dots present. PERRL bilaterally. EOMI without pain. No photophobia.  Ears: Right TM pearly gray with normal light reflex, Left TM pearly gray with normal light reflex, Right TM intact without perforation and Left  TM intact without perforation.   Nose/Throat: External nose within normal limits and nasal crease present. Turbinates markedly edematous and pale with clear discharge. Posterior oropharynx erythematous with cobblestoning in the posterior oropharynx. Tonsils 2+ without exudates.  Tongue without thrush. Polyps present on the right side. Lungs: Decreased breath sounds bilaterally without wheezing, rhonchi or rales. No increased work of breathing. CV: Normal S1/S2. No murmurs. Capillary refill <2 seconds.  Skin: Warm and dry, without lesions or rashes. Neuro:   Grossly intact. No focal deficits appreciated. Responsive to questions.  Diagnostic studies:   Spirometry: results abnormal (FEV1: 1.17/51%, FVC: 1.61/57%, FEV1/FVC: 73%).    Spirometry consistent with possible restrictive disease. Overall her values are stable compared to their previous findings.   Allergy Studies: none    Salvatore Marvel, MD  [facility]

## 2018-03-08 ENCOUNTER — Encounter: Payer: Self-pay | Admitting: Allergy & Immunology

## 2018-03-09 DIAGNOSIS — J301 Allergic rhinitis due to pollen: Secondary | ICD-10-CM

## 2018-03-09 NOTE — Progress Notes (Signed)
VIALS EXP 03-10-19 

## 2018-03-10 DIAGNOSIS — J3089 Other allergic rhinitis: Secondary | ICD-10-CM

## 2018-03-17 ENCOUNTER — Telehealth: Payer: Self-pay

## 2018-03-17 NOTE — Telephone Encounter (Signed)
I previously referred the patient to Dr Velvet Bathe office 02/04/18. Their office has tried to contact the patient. I called the patient and gave her their contact info to call and set up a appointment. Patient states she will call.

## 2018-03-17 NOTE — Telephone Encounter (Signed)
-----   Message from Fernand Parkins sent at 03/17/2018  9:28 AM EDT -----   ----- Message ----- From: Herbie Drape, LPN Sent: 01/10/1600  11:34 AM EDT To: Fernand Parkins  Please refer patient to ENT for nasal polyposis.

## 2018-03-18 NOTE — Telephone Encounter (Signed)
Noted thank you

## 2018-03-22 ENCOUNTER — Ambulatory Visit (INDEPENDENT_AMBULATORY_CARE_PROVIDER_SITE_OTHER): Payer: Medicare HMO | Admitting: *Deleted

## 2018-03-22 DIAGNOSIS — J309 Allergic rhinitis, unspecified: Secondary | ICD-10-CM | POA: Diagnosis not present

## 2018-03-22 NOTE — Progress Notes (Signed)
Immunotherapy   Patient Details  Name: [name] MRN: [id number] Date of Birth: 07/06/1976  03/22/2018  [name] started injections for  Mold-Dog-CR & Grass-Weed-Tree-Dmite. Following schedule: A  Frequency:2 times per week Epi-Pen:Epi-Pen Available  Consent signed and patient instructions given. No problems after 30 minutes in the office.   Horris Latino 03/22/2018, 6:49 PM

## 2018-03-28 ENCOUNTER — Encounter: Payer: Self-pay | Admitting: Obstetrics & Gynecology

## 2018-03-28 ENCOUNTER — Ambulatory Visit (INDEPENDENT_AMBULATORY_CARE_PROVIDER_SITE_OTHER): Payer: Medicare HMO | Admitting: Obstetrics & Gynecology

## 2018-03-28 ENCOUNTER — Ambulatory Visit (INDEPENDENT_AMBULATORY_CARE_PROVIDER_SITE_OTHER): Payer: Medicare HMO | Admitting: *Deleted

## 2018-03-28 ENCOUNTER — Other Ambulatory Visit: Payer: Self-pay | Admitting: Family Medicine

## 2018-03-28 VITALS — BP 156/106 | HR 86 | Wt 266.1 lb

## 2018-03-28 DIAGNOSIS — J309 Allergic rhinitis, unspecified: Secondary | ICD-10-CM

## 2018-03-28 DIAGNOSIS — Z9889 Other specified postprocedural states: Secondary | ICD-10-CM

## 2018-03-28 NOTE — Progress Notes (Signed)
History:  42 y.o. G1P0010 here today for pt is s/p TAH/ LSO and right salpingectmg on 02/08/2018. She presents for post op check.   Pt is doing well. No bleeding or abnormal discharge.   Pt denies hot flushes. She began to have swelling in her LE and called her PC and she was told to stop her meds.     The following portions of the patient's history were reviewed and updated as appropriate: allergies, current medications, past family history, past medical history, past social history, past surgical history and problem list.  Review of Systems:  Pertinent items are noted in HPI.    Objective:  Physical Exam Blood pressure (!) 156/106, pulse 86, weight 266 lb 1.6 oz (120.7 kg), last menstrual period 12/19/2017.  CONSTITUTIONAL: Well-developed, well-nourished female in no acute distress.  HENT:  Normocephalic, atraumatic EYES: Conjunctivae and EOM are normal. No scleral icterus.  NECK: Normal range of motion SKIN: Skin is warm and dry. No rash noted. Not diaphoretic.No pallor. [place]: Alert and oriented to person, place, and time. Normal coordination.  Abd: Soft, nontender and nondistended; well healed transverse incison Pelvic: Normal appearing external genitalia; normal appearing vaginal mucosa and cervix.  Normal discharge.  Small uterus, no other palpable masses, no uterine or adnexal tenderness  Labs and Imaging 02/08/2018 Diagnosis 1. Ovary, cyst, left adnexa - BENIGN SEROUS CYSTADENOMA, 6 CM - NEGATIVE FOR MALIGNANCY. 2. Uterus, cervix and bilateral fallopian tubes - UTERUS WITH BENIGN SECRETORY ENDOMETRIUM SHOWING HORMONE-EFFECT. - BENIGN LEIOMYOMATA. - BENIGN UNREMARKABLE FALLOPIAN TUBE. - NEGATIVE FOR MALIGNANCY.  Assessment & Plan:  6 week post op check- doing well  Return to full activities  F/u in 3 months or sooner prn  Chronic HTN- elevated BP.   Pt has f/u appt on friday  Samara Stankowski L. Harraway-Smith, M.D., [facility]lynn June

## 2018-03-28 NOTE — Progress Notes (Signed)
Pt BP elevated.  Pt states that she has previously been on BP medication to control her BP and she has not been taking it since her surgery d/t swelling.  Pt to see PCP on Friday 9/20.

## 2018-04-06 ENCOUNTER — Ambulatory Visit (INDEPENDENT_AMBULATORY_CARE_PROVIDER_SITE_OTHER): Payer: Medicare HMO

## 2018-04-06 DIAGNOSIS — J309 Allergic rhinitis, unspecified: Secondary | ICD-10-CM | POA: Diagnosis not present

## 2018-04-12 ENCOUNTER — Ambulatory Visit (INDEPENDENT_AMBULATORY_CARE_PROVIDER_SITE_OTHER): Payer: Medicare HMO | Admitting: *Deleted

## 2018-04-12 ENCOUNTER — Other Ambulatory Visit: Payer: Self-pay | Admitting: *Deleted

## 2018-04-12 DIAGNOSIS — J309 Allergic rhinitis, unspecified: Secondary | ICD-10-CM

## 2018-04-12 MED ORDER — HYDROCHLOROTHIAZIDE 12.5 MG PO TABS: 12.5000 mg | ORAL_TABLET | Freq: Every day | ORAL | 3 refills | Status: DC

## 2018-04-12 NOTE — Telephone Encounter (Signed)
Received fax for refill of hydrochlorothiazide. Will forward to provider.

## 2018-04-19 ENCOUNTER — Ambulatory Visit (INDEPENDENT_AMBULATORY_CARE_PROVIDER_SITE_OTHER): Payer: Medicare HMO | Admitting: *Deleted

## 2018-04-19 DIAGNOSIS — J309 Allergic rhinitis, unspecified: Secondary | ICD-10-CM | POA: Diagnosis not present

## 2018-04-27 ENCOUNTER — Ambulatory Visit (INDEPENDENT_AMBULATORY_CARE_PROVIDER_SITE_OTHER): Payer: Medicare HMO | Admitting: General Practice

## 2018-04-27 ENCOUNTER — Other Ambulatory Visit (HOSPITAL_COMMUNITY)
Admission: RE | Admit: 2018-04-27 | Discharge: 2018-04-27 | Disposition: A | Discharge status: Home / Self Care | Payer: Medicare HMO | Source: Ambulatory Visit | Attending: Obstetrics & Gynecology | Admitting: Obstetrics & Gynecology

## 2018-04-27 DIAGNOSIS — N898 Other specified noninflammatory disorders of vagina: Secondary | ICD-10-CM

## 2018-04-27 MED ORDER — FLUCONAZOLE 150 MG PO TABS: 150.0000 mg | ORAL_TABLET | Freq: Once | ORAL | 0 refills | Status: AC

## 2018-04-27 NOTE — Progress Notes (Signed)
Patient presents to office today reporting thick white vaginal discharge with itching for past week. Patient states it feels like a yeast infection to her. Patient instructed in self swab and specimen collected. Rx for Diflucan sent in per protocol. Told patient we will contact her with results if + for anything other than yeast. Patient verbalizes understanding.   Derinda Late, RN BSN IBCLC

## 2018-04-27 NOTE — Progress Notes (Signed)
I have reviewed this chart and agree with the RN/CMA assessment and management.    Kyona Chauncey C Decarlo Rivet, MD, FACOG Attending Physician, [facility]  

## 2018-04-28 LAB — CERVICOVAGINAL ANCILLARY ONLY
Bacterial vaginitis: NEGATIVE
Candida vaginitis: POSITIVE — AB
TRICH (WINDOWPATH): NEGATIVE

## 2018-05-03 ENCOUNTER — Ambulatory Visit: Payer: Self-pay

## 2018-05-17 ENCOUNTER — Ambulatory Visit (INDEPENDENT_AMBULATORY_CARE_PROVIDER_SITE_OTHER): Payer: Medicare HMO | Admitting: *Deleted

## 2018-05-17 ENCOUNTER — Other Ambulatory Visit (HOSPITAL_COMMUNITY)
Admission: RE | Admit: 2018-05-17 | Discharge: 2018-05-17 | Disposition: A | Discharge status: Home / Self Care | Payer: Medicare HMO | Source: Ambulatory Visit | Attending: Family Medicine | Admitting: Family Medicine

## 2018-05-17 VITALS — BP 137/84 | HR 87 | Wt 263.0 lb

## 2018-05-17 DIAGNOSIS — N898 Other specified noninflammatory disorders of vagina: Secondary | ICD-10-CM | POA: Insufficient documentation

## 2018-05-17 NOTE — Progress Notes (Signed)
Pt here for self swab.  Pt reports that she came in previously for a yeast infection and was prescribed a dose of medication but it did not fully clear up her yeast infection and she continues to have itching and thick lumpy white discharge.  Pt educated on how to perform a self swab.

## 2018-05-19 ENCOUNTER — Other Ambulatory Visit: Payer: Self-pay | Admitting: Family Medicine

## 2018-05-19 ENCOUNTER — Other Ambulatory Visit: Payer: Self-pay | Admitting: General Practice

## 2018-05-19 DIAGNOSIS — N898 Other specified noninflammatory disorders of vagina: Secondary | ICD-10-CM

## 2018-05-19 LAB — CERVICOVAGINAL ANCILLARY ONLY
Bacterial vaginitis: NEGATIVE
Candida vaginitis: POSITIVE — AB
Chlamydia: NEGATIVE
NEISSERIA GONORRHEA: NEGATIVE
TRICH (WINDOWPATH): NEGATIVE

## 2018-05-19 MED ORDER — FLUCONAZOLE 150 MG PO TABS: 150.0000 mg | ORAL_TABLET | Freq: Once | ORAL | 0 refills | Status: DC

## 2018-05-19 MED ORDER — FLUCONAZOLE 150 MG PO TABS: 150.0000 mg | ORAL_TABLET | Freq: Once | ORAL | 0 refills | Status: AC

## 2018-05-23 ENCOUNTER — Telehealth: Payer: Self-pay | Admitting: *Deleted

## 2018-05-23 NOTE — Telephone Encounter (Signed)
-----   Message from Aura Camps, MD sent at 05/19/2018  9:21 PM EST ----- Regarding: Results Call Hi Caryl Pina, Can you please call this patient and let her know that she does in fact have a yeast infection. I sent her 2 doses of diflucan to the pharmacy. She can take 1 and repeat in 72 hours if symptoms continue.  Thanks!

## 2018-05-23 NOTE — Telephone Encounter (Signed)
Called pt to inform her of her yeast infection.  Explained that the provider has sent 2 doses of diflucan to the [facility] on [address] and that she should take one dose now and then, if symptoms persist after 72 hours, she can take the second dose.  Pt verbalized understanding.

## 2018-06-22 ENCOUNTER — Ambulatory Visit: Payer: Self-pay

## 2018-06-28 ENCOUNTER — Ambulatory Visit: Payer: Self-pay

## 2018-08-18 ENCOUNTER — Other Ambulatory Visit: Payer: Self-pay | Admitting: Family Medicine

## 2018-08-18 DIAGNOSIS — N898 Other specified noninflammatory disorders of vagina: Secondary | ICD-10-CM

## 2018-09-11 ENCOUNTER — Emergency Department (HOSPITAL_BASED_OUTPATIENT_CLINIC_OR_DEPARTMENT_OTHER)
Admission: EM | Admit: 2018-09-11 | Discharge: 2018-09-11 | Disposition: A | Discharge status: Home / Self Care | Payer: Medicare HMO | Attending: Emergency Medicine | Admitting: Emergency Medicine

## 2018-09-11 ENCOUNTER — Encounter (HOSPITAL_BASED_OUTPATIENT_CLINIC_OR_DEPARTMENT_OTHER): Payer: Self-pay | Admitting: Emergency Medicine

## 2018-09-11 ENCOUNTER — Other Ambulatory Visit: Payer: Self-pay

## 2018-09-11 DIAGNOSIS — Y9389 Activity, other specified: Secondary | ICD-10-CM | POA: Insufficient documentation

## 2018-09-11 DIAGNOSIS — L03012 Cellulitis of left finger: Secondary | ICD-10-CM | POA: Diagnosis not present

## 2018-09-11 DIAGNOSIS — Y990 Civilian activity done for income or pay: Secondary | ICD-10-CM | POA: Diagnosis not present

## 2018-09-11 DIAGNOSIS — J449 Chronic obstructive pulmonary disease, unspecified: Secondary | ICD-10-CM | POA: Diagnosis not present

## 2018-09-11 DIAGNOSIS — I10 Essential (primary) hypertension: Secondary | ICD-10-CM | POA: Insufficient documentation

## 2018-09-11 DIAGNOSIS — Z79899 Other long term (current) drug therapy: Secondary | ICD-10-CM | POA: Insufficient documentation

## 2018-09-11 DIAGNOSIS — X58XXXA Exposure to other specified factors, initial encounter: Secondary | ICD-10-CM | POA: Diagnosis not present

## 2018-09-11 DIAGNOSIS — Y9289 Other specified places as the place of occurrence of the external cause: Secondary | ICD-10-CM | POA: Diagnosis not present

## 2018-09-11 DIAGNOSIS — M79645 Pain in left finger(s): Secondary | ICD-10-CM | POA: Diagnosis present

## 2018-09-11 MED ORDER — LIDOCAINE HCL (PF) 1 % IJ SOLN
30.0000 mL | Freq: Once | INTRAMUSCULAR | Status: AC
  Administered 2018-09-11: 30 mL
  Filled 2018-09-11: qty 30

## 2018-09-11 MED ORDER — FLUCONAZOLE 150 MG PO TABS: 150.0000 mg | ORAL_TABLET | Freq: Every day | ORAL | 0 refills | Status: DC

## 2018-09-11 MED ORDER — DOXYCYCLINE HYCLATE 100 MG PO CAPS: 100.0000 mg | ORAL_CAPSULE | Freq: Two times a day (BID) | ORAL | 0 refills | Status: AC

## 2018-09-11 NOTE — Discharge Instructions (Signed)
1. Medications: Prescription sent to your pharmacy for doxycycline.  Please take as prescribed, continue usual home medications  2. Treatment: rest, drink plenty of fluids, Warm water soaks twice a day x 5-7d for 5-10 minutes in Half-strength hydrogen peroxide   3. Follow Up: Please followup with your primary doctor, an urgent care or the ED for wound check in 2 days;   Return to the ER for signs of worsening infection, increasing redness or fevers

## 2018-09-11 NOTE — ED Provider Notes (Signed)
[place] EMERGENCY DEPARTMENT Provider Note   CSN: [id number] Arrival date & time: 09/11/18  1707    History   Chief Complaint Chief Complaint  Patient presents with  . Finger Injury    HPI [name] is a 43 y.o. female with history of hypertension, asthma, GERD, COPD presenting to emergency department today with chief complaint of finger pain x2 weeks.  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  She noticed a bruise on the tip of her left middle finger.  Finger continued to be sore over the next week and she took Tylenol with symptom relief.  However she noticed an open wound to proximal nail fold of the finger x1 week ago.  She reports the pain as a constant throbbing.  The pain does not radiate and is worse with movement.  She rates the pain 7 out of 10 in severity.  Also admits to purulent discharge.  She has been cleaning the wound with peroxide daily.   Denies fever, purulent discharge, previous injury to finger. History provided by patient.       Past Medical History:  Diagnosis Date  . Anemia   . Asthma   . Blood transfusion without reported diagnosis   . COPD (chronic obstructive pulmonary disease) ([place])   . Environmental allergies   . Fibroid, uterine   . Fibroids   . GERD (gastroesophageal reflux disease)   . Hypertension    no meds  . Menorrhagia   . Seasonal allergies    chronic  . Shortness of breath   . Sleep apnea    does not use CPAP    Patient Active Problem List   Diagnosis Date Noted  . 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 Complex cyst of left ovary 02/09/2018  . Post-operative state 02/08/2018  . Non compliance w medication regimen 12/08/2017  . Perennial and seasonal allergic rhinitis 02/22/2017  . Severe persistent asthma 02/22/2017  . Polyp of nasal cavity 02/22/2017  . Leukocytosis 10/15/2016  . Iron deficiency anemia due to chronic blood loss   . Symptomatic anemia   . Asthma  exacerbation 10/11/2016  . Hypokalemia 10/11/2016  . Acute asthma 04/22/2016  . Acute sinusitis, unspecified 03/26/2014  . Essential hypertension 12/08/2013  . 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 Fibroids 08/18/2012  . Menorrhagia 08/18/2012  . Extrinsic asthma 08/15/2011    Past Surgical History:  Procedure Laterality Date  . ABDOMINAL HYSTERECTOMY Bilateral 02/08/2018   Procedure: HYSTERECTOMY ABDOMINAL WITH LEFT SALPINGO-OOPORECTOMY AND RIGHT SALPINGECTOMY;  Surgeon: Lavonia Drafts, MD;  Location: [facility];  Service: Gynecology;  Laterality: Bilateral;  . CHOLECYSTECTOMY     06/29/2011  . CHOLECYSTECTOMY  06/29/2011   Procedure: LAPAROSCOPIC CHOLECYSTECTOMY WITH INTRAOPERATIVE CHOLANGIOGRAM;  Surgeon: Merrie Roof, MD;  Location: [facility];  Service: General;  Laterality: N/A;  . ECTOPIC PREGNANCY SURGERY     Laparoscopic  . ERCP  06/30/2011   Procedure: ENDOSCOPIC RETROGRADE CHOLANGIOPANCREATOGRAPHY (ERCP);  Surgeon: Jeryl Columbia, MD;  Location[facility];  Service: Endoscopy;  Laterality: N/A;  probable sphincterotomy  . ERCP  07/03/2011   Procedure: ENDOSCOPIC RETROGRADE CHOLANGIOPANCREATOGRAPHY (ERCP);  Surgeon: Landry Dyke, MD;  Location: [facility];  Service: Endoscopy;  Laterality: N/A;  PROPOFOL  . MYOMECTOMY  2014  . MYOMECTOMY N/A 11/14/2012   Procedure: MYOMECTOMY;  Surgeon: Osborne Oman, MD;  Location: [facility];  Service: Gynecology;  Laterality: N/A;  . NASAL POLYP EXCISION  2016   in [place]  . WISDOM TOOTH EXTRACTION       OB History    Gravida  1   Para  0   Term  0   Preterm  0   AB  1   Living  0     SAB  0   TAB  0   Ectopic  1   Multiple  0   Live Births  0            Home Medications    Prior to Admission medications   Medication Sig Start Date End Date Taking? Authorizing Provider  albuterol (PROVENTIL HFA;VENTOLIN HFA) 108 (90 Base) MCG/ACT inhaler  Inhale 2 puffs into the lungs every 6 (six) hours as needed for wheezing or shortness of breath.    [provider]  Azelastine HCl 0.15 % SOLN Place 2 sprays 2 (two) times daily into both nostrils. 05/31/17   Valentina Shaggy, MD  budesonide (PULMICORT) 0.25 MG/2ML nebulizer solution Budesonide rinses twice daily 01/20/18   Valentina Shaggy, MD  doxycycline (VIBRAMYCIN) 100 MG capsule Take 1 capsule (100 mg total) by mouth 2 (two) times daily for 5 days. 09/11/18 09/16/18  Albrizze, Kaitlyn E, PA-C  Fe Fum-FePoly-FA-Vit C-Vit B3 (INTEGRA F) 125-1 MG CAPS Take 1 capsule by mouth daily. 02/10/18   Lavonia Drafts, MD  FLOVENT HFA 220 MCG/ACT inhaler TAKE 2 PUFFS BY MOUTH INTO THE LUNGS TWICE A DAY 03/28/18   Ambs, Kathrine Cords, FNP  fluconazole (DIFLUCAN) 150 MG tablet Take 1 tablet (150 mg total) by mouth daily. 09/11/18   Albrizze, Kaitlyn E, PA-C  fluticasone (FLONASE) 50 MCG/ACT nasal spray Place 2 sprays into the nose daily.  02/29/16   [provider]  hydrochlorothiazide (HYDRODIURIL) 12.5 MG tablet Take 1 tablet (12.5 mg total) by mouth daily. 04/12/18   Lavonia Drafts, MD  ibuprofen (ADVIL,MOTRIN) 800 MG tablet Take 1 tablet (800 mg total) by mouth every 8 (eight) hours as needed. 02/10/18   Lavonia Drafts, MD  ipratropium-albuterol (DUONEB) 0.5-2.5 (3) MG/3ML SOLN Take 3 mLs every 6 (six) hours as needed by nebulization. 05/31/17   Valentina Shaggy, MD  montelukast (SINGULAIR) 10 MG tablet Take 1 tablet (10 mg total) at bedtime by mouth. 05/31/17   Valentina Shaggy, MD  ranitidine (ZANTAC) 300 MG tablet Take 300 mg by mouth 2 (two) times daily.  02/29/16   [provider]  SYMBICORT 160-4.5 MCG/ACT inhaler INHALE 2 PUFFS BY MOUTH TWICE DAILY 01/28/18   Bobbitt, Sedalia Muta, MD    Family History Family History  Problem Relation Age of Onset  . Hypertension Mother   . Diabetes Mother   . Hypertension Father   . Hypertension Brother   .  Anesthesia problems Neg Hx   . Hypotension Neg Hx   . Malignant hyperthermia Neg Hx   . Pseudochol deficiency Neg Hx     Social History Social History   Tobacco Use  . Smoking status: Never Smoker  . Smokeless tobacco: Never Used  Substance Use Topics  . Alcohol use: Yes    Comment: occasional  . Drug use: No     Allergies   Bee venom and Prednisone   Review of Systems Review of Systems  Skin: Positive for wound.  All other systems reviewed and are negative.    Physical Exam Updated Vital Signs BP (!) 168/99 (BP Location: Left Arm)   Pulse 65  Temp 98 F (36.7 C)   Resp 18   Ht 5\' 2"  (1.575 m)   Wt 108.9 kg   LMP 12/19/2017 (Exact Date)   SpO2 100%   BMI 43.90 kg/m   Physical Exam Vitals signs and nursing note reviewed.  Constitutional:      Appearance: She is well-developed. She is not toxic-appearing.  HENT:     Head: Normocephalic and atraumatic.  Eyes:     General: No scleral icterus.       Right eye: No discharge.        Left eye: No discharge.     Conjunctiva/sclera: Conjunctivae normal.  Neck:     Musculoskeletal: Normal range of motion.  Cardiovascular:     Rate and Rhythm: Normal rate and regular rhythm.     Pulses: Normal pulses.          Radial pulses are 2+ on the right side and 2+ on the left side.     Heart sounds: Normal heart sounds.  Pulmonary:     Effort: Pulmonary effort is normal.     Breath sounds: Normal breath sounds.  Abdominal:     General: There is no distension.  Musculoskeletal: Normal range of motion.  Skin:    General: Skin is warm and dry.     Capillary Refill: Capillary refill takes less than 2 seconds.     Comments: Mild wwelling of proximal nail fold with1 x 1 cm open wound with surrounding erythema.  Tender to palpation.  Range of motion of DIP limited secondary to pain. Left wrist is nontender and has full range of motion.  Neurological:     Mental Status: She is oriented to person, place, and time.      Comments: Fluent speech, no facial droop.  Psychiatric:        Behavior: Behavior normal.      ED Treatments / Results  Labs (all labs ordered are listed, but only abnormal results are displayed) Labs Reviewed - No data to display  EKG None  Radiology No results found.  Procedures .Marland KitchenIncision and Drainage Date/Time: 09/13/2018 9:03 AM Performed by: Cherre Robins, PA-C Authorized by: Cherre Robins, PA-C   Consent:    Consent obtained:  Verbal   Consent given by:  Patient   Risks discussed:  Bleeding, incomplete drainage, infection and pain   Alternatives discussed:  No treatment and alternative treatment Location:    Indications for incision and drainage: paryonchia.   Size:  1x1   Location:  Upper extremity   Upper extremity location:  Finger   Finger location:  L long finger Pre-procedure details:    Skin preparation:  Chloraprep Procedure type:    Complexity:  Simple Procedure details:    Needle aspiration: yes     Needle size:  18 G   Wound management:  Irrigated with saline   Drainage:  Bloody   Drainage amount:  Moderate   Wound treatment:  Wound left open Post-procedure details:    Patient tolerance of procedure:  Tolerated well, no immediate complications   (including critical care time)  Medications Ordered in ED Medications  lidocaine (PF) (XYLOCAINE) 1 % injection 30 mL (30 mLs Infiltration Given by Other 09/11/18 1855)     Initial Impression / Assessment and Plan / ED Course  I have reviewed the triage vital signs and the nursing notes.  Pertinent labs & imaging results that were available during my care of the patient were reviewed by me and considered  in my medical decision making (see chart for details).    43 y.o. female with paronychia to the proximal nail fold left middle finger. The risks and benefits of the procedure were discussed with the patient, which the patient understood and was agreeable to. Digital block performed with  lidocaine, the finger cleaned with chloraprep and an 18 gauge needle inserted at the junction of the nail fold and nail. There was no purulent discharge expressed, however there was bloody discharge.  The digit was copiously irrigated and wrapped with a sterile bandage after applying a topical antibiotic.See procedure note above. Pt reports feeling pain relief after procedure. The patient has no risks factors for impaired wound healing. 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 Recommend  follow up with primary care. Pt also advised to have blood pressure rechecked with pcp. Strict return precautions given. NAD. VSS. Will d/c the patient to home at this time.    The patient was discussed with and seen by Dr. Laverta Baltimore who agrees with the treatment plan.  This note was prepared with assistance of Systems analyst. Occasional wrong-word or sound-a-like substitutions may have occurred due to the inherent limitations of voice recognition software.       Final Clinical Impressions(s) / ED Diagnoses   Final diagnoses:  Finger pain, left    ED Discharge Orders         Ordered    doxycycline (VIBRAMYCIN) 100 MG capsule  2 times daily     09/11/18 2001    fluconazole (DIFLUCAN) 150 MG tablet  Daily     09/11/18 [address], [place] 09/13/18 5852    Margette Fast, MD 09/14/18 1040

## 2018-09-11 NOTE — ED Triage Notes (Signed)
Reports left middle finger injury x 2 weeks.  Reports worsening pain.

## 2019-02-08 ENCOUNTER — Other Ambulatory Visit: Payer: Self-pay

## 2019-02-08 DIAGNOSIS — Z20822 Contact with and (suspected) exposure to covid-19: Secondary | ICD-10-CM

## 2019-02-09 LAB — NOVEL CORONAVIRUS, NAA: SARS-CoV-2, NAA: NOT DETECTED

## 2019-02-10 ENCOUNTER — Emergency Department (HOSPITAL_BASED_OUTPATIENT_CLINIC_OR_DEPARTMENT_OTHER)
Admission: EM | Admit: 2019-02-10 | Discharge: 2019-02-10 | Disposition: A | Discharge status: Home / Self Care | Payer: Medicare HMO | Attending: Emergency Medicine | Admitting: Emergency Medicine

## 2019-02-10 ENCOUNTER — Other Ambulatory Visit: Payer: Self-pay

## 2019-02-10 ENCOUNTER — Encounter (HOSPITAL_BASED_OUTPATIENT_CLINIC_OR_DEPARTMENT_OTHER): Payer: Self-pay | Admitting: *Deleted

## 2019-02-10 DIAGNOSIS — H1132 Conjunctival hemorrhage, left eye: Secondary | ICD-10-CM

## 2019-02-10 DIAGNOSIS — J45909 Unspecified asthma, uncomplicated: Secondary | ICD-10-CM | POA: Diagnosis not present

## 2019-02-10 DIAGNOSIS — Z79899 Other long term (current) drug therapy: Secondary | ICD-10-CM | POA: Diagnosis not present

## 2019-02-10 DIAGNOSIS — I10 Essential (primary) hypertension: Secondary | ICD-10-CM | POA: Diagnosis not present

## 2019-02-10 MED ORDER — METHYLPREDNISOLONE 4 MG PO TBPK: ORAL_TABLET | ORAL | 0 refills | Status: DC

## 2019-02-10 NOTE — Discharge Instructions (Signed)
1.  Increase your amlodipine back to 10 mg daily.  Keep a diary of your blood pressures.  If blood pressures are getting low and you are feeling dizzy or lightheaded, go back to the 5 mg daily. 2.  Return if you have problems concerns or worsening symptoms.

## 2019-02-10 NOTE — ED Triage Notes (Signed)
Pt was taking Amlodipine 10 mg 3 months ago.  PCP cut it to 5 mg and BP has been high since then.  Left eye has a little streak of red yesterday and woke up with reddened sclera.  Denies itching.

## 2019-02-10 NOTE — ED Provider Notes (Addendum)
[place] [facility] EMERGENCY DEPARTMENT Provider Note   CSN: [id number] Arrival date & time: 02/10/19  1014     History   Chief Complaint Chief Complaint  Patient presents with  . Hypertension, Redness to eye    HPI [name] is a 43 y.o. female.     HPI Patient reports that her doctor decreased her amlodipine to 5 mg daily down from 10 mg.  This occurred about a month ago.  She reports that her blood pressures have been getting high intermittently.  Sometimes numbers get up to as high as the 170s/115.  At other times they are pretty close to normal range.  She denies she is having any symptoms with this but she did develop a small red blood vessel in the left eye yesterday and that was a little more pronounced today.  She reports her mom was worried that it might be due to the blood pressure and so she came to get checked.  Patient chronically has severe problems with sinus congestion.  She reports she had sinus surgeries and had been on multiple medications.  She reports she uses Flonase and Symbicort.  She reports this is been almost a lifelong problem.  She reports that soon as it  gets better it starts to rebound within a few weeks.  Patient also has wheezing with asthma.  She reports she is compliant with all of her medications.  She reports she is not feeling short of breath at this time.  She has not had fever or cough.  She reports she takes Pulmicort regularly.  She reports she sometimes uses albuterol on as needed basis. Past Medical History:  Diagnosis Date  . Anemia   . Asthma   . Blood transfusion without reported diagnosis   . Environmental allergies   . Fibroid, uterine   . Fibroids   . GERD (gastroesophageal reflux disease)   . Hypertension    no meds  . Menorrhagia   . Seasonal allergies    chronic  . Shortness of breath   . Sleep apnea    does not use CPAP    Patient Active Problem List   Diagnosis Date Noted  . 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 Complex cyst of left ovary 02/09/2018  . Post-operative state 02/08/2018  . Non compliance w medication regimen 12/08/2017  . Perennial and seasonal allergic rhinitis 02/22/2017  . Severe persistent asthma 02/22/2017  . Polyp of nasal cavity 02/22/2017  . Leukocytosis 10/15/2016  . Iron deficiency anemia due to chronic blood loss   . Symptomatic anemia   . Asthma exacerbation 10/11/2016  . Hypokalemia 10/11/2016  . Acute asthma 04/22/2016  . Acute sinusitis, unspecified 03/26/2014  . Essential hypertension 12/08/2013  . 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 Fibroids 08/18/2012  . Menorrhagia 08/18/2012  . Extrinsic asthma 08/15/2011    Past Surgical History:  Procedure Laterality Date  . ABDOMINAL HYSTERECTOMY Bilateral 02/08/2018   Procedure: HYSTERECTOMY ABDOMINAL WITH LEFT SALPINGO-OOPORECTOMY AND RIGHT SALPINGECTOMY;  Surgeon: Lavonia Drafts, MD;  Location: [facility];  Service: Gynecology;  Laterality: Bilateral;  . CHOLECYSTECTOMY     06/29/2011  . CHOLECYSTECTOMY  06/29/2011   Procedure: LAPAROSCOPIC CHOLECYSTECTOMY WITH INTRAOPERATIVE CHOLANGIOGRAM;  Surgeon: Merrie Roof, MD;  Location: [facility];  Service: General;  Laterality: N/A;  . ECTOPIC PREGNANCY SURGERY     Laparoscopic  . ERCP  06/30/2011   Procedure: ENDOSCOPIC RETROGRADE CHOLANGIOPANCREATOGRAPHY (ERCP);  Surgeon: Jeryl Columbia, MD;  Location: [facility];  Service: Endoscopy;  Laterality: N/A;  probable sphincterotomy  . ERCP  07/03/2011   Procedure: ENDOSCOPIC RETROGRADE CHOLANGIOPANCREATOGRAPHY (ERCP);  Surgeon: Landry Dyke, MD;  Location: [facility];  Service: Endoscopy;  Laterality: N/A;  PROPOFOL  . MYOMECTOMY  2014  . MYOMECTOMY N/A 11/14/2012   Procedure: MYOMECTOMY;  Surgeon: Osborne Oman, MD;  Location: [facility];  Service: Gynecology;  Laterality: N/A;  . NASAL POLYP EXCISION  2016   in [place]  . WISDOM TOOTH  EXTRACTION       OB History    Gravida  1   Para  0   Term  0   Preterm  0   AB  1   Living  0     SAB  0   TAB  0   Ectopic  1   Multiple  0   Live Births  0            Home Medications    Prior to Admission medications   Medication Sig Start Date End Date Taking? Authorizing Provider  albuterol (PROVENTIL HFA;VENTOLIN HFA) 108 (90 Base) MCG/ACT inhaler Inhale 2 puffs into the lungs every 6 (six) hours as needed for wheezing or shortness of breath.   Yes [provider]  Azelastine HCl 0.15 % SOLN Place 2 sprays 2 (two) times daily into both nostrils. 05/31/17  Yes Valentina Shaggy, MD  Fe Fum-FePoly-FA-Vit C-Vit B3 (INTEGRA F) 125-1 MG CAPS Take 1 capsule by mouth daily. 02/10/18  Yes Lavonia Drafts, MD  fluticasone (FLONASE) 50 MCG/ACT nasal spray Place 2 sprays into the nose daily.  02/29/16  Yes [provider]  ipratropium-albuterol (DUONEB) 0.5-2.5 (3) MG/3ML SOLN Take 3 mLs every 6 (six) hours as needed by nebulization. 05/31/17  Yes Valentina Shaggy, MD  montelukast (SINGULAIR) 10 MG tablet Take 1 tablet (10 mg total) at bedtime by mouth. 05/31/17  Yes Valentina Shaggy, MD  SYMBICORT 160-4.5 MCG/ACT inhaler INHALE 2 PUFFS BY MOUTH TWICE DAILY 01/28/18  Yes Bobbitt, Sedalia Muta, MD  amLODipine (NORVASC) 5 MG tablet Take 5 mg by mouth daily. 12/19/18   [provider]  budesonide (PULMICORT) 0.25 MG/2ML nebulizer solution Budesonide rinses twice daily 01/20/18   Valentina Shaggy, MD  FLOVENT HFA 220 MCG/ACT inhaler TAKE 2 PUFFS BY MOUTH INTO THE LUNGS TWICE A DAY 03/28/18   Ambs, Kathrine Cords, FNP  ibuprofen (ADVIL,MOTRIN) 800 MG tablet Take 1 tablet (800 mg total) by mouth every 8 (eight) hours as needed. 02/10/18   Lavonia Drafts, MD    Family History Family History  Problem Relation Age of Onset  . Hypertension Mother   . Diabetes Mother   . Hypertension Father   . Hypertension Brother   . Anesthesia  problems Neg Hx   . Hypotension Neg Hx   . Malignant hyperthermia Neg Hx   . Pseudochol deficiency Neg Hx     Social History Social History   Tobacco Use  . Smoking status: Never Smoker  . Smokeless tobacco: Never Used  Substance Use Topics  . Alcohol use: Yes    Comment: occasional  . Drug use: No     Allergies   Bee venom and Prednisone   Review of Systems Review of Systems 10 Systems reviewed and are negative for acute change except as noted in the HPI.   Physical Exam Updated Vital Signs BP (!) 161/88 (BP Location: Right Arm)  Pulse 89   Temp 98.4 F (36.9 C) (Oral)   Resp 18   Ht 5\' 2"  (1.575 m)   Wt 121.6 kg   LMP 12/19/2017 (Exact Date)   SpO2 100%   BMI 49.02 kg/m   Physical Exam Constitutional:      Comments: Alert and nontoxic.  No acute distress.  Clinically well in appearance.  HENT:     Head: Normocephalic and atraumatic.     Nose: Nose normal.     Mouth/Throat:     Mouth: Mucous membranes are moist.     Pharynx: Oropharynx is clear.  Eyes:     Extraocular Movements: Extraocular movements intact.     Pupils: Pupils are equal, round, and reactive to light.     Comments: Patient has small amount of medial subconjunctival hemorrhage in the left sclera.  Fairly limited amount.  Neck:     Musculoskeletal: Neck supple.  Cardiovascular:     Rate and Rhythm: Normal rate and regular rhythm.  Pulmonary:     Comments: No respiratory distress.  Patient does have wheezing throughout lung fields.  Good airflow. Abdominal:     General: There is no distension.     Palpations: Abdomen is soft.     Tenderness: There is no abdominal tenderness.  Musculoskeletal: Normal range of motion.        General: No swelling or tenderness.     Right lower leg: No edema.     Left lower leg: No edema.  Skin:    General: Skin is warm and dry.  Neurological:     General: No focal deficit present.     Mental Status: She is oriented to person, place, and time.      Coordination: Coordination normal.  Psychiatric:        Mood and Affect: Mood normal.      ED Treatments / Results  Labs (all labs ordered are listed, but only abnormal results are displayed) Labs Reviewed - No data to display  EKG None  Radiology No results found.  Procedures Procedures (including critical care time)  Medications Ordered in ED Medications - No data to display   Initial Impression / Assessment and Plan / ED Course  I have reviewed the triage vital signs and the nursing notes.  Pertinent labs & imaging results that were available during my care of the patient were reviewed by me and considered in my medical decision making (see chart for details).       Patient presents with history of hypertension was concern for a subconjunctival hemorrhage that developed over the past 2 days.  At this time her blood pressures actually have trended between 160s over 80s to high 120s over 80s.  No signs of endorgan damage.  Patient has a minor subconjunctival hemorrhage but she has significant problems with sinus congestion and blows her nose frequently.  She sounds congested.  She however describes significant chronicity with this and her asthma.  It appears that she is on maximal treatments.  She is comfortable in terms of breathing.  She reports her symptoms are no worse than normal for her at this time in regards to her congestion and wheezing but she feels uncomfortable with the amount of congestion she has in her sinuses and I appreciate audible wheezing.  Patient reports the only thing that is helpful for this is a Solu-Medrol Dosepak.  She reports she will get relief for a few weeks to a month before symptoms start coming back again.return  precautions reviewed.  Final Clinical Impressions(s) / ED Diagnoses   Final diagnoses:  Essential hypertension  Subconjunctival hemorrhage of left eye    ED Discharge Orders    None       Charlesetta Shanks, MD 02/10/19 1138     Charlesetta Shanks, MD 02/10/19 1139    Charlesetta Shanks, MD 02/10/19 1159

## 2019-06-11 ENCOUNTER — Emergency Department (HOSPITAL_BASED_OUTPATIENT_CLINIC_OR_DEPARTMENT_OTHER)
Admission: EM | Admit: 2019-06-11 | Discharge: 2019-06-11 | Disposition: A | Discharge status: Home / Self Care | Payer: Medicare HMO | Attending: Emergency Medicine | Admitting: Emergency Medicine

## 2019-06-11 ENCOUNTER — Encounter (HOSPITAL_BASED_OUTPATIENT_CLINIC_OR_DEPARTMENT_OTHER): Payer: Self-pay | Admitting: Emergency Medicine

## 2019-06-11 ENCOUNTER — Other Ambulatory Visit: Payer: Self-pay

## 2019-06-11 DIAGNOSIS — J0100 Acute maxillary sinusitis, unspecified: Secondary | ICD-10-CM | POA: Insufficient documentation

## 2019-06-11 DIAGNOSIS — E119 Type 2 diabetes mellitus without complications: Secondary | ICD-10-CM | POA: Insufficient documentation

## 2019-06-11 DIAGNOSIS — H9201 Otalgia, right ear: Secondary | ICD-10-CM

## 2019-06-11 DIAGNOSIS — R062 Wheezing: Secondary | ICD-10-CM | POA: Diagnosis not present

## 2019-06-11 DIAGNOSIS — R0981 Nasal congestion: Secondary | ICD-10-CM | POA: Diagnosis present

## 2019-06-11 DIAGNOSIS — Z79899 Other long term (current) drug therapy: Secondary | ICD-10-CM | POA: Insufficient documentation

## 2019-06-11 DIAGNOSIS — I10 Essential (primary) hypertension: Secondary | ICD-10-CM | POA: Insufficient documentation

## 2019-06-11 MED ORDER — METHYLPREDNISOLONE 4 MG PO TBPK: ORAL_TABLET | ORAL | 0 refills | Status: DC

## 2019-06-11 MED ORDER — KETOROLAC TROMETHAMINE 30 MG/ML IJ SOLN
30.0000 mg | Freq: Once | INTRAMUSCULAR | Status: AC
  Administered 2019-06-11: 23:00:00 30 mg via INTRAMUSCULAR
  Filled 2019-06-11: qty 1

## 2019-06-11 MED ORDER — AMOXICILLIN-POT CLAVULANATE 875-125 MG PO TABS: 1.0000 | ORAL_TABLET | Freq: Two times a day (BID) | ORAL | 0 refills | Status: DC

## 2019-06-11 MED ORDER — AMOXICILLIN-POT CLAVULANATE 875-125 MG PO TABS
1.0000 | ORAL_TABLET | Freq: Once | ORAL | Status: AC
  Administered 2019-06-11: 1 via ORAL
  Filled 2019-06-11: qty 1

## 2019-06-11 NOTE — Discharge Instructions (Addendum)
You were seen today for sinus pressure and earache.  Given the duration of your symptoms, you will be given a Medrol Dosepak and Augmentin for a sinus infection.  This will help your ear as well.  He should continue Flonase at home.  Add nasal saline.  Additionally, make sure that you are using your albuterol inhaler for your cough as you are wheezing on exam.

## 2019-06-11 NOTE — ED Provider Notes (Signed)
[facility] Provider Note   CSN: [id number] Arrival date & time: 06/11/19  2114     History   Chief Complaint Chief Complaint  Patient presents with  . Nasal Congestion    HPI [name] is a 43 y.o. female.     HPI  This a 43 year old female with a history of asthma, hypertension, seasonal allergies who presents with congestion, otalgia, and sinus pressure for 2 to 3 weeks.  Patient reports that she has had congestion and difficulty "popping my right ear."  She has been unable to clear her congestion with Flonase and over-the-counter allergy medication.  She states that her right ear is become progressively more painful.  She rates her pain at 7 out of 10.  She is not noted any fevers.  No known sick contacts or Covid exposures.  Denies loss of sense of taste and smell.  At baseline she has asthma.  She has not noted shortness of breath.  She reports a chronic cough that is nonproductive.  Past Medical History:  Diagnosis Date  . Anemia   . Asthma   . Blood transfusion without reported diagnosis   . Environmental allergies   . Fibroid, uterine   . Fibroids   . GERD (gastroesophageal reflux disease)   . Hypertension    no meds  . Menorrhagia   . Seasonal allergies    chronic  . Shortness of breath   . Sleep apnea    does not use CPAP    Patient Active Problem List   Diagnosis Date Noted  . 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 Complex cyst of left ovary 02/09/2018  . Post-operative state 02/08/2018  . Non compliance w medication regimen 12/08/2017  . Perennial and seasonal allergic rhinitis 02/22/2017  . Severe persistent asthma 02/22/2017  . Polyp of nasal cavity 02/22/2017  . Leukocytosis 10/15/2016  . Iron deficiency anemia due to chronic blood loss   . Symptomatic anemia   . Asthma exacerbation 10/11/2016  . Hypokalemia 10/11/2016  . Acute asthma 04/22/2016  . Acute sinusitis, unspecified 03/26/2014  .  Essential hypertension 12/08/2013  . 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 Fibroids 08/18/2012  . Menorrhagia 08/18/2012  . Extrinsic asthma 08/15/2011    Past Surgical History:  Procedure Laterality Date  . ABDOMINAL HYSTERECTOMY Bilateral 02/08/2018   Procedure: HYSTERECTOMY ABDOMINAL WITH LEFT SALPINGO-OOPORECTOMY AND RIGHT SALPINGECTOMY;  Surgeon: Lavonia Drafts, MD;  Location: [facility];  Service: Gynecology;  Laterality: Bilateral;  . CHOLECYSTECTOMY     06/29/2011  . CHOLECYSTECTOMY  06/29/2011   Procedure: LAPAROSCOPIC CHOLECYSTECTOMY WITH INTRAOPERATIVE CHOLANGIOGRAM;  Surgeon: Merrie Roof, MD;  Location: [facility];  Service: General;  Laterality: N/A;  . ECTOPIC PREGNANCY SURGERY     Laparoscopic  . ERCP  06/30/2011   Procedure: ENDOSCOPIC RETROGRADE CHOLANGIOPANCREATOGRAPHY (ERCP);  Surgeon: Jeryl Columbia, MD;  Location: [facility] END[facility]COPY;  Service: Endoscopy;  Laterality: N/A;  probable sphincterotomy  . ERCP  07/03/2011   Procedure: ENDOSCOPIC RETROGRADE CHOLANGIOPANCREATOGRAPHY (ERCP);  Surgeon: Landry Dyke, MD;  Location: [facility];  Service: Endoscopy;  Laterality: N/A;  PROPOFOL  . MYOMECTOMY  2014  . MYOMECTOMY N/A 11/14/2012   Procedure: MYOMECTOMY;  Surgeon: Osborne Oman, MD;  Location: [facility];  Service: Gynecology;  Laterality: N/A;  . NASAL POLYP EXCISION  2016   in [place]  . WISDOM TOOTH EXTRACTION       OB History  Gravida  1   Para  0   Term  0   Preterm  0   AB  1   Living  0     SAB  0   TAB  0   Ectopic  1   Multiple  0   Live Births  0            Home Medications    Prior to Admission medications   Medication Sig Start Date End Date Taking? Authorizing Provider  albuterol (PROVENTIL HFA;VENTOLIN HFA) 108 (90 Base) MCG/ACT inhaler Inhale 2 puffs into the lungs every 6 (six) hours as needed for wheezing or shortness of breath.    [provider]   amLODipine (NORVASC) 5 MG tablet Take 5 mg by mouth daily. 12/19/18   [provider]  Azelastine HCl 0.15 % SOLN Place 2 sprays 2 (two) times daily into both nostrils. 05/31/17   Valentina Shaggy, MD  budesonide (PULMICORT) 0.25 MG/2ML nebulizer solution Budesonide rinses twice daily 01/20/18   Valentina Shaggy, MD  Fe Fum-FePoly-FA-Vit C-Vit B3 (INTEGRA F) 125-1 MG CAPS Take 1 capsule by mouth daily. 02/10/18   Lavonia Drafts, MD  FLOVENT HFA 220 MCG/ACT inhaler TAKE 2 PUFFS BY MOUTH INTO THE LUNGS TWICE A DAY 03/28/18   Ambs, Kathrine Cords, FNP  fluticasone (FLONASE) 50 MCG/ACT nasal spray Place 2 sprays into the nose daily.  02/29/16   [provider]  ibuprofen (ADVIL,MOTRIN) 800 MG tablet Take 1 tablet (800 mg total) by mouth every 8 (eight) hours as needed. 02/10/18   Lavonia Drafts, MD  ipratropium-albuterol (DUONEB) 0.5-2.5 (3) MG/3ML SOLN Take 3 mLs every 6 (six) hours as needed by nebulization. 05/31/17   Valentina Shaggy, MD  methylPREDNISolone (MEDROL DOSEPAK) 4 MG TBPK tablet Per dose pack 02/10/19   Charlesetta Shanks, MD  montelukast (SINGULAIR) 10 MG tablet Take 1 tablet (10 mg total) at bedtime by mouth. 05/31/17   Valentina Shaggy, MD  SYMBICORT 160-4.5 MCG/ACT inhaler INHALE 2 PUFFS BY MOUTH TWICE DAILY 01/28/18   Bobbitt, Sedalia Muta, MD    Family History Family History  Problem Relation Age of Onset  . Hypertension Mother   . Diabetes Mother   . Hypertension Father   . Hypertension Brother   . Anesthesia problems Neg Hx   . Hypotension Neg Hx   . Malignant hyperthermia Neg Hx   . Pseudochol deficiency Neg Hx     Social History Social History   Tobacco Use  . Smoking status: Never Smoker  . Smokeless tobacco: Never Used  Substance Use Topics  . Alcohol use: Yes    Comment: occasional  . Drug use: No     Allergies   Bee venom and Prednisone   Review of Systems Review of Systems  Constitutional: Negative for fever.   HENT: Positive for congestion and ear pain. Negative for sore throat.   Respiratory: Positive for cough. Negative for shortness of breath.   Cardiovascular: Negative for chest pain.  Gastrointestinal: Negative for abdominal pain, nausea and vomiting.  Genitourinary: Negative for dysuria.  Neurological: Negative for dizziness and headaches.  All other systems reviewed and are negative.    Physical Exam Updated Vital Signs BP (!) 160/100 (BP Location: Left Arm)   Pulse 92   Temp 98.8 F (37.1 C) (Oral)   Resp 18   Ht 1.575 m (5\' 2" )   Wt 113.4 kg   LMP 12/19/2017 (Exact Date)   SpO2 100%   BMI  45.73 kg/m   Physical Exam Vitals signs and nursing note reviewed.  Constitutional:      Appearance: She is well-developed. She is obese. She is not ill-appearing.  HENT:     Head: Normocephalic and atraumatic.     Comments: Tenderness to palpation of the maxillary sinuses    Ears:     Comments: Right TM inflamed and erythematous, slightly bulging with diminished light reflex Left TM dull with slight effusion noted No mastoid erythema or tenderness    Nose: Congestion present.     Mouth/Throat:     Mouth: Mucous membranes are moist.  Eyes:     Pupils: Pupils are equal, round, and reactive to light.  Neck:     Musculoskeletal: Neck supple.  Cardiovascular:     Rate and Rhythm: Normal rate and regular rhythm.     Heart sounds: Normal heart sounds.  Pulmonary:     Effort: Pulmonary effort is normal. No respiratory distress.     Breath sounds: Wheezing present.     Comments: Good air movement with expiratory wheezing in all lung fields Abdominal:     General: Bowel sounds are normal.     Palpations: Abdomen is soft.  Musculoskeletal:     Right lower leg: No edema.     Left lower leg: No edema.  Lymphadenopathy:     Cervical: Cervical adenopathy present.  Skin:    General: Skin is warm and dry.  Neurological:     Mental Status: She is alert and oriented to person, place,  and time.  Psychiatric:        Mood and Affect: Mood normal.      ED Treatments / Results  Labs (all labs ordered are listed, but only abnormal results are displayed) Labs Reviewed - No data to display  EKG None  Radiology No results found.  Procedures Procedures (including critical care time)  Medications Ordered in ED Medications  amoxicillin-clavulanate (AUGMENTIN) 875-125 MG per tablet 1 tablet (has no administration in time range)  ketorolac (TORADOL) 30 MG/ML injection 30 mg (has no administration in time range)     Initial Impression / Assessment and Plan / ED Course  I have reviewed the triage vital signs and the nursing notes.  Pertinent labs & imaging results that were available during my care of the patient were reviewed by me and considered in my medical decision making (see chart for details).        Patient presents with congestion, right ear pain over the last several weeks.  She is overall nontoxic-appearing vital signs notable for blood pressure 160/100.  She does appear to have been taking appropriate medications including Flonase and over-the-counter sinus relief.  On exam she has inflammation of the right ear, sinus tenderness, and wheezing.  Given duration of symptoms, suspect she would benefit from steroids and antibiotics.  She has a prednisone allergy but has previously tolerated Solu-Medrol.  Her O2 sats are 100% on room air and she is in no respiratory distress.  She does have a chronic asthmatic cough and states that she knew that she was wheezing.  Patient was given Toradol for pain and a dose of Augmentin.  Will discharge with a Medrol Dosepak.  Patient encouraged to use her inhaler for wheezing and continue Flonase and nasal saline.  After history, exam, and medical workup I feel the patient has been appropriately medically screened and is safe for discharge home. Pertinent diagnoses were discussed with the patient. Patient was given return  precautions.   Final Clinical Impressions(s) / ED Diagnoses   Final diagnoses:  Acute non-recurrent maxillary sinusitis  Right ear pain  Wheezing    ED Discharge Orders    None       Merryl Hacker, MD 06/11/19 2322

## 2019-06-11 NOTE — ED Notes (Signed)
ED Provider at bedside. 

## 2019-06-11 NOTE — ED Triage Notes (Signed)
Patient states that she had had nasal congestion and facial pain x 3 weeks  - she is waiting to go to her PMD but states that the pain and pressure has gotten worse today

## 2019-11-02 ENCOUNTER — Encounter (HOSPITAL_BASED_OUTPATIENT_CLINIC_OR_DEPARTMENT_OTHER): Payer: Self-pay | Admitting: *Deleted

## 2019-11-02 ENCOUNTER — Emergency Department (HOSPITAL_BASED_OUTPATIENT_CLINIC_OR_DEPARTMENT_OTHER)
Admission: EM | Admit: 2019-11-02 | Discharge: 2019-11-02 | Disposition: A | Discharge status: Home / Self Care | Payer: Medicare HMO | Attending: Emergency Medicine | Admitting: Emergency Medicine

## 2019-11-02 ENCOUNTER — Other Ambulatory Visit: Payer: Self-pay

## 2019-11-02 ENCOUNTER — Emergency Department (HOSPITAL_BASED_OUTPATIENT_CLINIC_OR_DEPARTMENT_OTHER): Payer: Medicare HMO

## 2019-11-02 DIAGNOSIS — I10 Essential (primary) hypertension: Secondary | ICD-10-CM | POA: Insufficient documentation

## 2019-11-02 DIAGNOSIS — Z20822 Contact with and (suspected) exposure to covid-19: Secondary | ICD-10-CM | POA: Insufficient documentation

## 2019-11-02 DIAGNOSIS — R1084 Generalized abdominal pain: Secondary | ICD-10-CM | POA: Diagnosis not present

## 2019-11-02 DIAGNOSIS — E860 Dehydration: Secondary | ICD-10-CM | POA: Diagnosis not present

## 2019-11-02 DIAGNOSIS — N179 Acute kidney failure, unspecified: Secondary | ICD-10-CM

## 2019-11-02 DIAGNOSIS — J45909 Unspecified asthma, uncomplicated: Secondary | ICD-10-CM | POA: Insufficient documentation

## 2019-11-02 DIAGNOSIS — Z79899 Other long term (current) drug therapy: Secondary | ICD-10-CM | POA: Insufficient documentation

## 2019-11-02 DIAGNOSIS — R112 Nausea with vomiting, unspecified: Secondary | ICD-10-CM | POA: Insufficient documentation

## 2019-11-02 DIAGNOSIS — J189 Pneumonia, unspecified organism: Secondary | ICD-10-CM

## 2019-11-02 DIAGNOSIS — K529 Noninfective gastroenteritis and colitis, unspecified: Secondary | ICD-10-CM

## 2019-11-02 LAB — COMPREHENSIVE METABOLIC PANEL
ALT: 21 U/L (ref 0–44)
AST: 23 U/L (ref 15–41)
Albumin: 3.9 g/dL (ref 3.5–5.0)
Alkaline Phosphatase: 67 U/L (ref 38–126)
Anion gap: 12 (ref 5–15)
BUN: 24 mg/dL — ABNORMAL HIGH (ref 6–20)
CO2: 28 mmol/L (ref 22–32)
Calcium: 9 mg/dL (ref 8.9–10.3)
Chloride: 95 mmol/L — ABNORMAL LOW (ref 98–111)
Creatinine, Ser: 2 mg/dL — ABNORMAL HIGH (ref 0.44–1.00)
GFR calc Af Amer: 34 mL/min — ABNORMAL LOW (ref >60)
GFR calc non Af Amer: 30 mL/min — ABNORMAL LOW (ref >60)
Glucose, Bld: 112 mg/dL — ABNORMAL HIGH (ref 70–99)
Potassium: 3.5 mmol/L (ref 3.5–5.1)
Sodium: 135 mmol/L (ref 135–145)
Total Bilirubin: 0.3 mg/dL (ref 0.3–1.2)
Total Protein: 8.7 g/dL — ABNORMAL HIGH (ref 6.5–8.1)

## 2019-11-02 LAB — BASIC METABOLIC PANEL
Anion gap: 8 (ref 5–15)
BUN: 22 mg/dL — ABNORMAL HIGH (ref 6–20)
CO2: 27 mmol/L (ref 22–32)
Calcium: 8 mg/dL — ABNORMAL LOW (ref 8.9–10.3)
Chloride: 102 mmol/L (ref 98–111)
Creatinine, Ser: 1.81 mg/dL — ABNORMAL HIGH (ref 0.44–1.00)
GFR calc Af Amer: 39 mL/min — ABNORMAL LOW (ref >60)
GFR calc non Af Amer: 33 mL/min — ABNORMAL LOW (ref >60)
Glucose, Bld: 133 mg/dL — ABNORMAL HIGH (ref 70–99)
Potassium: 3.4 mmol/L — ABNORMAL LOW (ref 3.5–5.1)
Sodium: 137 mmol/L (ref 135–145)

## 2019-11-02 LAB — LIPASE, BLOOD: Lipase: 23 U/L (ref 11–51)

## 2019-11-02 LAB — CBC WITH DIFFERENTIAL/PLATELET
Abs Immature Granulocytes: 0.05 10*3/uL (ref 0.00–0.07)
Basophils Absolute: 0.1 10*3/uL (ref 0.0–0.1)
Basophils Relative: 0 %
Eosinophils Absolute: 1.4 10*3/uL — ABNORMAL HIGH (ref 0.0–0.5)
Eosinophils Relative: 8 %
HCT: 44.3 % (ref 36.0–46.0)
Hemoglobin: 13.4 g/dL (ref 12.0–15.0)
Immature Granulocytes: 0 %
Lymphocytes Relative: 15 %
Lymphs Abs: 2.5 10*3/uL (ref 0.7–4.0)
MCH: 22 pg — ABNORMAL LOW (ref 26.0–34.0)
MCHC: 30.2 g/dL (ref 30.0–36.0)
MCV: 72.6 fL — ABNORMAL LOW (ref 80.0–100.0)
Monocytes Absolute: 1 10*3/uL (ref 0.1–1.0)
Monocytes Relative: 6 %
Neutro Abs: 11.8 10*3/uL — ABNORMAL HIGH (ref 1.7–7.7)
Neutrophils Relative %: 71 %
Platelets: 498 10*3/uL — ABNORMAL HIGH (ref 150–400)
RBC: 6.1 MIL/uL — ABNORMAL HIGH (ref 3.87–5.11)
RDW: 18.6 % — ABNORMAL HIGH (ref 11.5–15.5)
WBC: 16.7 10*3/uL — ABNORMAL HIGH (ref 4.0–10.5)
nRBC: 0 % (ref 0.0–0.2)

## 2019-11-02 LAB — RESPIRATORY PANEL BY RT PCR (FLU A&B, COVID)
Influenza A by PCR: NEGATIVE
Influenza B by PCR: NEGATIVE
SARS Coronavirus 2 by RT PCR: NEGATIVE

## 2019-11-02 LAB — HCG, SERUM, QUALITATIVE: Preg, Serum: NEGATIVE

## 2019-11-02 MED ORDER — DOXYCYCLINE HYCLATE 100 MG PO CAPS: 100.0000 mg | ORAL_CAPSULE | Freq: Two times a day (BID) | ORAL | 0 refills | Status: DC

## 2019-11-02 MED ORDER — ONDANSETRON 4 MG PO TBDP: ORAL_TABLET | ORAL | 0 refills | Status: DC

## 2019-11-02 MED ORDER — ONDANSETRON HCL 4 MG/2ML IJ SOLN
INTRAMUSCULAR | Status: AC
  Filled 2019-11-02: qty 2

## 2019-11-02 MED ORDER — PROMETHAZINE HCL 25 MG/ML IJ SOLN
12.5000 mg | Freq: Once | INTRAMUSCULAR | Status: AC
  Administered 2019-11-02: 12.5 mg via INTRAVENOUS
  Filled 2019-11-02: qty 1

## 2019-11-02 MED ORDER — METHYLPREDNISOLONE SODIUM SUCC 125 MG IJ SOLR
125.0000 mg | Freq: Once | INTRAMUSCULAR | Status: AC
  Administered 2019-11-02: 125 mg via INTRAVENOUS
  Filled 2019-11-02: qty 2

## 2019-11-02 MED ORDER — DOXYCYCLINE HYCLATE 100 MG PO TABS
100.0000 mg | ORAL_TABLET | Freq: Once | ORAL | Status: AC
  Administered 2019-11-02: 100 mg via ORAL
  Filled 2019-11-02: qty 1

## 2019-11-02 MED ORDER — SODIUM CHLORIDE 0.9 % IV BOLUS
1000.0000 mL | Freq: Once | INTRAVENOUS | Status: AC
  Administered 2019-11-02: 1000 mL via INTRAVENOUS

## 2019-11-02 MED ORDER — KETOROLAC TROMETHAMINE 30 MG/ML IJ SOLN
30.0000 mg | Freq: Once | INTRAMUSCULAR | Status: AC
  Administered 2019-11-02: 30 mg via INTRAVENOUS
  Filled 2019-11-02: qty 1

## 2019-11-02 MED ORDER — ALBUTEROL SULFATE HFA 108 (90 BASE) MCG/ACT IN AERS
4.0000 | INHALATION_SPRAY | RESPIRATORY_TRACT | Status: DC | PRN
  Administered 2019-11-02: 4 via RESPIRATORY_TRACT
  Filled 2019-11-02: qty 6.7

## 2019-11-02 MED ORDER — ONDANSETRON HCL 4 MG/2ML IJ SOLN
4.0000 mg | Freq: Once | INTRAMUSCULAR | Status: AC
  Administered 2019-11-02: 4 mg via INTRAVENOUS
  Filled 2019-11-02: qty 2

## 2019-11-02 NOTE — ED Notes (Signed)
ED Provider at bedside. 

## 2019-11-02 NOTE — ED Provider Notes (Signed)
  Physical Exam  BP (!) 127/92 (BP Location: Right Arm)   Pulse (!) 102   Temp 97.9 F (36.6 C)   Resp 20   LMP 12/19/2017 (Exact Date)   SpO2 94%   Physical Exam  ED Course/Procedures     Procedures  MDM  Care assumed at 3 PM.  Patient here with gastroenteritis symptoms.  Her CT showed possible bronchitis.  She also has AKI and white count is 16.  Signout pending reassessment as well as chest x-ray and Covid test.  5:32 PM Her Covid PCR is negative.  Patient does have possible multifocal pneumonia on chest x-ray.  Given leukocytosis, will give a course of doxycycline .  Also will discharge home with Zofran for nausea.  Her initial creatinine was 2 and repeat is 1.8 .  She does have a primary care doctor follow-up so we will have her repeat BMP in a week.      Drenda Freeze, MD 11/02/19 [phone]

## 2019-11-02 NOTE — ED Notes (Signed)
Pt tolerating po fluids, no emesis.

## 2019-11-02 NOTE — Discharge Instructions (Signed)
Your labs showed that your kidney function is slightly abnormal and white blood cell count is elevated.  You need to stay hydrated and take Zofran as needed.  You need to repeat your kidney function and white blood cell count in a week with your doctor.  On your chest x-ray, there is a suggestion that you may have some pneumonia.  Please take doxycycline as prescribed.  See your doctor early next week.  Return to ER if you have worse vomiting, cough, shortness of breath, dehydration, trouble breathing, fever.

## 2019-11-02 NOTE — ED Triage Notes (Signed)
Vomiting since Monday.  Denies diarrhea.

## 2019-11-02 NOTE — ED Notes (Signed)
Pt returned from CT coughing, and c/o SOB. RT to bedside for eval.

## 2019-11-02 NOTE — ED Notes (Signed)
Patient transported to CT 

## 2019-11-02 NOTE — ED Provider Notes (Signed)
[facility]PART[facility] Provider Note   CSN: [id number] Arrival date & time: 11/02/19  1046     History Chief Complaint  Patient presents with  . Emesis    [name] is a 44 y.o. female.  Patient is a 44 year old female with past medical history of GERD, hypertension, asthma, and anemia.  She presents today for evaluation of nausea and vomiting.  This began 4 days ago and is worsening.  She denies diarrhea or bloody stool or vomit.  She denies fevers or chills.  She is having generalized abdominal cramping, but denies any focal tenderness.  She is here with her daughter who was ill in a similar fashion.  The history is provided by the patient.  Emesis Severity:  Moderate Duration:  4 days Timing:  Constant Quality:  Stomach contents Progression:  Worsening Recent urination:  Normal Relieved by:  Nothing Worsened by:  Nothing Ineffective treatments:  None tried Associated symptoms: no chills, no diarrhea and no fever        Past Medical History:  Diagnosis Date  . Anemia   . Asthma   . Blood transfusion without reported diagnosis   . Environmental allergies   . Fibroid, uterine   . Fibroids   . GERD (gastroesophageal reflux disease)   . Hypertension    no meds  . Menorrhagia   . Seasonal allergies    chronic  . Shortness of breath   . Sleep apnea    does not use CPAP    Patient Active Problem List   Diagnosis Date Noted  . 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 Complex cyst of left ovary 02/09/2018  . Post-operative state 02/08/2018  . Non compliance w medication regimen 12/08/2017  . Perennial and seasonal allergic rhinitis 02/22/2017  . Severe persistent asthma 02/22/2017  . Polyp of nasal cavity 02/22/2017  . Leukocytosis 10/15/2016  . Iron deficiency anemia due to chronic blood loss   . Symptomatic anemia   . Asthma exacerbation 10/11/2016  . Hypokalemia 10/11/2016  . Acute asthma 04/22/2016  . Acute sinusitis,  unspecified 03/26/2014  . Essential hypertension 12/08/2013  . 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 Fibroids 08/18/2012  . Menorrhagia 08/18/2012  . Extrinsic asthma 08/15/2011    Past Surgical History:  Procedure Laterality Date  . ABDOMINAL HYSTERECTOMY Bilateral 02/08/2018   Procedure: HYSTERECTOMY ABDOMINAL WITH LEFT SALPINGO-OOPORECTOMY AND RIGHT SALPINGECTOMY;  Surgeon: Lavonia Drafts, MD;  Location: [facility];  Service: Gynecology;  Laterality: Bilateral;  . CHOLECYSTECTOMY     06/29/2011  . CHOLECYSTECTOMY  06/29/2011   Procedure: LAPAROSCOPIC CHOLECYSTECTOMY WITH INTRAOPERATIVE CHOLANGIOGRAM;  Surgeon: Merrie Roof, MD;  Location: [facility];  Service: General;  Laterality: N/A;  . ECTOPIC PREGNANCY SURGERY     Laparoscopic  . ERCP  06/30/2011   Procedure: ENDOSCOPIC RETROGRADE CHOLANGIOPANCREATOGRAPHY (ERCP);  Surgeon: Jeryl Columbia, MD;  [facility];  Service: Endoscopy;  Laterality: N/A;  probable sphincterotomy  . ERCP  07/03/2011   Procedure: ENDOSCOPIC RETROGRADE CHOLANGIOPANCREATOGRAPHY (ERCP);  Surgeon: Landry Dyke, MD;  Location: [facility];  Service: Endoscopy;  Laterality: N/A;  PROPOFOL  . MYOMECTOMY  2014  . MYOMECTOMY N/A 11/14/2012   Procedure: MYOMECTOMY;  Surgeon: Osborne Oman, MD;  Location: [facility];  Service: Gynecology;  Laterality: N/A;  . NASAL POLYP EXCISION  2016   in [place]  . WISDOM TOOTH EXTRACTION       OB History  Gravida  1   Para  0   Term  0   Preterm  0   AB  1   Living  0     SAB  0   TAB  0   Ectopic  1   Multiple  0   Live Births  0           Family History  Problem Relation Age of Onset  . Hypertension Mother   . Diabetes Mother   . Hypertension Father   . Hypertension Brother   . Anesthesia problems Neg Hx   . Hypotension Neg Hx   . Malignant hyperthermia Neg Hx   . Pseudochol deficiency Neg Hx     Social History    Tobacco Use  . Smoking status: Never Smoker  . Smokeless tobacco: Never Used  Substance Use Topics  . Alcohol use: Yes    Comment: occasional  . Drug use: No    Home Medications Prior to Admission medications   Medication Sig Start Date End Date Taking? Authorizing Provider  albuterol (PROVENTIL HFA;VENTOLIN HFA) 108 (90 Base) MCG/ACT inhaler Inhale 2 puffs into the lungs every 6 (six) hours as needed for wheezing or shortness of breath.    [provider]  amLODipine (NORVASC) 5 MG tablet Take 5 mg by mouth daily. 12/19/18   [provider]  amoxicillin-clavulanate (AUGMENTIN) 875-125 MG tablet Take 1 tablet by mouth every 12 (twelve) hours. 06/11/19   Horton, Barbette Hair, MD  Azelastine HCl 0.15 % SOLN Place 2 sprays 2 (two) times daily into both nostrils. 05/31/17   Valentina Shaggy, MD  budesonide (PULMICORT) 0.25 MG/2ML nebulizer solution Budesonide rinses twice daily 01/20/18   Valentina Shaggy, MD  Fe Fum-FePoly-FA-Vit C-Vit B3 (INTEGRA F) 125-1 MG CAPS Take 1 capsule by mouth daily. 02/10/18   Lavonia Drafts, MD  FLOVENT HFA 220 MCG/ACT inhaler TAKE 2 PUFFS BY MOUTH INTO THE LUNGS TWICE A DAY 03/28/18   Ambs, Kathrine Cords, FNP  fluticasone (FLONASE) 50 MCG/ACT nasal spray Place 2 sprays into the nose daily.  02/29/16   [provider]  ibuprofen (ADVIL,MOTRIN) 800 MG tablet Take 1 tablet (800 mg total) by mouth every 8 (eight) hours as needed. 02/10/18   Lavonia Drafts, MD  ipratropium-albuterol (DUONEB) 0.5-2.5 (3) MG/3ML SOLN Take 3 mLs every 6 (six) hours as needed by nebulization. 05/31/17   Valentina Shaggy, MD  methylPREDNISolone (MEDROL DOSEPAK) 4 MG TBPK tablet Take as directed on packet 06/11/19   Horton, Barbette Hair, MD  montelukast (SINGULAIR) 10 MG tablet Take 1 tablet (10 mg total) at bedtime by mouth. 05/31/17   Valentina Shaggy, MD  SYMBICORT 160-4.5 MCG/ACT inhaler INHALE 2 PUFFS BY MOUTH TWICE DAILY 01/28/18    Bobbitt, Sedalia Muta, MD    Allergies    Bee venom and Prednisone  Review of Systems   Review of Systems  Constitutional: Negative for chills and fever.  Gastrointestinal: Positive for vomiting. Negative for diarrhea.  All other systems reviewed and are negative.   Physical Exam Updated Vital Signs BP (!) 140/98 (BP Location: Right Arm)   Pulse (!) 109   Temp 97.9 F (36.6 C)   LMP 12/19/2017 (Exact Date)   SpO2 96%   Physical Exam Vitals and nursing note reviewed.  Constitutional:      General: She is not in acute distress.    Appearance: She is well-developed. She is not diaphoretic.  HENT:     Head: Normocephalic and  atraumatic.  Cardiovascular:     Rate and Rhythm: Normal rate and regular rhythm.     Heart sounds: No murmur. No friction rub. No gallop.   Pulmonary:     Effort: Pulmonary effort is normal. No respiratory distress.     Breath sounds: Normal breath sounds. No wheezing.  Abdominal:     General: Bowel sounds are normal. There is no distension.     Palpations: Abdomen is soft.     Tenderness: There is abdominal tenderness. There is no right CVA tenderness, left CVA tenderness, guarding or rebound.     Comments: There is generalized abdominal tenderness, however no peritoneal signs.  Musculoskeletal:        General: Normal range of motion.     Cervical back: Normal range of motion and neck supple.  Skin:    General: Skin is warm and dry.  Neurological:     Mental Status: She is alert and oriented to person, place, and time.     ED Results / Procedures / Treatments   Labs (all labs ordered are listed, but only abnormal results are displayed) Labs Reviewed  COMPREHENSIVE METABOLIC PANEL  LIPASE, BLOOD  CBC WITH DIFFERENTIAL/PLATELET  HCG, SERUM, QUALITATIVE    EKG None  Radiology No results found.  Procedures Procedures (including critical care time)  Medications Ordered in ED Medications  sodium chloride 0.9 % bolus 1,000 mL (has no  administration in time range)  ondansetron (ZOFRAN) injection 4 mg (has no administration in time range)  ketorolac (TORADOL) 30 MG/ML injection 30 mg (has no administration in time range)    ED Course  I have reviewed the triage vital signs and the nursing notes.  Pertinent labs & imaging results that were available during my care of the patient were reviewed by me and considered in my medical decision making (see chart for details).    MDM Rules/Calculators/A&P  Patient presenting here with a 3-day history of nausea and vomiting.  Patient's abdominal exam is benign and vitals are stable.  Patient's work-up shows dehydration with acute renal failure.  Her creatinine is 2.0, which is up from 0.7.  Patient has been hydrated with 2 L of normal saline, given antiemetics.  Patient will receive a third liter of fluid and be reassessed.  Disposition will be determined at that time.  Care signed out to Dr. Darl Householder at shift change.  CRITICAL CARE Performed by: Veryl Speak Total critical care time: 45 minutes Critical care time was exclusive of separately billable procedures and treating other patients. Critical care was necessary to treat or prevent imminent or life-threatening deterioration. Critical care was time spent personally by me on the following activities: development of treatment plan with patient and/or surrogate as well as nursing, discussions with consultants, evaluation of patient's response to treatment, examination of patient, obtaining history from patient or surrogate, ordering and performing treatments and interventions, ordering and review of laboratory studies, ordering and review of radiographic studies, pulse oximetry and re-evaluation of patient's condition.   Final Clinical Impression(s) / ED Diagnoses Final diagnoses:  None    Rx / DC Orders ED Discharge Orders    None       Veryl Speak, MD 11/02/19 1521

## 2020-11-06 ENCOUNTER — Ambulatory Visit (INDEPENDENT_AMBULATORY_CARE_PROVIDER_SITE_OTHER): Payer: Medicare HMO | Admitting: Nurse Practitioner

## 2020-11-06 ENCOUNTER — Encounter (INDEPENDENT_AMBULATORY_CARE_PROVIDER_SITE_OTHER): Payer: Self-pay | Admitting: Nurse Practitioner

## 2020-11-06 ENCOUNTER — Other Ambulatory Visit: Payer: Self-pay

## 2020-11-06 VITALS — BP 124/86 | HR 83 | Temp 97.5°F | Ht 61.75 in | Wt 290.2 lb

## 2020-11-06 DIAGNOSIS — E66813 Obesity, class 3: Secondary | ICD-10-CM

## 2020-11-06 DIAGNOSIS — J3089 Other allergic rhinitis: Secondary | ICD-10-CM

## 2020-11-06 DIAGNOSIS — J4551 Severe persistent asthma with (acute) exacerbation: Secondary | ICD-10-CM | POA: Diagnosis not present

## 2020-11-06 DIAGNOSIS — Z6841 Body Mass Index (BMI) 40.0 and over, adult: Secondary | ICD-10-CM

## 2020-11-06 DIAGNOSIS — Z91030 Bee allergy status: Secondary | ICD-10-CM | POA: Diagnosis not present

## 2020-11-06 DIAGNOSIS — Z131 Encounter for screening for diabetes mellitus: Secondary | ICD-10-CM

## 2020-11-06 DIAGNOSIS — R739 Hyperglycemia, unspecified: Secondary | ICD-10-CM

## 2020-11-06 DIAGNOSIS — Z87892 Personal history of anaphylaxis: Secondary | ICD-10-CM

## 2020-11-06 DIAGNOSIS — I10 Essential (primary) hypertension: Secondary | ICD-10-CM | POA: Diagnosis not present

## 2020-11-06 DIAGNOSIS — Z13220 Encounter for screening for lipoid disorders: Secondary | ICD-10-CM

## 2020-11-06 MED ORDER — EPINEPHRINE 0.3 MG/0.3ML IJ SOAJ: 0.3000 mg | INTRAMUSCULAR | 2 refills | Status: DC | PRN

## 2020-11-06 MED ORDER — TRIAMTERENE-HCTZ 37.5-25 MG PO TABS: 1.0000 | ORAL_TABLET | Freq: Every day | ORAL | 1 refills | Status: DC

## 2020-11-06 MED ORDER — ALBUTEROL SULFATE HFA 108 (90 BASE) MCG/ACT IN AERS: 2.0000 | INHALATION_SPRAY | Freq: Four times a day (QID) | RESPIRATORY_TRACT | 3 refills | Status: DC | PRN

## 2020-11-06 MED ORDER — LEVOCETIRIZINE DIHYDROCHLORIDE 5 MG PO TABS: 5.0000 mg | ORAL_TABLET | Freq: Every day | ORAL | 1 refills | Status: DC

## 2020-11-06 MED ORDER — BUDESONIDE-FORMOTEROL FUMARATE 160-4.5 MCG/ACT IN AERO: 2.0000 | INHALATION_SPRAY | Freq: Two times a day (BID) | RESPIRATORY_TRACT | 3 refills | Status: DC

## 2020-11-06 MED ORDER — AMLODIPINE BESYLATE 10 MG PO TABS: 10.0000 mg | ORAL_TABLET | Freq: Every day | ORAL | 1 refills | Status: DC

## 2020-11-06 MED ORDER — CHLORPHEN-PE-ACETAMINOPHEN 4-10-325 MG PO TABS: 1.0000 | ORAL_TABLET | Freq: Two times a day (BID) | ORAL | 3 refills | Status: DC

## 2020-11-06 NOTE — Patient Instructions (Signed)
[facility] Dietary Recommendations for Weight Loss What to Avoid . Avoid added sugars o Often added sugar can be found in processed foods such as many condiments, dry cereals, cakes, cookies, chips, crisps, crackers, candies, sweetened drinks, etc.  o Read labels and AVOID/DECREASE use of foods with the following in their ingredient list: Sugar, fructose, high fructose corn syrup, sucrose, glucose, maltose, dextrose, molasses, cane sugar, brown sugar, any type of syrup, agave nectar, etc.   . Avoid snacking in between meals . Avoid foods made with flour o If you are going to eat food made with flour, choose those made with whole-grains; and, minimize your consumption as much as is tolerable . Avoid processed foods o These foods are generally stocked in the middle of the grocery store. Focus on shopping on the perimeter of the grocery.  . Avoid Meat  o We recommend following a plant-based diet at [facility]. Thus, we recommend avoiding meat as a general rule. Consider eating beans, legumes, eggs, and/or dairy products for regular protein sources o If you plan on eating meat limit to 4 ounces of meat at a time and choose lean options such as Fish, chicken, turkey. Avoid red meat intake such as pork and/or steak What to Include . Vegetables o GREEN LEAFY VEGETABLES: Kale, spinach, mustard greens, collard greens, cabbage, broccoli, etc. o OTHER: Asparagus, cauliflower, eggplant, carrots, peas, Brussel sprouts, tomatoes, bell peppers, zucchini, beets, cucumbers, etc. . Grains, seeds, and legumes o Beans: kidney beans, black eyed peas, garbanzo beans, black beans, pinto beans, etc. o Whole, unrefined grains: brown rice, barley, bulgur, oatmeal, etc. . Healthy fats  o Avoid highly processed fats such as vegetable oil o Examples of healthy fats: avocado, olives, virgin olive oil, dark chocolate (?72% Cocoa), nuts (peanuts, almonds, walnuts, cashews, pecans, etc.) . None to Low  Intake of Animal Sources of Protein o Meat sources: chicken, turkey, salmon, tuna. Limit to 4 ounces of meat at one time. o Consider limiting dairy sources, but when choosing dairy focus on: PLAIN Greek yogurt, cottage cheese, high-protein milk . Fruit o Choose berries  When to Eat . Intermittent Fasting: o Choosing not to eat for a specific time period, but DO FOCUS ON HYDRATION when fasting o Multiple Techniques: - Time Restricted Eating: eat 3 meals in a day, each meal lasting no more than 60 minutes, no snacks between meals - 16-18 hour fast: fast for 16 to 18 hours up to 7 days a week. Often suggested to start with 2-3 nonconsecutive days per week.  . Remember the time you sleep is counted as fasting.  . Examples of eating schedule: Fast from 7:00pm-11:00am. Eat between 11:00am-7:00pm.  - 24-hour fast: fast for 24 hours up to every other day. Often suggested to start with 1 day per week . Remember the time you sleep is counted as fasting . Examples of eating schedule:  o Eating day: eat 2-3 meals on your eating day. If doing 2 meals, each meal should last no more than 90 minutes. If doing 3 meals, each meal should last no more than 60 minutes. Finish last meal by 7:00pm. o Fasting day: Fast until 7:00pm.  o IF YOU FEEL UNWELL FOR ANY REASON/IN ANY WAY WHEN FASTING, STOP FASTING BY EATING A NUTRITIOUS SNACK OR LIGHT MEAL o ALWAYS FOCUS ON HYDRATION DURING FASTS - Acceptable Hydration sources: water, broths, tea/coffee (black tea/coffee is best but using a small amount of whole-fat dairy products in coffee/tea is acceptable).  -   Poor Hydration Sources: anything with sugar or artificial sweeteners added to it  These recommendations have been developed for patients that are actively receiving medical care from either Dr. Gos[facility] or Bonifacio Pruden, DNP, NP-C at [facility]. These recommendations are developed for patients with specific medical conditions and are not meant to be  distributed or used by others that are not actively receiving care from either provider listed above at [facility]. It is not appropriate to participate in the above eating plans without proper medical supervision.   Reference: Fung, J. The obesity code. [place]: [place]; 2016.   

## 2020-11-06 NOTE — Progress Notes (Addendum)
Subjective:  Patient ID: [name], female    DOB: [birth year]  Age: 45 y.o. MRN: [id number]  CC:  Chief Complaint  Patient presents with  . Establish Care    Doing okay      HPI  This patient arrives today for the above.  She tells me that she is changing primary care providers because she has been having a hard time getting into her previous primary care provider which she thinks may be related to all of the changes that have occurred in healthcare secondary to the COVID-19 pandemic.  She was referred to [place] from a friend and wanted to establish care here.  Her main concern is she needs some refills on medications today specifically her hypertensive medications and asthma medications.  Per chart review I do see that she had reduced renal function approximately 1 year ago, about 3 months prior to that her renal function appeared to be fairly normal.  She tells me she is not aware that she may have some kidney dysfunction.  Other medical history is as listed below.  Past Medical History:  Diagnosis Date  . Anemia   . Asthma   . Blood transfusion without reported diagnosis   . Environmental allergies   . Fibroid, uterine   . Fibroids   . GERD (gastroesophageal reflux disease)   . Hypertension    no meds  . Menorrhagia   . Seasonal allergies    chronic  . Shortness of breath   . Sleep apnea    does not use CPAP      Family History  Problem Relation Age of Onset  . Hypertension Mother   . Diabetes Mother   . Hypertension Father   . Hypertension Brother   . Anesthesia problems Neg Hx   . Hypotension Neg Hx   . Malignant hyperthermia Neg Hx   . Pseudochol deficiency Neg Hx     Social History   Social History Narrative  . Not on file   Social History   Tobacco Use  . Smoking status: Never Smoker  . Smokeless tobacco: Never Used  Substance Use Topics  . Alcohol use: Yes    Comment: occasional     Current Meds  Medication Sig  . albuterol  (PROVENTIL) (2.5 MG/3ML) 0.083% nebulizer solution Take 2.5 mg by nebulization every 6 (six) hours as needed for wheezing or shortness of breath.  . budesonide (PULMICORT) 0.25 MG/2ML nebulizer solution Budesonide rinses twice daily  . Ibuprofen 200 MG CAPS Take 200 mg by mouth as needed.  Marland Kitchen ipratropium-albuterol (DUONEB) 0.5-2.5 (3) MG/3ML SOLN Take 3 mLs every 6 (six) hours as needed by nebulization.  . NEOMYCIN-POLYMYXIN-HYDROCORTISONE (CORTISPORIN) 1 % SOLN OTIC solution Place three drops into the right ear 3 (three) times a day.  Marland Kitchen omeprazole (PRILOSEC) 20 MG capsule Take by mouth.  . [DISCONTINUED] albuterol (PROVENTIL HFA;VENTOLIN HFA) 108 (90 Base) MCG/ACT inhaler Inhale 2 puffs into the lungs every 6 (six) hours as needed for wheezing or shortness of breath.  . [DISCONTINUED] amLODipine (NORVASC) 10 MG tablet Take by mouth.  . [DISCONTINUED] Chlorphen-PE-Acetaminophen 4-10-325 MG TABS Take 1 tablet by mouth 2 (two) times daily.  . [DISCONTINUED] EPINEPHrine 0.3 mg/0.3 mL IJ SOAJ injection Inject into the muscle.  . [DISCONTINUED] levocetirizine (XYZAL) 5 MG tablet Take by mouth.  . [DISCONTINUED] montelukast (SINGULAIR) 10 MG tablet Take 1 tablet (10 mg total) at bedtime by mouth.  . [DISCONTINUED] SYMBICORT 160-4.5 MCG/ACT inhaler INHALE 2 PUFFS  BY MOUTH TWICE DAILY  . [DISCONTINUED] triamterene-hydrochlorothiazide (MAXZIDE-25) 37.5-25 MG tablet Take 1 tablet by mouth daily.   Current Facility-Administered Medications for the 11/06/20 encounter (Office Visit) with Ailene Ards, NP  Medication  . omalizumab Arvid Right) injection 375 mg    ROS:  No voiced concerns related to symptoms today.   Objective:   Today's Vitals: BP 124/86   Pulse 83   Temp (!) 97.5 F (36.4 C) (Temporal)   Ht 5' 1.75" (1.568 m)   Wt 290 lb 3.2 oz (131.6 kg)   LMP 12/19/2017 (Exact Date)   SpO2 98%   BMI 53.51 kg/m  Vitals with BMI 11/06/2020 11/02/2019 11/02/2019  Height 5' 1.75" - -  Weight 290 lbs 3  oz - -  BMI 21.30 - -  Systolic 865 784 696  Diastolic 86 80 92  Pulse 83 88 102     Physical Exam Vitals reviewed.  Constitutional:      General: She is not in acute distress.    Appearance: Normal appearance.  HENT:     Head: Normocephalic and atraumatic.  Neck:     Vascular: No carotid bruit.  Cardiovascular:     Rate and Rhythm: Normal rate and regular rhythm.     Pulses: Normal pulses.     Heart sounds: Normal heart sounds.  Pulmonary:     Effort: Pulmonary effort is normal.     Breath sounds: Normal breath sounds.  Skin:    General: Skin is warm and dry.  Neurological:     General: No focal deficit present.     Mental Status: She is alert and oriented to person, place, and time.  Psychiatric:        Mood and Affect: Mood normal.        Behavior: Behavior normal.        Judgment: Judgment normal.          Assessment and Plan   1. Essential hypertension   2. Perennial and seasonal allergic rhinitis   3. Severe persistent asthma with acute exacerbation   4. Allergic to bees   5. History of anaphylaxis   6. Class 3 severe obesity with serious comorbidity and body mass index (BMI) of 50.0 to 59.9 in adult, unspecified obesity type ([place])   7. Screening for diabetes mellitus   8. Screening, lipid   9. Hyperglycemia      Plan: 1.-9.  I will refill her medications as requested today.  We will check blood work including CBC, CMP, lipid panel, TSH and A1c today.  Further recommendations will be made based upon these results.  She will follow-up with me in approximately 1 month, and she did mention she is not aware of having had her annual wellness visit within the last year so we will try to get her scheduled for an annual wellness visit as well.   Tests ordered Orders Placed This Encounter  Procedures  . CBC with Differential/Platelets  . CMP with eGFR([facility])  . Hemoglobin A1c  . Lipid Panel  . TSH      Meds ordered this encounter  Medications  .  amLODipine (NORVASC) 10 MG tablet    Sig: Take 1 tablet (10 mg total) by mouth daily.    Dispense:  90 tablet    Refill:  1    Order Specific Question:   Supervising Provider    Answer:   Hurshel Party C [[id number]]  . triamterene-hydrochlorothiazide (MAXZIDE-25) 37.5-25 MG tablet    Sig: Take  1 tablet by mouth daily.    Dispense:  90 tablet    Refill:  1    Order Specific Question:   Supervising Provider    Answer:   Hurshel Party C [[id number]]  . albuterol (VENTOLIN HFA) 108 (90 Base) MCG/ACT inhaler    Sig: Inhale 2 puffs into the lungs every 6 (six) hours as needed for wheezing or shortness of breath.    Dispense:  18 g    Refill:  3    Order Specific Question:   Supervising Provider    Answer:   Anastasio Champion, NIMISH C [[id number]]  . budesonide-formoterol (SYMBICORT) 160-4.5 MCG/ACT inhaler    Sig: Inhale 2 puffs into the lungs 2 (two) times daily.    Dispense:  30.6 each    Refill:  3    Order Specific Question:   Supervising Provider    Answer:   Anastasio Champion, NIMISH C [[id number]]  . Chlorphen-PE-Acetaminophen 4-10-325 MG TABS    Sig: Take 1 tablet by mouth 2 (two) times daily.    Dispense:  84 tablet    Refill:  3    Order Specific Question:   Supervising Provider    Answer:   Hurshel Party C [[id number]]  . EPINEPHrine 0.3 mg/0.3 mL IJ SOAJ injection    Sig: Inject 0.3 mg into the muscle as needed for anaphylaxis.    Dispense:  1 each    Refill:  2    Order Specific Question:   Supervising Provider    Answer:   Hurshel Party C [[id number]]  . levocetirizine (XYZAL) 5 MG tablet    Sig: Take 1 tablet (5 mg total) by mouth daily.    Dispense:  90 tablet    Refill:  1    Order Specific Question:   Supervising Provider    Answer:   Doree Albee [[id number]]    Patient to follow-up in 1 month or sooner as needed.  Ailene Ards, NP

## 2020-11-07 LAB — COMPLETE METABOLIC PANEL WITH GFR
AG Ratio: 1.2 (calc) (ref 1.0–2.5)
ALT: 22 U/L (ref 6–29)
AST: 19 U/L (ref 10–35)
Albumin: 3.9 g/dL (ref 3.6–5.1)
Alkaline phosphatase (APISO): 69 U/L (ref 31–125)
BUN: 12 mg/dL (ref 7–25)
CO2: 27 mmol/L (ref 20–32)
Calcium: 9.5 mg/dL (ref 8.6–10.2)
Chloride: 102 mmol/L (ref 98–110)
Creat: 0.72 mg/dL (ref 0.50–1.10)
GFR, Est African American: 117 mL/min/{1.73_m2} (ref >=60)
GFR, Est Non African American: 101 mL/min/{1.73_m2} (ref >=60)
Globulin: 3.3 g/dL (calc) (ref 1.9–3.7)
Glucose, Bld: 76 mg/dL (ref 65–139)
Potassium: 4.3 mmol/L (ref 3.5–5.3)
Sodium: 138 mmol/L (ref 135–146)
Total Bilirubin: 0.3 mg/dL (ref 0.2–1.2)
Total Protein: 7.2 g/dL (ref 6.1–8.1)

## 2020-11-07 LAB — CBC WITH DIFFERENTIAL/PLATELET
Absolute Monocytes: 676 cells/uL (ref 200–950)
Basophils Absolute: 91 cells/uL (ref 0–200)
Basophils Relative: 0.7 %
Eosinophils Absolute: 1235 cells/uL — ABNORMAL HIGH (ref 15–500)
Eosinophils Relative: 9.5 %
HCT: 38.9 % (ref 35.0–45.0)
Hemoglobin: 12 g/dL (ref 11.7–15.5)
Lymphs Abs: 2886 cells/uL (ref 850–3900)
MCH: 22.9 pg — ABNORMAL LOW (ref 27.0–33.0)
MCHC: 30.8 g/dL — ABNORMAL LOW (ref 32.0–36.0)
MCV: 74.1 fL — ABNORMAL LOW (ref 80.0–100.0)
MPV: 9.8 fL (ref 7.5–12.5)
Monocytes Relative: 5.2 %
Neutro Abs: 8112 cells/uL — ABNORMAL HIGH (ref 1500–7800)
Neutrophils Relative %: 62.4 %
Platelets: 378 10*3/uL (ref 140–400)
RBC: 5.25 10*6/uL — ABNORMAL HIGH (ref 3.80–5.10)
RDW: 15.2 % — ABNORMAL HIGH (ref 11.0–15.0)
Total Lymphocyte: 22.2 %
WBC: 13 10*3/uL — ABNORMAL HIGH (ref 3.8–10.8)

## 2020-11-07 LAB — LIPID PANEL
Cholesterol: 205 mg/dL — ABNORMAL HIGH (ref <200)
HDL: 52 mg/dL (ref >=50)
LDL Cholesterol (Calc): 131 mg/dL (calc) — ABNORMAL HIGH
Non-HDL Cholesterol (Calc): 153 mg/dL (calc) — ABNORMAL HIGH (ref <130)
Total CHOL/HDL Ratio: 3.9 (calc) (ref <5.0)
Triglycerides: 117 mg/dL (ref <150)

## 2020-11-07 LAB — HEMOGLOBIN A1C
Hgb A1c MFr Bld: 5.8 % of total Hgb — ABNORMAL HIGH (ref <5.7)
Mean Plasma Glucose: 120 mg/dL
eAG (mmol/L): 6.6 mmol/L

## 2020-11-07 LAB — TSH: TSH: 2.31 mIU/L

## 2020-11-12 ENCOUNTER — Other Ambulatory Visit (INDEPENDENT_AMBULATORY_CARE_PROVIDER_SITE_OTHER): Payer: Self-pay | Admitting: Internal Medicine

## 2020-11-12 DIAGNOSIS — Z87892 Personal history of anaphylaxis: Secondary | ICD-10-CM

## 2020-11-12 DIAGNOSIS — J3089 Other allergic rhinitis: Secondary | ICD-10-CM

## 2020-11-12 DIAGNOSIS — J4551 Severe persistent asthma with (acute) exacerbation: Secondary | ICD-10-CM

## 2020-11-12 DIAGNOSIS — Z91030 Bee allergy status: Secondary | ICD-10-CM

## 2020-11-12 MED ORDER — EPINEPHRINE 0.3 MG/0.3ML IJ SOAJ: 0.3000 mg | INTRAMUSCULAR | 2 refills | Status: DC | PRN

## 2020-11-12 MED ORDER — BUDESONIDE-FORMOTEROL FUMARATE 160-4.5 MCG/ACT IN AERO: 2.0000 | INHALATION_SPRAY | Freq: Two times a day (BID) | RESPIRATORY_TRACT | 3 refills | Status: DC

## 2020-11-16 ENCOUNTER — Other Ambulatory Visit: Payer: Self-pay

## 2020-11-16 ENCOUNTER — Encounter (HOSPITAL_BASED_OUTPATIENT_CLINIC_OR_DEPARTMENT_OTHER): Payer: Self-pay | Admitting: Emergency Medicine

## 2020-11-16 ENCOUNTER — Emergency Department (HOSPITAL_BASED_OUTPATIENT_CLINIC_OR_DEPARTMENT_OTHER): Payer: Medicare HMO

## 2020-11-16 ENCOUNTER — Emergency Department (HOSPITAL_BASED_OUTPATIENT_CLINIC_OR_DEPARTMENT_OTHER)
Admission: EM | Admit: 2020-11-16 | Discharge: 2020-11-16 | Disposition: A | Discharge status: Home / Self Care | Payer: Medicare HMO | Attending: Emergency Medicine | Admitting: Emergency Medicine

## 2020-11-16 DIAGNOSIS — M25561 Pain in right knee: Secondary | ICD-10-CM | POA: Insufficient documentation

## 2020-11-16 DIAGNOSIS — J45901 Unspecified asthma with (acute) exacerbation: Secondary | ICD-10-CM | POA: Diagnosis not present

## 2020-11-16 DIAGNOSIS — I10 Essential (primary) hypertension: Secondary | ICD-10-CM | POA: Diagnosis not present

## 2020-11-16 MED ORDER — IBUPROFEN 600 MG PO TABS: 600.0000 mg | ORAL_TABLET | Freq: Four times a day (QID) | ORAL | 0 refills | Status: DC | PRN

## 2020-11-16 MED ORDER — DICLOFENAC SODIUM 1 % EX GEL: 2.0000 g | Freq: Four times a day (QID) | CUTANEOUS | 0 refills | Status: DC | PRN

## 2020-11-16 NOTE — ED Notes (Signed)
Patient transported to X-ray 

## 2020-11-16 NOTE — ED Provider Notes (Signed)
Emergency Department Provider Note   I have reviewed the triage vital signs and the nursing notes.   HISTORY  Chief Complaint Knee Pain   HPI [name] is a 45 y.o. female with PMH reviewed below presents to the ED with right knee pain for the last 2 weeks. She denies known injury. Notes some subjective swelling to the knee and is feeling a "pop" especially when laying flat and flexing the knee. Denies any fever, chills, or knee warmth/redness. No ankle or hip pain. Pain is moderate and worse with ambulation. No radiation of symptoms.   Past Medical History:  Diagnosis Date  . Anemia   . Asthma   . Blood transfusion without reported diagnosis   . Environmental allergies   . Fibroid, uterine   . Fibroids   . GERD (gastroesophageal reflux disease)   . Hypertension    no meds  . Menorrhagia   . Seasonal allergies    chronic  . Shortness of breath   . Sleep apnea    does not use CPAP    Patient Active Problem List   Diagnosis Date Noted  . 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 Complex cyst of left ovary 02/09/2018  . Post-operative state 02/08/2018  . Non compliance w medication regimen 12/08/2017  . Perennial and seasonal allergic rhinitis 02/22/2017  . Severe persistent asthma 02/22/2017  . Polyp of nasal cavity 02/22/2017  . Leukocytosis 10/15/2016  . Iron deficiency anemia due to chronic blood loss   . Symptomatic anemia   . Asthma exacerbation 10/11/2016  . Hypokalemia 10/11/2016  . Acute asthma 04/22/2016  . Acute sinusitis, unspecified 03/26/2014  . Essential hypertension 12/08/2013  . 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 Fibroids 08/18/2012  . Menorrhagia 08/18/2012  . Extrinsic asthma 08/15/2011    Past Surgical History:  Procedure Laterality Date  . ABDOMINAL HYSTERECTOMY Bilateral 02/08/2018   Procedure: HYSTERECTOMY ABDOMINAL WITH LEFT SALPINGO-OOPORECTOMY AND RIGHT  SALPINGECTOMY;  Surgeon: Lavonia Drafts, MD;  Location: [facility];  Service: Gynecology;  Laterality: Bilateral;  . CHOLECYSTECTOMY     06/29/2011  . CHOLECYSTECTOMY  06/29/2011   Procedure: LAPAROSCOPIC CHOLECYSTECTOMY WITH INTRAOPERATIVE CHOLANGIOGRAM;  Surgeon: Merrie Roof, MD;  Location: [facility];  Service: General;  Laterality: N/A;  . ECTOPIC PREGNANCY SURGERY     Laparoscopic  . ERCP  06/30/2011   Procedure: ENDOSCOPIC RETROGRADE CHOLANGIOPANCREATOGRAPHY (ERCP);  Surgeon: Jeryl Columbia, MD;  Location: [facility];  Service: Endoscopy;  Laterality: N/A;  probable sphincterotomy  . ERCP  07/03/2011   Procedure: ENDOSCOPIC RETROGRADE CHOLANGIOPANCREATOGRAPHY (ERCP);  Surgeon: Landry Dyke, MD;  Location: [facility];  Service: Endoscopy;  Laterality: N/A;  PROPOFOL  . MYOMECTOMY  2014  . MYOMECTOMY N/A 11/14/2012   Procedure: MYOMECTOMY;  Surgeon: Osborne Oman, MD;  Location: [facility];  Service: Gynecology;  Laterality: N/A;  . NASAL POLYP EXCISION  2016   in [place]  . WISDOM TOOTH EXTRACTION      Allergies Bee venom and Prednisone  Family History  Problem Relation Age of Onset  . Hypertension Mother   . Diabetes Mother   . Hypertension Father   . Hypertension Brother   . Anesthesia problems Neg Hx   . Hypotension Neg Hx   . Malignant hyperthermia Neg Hx   . Pseudochol deficiency Neg Hx     Social History Social History   Tobacco Use  . Smoking status: Never Smoker  .  Smokeless tobacco: Never Used  Vaping Use  . Vaping Use: Never used  Substance Use Topics  . Alcohol use: Yes    Comment: occasional  . Drug use: No    Review of Systems  Constitutional: No fever/chills Musculoskeletal: Positive right knee pain.  Skin: Negative for rash.   ____________________________________________   PHYSICAL EXAM:  VITAL SIGNS: ED Triage Vitals  Enc Vitals Group     BP 11/16/20 0509 (!) 145/90     Pulse Rate 11/16/20 0509 77     Resp 11/16/20 0509 16      Temp 11/16/20 0509 99.3 F (37.4 C)     Temp Source 11/16/20 0509 Oral     SpO2 11/16/20 0509 98 %     Weight 11/16/20 0505 292 lb 1.6 oz (132.5 kg)     Height 11/16/20 0505 5\' 2"  (1.575 m)   Constitutional: Alert and oriented. Well appearing and in no acute distress. Eyes: Conjunctivae are normal. Head: Atraumatic. Nose: No congestion/rhinnorhea. Mouth/Throat: Mucous membranes are moist.   Neck: No stridor.   Cardiovascular: Good peripheral circulation.  Respiratory: Normal respiratory effort.   Gastrointestinal: No distention.  Musculoskeletal: No appreciable joint swelling compared to the left. No warmth or redness. ROM slightly limited by pain. Mild joint crepitus with flexion. No joint laxity.  Neurologic:  Normal speech and language.  Skin:  Skin is warm, dry and intact. No rash noted.  ____________________________________________  RADIOLOGY  DG Knee Complete 4 Views Right  Result Date: 11/16/2020 CLINICAL DATA:  Knee pain. Worse with increasing activity. Felt pop. EXAM: RIGHT KNEE - COMPLETE 4+ VIEW COMPARISON:  X-ray knee right side 11/21/2008 FINDINGS: No evidence of fracture, dislocation, or joint effusion. No evidence of severe arthropathy. No aggressive appearing focal bone abnormality. Soft tissues are unremarkable. IMPRESSION: Negative. Electronically Signed   By: Iven Finn M.D.   On: 11/16/2020 05:44    ____________________________________________   PROCEDURES  Procedure(s) performed:   Procedures  None  ____________________________________________   INITIAL IMPRESSION / ASSESSMENT AND PLAN / ED COURSE  Pertinent labs & imaging results that were available during my care of the patient were reviewed by me and considered in my medical decision making (see chart for details).   Patient presents to the ED with right knee pain and swelling. Exam not consistent with septic joint. Some mild crepitus noted. No joint laxity. Plan for plain films to evaluate  for bony lesion or hairline fx. Exam not consistent with DVT or Baker's cyst.   Plain films reviewed. No acute findings. Discussed mgmt as an outpatient and sports med follow up.  ____________________________________________  FINAL CLINICAL IMPRESSION(S) / ED DIAGNOSES  Final diagnoses:  Acute pain of right knee     NEW OUTPATIENT MEDICATIONS STARTED DURING THIS VISIT:  New Prescriptions   DICLOFENAC SODIUM (VOLTAREN) 1 % GEL    Apply 2 g topically 4 (four) times daily as needed.   IBUPROFEN (ADVIL) 600 MG TABLET    Take 1 tablet (600 mg total) by mouth every 6 (six) hours as needed.    Note:  This document was prepared using Dragon voice recognition software and may include unintentional dictation errors.  Nanda Quinton, MD, [facility] Emergency Medicine    Mahlia Fernando, [facility], MD 11/16/20 [phone]

## 2020-11-16 NOTE — ED Notes (Addendum)
C/o R knee pain x2 months but worsening past few weeks. States she's been trying to manage symptoms w/o medications but just took a Tylenol yesterday. No known injuries to area. 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 Pt able to bend knee w/ noticeable discomfort. Pt able to ambulate w/o assistance.

## 2020-11-16 NOTE — ED Triage Notes (Signed)
Pt is c/o right knee pain x 2 months  Pt denies injury  Pt states she has swelling to her knee  Pt states the pain is worse with increased activity and she can feel it pop when she bends it

## 2020-11-16 NOTE — Discharge Instructions (Signed)
You were seen in the ED today with knee pain. Your x-rays are normal. Please try RICE therapy as outlined in the handouts attached here. I would also like you to call the sports medicine specialist listed for an appointment.

## 2020-11-18 ENCOUNTER — Other Ambulatory Visit (INDEPENDENT_AMBULATORY_CARE_PROVIDER_SITE_OTHER): Payer: Self-pay | Admitting: Nurse Practitioner

## 2020-11-18 ENCOUNTER — Telehealth (INDEPENDENT_AMBULATORY_CARE_PROVIDER_SITE_OTHER): Payer: Self-pay

## 2020-11-18 ENCOUNTER — Other Ambulatory Visit (INDEPENDENT_AMBULATORY_CARE_PROVIDER_SITE_OTHER): Payer: Self-pay

## 2020-11-18 DIAGNOSIS — J4551 Severe persistent asthma with (acute) exacerbation: Secondary | ICD-10-CM

## 2020-11-18 DIAGNOSIS — M25561 Pain in right knee: Secondary | ICD-10-CM

## 2020-11-18 DIAGNOSIS — J3089 Other allergic rhinitis: Secondary | ICD-10-CM

## 2020-11-18 DIAGNOSIS — I10 Essential (primary) hypertension: Secondary | ICD-10-CM

## 2020-11-18 NOTE — Telephone Encounter (Signed)
Ordered referral, did not specify group or location in the referral, but if she would like to go to [place], please send there. Thank you.

## 2020-11-18 NOTE — Telephone Encounter (Signed)
Okay thank you Will work on and ask her which provider she would like.

## 2020-11-18 NOTE — Telephone Encounter (Signed)
.  Transition Care Management Follow-up Telephone Call  Date of discharge and from where:11/16/2020  How have you been since you were released from the hospital?Still hurts. Was told to get a further imaging from  Ortho. Because they can hear popping. R/o fracture.  Any questions or concerns? No, just to find out what is going on with the knee.  Items Reviewed:  Did the pt receive and understand the discharge instructions provided?Yes, gave pain meds. But I did not pick up.   Medications obtained and verified? yes  Other? Given ace bandage for leg to wrap.   Any new allergies since your discharge? no  Dietary orders reviewed? None  Do you have support at home? Yes, family.   Home Care and Equipment/Supplies: Were home health services ordered? N/a  If so, what is the name of the agency?n/a  Has the agency set up a time to come to the patient's home? yes Were any new equipment or medical supplies ordered?  No  What is the name of the medical supply agency? no Were you able to get the supplies/equipment? no Do you have any questions related to the use of the equipment or supplies? no  Functional Questionnaire: (I = Independent and D = Dependent) ADLs: I  Bathing/Dressing-I  Meal Prep-I  Eating- I   Maintaining continence-I  Transferring/Ambulation- I  Managing Meds-I  Follow up appointments reviewed:   PCP Hospital f/u appt confirmed? Yes  Scheduled to see Jeralyn Ruths, NP  on 12/11/20 @11 :30AM. Would like to change to  The afternoon later.   [facility] f/u appt confirmed? No, referred by ED.   Are transportation arrangements needed? Yes   If their condition worsens, is the pt aware to call PCP or go to the Emergency Dept.? Yes  Was the patient provided with contact information for the PCP's office or ED? Yes  Was to pt encouraged to call back with questions or concerns?yes.

## 2020-11-20 MED ORDER — TRIAMTERENE-HCTZ 37.5-25 MG PO TABS: 1.0000 | ORAL_TABLET | Freq: Every day | ORAL | 1 refills | Status: DC

## 2020-11-20 MED ORDER — OMEPRAZOLE 20 MG PO CPDR: 20.0000 mg | DELAYED_RELEASE_CAPSULE | Freq: Every day | ORAL | 1 refills | Status: DC

## 2020-11-20 MED ORDER — ALBUTEROL SULFATE HFA 108 (90 BASE) MCG/ACT IN AERS: 2.0000 | INHALATION_SPRAY | Freq: Four times a day (QID) | RESPIRATORY_TRACT | 3 refills | Status: DC | PRN

## 2020-12-03 ENCOUNTER — Other Ambulatory Visit (INDEPENDENT_AMBULATORY_CARE_PROVIDER_SITE_OTHER): Payer: Self-pay

## 2020-12-03 DIAGNOSIS — J3089 Other allergic rhinitis: Secondary | ICD-10-CM

## 2020-12-03 DIAGNOSIS — I10 Essential (primary) hypertension: Secondary | ICD-10-CM

## 2020-12-03 DIAGNOSIS — J4551 Severe persistent asthma with (acute) exacerbation: Secondary | ICD-10-CM

## 2020-12-04 MED ORDER — TRIAMTERENE-HCTZ 37.5-25 MG PO TABS: 1.0000 | ORAL_TABLET | Freq: Every day | ORAL | 1 refills | Status: DC

## 2020-12-04 MED ORDER — LEVOCETIRIZINE DIHYDROCHLORIDE 5 MG PO TABS: 5.0000 mg | ORAL_TABLET | Freq: Every day | ORAL | 1 refills | Status: DC

## 2020-12-04 MED ORDER — DICLOFENAC SODIUM 1 % EX GEL: 2.0000 g | Freq: Four times a day (QID) | CUTANEOUS | 0 refills | Status: DC | PRN

## 2020-12-04 MED ORDER — AMLODIPINE BESYLATE 10 MG PO TABS: 10.0000 mg | ORAL_TABLET | Freq: Every day | ORAL | 1 refills | Status: DC

## 2020-12-11 ENCOUNTER — Ambulatory Visit (INDEPENDENT_AMBULATORY_CARE_PROVIDER_SITE_OTHER): Payer: Medicare HMO | Admitting: Nurse Practitioner

## 2020-12-11 ENCOUNTER — Encounter (INDEPENDENT_AMBULATORY_CARE_PROVIDER_SITE_OTHER): Payer: Self-pay | Admitting: Nurse Practitioner

## 2021-01-02 ENCOUNTER — Other Ambulatory Visit: Payer: Self-pay

## 2021-01-02 ENCOUNTER — Encounter (INDEPENDENT_AMBULATORY_CARE_PROVIDER_SITE_OTHER): Payer: Self-pay | Admitting: Nurse Practitioner

## 2021-01-02 ENCOUNTER — Ambulatory Visit (INDEPENDENT_AMBULATORY_CARE_PROVIDER_SITE_OTHER): Payer: Medicare HMO | Admitting: Nurse Practitioner

## 2021-01-02 VITALS — BP 138/100 | HR 84 | Temp 97.1°F | Ht 61.75 in | Wt 295.4 lb

## 2021-01-02 DIAGNOSIS — R7303 Prediabetes: Secondary | ICD-10-CM

## 2021-01-02 DIAGNOSIS — I10 Essential (primary) hypertension: Secondary | ICD-10-CM

## 2021-01-02 DIAGNOSIS — M791 Myalgia, unspecified site: Secondary | ICD-10-CM | POA: Diagnosis not present

## 2021-01-02 DIAGNOSIS — Z6841 Body Mass Index (BMI) 40.0 and over, adult: Secondary | ICD-10-CM

## 2021-01-02 DIAGNOSIS — R0789 Other chest pain: Secondary | ICD-10-CM | POA: Diagnosis not present

## 2021-01-02 DIAGNOSIS — E785 Hyperlipidemia, unspecified: Secondary | ICD-10-CM

## 2021-01-02 DIAGNOSIS — E662 Morbid (severe) obesity with alveolar hypoventilation: Secondary | ICD-10-CM | POA: Insufficient documentation
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
## 2021-01-02 NOTE — Patient Instructions (Signed)
[facility] Dietary Recommendations for Weight Loss What to Avoid Avoid added sugars Often added sugar can be found in processed foods such as many condiments, dry cereals, cakes, cookies, chips, crisps, crackers, candies, sweetened drinks, etc.  Read labels and AVOID/DECREASE use of foods with the following in their ingredient list: Sugar, fructose, high fructose corn syrup, sucrose, glucose, maltose, dextrose, molasses, cane sugar, brown sugar, any type of syrup, agave nectar, etc.   Avoid snacking in between meals Avoid foods made with flour If you are going to eat food made with flour, choose those made with whole-grains; and, minimize your consumption as much as is tolerable Avoid processed foods These foods are generally stocked in the middle of the grocery store. Focus on shopping on the perimeter of the grocery.  Avoid Meat  We recommend following a plant-based diet at [facility]. Thus, we recommend avoiding meat as a general rule. Consider eating beans, legumes, eggs, and/or dairy products for regular protein sources If you plan on eating meat limit to 4 ounces of meat at a time and choose lean options such as Fish, chicken, [place]. Avoid red meat intake such as pork and/or steak What to Include Vegetables GREEN LEAFY VEGETABLES: Kale, spinach, mustard greens, collard greens, cabbage, broccoli, etc. OTHER: Asparagus, cauliflower, eggplant, carrots, peas, Brussel sprouts, tomatoes, bell peppers, zucchini, beets, cucumbers, etc. Grains, seeds, and legumes Beans: kidney beans, black eyed peas, garbanzo beans, black beans, pinto beans, etc. Whole, unrefined grains: brown rice, barley, bulgur, oatmeal, etc. Healthy fats  Avoid highly processed fats such as vegetable oil Examples of healthy fats: avocado, olives, virgin olive oil, dark chocolate (?72% Cocoa), nuts (peanuts, almonds, walnuts, cashews, pecans, etc.) None to Low Intake of Animal Sources of Protein Meat  sources: chicken, [place], salmon, tuna. Limit to 4 ounces of meat at one time. Consider limiting dairy sources, but when choosing dairy focus on: PLAIN [place] yogurt, cottage cheese, high-protein milk Fruit Choose berries  When to Eat Intermittent Fasting: Choosing not to eat for a specific time period, but DO FOCUS ON HYDRATION when fasting Multiple Techniques: Time Restricted Eating: eat 3 meals in a day, each meal lasting no more than 60 minutes, no snacks between meals 16-18 hour fast: fast for 16 to 18 hours up to 7 days a week. Often suggested to start with 2-3 nonconsecutive days per week.  Remember the time you sleep is counted as fasting.  Examples of eating schedule: Fast from 7:00pm-11:00am. Eat between 11:00am-7:00pm.  24-hour fast: fast for 24 hours up to every other day. Often suggested to start with 1 day per week Remember the time you sleep is counted as fasting Examples of eating schedule:  Eating day: eat 2-3 meals on your eating day. If doing 2 meals, each meal should last no more than 90 minutes. If doing 3 meals, each meal should last no more than 60 minutes. Finish last meal by 7:00pm. Fasting day: Fast until 7:00pm.  IF YOU FEEL UNWELL FOR ANY REASON/IN ANY WAY WHEN FASTING, STOP FASTING BY EATING A NUTRITIOUS SNACK OR LIGHT MEAL ALWAYS FOCUS ON HYDRATION DURING FASTS Acceptable Hydration sources: water, broths, tea/coffee (black tea/coffee is best but using a small amount of whole-fat dairy products in coffee/tea is acceptable).  Poor Hydration Sources: anything with sugar or artificial sweeteners added to it  These recommendations have been developed for patients that are actively receiving medical care from either Dr. Anastasio Champion or Jeralyn Ruths, DNP, NP-C at [facility]. These recommendations  are developed for patients with specific medical conditions and are not meant to be distributed or used by others that are not actively receiving care from either provider  listed above at [facility]. It is not appropriate to participate in the above eating plans without proper medical supervision.   Reference: Rexanne Mano. The obesity code. [place]; 2016.

## 2021-01-15 ENCOUNTER — Telehealth (INDEPENDENT_AMBULATORY_CARE_PROVIDER_SITE_OTHER): Payer: Self-pay | Admitting: Nurse Practitioner

## 2021-01-15 DIAGNOSIS — J3089 Other allergic rhinitis: Secondary | ICD-10-CM

## 2021-01-15 DIAGNOSIS — J4551 Severe persistent asthma with (acute) exacerbation: Secondary | ICD-10-CM

## 2021-01-15 MED ORDER — BUDESONIDE-FORMOTEROL FUMARATE 160-4.5 MCG/ACT IN AERO: 2.0000 | INHALATION_SPRAY | Freq: Two times a day (BID) | RESPIRATORY_TRACT | 3 refills | Status: AC

## 2021-01-15 NOTE — Telephone Encounter (Signed)
Called patient and not able to leave a voice message due to full mailbox.

## 2021-01-15 NOTE — Telephone Encounter (Signed)
Ok, symbicort approved and sent to [facility] in [place].

## 2021-01-15 NOTE — Telephone Encounter (Signed)
Called patient and asked her about Symbicort and patient stated that she has never had any trouble taking this medication and states this works well for her. Patient stated OK to send for refill.

## 2021-01-15 NOTE — Addendum Note (Signed)
Addended by: Ailene Ards on: 01/15/2021 04:43 PM   Modules accepted: Orders

## 2021-01-15 NOTE — Telephone Encounter (Signed)
Please call this patient and verify that she has been on symbicort and has tolerated it. I received an alert that because she has an allergy to prenisone she may have an allergy to symbicort. However, I thought she was on this medication and had been tolerating it well. As long as she has been tolerating it then I will authorize the prescription. Just wanted to verify.

## 2021-02-16 ENCOUNTER — Other Ambulatory Visit (INDEPENDENT_AMBULATORY_CARE_PROVIDER_SITE_OTHER): Payer: Self-pay | Admitting: Nurse Practitioner

## 2021-03-03 ENCOUNTER — Ambulatory Visit (INDEPENDENT_AMBULATORY_CARE_PROVIDER_SITE_OTHER): Payer: Medicare HMO | Admitting: Internal Medicine

## 2021-09-08 ENCOUNTER — Other Ambulatory Visit (INDEPENDENT_AMBULATORY_CARE_PROVIDER_SITE_OTHER): Payer: Self-pay | Admitting: Nurse Practitioner

## 2021-09-09 ENCOUNTER — Other Ambulatory Visit: Payer: Self-pay | Admitting: Family Medicine

## 2021-09-09 DIAGNOSIS — Z1231 Encounter for screening mammogram for malignant neoplasm of breast: Secondary | ICD-10-CM

## 2021-10-29 ENCOUNTER — Ambulatory Visit
Admission: RE | Admit: 2021-10-29 | Discharge: 2021-10-29 | Disposition: A | Discharge status: Home / Self Care | Payer: Medicare HMO | Source: Ambulatory Visit | Attending: Family Medicine | Admitting: Family Medicine

## 2021-10-29 DIAGNOSIS — Z1231 Encounter for screening mammogram for malignant neoplasm of breast: Secondary | ICD-10-CM

## 2021-12-17 DIAGNOSIS — H6981 Other specified disorders of Eustachian tube, right ear: Secondary | ICD-10-CM | POA: Diagnosis not present

## 2021-12-17 DIAGNOSIS — J320 Chronic maxillary sinusitis: Secondary | ICD-10-CM | POA: Diagnosis not present

## 2021-12-17 DIAGNOSIS — J339 Nasal polyp, unspecified: Secondary | ICD-10-CM | POA: Diagnosis not present

## 2022-01-14 DIAGNOSIS — R635 Abnormal weight gain: Secondary | ICD-10-CM | POA: Diagnosis not present

## 2022-01-14 DIAGNOSIS — Z6841 Body Mass Index (BMI) 40.0 and over, adult: Secondary | ICD-10-CM | POA: Diagnosis not present

## 2022-02-13 DIAGNOSIS — Z6841 Body Mass Index (BMI) 40.0 and over, adult: Secondary | ICD-10-CM | POA: Diagnosis not present

## 2022-02-13 DIAGNOSIS — J45909 Unspecified asthma, uncomplicated: Secondary | ICD-10-CM | POA: Diagnosis not present

## 2022-02-13 DIAGNOSIS — I10 Essential (primary) hypertension: Secondary | ICD-10-CM | POA: Diagnosis not present

## 2022-02-13 DIAGNOSIS — R635 Abnormal weight gain: Secondary | ICD-10-CM | POA: Diagnosis not present

## 2022-02-19 DIAGNOSIS — H5213 Myopia, bilateral: Secondary | ICD-10-CM | POA: Diagnosis not present

## 2022-02-19 DIAGNOSIS — H5203 Hypermetropia, bilateral: Secondary | ICD-10-CM | POA: Diagnosis not present

## 2022-03-13 DIAGNOSIS — Z13220 Encounter for screening for lipoid disorders: Secondary | ICD-10-CM | POA: Diagnosis not present

## 2022-03-13 DIAGNOSIS — Z131 Encounter for screening for diabetes mellitus: Secondary | ICD-10-CM | POA: Diagnosis not present

## 2022-03-13 DIAGNOSIS — E538 Deficiency of other specified B group vitamins: Secondary | ICD-10-CM | POA: Diagnosis not present

## 2022-03-13 DIAGNOSIS — J45909 Unspecified asthma, uncomplicated: Secondary | ICD-10-CM | POA: Diagnosis not present

## 2022-03-13 DIAGNOSIS — E559 Vitamin D deficiency, unspecified: Secondary | ICD-10-CM | POA: Diagnosis not present

## 2022-03-13 DIAGNOSIS — I10 Essential (primary) hypertension: Secondary | ICD-10-CM | POA: Diagnosis not present

## 2022-03-13 DIAGNOSIS — Z6841 Body Mass Index (BMI) 40.0 and over, adult: Secondary | ICD-10-CM | POA: Diagnosis not present

## 2022-03-13 DIAGNOSIS — R635 Abnormal weight gain: Secondary | ICD-10-CM | POA: Diagnosis not present

## 2022-09-11 ENCOUNTER — Ambulatory Visit: Payer: Medicare HMO | Admitting: Obstetrics and Gynecology

## 2022-09-14 ENCOUNTER — Ambulatory Visit: Payer: Medicare HMO | Admitting: Obstetrics & Gynecology

## 2022-11-02 ENCOUNTER — Encounter (HOSPITAL_COMMUNITY): Payer: Self-pay

## 2022-11-02 ENCOUNTER — Emergency Department (HOSPITAL_COMMUNITY): Payer: Medicare HMO

## 2022-11-02 ENCOUNTER — Inpatient Hospital Stay (HOSPITAL_COMMUNITY)
Admission: EM | Admit: 2022-11-02 | Discharge: 2022-11-05 | DRG: 202 | Disposition: A | Discharge status: Home / Self Care | Payer: Medicare HMO | Attending: Internal Medicine | Admitting: Internal Medicine

## 2022-11-02 ENCOUNTER — Other Ambulatory Visit: Payer: Self-pay

## 2022-11-02 DIAGNOSIS — Z8249 Family history of ischemic heart disease and other diseases of the circulatory system: Secondary | ICD-10-CM

## 2022-11-02 DIAGNOSIS — I10 Essential (primary) hypertension: Secondary | ICD-10-CM | POA: Diagnosis not present

## 2022-11-02 DIAGNOSIS — J45901 Unspecified asthma with (acute) exacerbation: Secondary | ICD-10-CM | POA: Diagnosis not present

## 2022-11-02 DIAGNOSIS — T380X5A Adverse effect of glucocorticoids and synthetic analogues, initial encounter: Secondary | ICD-10-CM | POA: Diagnosis not present

## 2022-11-02 DIAGNOSIS — T50996A Underdosing of other drugs, medicaments and biological substances, initial encounter: Secondary | ICD-10-CM | POA: Diagnosis present

## 2022-11-02 DIAGNOSIS — Z6841 Body Mass Index (BMI) 40.0 and over, adult: Secondary | ICD-10-CM

## 2022-11-02 DIAGNOSIS — E662 Morbid (severe) obesity with alveolar hypoventilation: Secondary | ICD-10-CM

## 2022-11-02 DIAGNOSIS — J4551 Severe persistent asthma with (acute) exacerbation: Secondary | ICD-10-CM | POA: Diagnosis not present

## 2022-11-02 DIAGNOSIS — K219 Gastro-esophageal reflux disease without esophagitis: Secondary | ICD-10-CM

## 2022-11-02 DIAGNOSIS — I119 Hypertensive heart disease without heart failure: Secondary | ICD-10-CM | POA: Diagnosis present

## 2022-11-02 DIAGNOSIS — Z888 Allergy status to other drugs, medicaments and biological substances status: Secondary | ICD-10-CM

## 2022-11-02 DIAGNOSIS — R000 Tachycardia, unspecified: Secondary | ICD-10-CM

## 2022-11-02 DIAGNOSIS — Z9049 Acquired absence of other specified parts of digestive tract: Secondary | ICD-10-CM

## 2022-11-02 DIAGNOSIS — Z91030 Bee allergy status: Secondary | ICD-10-CM

## 2022-11-02 DIAGNOSIS — J302 Other seasonal allergic rhinitis: Secondary | ICD-10-CM | POA: Diagnosis present

## 2022-11-02 DIAGNOSIS — D72829 Elevated white blood cell count, unspecified: Secondary | ICD-10-CM | POA: Diagnosis not present

## 2022-11-02 DIAGNOSIS — R739 Hyperglycemia, unspecified: Secondary | ICD-10-CM

## 2022-11-02 DIAGNOSIS — Z90710 Acquired absence of both cervix and uterus: Secondary | ICD-10-CM

## 2022-11-02 DIAGNOSIS — Z79899 Other long term (current) drug therapy: Secondary | ICD-10-CM

## 2022-11-02 DIAGNOSIS — Z713 Dietary counseling and surveillance: Secondary | ICD-10-CM

## 2022-11-02 DIAGNOSIS — J3089 Other allergic rhinitis: Secondary | ICD-10-CM

## 2022-11-02 DIAGNOSIS — Z91120 Patient's intentional underdosing of medication regimen due to financial hardship: Secondary | ICD-10-CM

## 2022-11-02 DIAGNOSIS — Z9079 Acquired absence of other genital organ(s): Secondary | ICD-10-CM

## 2022-11-02 DIAGNOSIS — Z7951 Long term (current) use of inhaled steroids: Secondary | ICD-10-CM

## 2022-11-02 DIAGNOSIS — Z90722 Acquired absence of ovaries, bilateral: Secondary | ICD-10-CM

## 2022-11-02 LAB — BASIC METABOLIC PANEL
Anion gap: 8 (ref 5–15)
BUN: 9 mg/dL (ref 6–20)
CO2: 24 mmol/L (ref 22–32)
Calcium: 9 mg/dL (ref 8.9–10.3)
Chloride: 103 mmol/L (ref 98–111)
Creatinine, Ser: 0.8 mg/dL (ref 0.44–1.00)
GFR, Estimated: >60 mL/min (ref >60)
Glucose, Bld: 103 mg/dL — ABNORMAL HIGH (ref 70–99)
Potassium: 3.6 mmol/L (ref 3.5–5.1)
Sodium: 135 mmol/L (ref 135–145)

## 2022-11-02 LAB — CBC WITH DIFFERENTIAL/PLATELET
Abs Immature Granulocytes: 0.05 10*3/uL (ref 0.00–0.07)
Basophils Absolute: 0 10*3/uL (ref 0.0–0.1)
Basophils Relative: 0 %
Eosinophils Absolute: 0.5 10*3/uL (ref 0.0–0.5)
Eosinophils Relative: 4 %
HCT: 42.4 % (ref 36.0–46.0)
Hemoglobin: 12.9 g/dL (ref 12.0–15.0)
Immature Granulocytes: 0 %
Lymphocytes Relative: 11 %
Lymphs Abs: 1.4 10*3/uL (ref 0.7–4.0)
MCH: 22.6 pg — ABNORMAL LOW (ref 26.0–34.0)
MCHC: 30.4 g/dL (ref 30.0–36.0)
MCV: 74.4 fL — ABNORMAL LOW (ref 80.0–100.0)
Monocytes Absolute: 0.6 10*3/uL (ref 0.1–1.0)
Monocytes Relative: 5 %
Neutro Abs: 9.8 10*3/uL — ABNORMAL HIGH (ref 1.7–7.7)
Neutrophils Relative %: 80 %
Platelets: 364 10*3/uL (ref 150–400)
RBC: 5.7 MIL/uL — ABNORMAL HIGH (ref 3.87–5.11)
RDW: 16.1 % — ABNORMAL HIGH (ref 11.5–15.5)
WBC: 12.3 10*3/uL — ABNORMAL HIGH (ref 4.0–10.5)
nRBC: 0 % (ref 0.0–0.2)

## 2022-11-02 LAB — BRAIN NATRIURETIC PEPTIDE: B Natriuretic Peptide: 22.8 pg/mL (ref 0.0–100.0)

## 2022-11-02 MED ORDER — AMLODIPINE BESYLATE 5 MG PO TABS: 10.0000 mg | ORAL_TABLET | Freq: Every day | ORAL | Status: DC

## 2022-11-02 MED ORDER — ENOXAPARIN SODIUM 40 MG/0.4ML IJ SOSY
40.0000 mg | PREFILLED_SYRINGE | INTRAMUSCULAR | Status: DC
  Administered 2022-11-02 – 2022-11-04 (×3): 40 mg via SUBCUTANEOUS
  Filled 2022-11-02 (×4): qty 0.4

## 2022-11-02 MED ORDER — HYDRALAZINE HCL 20 MG/ML IJ SOLN
10.0000 mg | Freq: Three times a day (TID) | INTRAMUSCULAR | Status: DC | PRN
  Filled 2022-11-02: qty 1

## 2022-11-02 MED ORDER — CLONIDINE HCL 0.1 MG PO TABS
0.2000 mg | ORAL_TABLET | Freq: Once | ORAL | Status: AC
  Administered 2022-11-02: 0.2 mg via ORAL
  Filled 2022-11-02: qty 2

## 2022-11-02 MED ORDER — MAGNESIUM SULFATE 2 GM/50ML IV SOLN
2.0000 g | Freq: Once | INTRAVENOUS | Status: AC
  Administered 2022-11-02: 2 g via INTRAVENOUS
  Filled 2022-11-02: qty 50

## 2022-11-02 MED ORDER — ACETAMINOPHEN 650 MG RE SUPP: 650.0000 mg | Freq: Four times a day (QID) | RECTAL | Status: DC | PRN

## 2022-11-02 MED ORDER — METHYLPREDNISOLONE SODIUM SUCC 125 MG IJ SOLR
125.0000 mg | Freq: Once | INTRAMUSCULAR | Status: AC
  Administered 2022-11-02: 125 mg via INTRAVENOUS
  Filled 2022-11-02: qty 2

## 2022-11-02 MED ORDER — LOSARTAN POTASSIUM 50 MG PO TABS
25.0000 mg | ORAL_TABLET | Freq: Every day | ORAL | Status: DC
  Administered 2022-11-02 – 2022-11-05 (×4): 25 mg via ORAL
  Filled 2022-11-02 (×4): qty 1

## 2022-11-02 MED ORDER — IPRATROPIUM-ALBUTEROL 0.5-2.5 (3) MG/3ML IN SOLN: 3.0000 mL | RESPIRATORY_TRACT | Status: DC | PRN

## 2022-11-02 MED ORDER — MONTELUKAST SODIUM 10 MG PO TABS
10.0000 mg | ORAL_TABLET | Freq: Every day | ORAL | Status: DC
  Administered 2022-11-02 – 2022-11-04 (×3): 10 mg via ORAL
  Filled 2022-11-02 (×4): qty 1

## 2022-11-02 MED ORDER — IPRATROPIUM-ALBUTEROL 0.5-2.5 (3) MG/3ML IN SOLN
9.0000 mL | Freq: Once | RESPIRATORY_TRACT | Status: AC
  Administered 2022-11-02: 9 mL via RESPIRATORY_TRACT
  Filled 2022-11-02: qty 9

## 2022-11-02 MED ORDER — ACETAMINOPHEN 325 MG PO TABS: 650.0000 mg | ORAL_TABLET | Freq: Four times a day (QID) | ORAL | Status: DC | PRN

## 2022-11-02 MED ORDER — LEVOCETIRIZINE DIHYDROCHLORIDE 5 MG PO TABS: 5.0000 mg | ORAL_TABLET | Freq: Every day | ORAL | Status: DC

## 2022-11-02 MED ORDER — METHYLPREDNISOLONE SODIUM SUCC 40 MG IJ SOLR
40.0000 mg | Freq: Two times a day (BID) | INTRAMUSCULAR | Status: DC
  Administered 2022-11-03 – 2022-11-05 (×5): 40 mg via INTRAVENOUS
  Filled 2022-11-02 (×5): qty 1

## 2022-11-02 MED ORDER — PANTOPRAZOLE SODIUM 40 MG PO TBEC
40.0000 mg | DELAYED_RELEASE_TABLET | Freq: Every day | ORAL | Status: DC
  Administered 2022-11-02 – 2022-11-05 (×4): 40 mg via ORAL
  Filled 2022-11-02 (×4): qty 1

## 2022-11-02 MED ORDER — TRIAMTERENE-HCTZ 37.5-25 MG PO TABS
1.0000 | ORAL_TABLET | Freq: Every day | ORAL | Status: DC
  Administered 2022-11-02 – 2022-11-05 (×4): 1 via ORAL
  Filled 2022-11-02 (×5): qty 1

## 2022-11-02 MED ORDER — ALBUTEROL SULFATE (2.5 MG/3ML) 0.083% IN NEBU
10.0000 mg/h | INHALATION_SOLUTION | RESPIRATORY_TRACT | Status: AC
  Administered 2022-11-02: 10 mg/h via RESPIRATORY_TRACT
  Filled 2022-11-02: qty 12
  Filled 2022-11-02: qty 3

## 2022-11-02 MED ORDER — ONDANSETRON HCL 4 MG PO TABS: 4.0000 mg | ORAL_TABLET | Freq: Four times a day (QID) | ORAL | Status: DC | PRN

## 2022-11-02 MED ORDER — ONDANSETRON HCL 4 MG/2ML IJ SOLN: 4.0000 mg | Freq: Four times a day (QID) | INTRAMUSCULAR | Status: DC | PRN

## 2022-11-02 MED ORDER — LORATADINE 10 MG PO TABS
10.0000 mg | ORAL_TABLET | Freq: Every day | ORAL | Status: DC
  Administered 2022-11-03 – 2022-11-05 (×3): 10 mg via ORAL
  Filled 2022-11-02 (×3): qty 1

## 2022-11-02 MED ORDER — BUDESONIDE 0.5 MG/2ML IN SUSP
0.5000 mg | Freq: Two times a day (BID) | RESPIRATORY_TRACT | Status: DC
  Administered 2022-11-02 – 2022-11-05 (×6): 0.5 mg via RESPIRATORY_TRACT
  Filled 2022-11-02 (×6): qty 2

## 2022-11-02 NOTE — Assessment & Plan Note (Signed)
-  Body mass index is 42.07 kg/m. -Low-calorie diet, portion control and increase physical activity discussed with patient.

## 2022-11-02 NOTE — ED Notes (Signed)
Pt. Refused to do the Covid test

## 2022-11-02 NOTE — Assessment & Plan Note (Signed)
-  Patient with history of asthma; expressing having insurance issues covering her chronic Xolair injection which had kept her well-controlled.  Finally insurance approval to receive medication restarted on Friday; but unfortunately has been experiencing ongoing exacerbation not treated by home nebulizer management. -Continue steroids, nebulizer bronchodilators, magnesium and Singulair -Oxygen supplementation will be provided and weaned off as tolerated. -follow clinical response.

## 2022-11-02 NOTE — Assessment & Plan Note (Signed)
-   Continue the use of PPI. °

## 2022-11-02 NOTE — Assessment & Plan Note (Signed)
-  Resume home antihypertensive agents -Follow-up vital signs and provide as needed hydralazine. -Heart healthy/low-sodium diet discussed with patient.

## 2022-11-02 NOTE — ED Triage Notes (Signed)
Patient has had an asthma flare up for 2 weeks. Feeling short of breath at rest. Has not used any inhaler.

## 2022-11-02 NOTE — ED Provider Notes (Signed)
  Physical Exam  BP (!) 168/98   Pulse (!) 106   Temp 98.6 F (37 C) (Oral)   Resp (!) 22   Ht  (1.575 m)   Wt 104.3 kg   LMP 12/19/2017   SpO2 97%   BMI 42.07 kg/m   Physical Exam Vitals and nursing note reviewed.  Constitutional:      General: She is not in acute distress.    Appearance: She is well-developed.  HENT:     Head: Normocephalic and atraumatic.  Eyes:     Conjunctiva/sclera: Conjunctivae normal.  Cardiovascular:     Rate and Rhythm: Normal rate and regular rhythm.     Heart sounds: No murmur heard. Pulmonary:     Effort: Pulmonary effort is normal. No respiratory distress.     Breath sounds: Wheezing present.  Abdominal:     Palpations: Abdomen is soft.     Tenderness: There is no abdominal tenderness.  Musculoskeletal:        General: No swelling.     Cervical back: Neck supple.  Skin:    General: Skin is warm and dry.     Capillary Refill: Capillary refill takes less than 2 seconds.  Neurological:     Mental Status: She is alert.  Psychiatric:        Mood and Affect: Mood normal.     Procedures  .Critical Care  Performed by: Glendora Score, MD Authorized by: Glendora Score, MD   Critical care provider statement:    Critical care time (minutes):  30   Critical care was necessary to treat or prevent imminent or life-threatening deterioration of the following conditions:  Respiratory failure   Critical care was time spent personally by me on the following activities:  Development of treatment plan with patient or surrogate, discussions with consultants, evaluation of patient's response to treatment, examination of patient, ordering and review of laboratory studies, ordering and review of radiographic studies, ordering and performing treatments and interventions, pulse oximetry, re-evaluation of patient's condition and review of old charts   ED Course / MDM   Clinical Course as of 11/02/22 2029  Mon Nov 02, 2022  1707 No oral pred [MK]     Clinical Course User Index [MK] Glendora Score, MD   Medical Decision Making Amount and/or Complexity of Data Reviewed Labs: ordered. Radiology: ordered.  Risk Prescription drug management. Decision regarding hospitalization.   Patient received in handoff.  Severe asthma exacerbation on Dupixent but has been unable to get this medicine due to insurance issues.  Pending reevaluation after continuous albuterol therapy.  Patient still wheezing after continuous albuterol therapy and 3 DuoNebs given.  On second reevaluation, patient persistently wheezy and will require hospital admission.  Patient then admitted.       Glendora Score, MD 11/02/22 2029

## 2022-11-02 NOTE — H&P (Signed)
History and Physical    Patient: [name] UJW:[id number] DOB: May 14, 1976 DOA: 11/02/2022 DOS: the patient was seen and examined on 11/02/2022 PCP: Diamantina Providence, FNP  Patient coming from: Home  Chief Complaint:  Chief Complaint  Patient presents with   Asthma   HPI: [name] is a 47 y.o. female with medical history significant of class III obesity, gastroesophageal reflux disease, asthma, hypertension and obstructive sleep apnea; presented to the hospital secondary to shortness of breath and increased wheezing.  Symptom has been present for the last 48 hours and worsening.  Patient reports no fever, no chills, no chest pain and no nausea or vomiting.  Also expressing no focal weaknesses, dysuria, hematuria, melena or hematochezia.  Patient reported symptoms are typical for her asthma exacerbation which is something that she has been having trouble since her insurance company was rejecting to pay for her Dupilumab injections.   CXR neg for acute infiltrates, but demonstrating vascular congestion and cardiomegaly. Patient with diffuse exp wheezing and difficulty speaking in full sentences.   Tx with steroids, magnesium and bronchodilators provided with some relief, TRH contacted to place patient in the hospital fo further evaluation and management.  Review of Systems: As mentioned in the history of present illness. All other systems reviewed and are negative. Past Medical History:  Diagnosis Date   Anemia    Asthma    Blood transfusion without reported diagnosis    Environmental allergies    Fibroid, uterine    Fibroids    GERD (gastroesophageal reflux disease)    Hypertension    no meds   Menorrhagia    Seasonal allergies    chronic   Shortness of breath    Sleep apnea    does not use CPAP   Past Surgical History:  Procedure Laterality Date   ABDOMINAL HYSTERECTOMY Bilateral 02/08/2018   Procedure: HYSTERECTOMY ABDOMINAL WITH LEFT SALPINGO-OOPORECTOMY AND RIGHT  SALPINGECTOMY;  Surgeon: Willodean Rosenthal, MD;  Location: [facility];  Service: Gynecology;  Laterality: Bilateral;   CHOLECYSTECTOMY     06/29/2011   CHOLECYSTECTOMY  06/29/2011   Procedure: LAPAROSCOPIC CHOLECYSTECTOMY WITH INTRAOPERATIVE CHOLANGIOGRAM;  Surgeon: Robyne Askew, MD;  Location: [facility] OR;  Service: General;  Laterality: N/A;   ECTOPIC PREGNANCY SURGERY     Laparoscopic   ERCP  06/30/2011   Procedure: ENDOSCOPIC RETROGRADE CHOLANGIOPANCREATOGRAPHY (ERCP);  Surgeon: Petra Kuba, MD;  Location: [facility] ENDOSCOPY;  Service: Endoscopy;  Laterality: N/A;  probable sphincterotomy   ERCP  07/03/2011   Procedure: ENDOSCOPIC RETROGRADE CHOLANGIOPANCREATOGRAPHY (ERCP);  Surgeon: Freddy Jaksch, MD;  Location: [facility] OR;  Service: Endoscopy;  Laterality: N/A;  PROPOFOL   MYOMECTOMY  2014   MYOMECTOMY N/A 11/14/2012   Procedure: MYOMECTOMY;  Surgeon: Tereso Newcomer, MD;  Location: [facility] OR[facility];  Service: Gynecology;  Laterality: N/A;   NASAL POLYP EXCISION  2016   in [place]   WISDOM TOOTH EXTRACTION     Social History:  reports that she has never smoked. She has never used smokeless tobacco. She reports current alcohol use. She reports that she does not use drugs.  Allergies  Allergen Reactions   Bee Venom Anaphylaxis    Patient has an epi pen, also has not been stung before but was noted to be allergic due to allergy testing    Prednisone Hives and Swelling    Patient has been rechallenged with prednisone and experienced the same symptoms of hives and face swelling.  She can tolerate methylprednisolone IV  and PO.    Family History  Problem Relation Age of Onset   Hypertension Mother    Diabetes Mother    Hypertension Father    Hypertension Brother    Anesthesia problems Neg Hx    Hypotension Neg Hx    Malignant hyperthermia Neg Hx    Pseudochol deficiency Neg Hx     Prior to Admission medications   Medication Sig Start Date End Date Taking? Authorizing Provider  albuterol  (PROVENTIL) (2.5 MG/3ML) 0.083% nebulizer solution Take 2.5 mg by nebulization every 6 (six) hours as needed for wheezing or shortness of breath.    [provider]  albuterol (VENTOLIN HFA) 108 (90 Base) MCG/ACT inhaler Inhale 2 puffs into the lungs every 6 (six) hours as needed for wheezing or shortness of breath. 11/20/20   Elenore Paddy, NP  amLODipine (NORVASC) 10 MG tablet Take 1 tablet (10 mg total) by mouth daily. 12/04/20   Wilson Singer, MD  budesonide (PULMICORT) 0.25 MG/2ML nebulizer solution Budesonide rinses twice daily 01/20/18   Alfonse Spruce, MD  budesonide-formoterol [facility]) 160-4.5 MCG/ACT inhaler Inhale 2 puffs into the lungs 2 (two) times daily. 01/15/21   Elenore Paddy, NP  Chlorphen-PE-Acetaminophen 4-10-325 MG TABS Take 1 tablet by mouth 2 (two) times daily. 11/06/20   Elenore Paddy, NP  diclofenac Sodium (VOLTAREN) 1 % GEL Apply 2 g topically 4 (four) times daily as needed. 12/04/20   Lilly Cove C, MD  EPINEPHrine 0.3 mg/0.3 mL IJ SOAJ injection Inject 0.3 mg into the muscle as needed for anaphylaxis. 11/12/20   Wilson Singer, MD  ibuprofen (ADVIL) 600 MG tablet Take 1 tablet (600 mg total) by mouth every 6 (six) hours as needed. 11/16/20   Long, Arlyss Repress, MD  ipratropium-albuterol (DUONEB) 0.5-2.5 (3) MG/3ML SOLN Take 3 mLs every 6 (six) hours as needed by nebulization. 05/31/17   Alfonse Spruce, MD  levocetirizine (XYZAL) 5 MG tablet Take 1 tablet (5 mg total) by mouth daily. 12/04/20   Wilson Singer, MD  omeprazole (PRILOSEC) 20 MG capsule TAKE 1 CAPSULE EVERY DAY 02/17/21   Elenore Paddy, NP  triamterene-hydrochlorothiazide (MAXZIDE-25) 37.5-25 MG tablet Take 1 tablet by mouth daily. 12/04/20   Wilson Singer, MD    Physical Exam: Vitals:   11/02/22 1533 11/02/22 1607 11/02/22 1752 11/02/22 1855  BP: (!) 175/116  (!) 168/98   Pulse: (!) 106     Resp: (!) 22     Temp: 98.6 F (37 C)     TempSrc: Oral     SpO2: 94% 99%  97%  Weight:       Height:       General exam: Alert, awake, oriented x 3; demonstrating difficulty speaking in full sentences and having tachypnea with minimal exertion.  No chest pain. Respiratory system: Fair air movement bilaterally, no using accessory muscles; positive expiratory wheezing.  Examination limited secondary to body habitus. Cardiovascular system:RRR. No rubs, gallops or murmurs; unable to assess JVD with body habitus. Gastrointestinal system: Abdomen is obese, nondistended, soft and nontender. No organomegaly or masses felt. Normal bowel sounds heard. Central nervous system: Alert and oriented. No focal neurological deficits. Extremities: No cyanosis or clubbing. Skin: No petechiae. Psychiatry: Judgement and insight appear normal. Mood & affect appropriate.   Data Reviewed: Respiratory panel by PCR: Pending CBC: WBCs 12.3, hemoglobin 12.9 platelet count 360 4K Basic metabolic panel: Sodium 135, potassium 3.6, chloride 103, bicarb 24, glucose 103, BUN 9, creatinine 0.86,  GFR more than 60. BNP: 22.8  Assessment and Plan: * Asthma exacerbation attacks -Patient with history of asthma; expressing having insurance issues covering her chronic Xolair injection which had kept her well-controlled.  Finally insurance approval to receive medication restarted on Friday; but unfortunately has been experiencing ongoing exacerbation not treated by home nebulizer management. -Continue steroids, nebulizer bronchodilators, magnesium and Singulair -Oxygen supplementation will be provided and weaned off as tolerated. -follow clinical response.  GERD (gastroesophageal reflux disease) -Continue the use of PPI.  Hyperglycemia -With prior history of prediabetes/steroid induced hyperglycemia -Update A1c -Follow CBGs fluctuation; modified carbohydrate diet discussed with patient.  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 -Body mass index is 42.07  kg/m. -Low-calorie diet, portion control and increase physical activity discussed with patient.  Essential hypertension -Resume home antihypertensive agents -Follow-up vital signs and provide as needed hydralazine. -Heart healthy/low-sodium diet discussed with patient.  Leukocytosis: -Stress demargination versus steroids -No frank infiltrates appreciated on x-ray -Patient denied fever or other complaints suggesting infection. -Continue to follow trend. -Holding on antibiotics at the moment.    Advance Care Planning:   Code Status: Full Code   Consults: none   Family Communication: no family at bedside  Severity of Illness: The appropriate patient status for this patient is OBSERVATION. Observation status is judged to be reasonable and necessary in order to provide the required intensity of service to ensure the patient's safety. The patient's presenting symptoms, physical exam findings, and initial radiographic and laboratory data in the context of their medical condition is felt to place them at decreased risk for further clinical deterioration. Furthermore, it is anticipated that the patient will be medically stable for discharge from the hospital within 2 midnights of admission.   Author: Vassie Loll, MD 11/02/2022 8:51 PM  For on call review [email or web address].

## 2022-11-02 NOTE — ED Provider Notes (Signed)
[place] EMERGENCY DEPARTMENT AT [facility] Provider Note   CSN: [id number] Arrival date & time: 11/02/22  1526     History  Chief Complaint  Patient presents with   Asthma    [name] is a 47 y.o. female with history of asthma presented to the ED with shortness of breath.  The patient reports that this is an acute on chronic issue, and that she has been having wheezing for the past several days but it abruptly worsened today.  Denies fevers or chills.  She states she does not use breathing treatments since being started on Xolair injections.  HPI     Home Medications Prior to Admission medications   Medication Sig Start Date End Date Taking? Authorizing Provider  albuterol (PROVENTIL) (2.5 MG/3ML) 0.083% nebulizer solution Take 2.5 mg by nebulization every 6 (six) hours as needed for wheezing or shortness of breath.    [provider]  albuterol (VENTOLIN HFA) 108 (90 Base) MCG/ACT inhaler Inhale 2 puffs into the lungs every 6 (six) hours as needed for wheezing or shortness of breath. 11/20/20   Elenore Paddy, NP  amLODipine (NORVASC) 10 MG tablet Take 1 tablet (10 mg total) by mouth daily. 12/04/20   Wilson Singer, MD  budesonide (PULMICORT) 0.25 MG/2ML nebulizer solution Budesonide rinses twice daily 01/20/18   Alfonse Spruce, MD  budesonide-formoterol [facility]) 160-4.5 MCG/ACT inhaler Inhale 2 puffs into the lungs 2 (two) times daily. 01/15/21   Elenore Paddy, NP  Chlorphen-PE-Acetaminophen 4-10-325 MG TABS Take 1 tablet by mouth 2 (two) times daily. 11/06/20   Elenore Paddy, NP  diclofenac Sodium (VOLTAREN) 1 % GEL Apply 2 g topically 4 (four) times daily as needed. 12/04/20   Lilly Cove C, MD  EPINEPHrine 0.3 mg/0.3 mL IJ SOAJ injection Inject 0.3 mg into the muscle as needed for anaphylaxis. 11/12/20   Wilson Singer, MD  ibuprofen (ADVIL) 600 MG tablet Take 1 tablet (600 mg total) by mouth every 6 (six) hours as needed. 11/16/20   Long,  Arlyss Repress, MD  ipratropium-albuterol (DUONEB) 0.5-2.5 (3) MG/3ML SOLN Take 3 mLs every 6 (six) hours as needed by nebulization. 05/31/17   Alfonse Spruce, MD  levocetirizine (XYZAL) 5 MG tablet Take 1 tablet (5 mg total) by mouth daily. 12/04/20   Wilson Singer, MD  omeprazole (PRILOSEC) 20 MG capsule TAKE 1 CAPSULE EVERY DAY 02/17/21   Elenore Paddy, NP  triamterene-hydrochlorothiazide (MAXZIDE-25) 37.5-25 MG tablet Take 1 tablet by mouth daily. 12/04/20   Wilson Singer, MD      Allergies    Bee venom and Prednisone    Review of Systems   Review of Systems  Physical Exam Updated Vital Signs BP (!) 175/116   Pulse (!) 106   Temp 98.6 F (37 C) (Oral)   Resp (!) 22   Ht  (1.575 m)   Wt 104.3 kg   LMP 12/19/2017   SpO2 94%   BMI 42.07 kg/m  Physical Exam Constitutional:      General: She is not in acute distress.    Appearance: She is obese.  HENT:     Head: Normocephalic and atraumatic.  Eyes:     Conjunctiva/sclera: Conjunctivae normal.     Pupils: Pupils are equal, round, and reactive to light.  Cardiovascular:     Rate and Rhythm: Normal rate and regular rhythm.  Pulmonary:     Effort: Pulmonary effort is normal. No respiratory distress.  Comments: Expiratory wheezing bilaterally Abdominal:     General: There is no distension.     Tenderness: There is no abdominal tenderness.  Skin:    General: Skin is warm and dry.  Neurological:     General: No focal deficit present.     Mental Status: She is alert. Mental status is at baseline.  Psychiatric:        Mood and Affect: Mood normal.        Behavior: Behavior normal.     ED Results / Procedures / Treatments   Labs (all labs ordered are listed, but only abnormal results are displayed) Labs Reviewed - No data to display  EKG None  Radiology No results found.  Procedures Procedures    Medications Ordered in ED Medications  albuterol (PROVENTIL,VENTOLIN) solution continuous neb (has  no administration in time range)  magnesium sulfate IVPB 2 g 50 mL (has no administration in time range)  methylPREDNISolone sodium succinate (SOLU-MEDROL) 125 mg/2 mL injection 125 mg (has no administration in time range)    ED Course/ Medical Decision Making/ A&P Clinical Course as of 11/02/22 1835  Mon Nov 02, 2022  1707 No oral pred [MK]    Clinical Course User Index [MK] Glendora Score, MD                             Medical Decision Making Amount and/or Complexity of Data Reviewed Labs: ordered. Radiology: ordered.  Risk Prescription drug management.   This patient presents to the ED with concern for shortness of breath. This involves an extensive number of treatment options, and is a complaint that carries with it a high risk of complications and morbidity.  The differential diagnosis includes asthma exacerbation versus pleural effusion versus pneumonia versus other  Co-morbidities that complicate the patient evaluation: History of asthma at high risk of exacerbation or complication  Patient is aware of having elevated blood pressure, and she reports she did not take her blood pressure medications today.  I ordered and personally interpreted labs.  The pertinent results include:  labs unremarkable.  BNP wnl.  I ordered imaging studies including dg chest I independently visualized and interpreted imaging which showed potential vascular congestion, mildly enlarged cardiac silhouette I agree with the radiologist interpretation  The patient was maintained on a cardiac monitor.  I personally viewed and interpreted the cardiac monitored which showed an underlying rhythm of: NSR and sinus tachycarida  I ordered medication including IV steroids, IV magnesium, continuous nebulizers for suspected asthma exacerbation. Clonidine for elevated BP  I have reviewed the patients home medicines and have made adjustments as needed  Test Considered: doubt acute PE clinically  Although  xray does note possible vascular congestion, I suspect this is most likely asthma exacerbation, and less likely new onset CHF.  With a normal BNP, no leg swelling, and no B-lines on bedside lung ultrasound, I suspect her wheezing is due to reactive airway disease, not pulmonary edema.   However, she will need reassessment following her breathing treatments.  The patient was signed out to Dr Wyn Forster Kommor EDP at 5 pm pending reevaluation after nebulizers and IV medications.  We did note that she does not tolerate oral steroids (prednisone) per specific allergy indications.          Final Clinical Impression(s) / ED Diagnoses Final diagnoses:  None    Rx / DC Orders ED Discharge Orders     None  Terald Sleeper, MD 11/02/22 Paulo Fruit

## 2022-11-02 NOTE — Assessment & Plan Note (Signed)
-  With prior history of prediabetes/steroid induced hyperglycemia -Update A1c -Follow CBGs fluctuation; modified carbohydrate diet discussed with patient.

## 2022-11-03 ENCOUNTER — Observation Stay (HOSPITAL_COMMUNITY): Payer: Medicare HMO

## 2022-11-03 DIAGNOSIS — T380X5A Adverse effect of glucocorticoids and synthetic analogues, initial encounter: Secondary | ICD-10-CM | POA: Diagnosis not present

## 2022-11-03 DIAGNOSIS — K219 Gastro-esophageal reflux disease without esophagitis: Secondary | ICD-10-CM | POA: Diagnosis present

## 2022-11-03 DIAGNOSIS — R739 Hyperglycemia, unspecified: Secondary | ICD-10-CM | POA: Diagnosis not present

## 2022-11-03 DIAGNOSIS — Z91120 Patient's intentional underdosing of medication regimen due to financial hardship: Secondary | ICD-10-CM | POA: Diagnosis not present

## 2022-11-03 DIAGNOSIS — E662 Morbid (severe) obesity with alveolar hypoventilation: Secondary | ICD-10-CM | POA: Diagnosis present

## 2022-11-03 DIAGNOSIS — Z888 Allergy status to other drugs, medicaments and biological substances status: Secondary | ICD-10-CM | POA: Diagnosis not present

## 2022-11-03 DIAGNOSIS — J302 Other seasonal allergic rhinitis: Secondary | ICD-10-CM | POA: Diagnosis present

## 2022-11-03 DIAGNOSIS — Z713 Dietary counseling and surveillance: Secondary | ICD-10-CM | POA: Diagnosis not present

## 2022-11-03 DIAGNOSIS — Z79899 Other long term (current) drug therapy: Secondary | ICD-10-CM | POA: Diagnosis not present

## 2022-11-03 DIAGNOSIS — Z90722 Acquired absence of ovaries, bilateral: Secondary | ICD-10-CM | POA: Diagnosis not present

## 2022-11-03 DIAGNOSIS — T50996A Underdosing of other drugs, medicaments and biological substances, initial encounter: Secondary | ICD-10-CM | POA: Diagnosis present

## 2022-11-03 DIAGNOSIS — Z9049 Acquired absence of other specified parts of digestive tract: Secondary | ICD-10-CM | POA: Diagnosis not present

## 2022-11-03 DIAGNOSIS — Z8249 Family history of ischemic heart disease and other diseases of the circulatory system: Secondary | ICD-10-CM | POA: Diagnosis not present

## 2022-11-03 DIAGNOSIS — I119 Hypertensive heart disease without heart failure: Secondary | ICD-10-CM | POA: Diagnosis present

## 2022-11-03 DIAGNOSIS — Z9079 Acquired absence of other genital organ(s): Secondary | ICD-10-CM | POA: Diagnosis not present

## 2022-11-03 DIAGNOSIS — Z6841 Body Mass Index (BMI) 40.0 and over, adult: Secondary | ICD-10-CM | POA: Diagnosis not present

## 2022-11-03 DIAGNOSIS — D72829 Elevated white blood cell count, unspecified: Secondary | ICD-10-CM | POA: Diagnosis not present

## 2022-11-03 DIAGNOSIS — Z91030 Bee allergy status: Secondary | ICD-10-CM | POA: Diagnosis not present

## 2022-11-03 DIAGNOSIS — J45901 Unspecified asthma with (acute) exacerbation: Secondary | ICD-10-CM | POA: Diagnosis present

## 2022-11-03 DIAGNOSIS — Z7951 Long term (current) use of inhaled steroids: Secondary | ICD-10-CM | POA: Diagnosis not present

## 2022-11-03 DIAGNOSIS — Z90710 Acquired absence of both cervix and uterus: Secondary | ICD-10-CM | POA: Diagnosis not present

## 2022-11-03 DIAGNOSIS — R0609 Other forms of dyspnea: Secondary | ICD-10-CM | POA: Diagnosis not present

## 2022-11-03 DIAGNOSIS — J4541 Moderate persistent asthma with (acute) exacerbation: Secondary | ICD-10-CM | POA: Diagnosis not present

## 2022-11-03 LAB — CBC
HCT: 41.3 % (ref 36.0–46.0)
Hemoglobin: 12.3 g/dL (ref 12.0–15.0)
MCH: 22.4 pg — ABNORMAL LOW (ref 26.0–34.0)
MCHC: 29.8 g/dL — ABNORMAL LOW (ref 30.0–36.0)
MCV: 75.2 fL — ABNORMAL LOW (ref 80.0–100.0)
Platelets: 376 10*3/uL (ref 150–400)
RBC: 5.49 MIL/uL — ABNORMAL HIGH (ref 3.87–5.11)
RDW: 16 % — ABNORMAL HIGH (ref 11.5–15.5)
WBC: 11.7 10*3/uL — ABNORMAL HIGH (ref 4.0–10.5)
nRBC: 0 % (ref 0.0–0.2)

## 2022-11-03 LAB — CREATININE, SERUM
Creatinine, Ser: 0.9 mg/dL (ref 0.44–1.00)
GFR, Estimated: >60 mL/min (ref >60)

## 2022-11-03 LAB — BASIC METABOLIC PANEL
Anion gap: 13 (ref 5–15)
BUN: 10 mg/dL (ref 6–20)
CO2: 20 mmol/L — ABNORMAL LOW (ref 22–32)
Calcium: 9 mg/dL (ref 8.9–10.3)
Chloride: 103 mmol/L (ref 98–111)
Creatinine, Ser: 0.92 mg/dL (ref 0.44–1.00)
GFR, Estimated: >60 mL/min (ref >60)
Glucose, Bld: 216 mg/dL — ABNORMAL HIGH (ref 70–99)
Potassium: 3.8 mmol/L (ref 3.5–5.1)
Sodium: 136 mmol/L (ref 135–145)

## 2022-11-03 LAB — HEMOGLOBIN A1C
Hgb A1c MFr Bld: 6 % — ABNORMAL HIGH (ref 4.8–5.6)
Mean Plasma Glucose: 125.5 mg/dL

## 2022-11-03 LAB — ECHOCARDIOGRAM COMPLETE
Area-P 1/2: 3.13 cm2
Calc EF: 67.6 %
Height: 62 in
Single Plane A2C EF: 71.9 %
Single Plane A4C EF: 68.1 %
Weight: 3680 oz

## 2022-11-03 LAB — HIV ANTIBODY (ROUTINE TESTING W REFLEX): HIV Screen 4th Generation wRfx: NONREACTIVE

## 2022-11-03 LAB — MAGNESIUM: Magnesium: 2.4 mg/dL (ref 1.7–2.4)

## 2022-11-03 MED ORDER — IPRATROPIUM-ALBUTEROL 0.5-2.5 (3) MG/3ML IN SOLN
3.0000 mL | Freq: Four times a day (QID) | RESPIRATORY_TRACT | Status: DC
  Administered 2022-11-03 – 2022-11-05 (×8): 3 mL via RESPIRATORY_TRACT
  Filled 2022-11-03 (×8): qty 3

## 2022-11-03 MED ORDER — PERFLUTREN LIPID MICROSPHERE
1.0000 mL | INTRAVENOUS | Status: AC | PRN
  Administered 2022-11-03: 4 mL via INTRAVENOUS

## 2022-11-03 MED ORDER — ALBUTEROL SULFATE (2.5 MG/3ML) 0.083% IN NEBU: 2.5000 mg | INHALATION_SOLUTION | RESPIRATORY_TRACT | Status: DC | PRN

## 2022-11-03 NOTE — Progress Notes (Signed)
Mobility Specialist - Progress Note   11/03/22 1309  Mobility  Activity Ambulated with assistance in hallway  Level of Assistance Modified independent, requires aide device or extra time  Assistive Device None  Distance Ambulated (ft) 200 ft  Activity Response Tolerated well  Mobility Referral Yes  $Mobility charge 1 Mobility   Pt received in bed and agreed to mobility, audibly wheezing throughout session.  Nurse requested Mobility Specialist to perform oxygen saturation test with pt which includes removing pt from oxygen both at rest and while ambulating.  Below are the results from that testing.     Patient Saturations on Room Air at Rest = spO2 92%  Patient Saturations on Room Air while Ambulating = sp02 94% .    At end of testing pt left in room on 1.5 Liters of oxygen.  Reported results to nurse.   Pt left in bed with all needs met.   Marilynne Halsted Mobility Specialist

## 2022-11-03 NOTE — Progress Notes (Signed)
PROGRESS NOTE    [name]  ZOX:[id number] DOB: 1976/05/19 DOA: 11/02/2022 PCP: Diamantina Providence, FNP    Brief Narrative:   [name] is a 47 y.o. female with past medical history significant for asthma, HTN, OSA, GERD, obesity who presented to [facility] ED on 4/22 with progressive shortness of breath, wheezing.  Onset 48 hours prior to admission.  Patient has missed several doses of her Dupixent injections due to insurance issues.  No other specific concerns.  Denies fever, no chills, no chest pain, no nausea/vomiting, no urinary symptoms.  In the ED, temperature 90.6 F, HR 106, RR 22, BP 175/116, SpO2 94% on room air.  WBC 12.3, hemoglobin 12.9, platelets 364.  Sodium 135, potassium 3.6, chloride 103, CO2 24, glucose 103, BUN 9, creatinine 0.80.  BNP 22.8.  Chest x-ray with cardiomegaly, vascular congestion.  Patient was given steroids, magnesium, neb treatments in the ED with some relief.  TRH consulted for admission for further evaluation and management of acute asthma exacerbation.  Assessment & Plan:   Acute asthma exacerbation Patient to ED with 2-day history of progressive shortness of breath associated with wheezing.  Patient has been out of her home Dupixent injections due to insurance issues which now have been resolved. -- Pulmicort neb twice daily -- Solu-Medrol 40 mg IV q12h -- Singulair -- Claritin -- DuoNebs every 6 hours scheduled -- Albuterol neb every 2 hours as needed shortness of breath/wheezing -- Continue supplemental oxygen, maintain SpO2 >92% -- Ambulatory O2 screen in the a.m.  Essential hypertension -- Losartan 25 mg PO daily -- Triamterene-HCTZ 37.5-25 mg p.o. daily  GERD -- Protonix 40 mg p.o. daily  Morbid Obesity Body mass index is 42.07 kg/m.  Discussed with patient needs for aggressive lifestyle changes/weight loss as this complicates all facets of care.  Outpatient follow-up with PCP.     DVT prophylaxis: enoxaparin  (LOVENOX) injection 40 mg Start: 11/02/22 2200    Code Status: Full Code Family Communication: No family present at bedside this morning  Disposition Plan:  Level of care: Telemetry Status is: Observation The patient remains OBS appropriate and will d/c before 2 midnights.    Consultants:  None  Procedures:  None  Antimicrobials:  None   Subjective: Patient seen examined bedside, resting at edge of bed.  Continues with shortness of breath and wheezing.  Eating breakfast.  Feels slightly better with neb treatments and IV steroids.  Patient reports that she now has access to her home Dupixent and took a dose on Saturday, last previous dose was early February.  No other specific questions or concerns at this time.  Denies headache, no dizziness, no fever/chills/night sweats, no nausea/vomiting/diarrhea, no chest pain, no palpitations, no abdominal pain, no focal weakness, no fatigue, no paresthesias.  No acute events overnight per nursing staff.  Objective: Vitals:   11/02/22 1855 11/02/22 2235 11/03/22 0513 11/03/22 0851  BP:  (!) 143/89 (!) 139/58   Pulse:  (!) 109 91   Resp:  15 16   Temp:  98.4 F (36.9 C) 98.3 F (36.8 C)   TempSrc:  Oral Oral   SpO2: 97% 98% 97% 96%  Weight:      Height:        Intake/Output Summary (Last 24 hours) at 11/03/2022 1228 Last data filed at 11/03/2022 1218 Gross per 24 hour  Intake 598 ml  Output --  Net 598 ml   Filed Weights   11/02/22 1532  Weight: 104.3 kg  Examination:  Physical Exam: GEN: NAD, alert and oriented x 3, obese HEENT: NCAT, PERRL, EOMI, sclera clear, MMM PULM: Diffuse wheezing throughout all lung fields bilaterally, no crackles, normal respiratory effort without accessory muscle use, on 2 L nasal cannula with SpO2 97% at rest CV: RRR w/o M/G/R GI: abd soft, NTND, NABS, no R/G/M MSK: no peripheral edema, muscle strength globally intact 5/5 bilateral upper/lower extremities NEURO: CN II-XII intact, no focal  deficits, sensation to light touch intact PSYCH: normal mood/affect Integumentary: dry/intact, no rashes or wounds    Data Reviewed: I have personally reviewed following labs and imaging studies  CBC: Recent Labs  Lab 11/02/22 1646 11/03/22 0134  WBC 12.3* 11.7*  NEUTROABS 9.8*  --   HGB 12.9 12.3  HCT 42.4 41.3  MCV 74.4* 75.2*  PLT 364 376   Basic Metabolic Panel: Recent Labs  Lab 11/02/22 1646 11/03/22 0134 11/03/22 0135  NA 135  --  136  K 3.6  --  3.8  CL 103  --  103  CO2 24  --  20*  GLUCOSE 103*  --  216*  BUN 9  --  10  CREATININE 0.80 0.90 0.92  CALCIUM 9.0  --  9.0  MG  --   --  2.4   GFR: Estimated Creatinine Clearance: 85.7 mL/min (by C-G formula based on SCr of 0.92 mg/dL). Liver Function Tests: No results for input(s): "AST", "ALT", "ALKPHOS", "BILITOT", "PROT", "ALBUMIN" in the last 168 hours. No results for input(s): "LIPASE", "AMYLASE" in the last 168 hours. No results for input(s): "AMMONIA" in the last 168 hours. Coagulation Profile: No results for input(s): "INR", "PROTIME" in the last 168 hours. Cardiac Enzymes: No results for input(s): "CKTOTAL", "CKMB", "CKMBINDEX", "TROPONINI" in the last 168 hours. BNP (last 3 results) No results for input(s): "PROBNP" in the last 8760 hours. HbA1C: Recent Labs    11/03/22 0134  HGBA1C 6.0*   CBG: No results for input(s): "GLUCAP" in the last 168 hours. Lipid Profile: No results for input(s): "CHOL", "HDL", "LDLCALC", "TRIG", "CHOLHDL", "LDLDIRECT" in the last 72 hours. Thyroid Function Tests: No results for input(s): "TSH", "T4TOTAL", "FREET4", "T3FREE", "THYROIDAB" in the last 72 hours. Anemia Panel: No results for input(s): "VITAMINB12", "FOLATE", "FERRITIN", "TIBC", "IRON", "RETICCTPCT" in the last 72 hours. Sepsis Labs: No results for input(s): "PROCALCITON", "LATICACIDVEN" in the last 168 hours.  No results found for this or any previous visit (from the past 240 hour(s)).        Radiology Studies: ECHOCARDIOGRAM COMPLETE  Result Date: 11/03/2022    ECHOCARDIOGRAM REPORT   Patient Name:   [name] Luca Date of Exam: 11/03/2022 Medical Rec #:  [id number]      Height:       62.0 in Accession #:    [id number]     Weight:       230.0 lb Date of Birth:  March 05, 1976      BSA:          2.029 m Patient Age:    46 years       BP:           139/58 mmHg Patient Gender: F              HR:           100 bpm. Exam Location:  Inpatient Procedure: 2D Echo, Cardiac Doppler, Color Doppler and Intracardiac            Opacification Agent Indications:    dyspnea  History:        Patient has no prior history of Echocardiogram examinations.                 Risk Factors:Hypertension.  Sonographer:    Mike Gip Referring Phys: [phone] CARLOS MADERA IMPRESSIONS  1. There is a left ventricular mid cavity "gradient" (up to 2.5 m/s following the Valsalva maneuver), but there does not appear to be true LV outflow obstruction. This is often seen with hypovolemia or high cardiac output due to sepsis, etc. Left ventricular ejection fraction, by estimation, is 70 to 75%. The left ventricle has hyperdynamic function. The left ventricle has no regional wall motion abnormalities. Left ventricular diastolic parameters were normal.  2. Right ventricular systolic function is normal. The right ventricular size is normal.  3. The mitral valve was not well visualized. No evidence of mitral valve regurgitation. No evidence of mitral stenosis.  4. The aortic valve was not well visualized. Aortic valve regurgitation is not visualized.  5. The inferior vena cava is normal in size with greater than 50% respiratory variability, suggesting right atrial pressure of 3 mmHg. FINDINGS  Left Ventricle: There is a left ventricular mid cavity "gradient" (up to 2.5 m/s following the Valsalva maneuver), but there does not appear to be true LV outflow obstruction. This is often seen with hypovolemia or high cardiac output due to sepsis,  etc. Left ventricular ejection fraction, by estimation, is 70 to 75%. The left ventricle has hyperdynamic function. The left ventricle has no regional wall motion abnormalities. Definity contrast agent was given IV to delineate the left ventricular endocardial borders. The left ventricular internal cavity size was normal in size. There is no left ventricular hypertrophy. Left ventricular diastolic parameters were normal. Normal left ventricular filling pressure. Right Ventricle: The right ventricular size is normal. No increase in right ventricular wall thickness. Right ventricular systolic function is normal. Left Atrium: Left atrial size was normal in size. Right Atrium: Right atrial size was normal in size. Pericardium: There is no evidence of pericardial effusion. Mitral Valve: The mitral valve was not well visualized. No evidence of mitral valve regurgitation. No evidence of mitral valve stenosis. Tricuspid Valve: The tricuspid valve is not well visualized. Tricuspid valve regurgitation is not demonstrated. Aortic Valve: The aortic valve was not well visualized. Aortic valve regurgitation is not visualized. Pulmonic Valve: The pulmonic valve was not well visualized. Pulmonic valve regurgitation is not visualized. No evidence of pulmonic stenosis. Aorta: The aortic root and ascending aorta are structurally normal, with no evidence of dilitation. Venous: The inferior vena cava is normal in size with greater than 50% respiratory variability, suggesting right atrial pressure of 3 mmHg. IAS/Shunts: The interatrial septum was not well visualized.  LEFT VENTRICLE PLAX 2D LVOT diam:     2.10 cm      Diastology LV SV:         88           LV e' medial:    8.38 cm/s LV SV Index:   43           LV E/e' medial:  6.9 LVOT Area:     3.46 cm     LV e' lateral:   10.60 cm/s                             LV E/e' lateral: 5.5  LV Volumes (MOD) LV vol d, MOD A2C: 108.0 ml LV vol  d, MOD A4C: 144.0 ml LV vol s, MOD A2C: 30.4 ml LV  vol s, MOD A4C: 46.0 ml LV SV MOD A2C:     77.6 ml LV SV MOD A4C:     144.0 ml LV SV MOD BP:      86.1 ml RIGHT VENTRICLE             IVC RV S prime:     15.70 cm/s  IVC diam: 2.00 cm TAPSE (M-mode): 2.3 cm LEFT ATRIUM             Index LA Vol (A2C):   39.6 ml 19.52 ml/m LA Vol (A4C):   27.5 ml 13.56 ml/m LA Biplane Vol: 35.5 ml 17.50 ml/m  AORTIC VALVE LVOT Vmax:   129.00 cm/s LVOT Vmean:  80.500 cm/s LVOT VTI:    0.254 m  AORTA Ao Root diam: 3.30 cm MITRAL VALVE MV Area (PHT): 3.13 cm    SHUNTS MV Decel Time: 242 msec    Systemic VTI:  0.25 m MV E velocity: 58.10 cm/s  Systemic Diam: 2.10 cm MV A velocity: 63.00 cm/s MV E/A ratio:  0.92 Mihai Croitoru MD Electronically signed by Thurmon Fair MD Signature Date/Time: 11/03/2022/11:26:50 AM    Final    DG Chest Port 1 View  Result Date: 11/02/2022 CLINICAL DATA:  Shortness of breath EXAM: PORTABLE CHEST 1 VIEW COMPARISON:  11/02/2019 FINDINGS: Cardiac silhouette is enlarged with central vascular congestion. Slight rotation to the right. Similar diffuse nonspecific interstitial prominence. No definite focal pneumonia, collapse or consolidation. No large effusion or pneumothorax. No acute osseous finding. IMPRESSION: Cardiomegaly with vascular congestion. Electronically Signed   By: Judie Petit.  Shick M.D.   On: 11/02/2022 16:29        Scheduled Meds:  budesonide (PULMICORT) nebulizer solution  0.5 mg Nebulization BID   enoxaparin (LOVENOX) injection  40 mg Subcutaneous Q24H   loratadine  10 mg Oral Daily   losartan  25 mg Oral Daily   methylPREDNISolone (SOLU-MEDROL) injection  40 mg Intravenous Q12H   montelukast  10 mg Oral QHS   pantoprazole  40 mg Oral Daily   triamterene-hydrochlorothiazide  1 tablet Oral Daily   Continuous Infusions:   LOS: 0 days    Time spent: 51 minutes spent on chart review, discussion with nursing staff, consultants, updating family and interview/physical exam; more than 50% of that time was spent in counseling and/or  coordination of care.    Alvira Philips [place], DO [facility] Available via Epic secure chat 7am-7pm After these hours, please refer to coverage provider listed on [email or web address] 11/03/2022, 12:28 PM

## 2022-11-03 NOTE — Progress Notes (Signed)
  Transition of Care [facility]) Screening Note   Patient Details  Name: [name] Date of Birth: February 01, 1976   Transition of Care [facility]) CM/SW Contact:    Otelia Santee, LCSW Phone Number: 11/03/2022, 9:48 AM    Transition of Care Department [facility]) has reviewed patient and no TOC needs have been identified at this time. We will continue to monitor patient advancement through interdisciplinary progression rounds. If new patient transition needs arise, please place a TOC consult.

## 2022-11-03 NOTE — Progress Notes (Signed)
Echocardiogram 2D Echocardiogram has been performed.  Toni Amend 11/03/2022, 11:08 AM

## 2022-11-04 DIAGNOSIS — J4541 Moderate persistent asthma with (acute) exacerbation: Secondary | ICD-10-CM | POA: Diagnosis not present

## 2022-11-04 MED ORDER — DIPHENHYDRAMINE HCL 25 MG PO CAPS: 25.0000 mg | ORAL_CAPSULE | Freq: Once | ORAL | Status: DC

## 2022-11-04 MED ORDER — DIPHENHYDRAMINE HCL 25 MG PO CAPS
25.0000 mg | ORAL_CAPSULE | ORAL | Status: AC | PRN
  Administered 2022-11-04: 25 mg via ORAL
  Filled 2022-11-04: qty 1

## 2022-11-04 NOTE — Progress Notes (Signed)
Mobility Specialist - Progress Note   11/04/22 1029  Oxygen Therapy  SpO2 96 %  O2 Device Room Air  Mobility  Activity Ambulated with assistance in hallway  Level of Assistance Standby assist, set-up cues, supervision of patient - no hands on  Assistive Device None  Distance Ambulated (ft) 500 ft  Activity Response Tolerated well  Mobility Referral Yes  $Mobility charge 1 Mobility   Pt received in bed and agreed to mobility, had no issues throughout session,  Nurse requested Mobility Specialist to perform oxygen saturation test with pt which includes removing pt from oxygen both at rest and while ambulating.  Below are the results from that testing.     Patient Saturations on Room Air at Rest = spO2 94%  Patient Saturations on Room Air while Ambulating = sp02 96% .    At end of testing pt left in room on Room Air.  Reported results to nurse.   Pt returned to bed with all needs met.   Marilynne Halsted Mobility Specialist

## 2022-11-04 NOTE — Progress Notes (Signed)
PROGRESS NOTE    [name]  ZOX:[id number] DOB: Jan 07, 1976 DOA: 11/02/2022 PCP: Diamantina Providence, FNP    Brief Narrative:   [name] is [name] 47 y.o. female with past medical history significant for asthma, HTN, OSA, GERD, obesity who presented to [facility] ED on 4/22 with progressive shortness of breath, wheezing.  Onset 48 hours prior to admission.  Patient has missed several doses of her Dupixent injections due to insurance issues.  No other specific concerns.  Denies fever, no chills, no chest pain, no nausea/vomiting, no urinary symptoms.  In the ED, temperature 90.6 F, HR 106, RR 22, BP 175/116, SpO2 94% on room air.  WBC 12.3, hemoglobin 12.9, platelets 364.  Sodium 135, potassium 3.6, chloride 103, CO2 24, glucose 103, BUN 9, creatinine 0.80.  BNP 22.8.  Chest x-ray with cardiomegaly, vascular congestion.  Patient was given steroids, magnesium, neb treatments in the ED with some relief.  TRH consulted for admission for further evaluation and management of acute asthma exacerbation.  Assessment & Plan:   Acute asthma exacerbation Patient to ED with 2-day history of progressive shortness of breath associated with wheezing.  Patient has been out of her home Dupixent injections due to insurance issues which now have been resolved. -- Pulmicort neb twice daily -- Solu-Medrol 40 mg IV q12h -- Singulair -- Claritin -- DuoNebs every 6 hours scheduled -- Albuterol neb every 2 hours as needed shortness of breath/wheezing -- Continue supplemental oxygen, maintain SpO2 >92% -- Ambulatory O2 screen in the [name].m.  Essential hypertension -- Losartan 25 mg PO daily -- Triamterene-HCTZ 37.5-25 mg p.o. daily  GERD -- Protonix 40 mg p.o. daily  Morbid Obesity Body mass index is 52.58 kg/m.  Discussed with patient needs for aggressive lifestyle changes/weight loss as this complicates all facets of care.  Outpatient follow-up with PCP.     DVT prophylaxis: enoxaparin  (LOVENOX) injection 40 mg Start: 11/02/22 2200    Code Status: Full Code Family Communication: No family present at bedside this morning  Disposition Plan:  Level of care: Med-Surg Status is: Inpatient Remains inpatient appropriate because: IV steroids, scheduled neb treatments, needs titration off of Supple oxygen, anticipate discharge home likely tomorrow   Consultants:  None  Procedures:  None  Antimicrobials:  None   Subjective: Patient seen examined bedside, resting at edge of bed.  Shortness of breath and wheezing much improved today.  Remains on oxygen, 2 L per nasal cannula in which she is not oxygen dependent at baseline.  Discussed we will continue IV steroids, scheduled neb treatments today and will perform ambulatory O2 screen and try to titrate off of Supple oxygen.  Anticipate likely discharge home tomorrow.  No other specific questions or concerns at this time.  Denies headache, no dizziness, no fever/chills/night sweats, no nausea/vomiting/diarrhea, no chest pain, no palpitations, no abdominal pain, no focal weakness, no fatigue, no paresthesias.  No acute events overnight per nursing staff.  Objective: Vitals:   11/04/22 0800 11/04/22 0822 11/04/22 1029 11/04/22 1216  BP:    118/73  Pulse:    64  Resp:    18  Temp:    98 F (36.7 C)  TempSrc:    Oral  SpO2:  99% 96% 98%  Weight: 130.4 kg     Height:        Intake/Output Summary (Last 24 hours) at 11/04/2022 1336 Last data filed at 11/03/2022 1835 Gross per 24 hour  Intake 237 ml  Output --  Net 237 ml   Filed Weights   11/02/22 1532 11/04/22 0800  Weight: 104.3 kg 130.4 kg    Examination:  Physical Exam: GEN: NAD, alert and oriented x 3, obese HEENT: NCAT, PERRL, EOMI, sclera clear, MMM PULM: Mid-to-late expiratory wheezing throughout all lung fields bilaterally, no crackles, normal respiratory effort without accessory muscle use, on 2 L nasal cannula with SpO2 99% at rest CV: RRR w/o M/G/R GI:  abd soft, NTND, NABS, no R/G/M MSK: no peripheral edema, muscle strength globally intact 5/5 bilateral upper/lower extremities NEURO: CN II-XII intact, no focal deficits, sensation to light touch intact PSYCH: normal mood/affect Integumentary: dry/intact, no rashes or wounds    Data Reviewed: I have personally reviewed following labs and imaging studies  CBC: Recent Labs  Lab 11/02/22 1646 11/03/22 0134  WBC 12.3* 11.7*  NEUTROABS 9.8*  --   HGB 12.9 12.3  HCT 42.4 41.3  MCV 74.4* 75.2*  PLT 364 376   Basic Metabolic Panel: Recent Labs  Lab 11/02/22 1646 11/03/22 0134 11/03/22 0135  NA 135  --  136  K 3.6  --  3.8  CL 103  --  103  CO2 24  --  20*  GLUCOSE 103*  --  216*  BUN 9  --  10  CREATININE 0.80 0.90 0.92  CALCIUM 9.0  --  9.0  MG  --   --  2.4   GFR: Estimated Creatinine Clearance: 98.1 mL/min (by C-G formula based on SCr of 0.92 mg/dL). Liver Function Tests: No results for input(s): "AST", "ALT", "ALKPHOS", "BILITOT", "PROT", "ALBUMIN" in the last 168 hours. No results for input(s): "LIPASE", "AMYLASE" in the last 168 hours. No results for input(s): "AMMONIA" in the last 168 hours. Coagulation Profile: No results for input(s): "INR", "PROTIME" in the last 168 hours. Cardiac Enzymes: No results for input(s): "CKTOTAL", "CKMB", "CKMBINDEX", "TROPONINI" in the last 168 hours. BNP (last 3 results) No results for input(s): "PROBNP" in the last 8760 hours. HbA1C: Recent Labs    11/03/22 0134  HGBA1C 6.0*   CBG: No results for input(s): "GLUCAP" in the last 168 hours. Lipid Profile: No results for input(s): "CHOL", "HDL", "LDLCALC", "TRIG", "CHOLHDL", "LDLDIRECT" in the last 72 hours. Thyroid Function Tests: No results for input(s): "TSH", "T4TOTAL", "FREET4", "T3FREE", "THYROIDAB" in the last 72 hours. Anemia Panel: No results for input(s): "VITAMINB12", "FOLATE", "FERRITIN", "TIBC", "IRON", "RETICCTPCT" in the last 72 hours. Sepsis Labs: No results  for input(s): "PROCALCITON", "LATICACIDVEN" in the last 168 hours.  No results found for this or any previous visit (from the past 240 hour(s)).       Radiology Studies: ECHOCARDIOGRAM COMPLETE  Result Date: 11/03/2022    ECHOCARDIOGRAM REPORT   Patient Name:   Breanna ALDAZ Lukach Date of Exam: 11/03/2022 Medical Rec #:  [id number]      Height:       62.0 in Accession #:    [id number]     Weight:       230.0 lb Date of Birth:  1975/11/09      BSA:          2.029 m Patient Age:    46 years       BP:           139/58 mmHg Patient Gender: F              HR:           100 bpm. Exam Location:  Inpatient Procedure: 2D Echo, Cardiac Doppler,  Color Doppler and Intracardiac            Opacification Agent Indications:    dyspnea  History:        Patient has no prior history of Echocardiogram examinations.                 Risk Factors:Hypertension.  Sonographer:    Mike Gip Referring Phys: [phone] CARLOS MADERA IMPRESSIONS  1. There is [name] left ventricular mid cavity "gradient" (up to 2.5 m/s following the Valsalva maneuver), but there does not appear to be true LV outflow obstruction. This is often seen with hypovolemia or high cardiac output due to sepsis, etc. Left ventricular ejection fraction, by estimation, is 70 to 75%. The left ventricle has hyperdynamic function. The left ventricle has no regional wall motion abnormalities. Left ventricular diastolic parameters were normal.  2. Right ventricular systolic function is normal. The right ventricular size is normal.  3. The mitral valve was not well visualized. No evidence of mitral valve regurgitation. No evidence of mitral stenosis.  4. The aortic valve was not well visualized. Aortic valve regurgitation is not visualized.  5. The inferior vena cava is normal in size with greater than 50% respiratory variability, suggesting right atrial pressure of 3 mmHg. FINDINGS  Left Ventricle: There is [name] left ventricular mid cavity "gradient" (up to 2.5 m/s following the  Valsalva maneuver), but there does not appear to be true LV outflow obstruction. This is often seen with hypovolemia or high cardiac output due to sepsis, etc. Left ventricular ejection fraction, by estimation, is 70 to 75%. The left ventricle has hyperdynamic function. The left ventricle has no regional wall motion abnormalities. Definity contrast agent was given IV to delineate the left ventricular endocardial borders. The left ventricular internal cavity size was normal in size. There is no left ventricular hypertrophy. Left ventricular diastolic parameters were normal. Normal left ventricular filling pressure. Right Ventricle: The right ventricular size is normal. No increase in right ventricular wall thickness. Right ventricular systolic function is normal. Left Atrium: Left atrial size was normal in size. Right Atrium: Right atrial size was normal in size. Pericardium: There is no evidence of pericardial effusion. Mitral Valve: The mitral valve was not well visualized. No evidence of mitral valve regurgitation. No evidence of mitral valve stenosis. Tricuspid Valve: The tricuspid valve is not well visualized. Tricuspid valve regurgitation is not demonstrated. Aortic Valve: The aortic valve was not well visualized. Aortic valve regurgitation is not visualized. Pulmonic Valve: The pulmonic valve was not well visualized. Pulmonic valve regurgitation is not visualized. No evidence of pulmonic stenosis. Aorta: The aortic root and ascending aorta are structurally normal, with no evidence of dilitation. Venous: The inferior vena cava is normal in size with greater than 50% respiratory variability, suggesting right atrial pressure of 3 mmHg. IAS/Shunts: The interatrial septum was not well visualized.  LEFT VENTRICLE PLAX 2D LVOT diam:     2.10 cm      Diastology LV SV:         88           LV e' medial:    8.38 cm/s LV SV Index:   43           LV E/e' medial:  6.9 LVOT Area:     3.46 cm     LV e' lateral:   10.60 cm/s  LV E/e' lateral: 5.5  LV Volumes (MOD) LV vol d, MOD A2C: 108.0 ml LV vol d, MOD A4C: 144.0 ml LV vol s, MOD A2C: 30.4 ml LV vol s, MOD A4C: 46.0 ml LV SV MOD A2C:     77.6 ml LV SV MOD A4C:     144.0 ml LV SV MOD BP:      86.1 ml RIGHT VENTRICLE             IVC RV S prime:     15.70 cm/s  IVC diam: 2.00 cm TAPSE (M-mode): 2.3 cm LEFT ATRIUM             Index LA Vol (A2C):   39.6 ml 19.52 ml/m LA Vol (A4C):   27.5 ml 13.56 ml/m LA Biplane Vol: 35.5 ml 17.50 ml/m  AORTIC VALVE LVOT Vmax:   129.00 cm/s LVOT Vmean:  80.500 cm/s LVOT VTI:    0.254 m  AORTA Ao Root diam: 3.30 cm MITRAL VALVE MV Area (PHT): 3.13 cm    SHUNTS MV Decel Time: 242 msec    Systemic VTI:  0.25 m MV E velocity: 58.10 cm/s  Systemic Diam: 2.10 cm MV [name] velocity: 63.00 cm/s MV E/[name] ratio:  0.92 Mihai Croitoru MD Electronically signed by Thurmon Fair MD Signature Date/Time: 11/03/2022/11:26:50 AM    Final    DG Chest Port 1 View  Result Date: 11/02/2022 CLINICAL DATA:  Shortness of breath EXAM: PORTABLE CHEST 1 VIEW COMPARISON:  11/02/2019 FINDINGS: Cardiac silhouette is enlarged with central vascular congestion. Slight rotation to the right. Similar diffuse nonspecific interstitial prominence. No definite focal pneumonia, collapse or consolidation. No large effusion or pneumothorax. No acute osseous finding. IMPRESSION: Cardiomegaly with vascular congestion. Electronically Signed   By: Judie Petit.  Shick M.D.   On: 11/02/2022 16:29        Scheduled Meds:  budesonide (PULMICORT) nebulizer solution  0.5 mg Nebulization BID   enoxaparin (LOVENOX) injection  40 mg Subcutaneous Q24H   ipratropium-albuterol  3 mL Nebulization Q6H   loratadine  10 mg Oral Daily   losartan  25 mg Oral Daily   methylPREDNISolone (SOLU-MEDROL) injection  40 mg Intravenous Q12H   montelukast  10 mg Oral QHS   pantoprazole  40 mg Oral Daily   triamterene-hydrochlorothiazide  1 tablet Oral Daily   Continuous Infusions:   LOS: 1 day     Time spent: 51 minutes spent on chart review, discussion with nursing staff, consultants, updating family and interview/physical exam; more than 50% of that time was spent in counseling and/or coordination of care.    Alvira Philips [place], DO [facility] Available via Epic secure chat 7am-7pm After these hours, please refer to coverage provider listed on [email or web address] 11/04/2022, 1:36 PM

## 2022-11-05 DIAGNOSIS — J4541 Moderate persistent asthma with (acute) exacerbation: Secondary | ICD-10-CM | POA: Diagnosis not present

## 2022-11-05 MED ORDER — ALBUTEROL SULFATE HFA 108 (90 BASE) MCG/ACT IN AERS
2.0000 | INHALATION_SPRAY | Freq: Four times a day (QID) | RESPIRATORY_TRACT | 1 refills | Status: DC | PRN
Start: 2022-11-05 — End: 2023-10-22
  Filled 2023-05-18: qty 18, 25d supply, fill #0

## 2022-11-05 MED ORDER — METHYLPREDNISOLONE 4 MG PO TBPK: ORAL_TABLET | ORAL | 0 refills | Status: AC

## 2022-11-05 MED ORDER — PREDNISONE 10 MG PO TABS: ORAL_TABLET | ORAL | 0 refills | Status: DC

## 2022-11-05 MED ORDER — FLUCONAZOLE 100 MG PO TABS: 100.0000 mg | ORAL_TABLET | Freq: Every day | ORAL | 0 refills | Status: AC

## 2022-11-05 NOTE — Discharge Summary (Addendum)
Physician Discharge Summary  [name] HYQ:[id number] DOB: 07/06/1976 DOA: 11/02/2022  PCP: Diamantina Providence, FNP  Admit date: 11/02/2022 Discharge date: 11/05/2022  Admitted From: Home Disposition: Home  Recommendations for Outpatient Follow-up:  Follow up with PCP in 1-2 weeks Continue medrol taper on discharge  Home Health: No Equipment/Devices: None  Discharge Condition: Stable CODE STATUS: Full code Diet recommendation: Heart healthy diet  History of present illness:  [name] is a 47 y.o. female with past medical history significant for asthma, HTN, OSA, GERD, obesity who presented to [facility] ED on 4/22 with progressive shortness of breath, wheezing.  Onset 48 hours prior to admission.  Patient has missed several doses of her Dupixent injections due to insurance issues.  No other specific concerns.  Denies fever, no chills, no chest pain, no nausea/vomiting, no urinary symptoms.   In the ED, temperature 90.6 F, HR 106, RR 22, BP 175/116, SpO2 94% on room air.  WBC 12.3, hemoglobin 12.9, platelets 364.  Sodium 135, potassium 3.6, chloride 103, CO2 24, glucose 103, BUN 9, creatinine 0.80.  BNP 22.8.  Chest x-ray with cardiomegaly, vascular congestion.  Patient was given steroids, magnesium, neb treatments in the ED with some relief.  TRH consulted for admission for further evaluation and management of acute asthma exacerbation.  Hospital course:  Acute asthma exacerbation Patient to ED with 2-day history of progressive shortness of breath associated with wheezing.  Patient has been out of her home Dupixent injections due to insurance issues which now have been resolved.  Patient was started on scheduled neb treatments, IV steroids with gradual improvement of symptoms.  Patient was weaned off of supplemental oxygen.  Will continue medrol taper on discharge.  Continue Dupixent injections and Symbicort.  Outpatient follow-up with PCP.  Essential  hypertension Continue losartan 25 mg PO daily, Triamterene-HCTZ 37.5-25 mg p.o. daily   Morbid Obesity Body mass index is 42.07 kg/m.  Discussed with patient needs for aggressive lifestyle changes/weight loss as this complicates all facets of care.  Outpatient follow-up with PCP.   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   Hyperglycemia   GERD (gastroesophageal reflux disease)   Tachycardia   Acute asthma exacerbation    Discharge Instructions  Discharge Instructions     Call MD for:  difficulty breathing, headache or visual disturbances   Complete by: As directed    Call MD for:  extreme fatigue   Complete by: As directed    Call MD for:  persistant dizziness or light-headedness   Complete by: As directed    Call MD for:  persistant nausea and vomiting   Complete by: As directed    Call MD for:  severe uncontrolled pain   Complete by: As directed    Call MD for:  temperature >100.4   Complete by: As directed    Diet - low sodium heart healthy   Complete by: As directed    Increase activity slowly   Complete by: As directed       Allergies as of 11/05/2022       Reactions   Bee Venom Anaphylaxis   Patient has an epi pen, also has not been stung before but was noted to be allergic due to allergy testing    Prednisone Hives, Swelling, Itching, Nausea Only, Rash  Patient has been rechallenged with prednisone and experienced the same symptoms of hives and face swelling.  She can tolerate methylprednisolone IV and PO.        Medication List     STOP taking these medications    amLODipine 10 MG tablet Commonly known as: NORVASC   budesonide 0.25 MG/2ML nebulizer solution Commonly known as: Pulmicort   Chlorphen-PE-Acetaminophen 4-10-325 MG Tabs   diclofenac Sodium 1 % Gel Commonly known  as: Voltaren   EPINEPHrine 0.3 mg/0.3 mL Soaj injection Commonly known as: EPI-PEN   ibuprofen 600 MG tablet Commonly known as: ADVIL   ipratropium-albuterol 0.5-2.5 (3) MG/3ML Soln Commonly known as: DUONEB   levocetirizine 5 MG tablet Commonly known as: XYZAL   omeprazole 20 MG capsule Commonly known as: PRILOSEC       TAKE these medications    albuterol 108 (90 Base) MCG/ACT inhaler Commonly known as: VENTOLIN HFA Inhale 2 puffs into the lungs every 6 (six) hours as needed for wheezing or shortness of breath.   budesonide-formoterol 160-4.5 MCG/ACT inhaler Commonly known as: Symbicort Inhale 2 puffs into the lungs 2 (two) times daily.   DUPIXENT [place] Inject 1 Syringe into the skin every 14 (fourteen) days.   fluconazole 100 MG tablet Commonly known as: Diflucan Take 1 tablet (100 mg total) by mouth daily for 13 days.   losartan 25 MG tablet Commonly known as: COZAAR Take 25 mg by mouth daily.   methylPREDNISolone 4 MG Tbpk tablet Commonly known as: MEDROL DOSEPAK Take 8 tablets (32 mg total) by mouth daily for 3 days, THEN 6 tablets (24 mg total) daily for 3 days, THEN 4 tablets (16 mg total) daily for 3 days, THEN 2 tablets (8 mg total) daily for 3 days. Start taking on: November 06, 2022   triamterene-hydrochlorothiazide 37.5-25 MG tablet Commonly known as: MAXZIDE-25 Take 1 tablet by mouth daily.        Follow-up Information     Diamantina Providence, FNP. Schedule an appointment as soon as possible for a visit in 1 week(s).   Specialty: Nurse Practitioner Contact information: [address] [place] [address] [phone]                Allergies  Allergen Reactions   Bee Venom Anaphylaxis    Patient has an epi pen, also has not been stung before but was noted to be allergic due to allergy testing    Prednisone Hives, Swelling, Itching, Nausea Only and Rash    Patient has been rechallenged with prednisone and experienced the  same symptoms of hives and face swelling.  She can tolerate methylprednisolone IV and PO.    Consultations: None   Procedures/Studies: ECHOCARDIOGRAM COMPLETE  Result Date: 11/03/2022    ECHOCARDIOGRAM REPORT   Patient Name:   [name] Date of Exam: 11/03/2022 Medical Rec #:  [id number]      Height:       62.0 in Accession #:    [id number]     Weight:       230.0 lb Date of Birth:  12/09/1975      BSA:          2.029 m Patient Age:    46 years       BP:           139/58 mmHg Patient Gender: F              HR:  100 bpm. Exam Location:  Inpatient Procedure: 2D Echo, Cardiac Doppler, Color Doppler and Intracardiac            Opacification Agent Indications:    dyspnea  History:        Patient has no prior history of Echocardiogram examinations.                 Risk Factors:Hypertension.  Sonographer:    Mike Gip Referring Phys: [id number] CARLOS MADERA IMPRESSIONS  1. There is a left ventricular mid cavity "gradient" (up to 2.5 m/s following the Valsalva maneuver), but there does not appear to be true LV outflow obstruction. This is often seen with hypovolemia or high cardiac output due to sepsis, etc. Left ventricular ejection fraction, by estimation, is 70 to 75%. The left ventricle has hyperdynamic function. The left ventricle has no regional wall motion abnormalities. Left ventricular diastolic parameters were normal.  2. Right ventricular systolic function is normal. The right ventricular size is normal.  3. The mitral valve was not well visualized. No evidence of mitral valve regurgitation. No evidence of mitral stenosis.  4. The aortic valve was not well visualized. Aortic valve regurgitation is not visualized.  5. The inferior vena cava is normal in size with greater than 50% respiratory variability, suggesting right atrial pressure of 3 mmHg. FINDINGS  Left Ventricle: There is a left ventricular mid cavity "gradient" (up to 2.5 m/s following the Valsalva maneuver), but there does not  appear to be true LV outflow obstruction. This is often seen with hypovolemia or high cardiac output due to sepsis, etc. Left ventricular ejection fraction, by estimation, is 70 to 75%. The left ventricle has hyperdynamic function. The left ventricle has no regional wall motion abnormalities. Definity contrast agent was given IV to delineate the left ventricular endocardial borders. The left ventricular internal cavity size was normal in size. There is no left ventricular hypertrophy. Left ventricular diastolic parameters were normal. Normal left ventricular filling pressure. Right Ventricle: The right ventricular size is normal. No increase in right ventricular wall thickness. Right ventricular systolic function is normal. Left Atrium: Left atrial size was normal in size. Right Atrium: Right atrial size was normal in size. Pericardium: There is no evidence of pericardial effusion. Mitral Valve: The mitral valve was not well visualized. No evidence of mitral valve regurgitation. No evidence of mitral valve stenosis. Tricuspid Valve: The tricuspid valve is not well visualized. Tricuspid valve regurgitation is not demonstrated. Aortic Valve: The aortic valve was not well visualized. Aortic valve regurgitation is not visualized. Pulmonic Valve: The pulmonic valve was not well visualized. Pulmonic valve regurgitation is not visualized. No evidence of pulmonic stenosis. Aorta: The aortic root and ascending aorta are structurally normal, with no evidence of dilitation. Venous: The inferior vena cava is normal in size with greater than 50% respiratory variability, suggesting right atrial pressure of 3 mmHg. IAS/Shunts: The interatrial septum was not well visualized.  LEFT VENTRICLE PLAX 2D LVOT diam:     2.10 cm      Diastology LV SV:         88           LV e' medial:    8.38 cm/s LV SV Index:   43           LV E/e' medial:  6.9 LVOT Area:     3.46 cm     LV e' lateral:   10.60 cm/s  LV E/e'  lateral: 5.5  LV Volumes (MOD) LV vol d, MOD A2C: 108.0 ml LV vol d, MOD A4C: 144.0 ml LV vol s, MOD A2C: 30.4 ml LV vol s, MOD A4C: 46.0 ml LV SV MOD A2C:     77.6 ml LV SV MOD A4C:     144.0 ml LV SV MOD BP:      86.1 ml RIGHT VENTRICLE             IVC RV S prime:     15.70 cm/s  IVC diam: 2.00 cm TAPSE (M-mode): 2.3 cm LEFT ATRIUM             Index LA Vol (A2C):   39.6 ml 19.52 ml/m LA Vol (A4C):   27.5 ml 13.56 ml/m LA Biplane Vol: 35.5 ml 17.50 ml/m  AORTIC VALVE LVOT Vmax:   129.00 cm/s LVOT Vmean:  80.500 cm/s LVOT VTI:    0.254 m  AORTA Ao Root diam: 3.30 cm MITRAL VALVE MV Area (PHT): 3.13 cm    SHUNTS MV Decel Time: 242 msec    Systemic VTI:  0.25 m MV E velocity: 58.10 cm/s  Systemic Diam: 2.10 cm MV A velocity: 63.00 cm/s MV E/A ratio:  0.92 Mihai Croitoru MD Electronically signed by Thurmon Fair MD Signature Date/Time: 11/03/2022/11:26:50 AM    Final    DG Chest Port 1 View  Result Date: 11/02/2022 CLINICAL DATA:  Shortness of breath EXAM: PORTABLE CHEST 1 VIEW COMPARISON:  11/02/2019 FINDINGS: Cardiac silhouette is enlarged with central vascular congestion. Slight rotation to the right. Similar diffuse nonspecific interstitial prominence. No definite focal pneumonia, collapse or consolidation. No large effusion or pneumothorax. No acute osseous finding. IMPRESSION: Cardiomegaly with vascular congestion. Electronically Signed   By: Judie Petit.  Shick M.D.   On: 11/02/2022 16:29     Subjective:   Discharge Exam: Vitals:   11/05/22 0517 11/05/22 0737  BP: (!) 148/99   Pulse: 93   Resp: 18   Temp: 98.3 F (36.8 C)   SpO2: 96% 99%   Vitals:   11/04/22 2130 11/04/22 2133 11/05/22 0517 11/05/22 0737  BP:   (!) 148/99   Pulse:   93   Resp:   18   Temp:   98.3 F (36.8 C)   TempSrc:   Oral   SpO2: 91% 91% 96% 99%  Weight:      Height:        Physical Exam: GEN: NAD, alert and oriented x 3, obese HEENT: NCAT, PERRL, EOMI, sclera clear, MMM PULM: Late expiratory wheezing bilateral  upper lung fields, no crackles, normal respiratory effort without accessory muscle use, on room air CV: RRR w/o M/G/R GI: abd soft, NTND, NABS, no R/G/M MSK: no peripheral edema, muscle strength globally intact 5/5 bilateral upper/lower extremities NEURO: CN II-XII intact, no focal deficits, sensation to light touch intact PSYCH: normal mood/affect Integumentary: dry/intact, no rashes or wounds    The results of significant diagnostics from this hospitalization (including imaging, microbiology, ancillary and laboratory) are listed below for reference.     Microbiology: No results found for this or any previous visit (from the past 240 hour(s)).   Labs: BNP (last 3 results) Recent Labs    11/02/22 1646  BNP 22.8   Basic Metabolic Panel: Recent Labs  Lab 11/02/22 1646 11/03/22 0134 11/03/22 0135  NA 135  --  136  K 3.6  --  3.8  CL 103  --  103  CO2 24  --  20*  GLUCOSE 103*  --  216*  BUN 9  --  10  CREATININE 0.80 0.90 0.92  CALCIUM 9.0  --  9.0  MG  --   --  2.4   Liver Function Tests: No results for input(s): "AST", "ALT", "ALKPHOS", "BILITOT", "PROT", "ALBUMIN" in the last 168 hours. No results for input(s): "LIPASE", "AMYLASE" in the last 168 hours. No results for input(s): "AMMONIA" in the last 168 hours. CBC: Recent Labs  Lab 11/02/22 1646 11/03/22 0134  WBC 12.3* 11.7*  NEUTROABS 9.8*  --   HGB 12.9 12.3  HCT 42.4 41.3  MCV 74.4* 75.2*  PLT 364 376   Cardiac Enzymes: No results for input(s): "CKTOTAL", "CKMB", "CKMBINDEX", "TROPONINI" in the last 168 hours. BNP: Invalid input(s): "POCBNP" CBG: No results for input(s): "GLUCAP" in the last 168 hours. D-Dimer No results for input(s): "DDIMER" in the last 72 hours. Hgb A1c Recent Labs    11/03/22 0134  HGBA1C 6.0*   Lipid Profile No results for input(s): "CHOL", "HDL", "LDLCALC", "TRIG", "CHOLHDL", "LDLDIRECT" in the last 72 hours. Thyroid function studies No results for input(s): "TSH",  "T4TOTAL", "T3FREE", "THYROIDAB" in the last 72 hours.  Invalid input(s): "FREET3" Anemia work up No results for input(s): "VITAMINB12", "FOLATE", "FERRITIN", "TIBC", "IRON", "RETICCTPCT" in the last 72 hours. Urinalysis    Component Value Date/Time   COLORURINE YELLOW 11/04/2015 1215   APPEARANCEUR HAZY (A) 11/04/2015 1215   LABSPEC >1.030 (H) 11/04/2015 1215   PHURINE 5.5 11/04/2015 1215   GLUCOSEU NEGATIVE 11/04/2015 1215   HGBUR TRACE (A) 11/04/2015 1215   BILIRUBINUR NEGATIVE 11/04/2015 1215   KETONESUR NEGATIVE 11/04/2015 1215   PROTEINUR NEGATIVE 11/04/2015 1215   UROBILINOGEN 0.2 07/25/2012 1708   NITRITE NEGATIVE 11/04/2015 1215   LEUKOCYTESUR SMALL (A) 11/04/2015 1215   Sepsis Labs Recent Labs  Lab 11/02/22 1646 11/03/22 0134  WBC 12.3* 11.7*   Microbiology No results found for this or any previous visit (from the past 240 hour(s)).   Time coordinating discharge: Over 30 minutes  SIGNED:   Alvira Philips [place], DO  [facility] 11/05/2022, 9:46 AM

## 2022-11-05 NOTE — Plan of Care (Signed)

## 2022-11-06 ENCOUNTER — Telehealth: Payer: Self-pay

## 2022-11-06 NOTE — Transitions of Care (Post Inpatient/ED Visit) (Unsigned)
   11/06/2022  Name: [name] MRN: [id number] DOB: 01-Nov-1975  Today's TOC FU Call Status: Today's TOC FU Call Status:: Unsuccessul Call (1st Attempt) Unsuccessful Call (1st Attempt) Date: 11/06/22  Attempted to reach the patient regarding the most recent Inpatient/ED visit.  Follow Up Plan: Additional outreach attempts will be made to reach the patient to complete the Transitions of Care (Post Inpatient/ED visit) call.   Signature Karena Addison, LPN [facility] Nurse Health Advisor Direct Dial [phone]

## 2022-11-10 NOTE — Transitions of Care (Post Inpatient/ED Visit) (Unsigned)
   11/10/2022  Name: [name] MRN: [id number] DOB: 19-Sep-1975  Today's TOC FU Call Status: Today's TOC FU Call Status:: Unsuccessful Call (2nd Attempt) Unsuccessful Call (1st Attempt) Date: 11/06/22 Unsuccessful Call (2nd Attempt) Date: 11/10/22  Attempted to reach the patient regarding the most recent Inpatient/ED visit.  Follow Up Plan: Additional outreach attempts will be made to reach the patient to complete the Transitions of Care (Post Inpatient/ED visit) call.   Signature Karena Addison, LPN [facility] Nurse Health Advisor Direct Dial [phone]

## 2022-11-11 NOTE — Transitions of Care (Post Inpatient/ED Visit) (Signed)
   11/11/2022  Name: [name] MRN: [id number] DOB: 06/13/1976  Today's TOC FU Call Status: Today's TOC FU Call Status:: Unsuccessful Call (3rd Attempt) Unsuccessful Call (1st Attempt) Date: 11/06/22 Unsuccessful Call (2nd Attempt) Date: 11/10/22 Unsuccessful Call (3rd Attempt) Date: 11/11/22  Attempted to reach the patient regarding the most recent Inpatient/ED visit.  Follow Up Plan: No further outreach attempts will be made at this time. We have been unable to contact the patient.  Signature Karena Addison, LPN [facility] Nurse Health Advisor Direct Dial [phone]

## 2022-11-14 ENCOUNTER — Emergency Department (HOSPITAL_COMMUNITY)
Admission: EM | Admit: 2022-11-14 | Discharge: 2022-11-15 | Disposition: A | Discharge status: Home / Self Care | Payer: Medicaid Other | Attending: Emergency Medicine | Admitting: Emergency Medicine

## 2022-11-14 ENCOUNTER — Encounter (HOSPITAL_COMMUNITY): Payer: Self-pay | Admitting: *Deleted

## 2022-11-14 ENCOUNTER — Other Ambulatory Visit: Payer: Self-pay

## 2022-11-14 ENCOUNTER — Emergency Department (HOSPITAL_COMMUNITY): Payer: Medicaid Other

## 2022-11-14 DIAGNOSIS — J45909 Unspecified asthma, uncomplicated: Secondary | ICD-10-CM | POA: Insufficient documentation

## 2022-11-14 DIAGNOSIS — E119 Type 2 diabetes mellitus without complications: Secondary | ICD-10-CM | POA: Diagnosis not present

## 2022-11-14 DIAGNOSIS — I10 Essential (primary) hypertension: Secondary | ICD-10-CM | POA: Diagnosis not present

## 2022-11-14 DIAGNOSIS — Z79899 Other long term (current) drug therapy: Secondary | ICD-10-CM | POA: Diagnosis not present

## 2022-11-14 DIAGNOSIS — M79604 Pain in right leg: Secondary | ICD-10-CM | POA: Diagnosis present

## 2022-11-14 DIAGNOSIS — Z7951 Long term (current) use of inhaled steroids: Secondary | ICD-10-CM | POA: Diagnosis not present

## 2022-11-14 LAB — I-STAT CHEM 8, ED
BUN: 16 mg/dL (ref 6–20)
Calcium, Ion: 1.2 mmol/L (ref 1.15–1.40)
Chloride: 103 mmol/L (ref 98–111)
Creatinine, Ser: 0.8 mg/dL (ref 0.44–1.00)
Glucose, Bld: 97 mg/dL (ref 70–99)
HCT: 39 % (ref 36.0–46.0)
Hemoglobin: 13.3 g/dL (ref 12.0–15.0)
Potassium: 3.9 mmol/L (ref 3.5–5.1)
Sodium: 141 mmol/L (ref 135–145)
TCO2: 29 mmol/L (ref 22–32)

## 2022-11-14 MED ORDER — KETOROLAC TROMETHAMINE 60 MG/2ML IM SOLN
60.0000 mg | Freq: Once | INTRAMUSCULAR | Status: AC
  Administered 2022-11-14: 60 mg via INTRAMUSCULAR
  Filled 2022-11-14: qty 2

## 2022-11-14 NOTE — Discharge Instructions (Addendum)
Please return to the emergency department tomorrow for ultrasound imaging of your right leg.

## 2022-11-14 NOTE — ED Provider Notes (Signed)
[place] EMERGENCY DEPARTMENT AT [facility] Provider Note   CSN: [id number] Arrival date & time: 11/14/22  1947     History  Chief Complaint  Patient presents with   Leg Pain    [name] is a 47 y.o. female.   Leg Pain    47 year old female presenting to the emergency department with right leg cramping and pain with ambulation.  The patient states that she has had intermittent cramping and pain in her right leg "for some time" however she endorsed difficulty ambulating that started today.  She denies any recent falls or trauma to her leg.  She denies any history of DVT or PE.  She is not on anticoagulation.  Pain is worse with attempts at range of motion and movement.  She denies any specific hip pain or knee pain.  No fevers or chills.  Home Medications Prior to Admission medications   Medication Sig Start Date End Date Taking? Authorizing Provider  albuterol (VENTOLIN HFA) 108 (90 Base) MCG/ACT inhaler Inhale 2 puffs into the lungs every 6 (six) hours as needed for wheezing or shortness of breath. 11/05/22   [place], Alvira Philips, DO  budesonide-formoterol (SYMBICORT) 160-4.5 MCG/ACT inhaler Inhale 2 puffs into the lungs 2 (two) times daily. 01/15/21   Elenore Paddy, NP  Dupilumab (DUPIXENT [place]) Inject 1 Syringe into the skin every 14 (fourteen) days.    [provider]  fluconazole (DIFLUCAN) 100 MG tablet Take 1 tablet (100 mg total) by mouth daily for 13 days. 11/05/22 11/18/22  [place], Alvira Philips, DO  losartan (COZAAR) 25 MG tablet Take 25 mg by mouth daily.    [provider]  methylPREDNISolone (MEDROL DOSEPAK) 4 MG TBPK tablet Take 8 tablets (32 mg total) by mouth daily for 3 days, THEN 6 tablets (24 mg total) daily for 3 days, THEN 4 tablets (16 mg total) daily for 3 days, THEN 2 tablets (8 mg total) daily for 3 days. 11/06/22 11/18/22  [place], Alvira Philips, DO  triamterene-hydrochlorothiazide (MAXZIDE-25) 37.5-25 MG tablet Take 1 tablet by mouth daily. 12/04/20    Wilson Singer, MD      Allergies    Bee venom and Prednisone    Review of Systems   Review of Systems  All other systems reviewed and are negative.   Physical Exam Updated Vital Signs BP (!) 168/133   Pulse 84   Temp 98.2 F (36.8 C)   Resp 18   LMP 12/19/2017   SpO2 99%  Physical Exam Vitals and nursing note reviewed.  Constitutional:      General: She is not in acute distress.    Appearance: She is well-developed.  HENT:     Head: Normocephalic and atraumatic.  Eyes:     Conjunctiva/sclera: Conjunctivae normal.  Cardiovascular:     Rate and Rhythm: Normal rate and regular rhythm.  Pulmonary:     Effort: Pulmonary effort is normal. No respiratory distress.     Breath sounds: Normal breath sounds.  Abdominal:     Palpations: Abdomen is soft.     Tenderness: There is no abdominal tenderness.  Musculoskeletal:        General: No swelling.     Cervical back: Neck supple.     Comments: Tenderness to palpation of the musculature of the anterior and posterior thigh, no significant swelling, distally neurovascularly intact.  Skin:    General: Skin is warm and dry.     Capillary Refill: Capillary refill takes less than  2 seconds.  Neurological:     Mental Status: She is alert.  Psychiatric:        Mood and Affect: Mood normal.     ED Results / Procedures / Treatments   Labs (all labs ordered are listed, but only abnormal results are displayed) Labs Reviewed  I-STAT CHEM 8, ED    EKG None  Radiology DG Femur Portable Min 2 Views Right  Result Date: 11/14/2022 CLINICAL DATA:  Leg pain.  No known EXAM: Injury. COMPARISON:  None Available. FINDINGS: There is no evidence of fracture or other focal bone lesions. Soft tissues are unremarkable. IMPRESSION: Negative. Electronically Signed   By: Charlett Nose M.D.   On: 11/14/2022 23:06    Procedures Procedures    Medications Ordered in ED Medications  ketorolac (TORADOL) injection 60 mg (60 mg Intramuscular  Given 11/14/22 2156)    ED Course/ Medical Decision Making/ A&P                             Medical Decision Making Amount and/or Complexity of Data Reviewed Radiology: ordered.  Risk Prescription drug management.    47 year old female presenting to the emergency department with right leg cramping and pain with ambulation.  The patient states that she has had intermittent cramping and pain in her right leg "for some time" however she endorsed difficulty ambulating that started today.  She denies any recent falls or trauma to her leg.  She denies any history of DVT or PE.  She is not on anticoagulation.  Pain is worse with attempts at range of motion and movement.  She denies any specific hip pain or knee pain.  No fevers or chills.  On arrival, the patient was vitally stable.  Presenting with right leg cramping and pain.  Differential diagnosis includes DVT, less likely trauma, muscle strain, electrolyte abnormality.  I-STAT Chem-8 ordered.  X-ray imaging was negative for acute abnormality.  I was informed that ultrasound was not available for DVT study and this was ordered to be done in the morning.  I informed the patient that she would need to follow-up in the morning for ultrasound imaging.  Signout given to Dr. Wilkie Aye pending results of Chem-8.  Patient feeling symptomatically improved following Toradol administration.   Final Clinical Impression(s) / ED Diagnoses Final diagnoses:  Right leg pain    Rx / DC Orders ED Discharge Orders          Ordered    LE VENOUS  Status:  Canceled        11/14/22 2317    US Venous Img Lower Unilateral Right (DVT)        11/14/22 2320              Ernie Avena, MD 11/14/22 2320

## 2022-11-14 NOTE — ED Triage Notes (Signed)
Pt c/o R upper leg pain for a while; difficulty ambulating starting today. Obvious limb and discomfort noted when walking. Denies any injury to leg

## 2022-11-15 ENCOUNTER — Encounter (HOSPITAL_COMMUNITY): Payer: Self-pay

## 2022-11-15 ENCOUNTER — Emergency Department (HOSPITAL_BASED_OUTPATIENT_CLINIC_OR_DEPARTMENT_OTHER): Payer: Medicaid Other

## 2022-11-15 ENCOUNTER — Emergency Department (HOSPITAL_COMMUNITY)
Admission: EM | Admit: 2022-11-15 | Discharge: 2022-11-15 | Disposition: A | Discharge status: Home / Self Care | Payer: Medicaid Other | Source: Home / Self Care | Attending: Emergency Medicine | Admitting: Emergency Medicine

## 2022-11-15 ENCOUNTER — Other Ambulatory Visit: Payer: Self-pay

## 2022-11-15 DIAGNOSIS — M7989 Other specified soft tissue disorders: Secondary | ICD-10-CM | POA: Diagnosis not present

## 2022-11-15 DIAGNOSIS — Z79899 Other long term (current) drug therapy: Secondary | ICD-10-CM | POA: Insufficient documentation

## 2022-11-15 DIAGNOSIS — E119 Type 2 diabetes mellitus without complications: Secondary | ICD-10-CM | POA: Insufficient documentation

## 2022-11-15 DIAGNOSIS — M79604 Pain in right leg: Secondary | ICD-10-CM

## 2022-11-15 DIAGNOSIS — J45909 Unspecified asthma, uncomplicated: Secondary | ICD-10-CM | POA: Insufficient documentation

## 2022-11-15 DIAGNOSIS — I10 Essential (primary) hypertension: Secondary | ICD-10-CM | POA: Insufficient documentation

## 2022-11-15 DIAGNOSIS — Z7951 Long term (current) use of inhaled steroids: Secondary | ICD-10-CM | POA: Insufficient documentation

## 2022-11-15 MED ORDER — NAPROXEN 500 MG PO TABS: 500.0000 mg | ORAL_TABLET | Freq: Two times a day (BID) | ORAL | 0 refills | Status: DC

## 2022-11-15 MED ORDER — ENOXAPARIN SODIUM 150 MG/ML IJ SOSY
1.0000 mg/kg | PREFILLED_SYRINGE | Freq: Once | INTRAMUSCULAR | Status: AC
  Administered 2022-11-15: 129 mg via SUBCUTANEOUS
  Filled 2022-11-15: qty 0.86

## 2022-11-15 MED ORDER — ACETAMINOPHEN 500 MG PO TABS: 500.0000 mg | ORAL_TABLET | Freq: Four times a day (QID) | ORAL | 0 refills | Status: DC | PRN

## 2022-11-15 MED ORDER — HYDROCODONE-ACETAMINOPHEN 5-325 MG PO TABS
1.0000 | ORAL_TABLET | Freq: Once | ORAL | Status: AC
  Administered 2022-11-15: 1 via ORAL
  Filled 2022-11-15 (×2): qty 1

## 2022-11-15 NOTE — ED Provider Notes (Signed)
[place] EMERGENCY DEPARTMENT AT [facility] Provider Note   CSN: [id number] Arrival date & time: 11/15/22  1010     History  Chief Complaint  Patient presents with   Leg Pain    [name] is a 47 y.o. female with a past medical history of asthma, hypertension, type 2 diabetes and obesity presenting today with leg pain.  She was seen yesterday for this however vascular was not present to perform a DVT study.  She is representing for this today   Leg Pain      Home Medications Prior to Admission medications   Medication Sig Start Date End Date Taking? Authorizing Provider  albuterol (VENTOLIN HFA) 108 (90 Base) MCG/ACT inhaler Inhale 2 puffs into the lungs every 6 (six) hours as needed for wheezing or shortness of breath. 11/05/22   [place], Alvira Philips, DO  budesonide-formoterol (SYMBICORT) 160-4.5 MCG/ACT inhaler Inhale 2 puffs into the lungs 2 (two) times daily. 01/15/21   Elenore Paddy, NP  Dupilumab (DUPIXENT [place]) Inject 1 Syringe into the skin every 14 (fourteen) days.    [provider]  fluconazole (DIFLUCAN) 100 MG tablet Take 1 tablet (100 mg total) by mouth daily for 13 days. 11/05/22 11/18/22  [place], Alvira Philips, DO  losartan (COZAAR) 25 MG tablet Take 25 mg by mouth daily.    [provider]  methylPREDNISolone (MEDROL DOSEPAK) 4 MG TBPK tablet Take 8 tablets (32 mg total) by mouth daily for 3 days, THEN 6 tablets (24 mg total) daily for 3 days, THEN 4 tablets (16 mg total) daily for 3 days, THEN 2 tablets (8 mg total) daily for 3 days. 11/06/22 11/18/22  [place], Alvira Philips, DO  triamterene-hydrochlorothiazide (MAXZIDE-25) 37.5-25 MG tablet Take 1 tablet by mouth daily. 12/04/20   Wilson Singer, MD      Allergies    Bee venom and Prednisone    Review of Systems   Review of Systems  Physical Exam Updated Vital Signs BP (!) 165/122   Pulse 84   Temp 98.3 F (36.8 C) (Oral)   Resp 16   Ht 5\' 2"  (1.575 m)   Wt 118.8 kg   LMP 12/19/2017    SpO2 97%   BMI 47.92 kg/m  Physical Exam Vitals and nursing note reviewed.  Constitutional:      Appearance: Normal appearance.  HENT:     Head: Normocephalic and atraumatic.  Eyes:     General: No scleral icterus.    Conjunctiva/sclera: Conjunctivae normal.  Pulmonary:     Effort: Pulmonary effort is normal. No respiratory distress.  Musculoskeletal:     Comments: Full range of motion of right lower extremity however some pain with knee flexion.  Strong DP pulse, normal plantarflexion strength.  No overlying cellulitis or noticeable leg swelling  Skin:    Findings: No rash.  Neurological:     Mental Status: She is alert.  Psychiatric:        Mood and Affect: Mood normal.     ED Results / Procedures / Treatments   Labs (all labs ordered are listed, but only abnormal results are displayed) Labs Reviewed - No data to display  EKG None  Radiology DG Femur Portable Min 2 Views Right  Result Date: 11/14/2022 CLINICAL DATA:  Leg pain.  No known EXAM: Injury. COMPARISON:  None Available. FINDINGS: There is no evidence of fracture or other focal bone lesions. Soft tissues are unremarkable. IMPRESSION: Negative. Electronically Signed   By: Caryn Bee  Dover M.D.   On: 11/14/2022 23:06    Procedures Procedures    Medications Ordered in ED Medications - No data to display  ED Course/ Medical Decision Making/ A&P                             Medical Decision Making Risk OTC drugs. Prescription drug management.     46 year old female presenting today due to the concern for DVT.  Patient was to be scheduled for outpatient DVT study but unfortunately was instructed to return to the emergency department.  I did order the DVT study which was negative.  Physical exam revealing of a neurovascularly intact lower extremity.  No swelling.  No cellulitis or clear injuries.  At this time patient will require follow-up with orthopedics.  Given knee immobilizer and crutches.  She was  treated with Vicodin while she waited for DVT which helped her.  She had not tried anything over-the-counter for her discomfort so I prescribed Naprosyn and Tylenol.  She was informed she could also buy these over-the-counter.  Orthopedic referral placed and patient discharged in stable condition Final Clinical Impression(s) / ED Diagnoses Final diagnoses:  Right leg pain    Rx / DC Orders ED Discharge Orders     None      Results and diagnoses were explained to the patient. Return precautions discussed in full. Patient had no additional questions and expressed complete understanding.   This chart was dictated using voice recognition software.  Despite best efforts to proofread,  errors can occur which can change the documentation meaning.     Woodroe Chen 11/15/22 1250    Bethann Berkshire, MD 11/18/22 1550

## 2022-11-15 NOTE — Progress Notes (Signed)
VASCULAR LAB    Right lower extremity venous duplex has been performed.  See CV proc for preliminary results.  Messaged negative results to [employer], PA-C   Trystan Eads, RVT 11/15/2022, 12:21 PM

## 2022-11-15 NOTE — Discharge Instructions (Addendum)
He returned to the emergency department today for an ultrasound.  Your ultrasound was negative for any blood clots.  I would like you to do Naprosyn in the morning and at night and throughout the day exercise Tylenol.  When you are able to stay seated please keep your leg elevated with some ice packs on top.  Call and make an appointment with the orthopedic physician on these discharge papers.  It was a pleasure to meet you and I hope you feel better!

## 2022-11-15 NOTE — ED Triage Notes (Signed)
Was seen last night and was told to come back for an ultrasound on her right leg to rule out a blood clot. Has never had one before. Painful to walk.

## 2022-11-15 NOTE — ED Provider Notes (Signed)
Patient signed out pending lab work.  In brief presents with acute on chronic right leg pain.  Describes cramping and pain in the right leg above the knee.  No injury.  Chem-8 is negative.  No significant metabolic derangements.  No history of DVT or PE.  X-rays negative.  Patient was given 1 mg/kg of Lovenox and will return later for DVT study.   Shon Baton, MD 11/15/22 [phone]

## 2022-11-18 ENCOUNTER — Other Ambulatory Visit: Payer: Self-pay | Admitting: Nurse Practitioner

## 2022-11-18 DIAGNOSIS — Z1231 Encounter for screening mammogram for malignant neoplasm of breast: Secondary | ICD-10-CM

## 2022-11-26 ENCOUNTER — Ambulatory Visit (HOSPITAL_BASED_OUTPATIENT_CLINIC_OR_DEPARTMENT_OTHER): Payer: Medicare HMO | Admitting: Orthopaedic Surgery

## 2022-11-26 ENCOUNTER — Encounter: Payer: Self-pay | Admitting: Physical Therapy

## 2022-11-26 ENCOUNTER — Ambulatory Visit: Payer: Medicare Other | Attending: Internal Medicine | Admitting: Physical Therapy

## 2022-11-26 DIAGNOSIS — M6281 Muscle weakness (generalized): Secondary | ICD-10-CM | POA: Diagnosis present

## 2022-11-26 DIAGNOSIS — M79604 Pain in right leg: Secondary | ICD-10-CM | POA: Diagnosis present

## 2022-11-26 NOTE — Therapy (Addendum)
OUTPATIENT PHYSICAL THERAPY EVALUATION   Patient Name: [name] MRN: [id number] DOB:[birth year], 47 y.o., female Today's Date: 11/26/2022   END OF SESSION:  PT End of Session - 11/26/22 1026     Visit Number 1    Number of Visits 7    Date for PT Re-Evaluation 01/07/23    Authorization Type [employer] MCR    PT Start Time 1017    PT Stop Time 1100    PT Time Calculation (min) 43 min    Activity Tolerance Patient tolerated treatment well    Behavior During Therapy WFL for tasks assessed/performed             Past Medical History:  Diagnosis Date   Anemia    Asthma    Blood transfusion without reported diagnosis    Environmental allergies    Fibroid, uterine    Fibroids    GERD (gastroesophageal reflux disease)    Hypertension    no meds   Menorrhagia    Seasonal allergies    chronic   Shortness of breath    Sleep apnea    does not use CPAP   Past Surgical History:  Procedure Laterality Date   ABDOMINAL HYSTERECTOMY Bilateral 02/08/2018   Procedure: HYSTERECTOMY ABDOMINAL WITH LEFT SALPINGO-OOPORECTOMY AND RIGHT SALPINGECTOMY;  Surgeon: Willodean Rosenthal, MD;  Location: [facility] ORS;  Service: Gynecology;  Laterality: Bilateral;   CHOLECYSTECTOMY     06/29/2011   CHOLECYSTECTOMY  06/29/2011   Procedure: LAPAROSCOPIC CHOLECYSTECTOMY WITH INTRAOPERATIVE CHOLANGIOGRAM;  Surgeon: Robyne Askew, MD;  Location: [facility] OR;  Service: General;  Laterality: N/A;   ECTOPIC PREGNANCY SURGERY     Laparoscopic   ERCP  06/30/2011   Procedure: ENDOSCOPIC RETROGRADE CHOLANGIOPANCREATOGRAPHY (ERCP);  Surgeon: Petra Kuba, MD;  Location: [facility] ENDOSCOPY;  Service: Endoscopy;  Laterality: N/A;  probable sphincterotomy   ERCP  07/03/2011   Procedure: ENDOSCOPIC RETROGRADE CHOLANGIOPANCREATOGRAPHY (ERCP);  Surgeon: Freddy Jaksch, MD;  Location: [facility] OR;  Service: Endoscopy;  Laterality: N/A;  PROPOFOL   MYOMECTOMY  2014   MYOMECTOMY N/A 11/14/2012   Procedure: MYOMECTOMY;  Surgeon:  Tereso Newcomer, MD;  Location: [facility] ORS;  Service: Gynecology;  Laterality: N/A;   NASAL POLYP EXCISION  2016   in [facility]   WISDOM TOOTH EXTRACTION     Patient Active Problem List   Diagnosis Date Noted   Acute asthma exacerbation 11/03/2022   Asthma exacerbation attacks 11/02/2022   Hyperglycemia 11/02/2022   GERD (gastroesophageal reflux disease) 11/02/2022   Tachycardia 11/02/2022   Hyperlipidemia 01/02/2021   Prediabetes 01/02/2021   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 [facility]) 01/02/2021   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   Complex cyst of left ovary 02/09/2018   Post-operative state 02/08/2018   Non compliance w medication regimen 12/08/2017   Perennial and seasonal allergic rhinitis 02/22/2017   Severe persistent asthma 02/22/2017   Polyp of nasal cavity 02/22/2017   Leukocytosis 10/15/2016   Iron deficiency anemia due to chronic blood loss    Symptomatic anemia    Asthma exacerbation 10/11/2016   Hypokalemia 10/11/2016   Acute asthma 04/22/2016   Acute sinusitis, unspecified 03/26/2014   Essential hypertension 12/08/2013   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   Anemia due to menorrhagia and fibroids 11/11/2012   Fibroids 08/18/2012   Menorrhagia 08/18/2012   Extrinsic asthma 08/15/2011    PCP: Diamantina Providence, FNP  REFERRING PROVIDER: Jola Schmidt, PA-C  REFERRING DIAG: Myalgia, unspecified site  THERAPY DIAG:  Pain in right leg  Muscle weakness (generalized)  Rationale for Evaluation and Treatment: Rehabilitation  ONSET DATE: 11/14/2022   SUBJECTIVE:  SUBJECTIVE STATEMENT: Patient reports she hasn't been able to move her right leg, she has been to the hospital because her leg was hurting so bad and they did a dopler and imaging which was negative. She states the doctor finally figured it was the new blood pressure pill she was on. Currently she is able to stand and walk  better now but still with pain, which is better than a few weeks ago where she could barely walk. She reports numbness in the lower leg just below the knee and then cramping pain in the front of the thigh. Pain is worse with movement and standing. She also is staying with her mom currently because she is unable to go up a lot of steps which she has at home. She stands at work so is currently out of work.  PERTINENT HISTORY: See PMH above  PAIN:  Are you having pain? Yes:  NPRS scale: 8/10 Pain location: Right leg Pain description: Cramping, numb Aggravating factors: Movement of the right leg, standing, walking Relieving factors: Rest, propping up the leg  PRECAUTIONS: None  WEIGHT BEARING RESTRICTIONS: No  FALLS:  Has patient fallen in last 6 months? No  OCCUPATION: Standing  PLOF: Independent  PATIENT GOALS: Pain relief and improve standing/walking   OBJECTIVE:  PATIENT SURVEYS:  FOTO 12 % functional status  COGNITION: Overall cognitive status: Within functional limits for tasks assessed     SENSATION: WFL  MUSCLE LENGTH: Unable to accurately assess secondary to pain  POSTURE:   Patient exhibits reduced weight placed through right leg secondary to pain  PALPATION: Tender to palpation right mid anterior thigh  LOWER EXTREMITY MMT:  MMT Right eval Left eval  Hip flexion 3 4  Hip extension    Hip abduction    Hip adduction    Hip internal rotation    Hip external rotation    Knee flexion 4- 5  Knee extension 3 5  Ankle dorsiflexion 4 5  Ankle plantarflexion    Ankle inversion    Ankle eversion     (Blank rows = not tested)  LOWER EXTREMITY ROM:  AROM Right eval Left eval  Hip flexion    Hip extension    Hip abduction    Hip adduction    Hip internal rotation    Hip external rotation    Knee flexion 110 115  Knee extension 0 0  Ankle dorsiflexion    Ankle plantarflexion    Ankle inversion    Ankle eversion     (Blank rows = not  tested)  FUNCTIONAL TESTS:  Not assessed  GAIT: Assistive device utilized: None Level of assistance: Complete Independence Comments: Antalgic on right   TODAY'S TREATMENT:    [facility] Adult PT Treatment:                                                DATE: 11/26/2022 Therapeutic Exercise: Instruction on using a roller to the anterior thigh Modified thomas stretch edge of mat table Quad set LAQ Seated heel-toe raises  PATIENT EDUCATION:  Education details: Exam findings, POC, HEP Person educated: Patient Education method: Explanation, Demonstration, Tactile cues, Verbal cues, and Handouts Education  comprehension: verbalized understanding, returned demonstration, verbal cues required, tactile cues required, and needs further education  HOME EXERCISE PROGRAM: Access Code: [email or web address]    ASSESSMENT: CLINICAL IMPRESSION: Patient is a 47 y.o. female who was seen today for physical therapy evaluation and treatment for right leg pain and numbness with no apparent mechanism, but her doctor believes it is due to her blood pressure medication. Evaluation this visit limited due to patient's report of pain. She demonstrates gross limitations in her right leg strength, motion, and ability to stand/walk secondary to pain in the anterior thigh. Unclear exact etiology of symptoms, unlikely to be muscular strain, possibility of radiculopathic pain given symptoms but not assessed this visit due to pain and limitation during evaluation.   OBJECTIVE IMPAIRMENTS: Abnormal gait, decreased activity tolerance, difficulty walking, decreased ROM, decreased strength, impaired flexibility, and pain.   ACTIVITY LIMITATIONS: lifting, standing, squatting, stairs, dressing, and locomotion level  PARTICIPATION LIMITATIONS: meal prep, cleaning, laundry, shopping, community activity, and occupation  PERSONAL FACTORS: Fitness and Past/current experiences are also affecting patient's functional outcome.   REHAB  POTENTIAL: Good  CLINICAL DECISION MAKING: Stable/uncomplicated  EVALUATION COMPLEXITY: Low   GOALS: Goals reviewed with patient? Yes  SHORT TERM GOALS: Target date: 12/17/2022  Patient will be I with initial HEP in order to progress with therapy. Baseline: HEP provided at eval Goal status: INITIAL  2.  Patient will report right leg pain </= 5/10 in order to reduce functional limitations Baseline: 8/10 Goal status: INITIAL  LONG TERM GOALS: Target date: 01/07/2023  Patient will be I with final HEP to maintain progress from PT. Baseline: HEP provided at eval Goal status: INITIAL  2.  Patient will report >/= 50% status on FOTO to indicate improved functional ability. Baseline: 12% functional status Goal status: INITIAL  3.  Patient will demonstrate right leg strength 5/5 MMT in order to improve stair negotiation Baseline: grossly 3/5 MMT, limited secondary to pain Goal status: INITIAL  4.  Patient will be able to standing >/= 1 hour to improve ability to perform work related tasks Baseline: able to stand short periods due to pain Goal status: INITIAL   PLAN: PT FREQUENCY: 1x/week  PT DURATION: 6 weeks  PLANNED INTERVENTIONS: Therapeutic exercises, Therapeutic activity, Neuromuscular re-education, Balance training, Gait training, Patient/Family education, Self Care, Joint mobilization, Aquatic Therapy, Dry Needling, Electrical stimulation, Cryotherapy, Moist heat, Taping, Manual therapy, and Re-evaluation  PLAN FOR NEXT SESSION: Review HEP and progress PRN, gentle manual and stretch for the right quad and leg, progress quad and right leg strengthening as able, progress to closed chain standing exercises as able   Rosana Hoes, PT, DPT, LAT, ATC 11/26/22  11:23 AM Phone: [phone] Fax: [phone]    Referring diagnosis? M79.10  Treatment diagnosis? (if different than referring diagnosis) M79.604 What was this (referring dx) caused by? []  Surgery []   Fall [x]  Ongoing issue []  Arthritis []  Other: ____________  Laterality: [x]  Rt []  Lt []  Both  Check all possible CPT codes:  *CHOOSE 10 OR LESS*    []  97110 (Therapeutic Exercise)  []  92507 (SLP Treatment)  []  97112 (Neuro Re-ed)   []  92526 (Swallowing Treatment)   []  97116 (Gait Training)   []  [id number] (Cognitive Training, 1st 15 minutes) []  97140 (Manual Therapy)   []  97130 (Cognitive Training, each add'l 15 minutes)  []  97164 (Re-evaluation)                              []   Other, List CPT Code ____________  []  97530 (Therapeutic Activities)     []  97535 (Self Care)   [x]  All codes above (97110 - 97535)  []  97012 (Mechanical Traction)  [x]  97014 (E-stim Unattended)  []  97032 (E-stim manual)  []  97033 (Ionto)  []  97035 (Ultrasound) []  97750 (Physical Performance Training) [x]  [id number] (Aquatic Therapy) []  97016 (Vasopneumatic Device) []  [id number] (Paraffin) []  97034 (Contrast Bath) []  97597 (Wound Care 1st 20 sq cm) []  97598 (Wound Care each add'l 20 sq cm) []  97760 (Orthotic Fabrication, Fitting, Training Initial) []  [id number] (Prosthetic Management and Training Initial) []  [id number] (Orthotic or Prosthetic Training/ Modification Subsequent)

## 2022-11-26 NOTE — Patient Instructions (Signed)
Access Code: [email or web address] URL: [email or web address] Date: 11/26/2022 Prepared by: Rosana Hoes  Exercises - Modified Maisie Fus Stretch  - 3 x daily - 30-60 seconds hold - Supine Quadricep Sets  - 3 x daily - 10 reps - 5 seconds hold - Seated Long Arc Quad  - 3 x daily - 5-10 reps - Seated Heel Toe Raises  - 3 x daily - 10-20 reps

## 2022-11-27 ENCOUNTER — Ambulatory Visit: Payer: Medicare HMO

## 2022-11-30 NOTE — Therapy (Signed)
OUTPATIENT PHYSICAL THERAPY TREATMENT NOTE   Patient Name: [name] MRN: [id number] DOB:1975-12-16, 47 y.o., female Today's Date: 12/01/2022  PCP: Diamantina Providence, FNP REFERRING PROVIDER: Jola Schmidt, PA-C   END OF SESSION:   PT End of Session - 12/01/22 1102     Visit Number 2    Number of Visits 7    Date for PT Re-Evaluation 01/07/23    Authorization Type [employer] MCR    Authorization Time Period 12/01/2022 - 01/16/2023    Authorization - Visit Number 1    Authorization - Number of Visits 12    PT Start Time 1102    PT Stop Time 1145    PT Time Calculation (min) 43 min    Activity Tolerance Patient tolerated treatment well    Behavior During Therapy WFL for tasks assessed/performed             Past Medical History:  Diagnosis Date   Anemia    Asthma    Blood transfusion without reported diagnosis    Environmental allergies    Fibroid, uterine    Fibroids    GERD (gastroesophageal reflux disease)    Hypertension    no meds   Menorrhagia    Seasonal allergies    chronic   Shortness of breath    Sleep apnea    does not use CPAP   Past Surgical History:  Procedure Laterality Date   ABDOMINAL HYSTERECTOMY Bilateral 02/08/2018   Procedure: HYSTERECTOMY ABDOMINAL WITH LEFT SALPINGO-OOPORECTOMY AND RIGHT SALPINGECTOMY;  Surgeon: Willodean Rosenthal, MD;  Location: [facility];  Service: Gynecology;  Laterality: Bilateral;   CHOLECYSTECTOMY     06/29/2011   CHOLECYSTECTOMY  06/29/2011   Procedure: LAPAROSCOPIC CHOLECYSTECTOMY WITH INTRAOPERATIVE CHOLANGIOGRAM;  Surgeon: Robyne Askew, MD;  Location: [facility] OR;  Service: General;  Laterality: N/A;   ECTOPIC PREGNANCY SURGERY     Laparoscopic   ERCP  06/30/2011   Procedure: ENDOSCOPIC RETROGRADE CHOLANGIOPANCREATOGRAPHY (ERCP);  Surgeon: Petra Kuba, MD;  Location[facility] END[facility]COPY;  Service: Endoscopy;  Laterality: N/A;  probable sphincterotomy   ERCP  07/03/2011   Procedure: ENDOSCOPIC RETROGRADE  CHOLANGIOPANCREATOGRAPHY (ERCP);  Surgeon: Freddy Jaksch, MD;  Location: [facility] OR;  Service: Endoscopy;  Laterality: N/A;  PROPOFOL   MYOMECTOMY  2014   MYOMECTOMY N/A 11/14/2012   Procedure: MYOMECTOMY;  Surgeon: Tereso Newcomer, MD;  Location: [facility];  Service: Gynecology;  Laterality: N/A;   NASAL POLYP EXCISION  2016   in [facility]   WISDOM TOOTH EXTRACTION     Patient Active Problem List   Diagnosis Date Noted   Acute asthma exacerbation 11/03/2022   Asthma exacerbation attacks 11/02/2022   Hyperglycemia 11/02/2022   GERD (gastroesophageal reflux disease) 11/02/2022   Tachycardia 11/02/2022   Hyperlipidemia 01/02/2021   Prediabetes 01/02/2021   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 [facility]) 01/02/2021   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   Complex cyst of left ovary 02/09/2018   Post-operative state 02/08/2018   Non compliance w medication regimen 12/08/2017   Perennial and seasonal allergic rhinitis 02/22/2017   Severe persistent asthma 02/22/2017   Polyp of nasal cavity 02/22/2017   Leukocytosis 10/15/2016   Iron deficiency anemia due to chronic blood loss    Symptomatic anemia    Asthma exacerbation 10/11/2016   Hypokalemia 10/11/2016   Acute asthma 04/22/2016   Acute sinusitis, unspecified 03/26/2014   Essential hypertension 12/08/2013   Steroid-induced diabetes (  HCC) 09/26/2013   Anemia due to menorrhagia and fibroids 11/11/2012   Fibroids 08/18/2012   Menorrhagia 08/18/2012   Extrinsic asthma 08/15/2011    REFERRING DIAG: Myalgia, unspecified site   THERAPY DIAG:  Pain in right leg  Muscle weakness (generalized)  Rationale for Evaluation and Treatment Rehabilitation  PERTINENT HISTORY: See PMH above   PRECAUTIONS: None    SUBJECTIVE:                                                                                                                                                                                      SUBJECTIVE STATEMENT:  She reports continued right thigh pain and the numbness she was feeling in the front of the upper shin is getting better. She has been working on the exercises as much as she can but they are painful.   PAIN:  Are you having pain? Yes:  NPRS scale: 8/10 Pain location: Right leg Pain description: Cramping, numb Aggravating factors: Movement of the right leg, standing, walking Relieving factors: Rest, propping up the leg   OBJECTIVE: (objective measures completed at initial evaluation unless otherwise dated) PATIENT SURVEYS:  FOTO 12 % functional status      SENSATION: WFL   MUSCLE LENGTH: Unable to accurately assess secondary to pain   POSTURE:             Patient exhibits reduced weight placed through right leg secondary to pain   PALPATION: Tender to palpation right mid anterior thigh   LOWER EXTREMITY MMT:   MMT Right eval Left eval Right 12/01/22  Hip flexion 3 4 3   Hip extension       Hip abduction       Hip adduction       Hip internal rotation       Hip external rotation       Knee flexion 4- 5   Knee extension 3 5 3   Ankle dorsiflexion 4 5   Ankle plantarflexion       Ankle inversion       Ankle eversion        (Blank rows = not tested)   LOWER EXTREMITY ROM:   AROM Right eval Left eval  Hip flexion      Hip extension      Hip abduction      Hip adduction      Hip internal rotation      Hip external rotation      Knee flexion 110 115  Knee extension 0 0  Ankle dorsiflexion      Ankle plantarflexion      Ankle inversion      Ankle eversion       (  Blank rows = not tested)   FUNCTIONAL TESTS:  Not assessed   GAIT: Assistive device utilized: None Level of assistance: Complete Independence Comments: Antalgic on right     TODAY'S TREATMENT:    [facility] Adult PT Treatment:                                                DATE: 12/01/2022 Therapeutic Exercise: NuStep L3 x 8 min with UE/LE to work on  right LE movement Modified thomas stretch edge of mat table x 3 min Hooklying adductor ball squeeze x 10 Hooklying clamshell with yellow x 10 Quad set with small ball under knee x 10 LAQ partial range x 10 Seated heel-toe raises x 20 Seated hip march x 10 Standing TKE with yellow x 10   [facility] Adult PT Treatment:                                                DATE: 11/26/2022 Therapeutic Exercise: Instruction on using a roller to the anterior thigh Modified thomas stretch edge of mat table Quad set LAQ Seated heel-toe raises   PATIENT EDUCATION:  Education details: HEP update Person educated: Patient Education method: Explanation, Demonstration, Tactile cues, Verbal cues, Handout Education comprehension: verbalized understanding, returned demonstration, verbal cues required, tactile cues required, and needs further education   HOME EXERCISE PROGRAM: Access Code: [email or web address]      ASSESSMENT: CLINICAL IMPRESSION: Patient tolerated therapy well with no adverse effects. Therapy focused on progressing active right hip and knee motion, quad strengthening as tolerated. She was able to tolerate some light resistance and standing exercises this visit but does continue to exhibit increased pain and weakness with right leg activity. She requires extended rest breaks between exercises due to pain. Updated her HEP to progress some hip strengthening for home. Patient would benefit from continued skilled PT to progress her mobility and strength in order to reduce pain and maximize functional ability.     OBJECTIVE IMPAIRMENTS: Abnormal gait, decreased activity tolerance, difficulty walking, decreased ROM, decreased strength, impaired flexibility, and pain.    ACTIVITY LIMITATIONS: lifting, standing, squatting, stairs, dressing, and locomotion level   PARTICIPATION LIMITATIONS: meal prep, cleaning, laundry, shopping, community activity, and occupation   PERSONAL FACTORS: Fitness and Past/current  experiences are also affecting patient's functional outcome.      GOALS: Goals reviewed with patient? Yes   SHORT TERM GOALS: Target date: 12/17/2022   Patient will be I with initial HEP in order to progress with therapy. Baseline: HEP provided at eval Goal status: INITIAL   2.  Patient will report right leg pain </= 5/10 in order to reduce functional limitations Baseline: 8/10 Goal status: INITIAL   LONG TERM GOALS: Target date: 01/07/2023   Patient will be I with final HEP to maintain progress from PT. Baseline: HEP provided at eval Goal status: INITIAL   2.  Patient will report >/= 50% status on FOTO to indicate improved functional ability. Baseline: 12% functional status Goal status: INITIAL   3.  Patient will demonstrate right leg strength 5/5 MMT in order to improve stair negotiation Baseline: grossly 3/5 MMT, limited secondary to pain Goal status: INITIAL   4.  Patient will be able to standing >/=  1 hour to improve ability to perform work related tasks Baseline: able to stand short periods due to pain Goal status: INITIAL     PLAN: PT FREQUENCY: 1x/week   PT DURATION: 6 weeks   PLANNED INTERVENTIONS: Therapeutic exercises, Therapeutic activity, Neuromuscular re-education, Balance training, Gait training, Patient/Family education, Self Care, Joint mobilization, Aquatic Therapy, Dry Needling, Electrical stimulation, Cryotherapy, Moist heat, Taping, Manual therapy, and Re-evaluation   PLAN FOR NEXT SESSION: Review HEP and progress PRN, gentle manual and stretch for the right quad and leg, progress quad and right leg strengthening as able, progress to closed chain standing exercises as able   Rosana Hoes, PT, DPT, LAT, ATC 12/01/22  11:51 AM Phone: [phone] Fax: [phone]

## 2022-12-01 ENCOUNTER — Other Ambulatory Visit: Payer: Self-pay

## 2022-12-01 ENCOUNTER — Ambulatory Visit: Payer: Medicare HMO

## 2022-12-01 ENCOUNTER — Encounter: Payer: Self-pay | Admitting: Physical Therapy

## 2022-12-01 ENCOUNTER — Telehealth: Payer: Self-pay | Admitting: Physical Therapy

## 2022-12-01 ENCOUNTER — Ambulatory Visit: Payer: Medicare Other | Admitting: Physical Therapy

## 2022-12-01 DIAGNOSIS — M6281 Muscle weakness (generalized): Secondary | ICD-10-CM

## 2022-12-01 DIAGNOSIS — M79604 Pain in right leg: Secondary | ICD-10-CM

## 2022-12-01 NOTE — Patient Instructions (Signed)
Access Code: [email or web address] URL: [email or web address] Date: 12/01/2022 Prepared by: Rosana Hoes  Exercises - Modified Maisie Fus Stretch  - 3 x daily - 30-60 seconds hold - Supine Quadricep Sets  - 3 x daily - 10 reps - 5 seconds hold - Hooklying Clamshell with Resistance  - 3 x daily - 10 reps - Seated Long Arc Quad  - 3 x daily - 5-10 reps - Seated Heel Toe Raises  - 3 x daily - 10-20 reps

## 2022-12-01 NOTE — Addendum Note (Signed)
Addended by: Hilbert Bible on: 12/01/2022 11:56 AM   Modules accepted: Orders

## 2022-12-01 NOTE — Telephone Encounter (Signed)
Patient called request records to be sent to Dr Dareen Piano, sent email with medical release. That can be returned to [email or web address] to have records release.  States the referring provider office did not receive POC and request it to be refaxed.

## 2022-12-02 ENCOUNTER — Ambulatory Visit (HOSPITAL_BASED_OUTPATIENT_CLINIC_OR_DEPARTMENT_OTHER): Payer: Medicare HMO | Admitting: Orthopaedic Surgery

## 2022-12-08 ENCOUNTER — Ambulatory Visit: Payer: Medicare Other | Admitting: Physical Therapy

## 2022-12-08 ENCOUNTER — Other Ambulatory Visit: Payer: Self-pay

## 2022-12-08 ENCOUNTER — Encounter: Payer: Self-pay | Admitting: Physical Therapy

## 2022-12-08 DIAGNOSIS — M79604 Pain in right leg: Secondary | ICD-10-CM | POA: Diagnosis not present

## 2022-12-08 DIAGNOSIS — M6281 Muscle weakness (generalized): Secondary | ICD-10-CM

## 2022-12-08 NOTE — Therapy (Signed)
OUTPATIENT PHYSICAL THERAPY TREATMENT NOTE   Patient Name: [name] MRN: [id number] DOB:09/19/1975, 47 y.o.,, female Today's Date: 12/08/2022  PCP: Diamantina Providence, FNP REFERRING PROVIDER: Jola Schmidt, PA-C   END OF SESSION:   PT End of Session - 12/08/22 1102     Visit Number 3    Number of Visits 7    Date for PT Re-Evaluation 01/07/23    Authorization Type [employer] MCR    Authorization Time Period 12/01/2022 - 01/16/2023    Authorization - Visit Number 2    Authorization - Number of Visits 12    PT Start Time 1102    PT Stop Time 1145    PT Time Calculation (min) 43 min    Activity Tolerance Patient tolerated treatment well    Behavior During Therapy WFL for tasks assessed/performed              Past Medical History:  Diagnosis Date   Anemia    Asthma    Blood transfusion without reported diagnosis    Environmental allergies    Fibroid, uterine    Fibroids    GERD (gastroesophageal reflux disease)    Hypertension    no meds   Menorrhagia    Seasonal allergies    chronic   Shortness of breath    Sleep apnea    does not use CPAP   Past Surgical History:  Procedure Laterality Date   ABDOMINAL HYSTERECTOMY Bilateral 02/08/2018   Procedure: HYSTERECTOMY ABDOMINAL WITH LEFT SALPINGO-OOPORECTOMY AND RIGHT SALPINGECTOMY;  Surgeon: Willodean Rosenthal, MD;  Location: [facility];  Service: Gynecology;  Laterality: Bilateral;   CHOLECYSTECTOMY     06/29/2011   CHOLECYSTECTOMY  06/29/2011   Procedure: LAPAROSCOPIC CHOLECYSTECTOMY WITH INTRAOPERATIVE CHOLANGIOGRAM;  Surgeon: Robyne Askew, MD;  Location: [facility] OR;  Service: General;  Laterality: N/A;   ECTOPIC PREGNANCY SURGERY     Laparoscopic   ERCP  06/30/2011   Procedure: ENDOSCOPIC RETROGRADE CHOLANGIOPANCREATOGRAPHY (ERCP);  Surgeon: Petra Kuba, MD;  Location: [facility] ENDOSCOPY;  Service: Endoscopy;  Laterality: N/A;  probable sphincterotomy   ERCP  07/03/2011   Procedure: ENDOSCOPIC RETROGRADE  CHOLANGIOPANCREATOGRAPHY (ERCP);  Surgeon: Freddy Jaksch, MD;  Location: [facility] OR;  Service: Endoscopy;  Laterality: N/A;  PROPOFOL   MYOMECTOMY  2014   MYOMECTOMY N/A 11/14/2012   Procedure: MYOMECTOMY;  Surgeon: Tereso Newcomer, MD;  Location: [facility];  Service: Gynecology;  Laterality: N/A;   NASAL POLYP EXCISION  2016   in [facility]   WISDOM TOOTH EXTRACTION     Patient Active Problem List   Diagnosis Date Noted   Acute asthma exacerbation 11/03/2022   Asthma exacerbation attacks 11/02/2022   Hyperglycemia 11/02/2022   GERD (gastroesophageal reflux disease) 11/02/2022   Tachycardia 11/02/2022   Hyperlipidemia 01/02/2021   Prediabetes 01/02/2021   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 [facility]) 01/02/2021   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   Complex cyst of left ovary 02/09/2018   Post-operative state 02/08/2018   Non compliance w medication regimen 12/08/2017   Perennial and seasonal allergic rhinitis 02/22/2017   Severe persistent asthma 02/22/2017   Polyp of nasal cavity 02/22/2017   Leukocytosis 10/15/2016   Iron deficiency anemia due to chronic blood loss    Symptomatic anemia    Asthma exacerbation 10/11/2016   Hypokalemia 10/11/2016   Acute asthma 04/22/2016   Acute sinusitis, unspecified 03/26/2014   Essential hypertension 12/08/2013   Steroid-induced  diabetes (HCC) 09/26/2013   Anemia due to menorrhagia and fibroids 11/11/2012   Fibroids 08/18/2012   Menorrhagia 08/18/2012   Extrinsic asthma 08/15/2011    REFERRING DIAG: Myalgia, unspecified site   THERAPY DIAG:  Pain in right leg  Muscle weakness (generalized)  Rationale for Evaluation and Treatment Rehabilitation  PERTINENT HISTORY: See PMH above   PRECAUTIONS: None    SUBJECTIVE:                                                                                                                                                                                      SUBJECTIVE STATEMENT:  She reports right thigh ache today. Pain is shooting up from the thigh. She did do 4 stairs over the weekend and wasn't able to put much weight through the right leg. When she stands to get food, she has to unweight the right leg due to pain.  PAIN:  Are you having pain? Yes:  NPRS scale: 8/10 Pain location: Right leg Pain description: Cramping, numb Aggravating factors: Movement of the right leg, standing, walking Relieving factors: Rest, propping up the leg   OBJECTIVE: (objective measures completed at initial evaluation unless otherwise dated) PATIENT SURVEYS:  FOTO 12 % functional status       MUSCLE LENGTH: Unable to accurately assess secondary to pain   POSTURE:             Patient exhibits reduced weight placed through right leg secondary to pain   PALPATION: Tender to palpation right mid anterior thigh   LOWER EXTREMITY MMT:   MMT Right eval Left eval Right 12/01/22 Right 12/08/22  Hip flexion 3 4 3    Hip extension        Hip abduction        Hip adduction        Hip internal rotation        Hip external rotation        Knee flexion 4- 5    Knee extension 3 5 3 3   Ankle dorsiflexion 4 5    Ankle plantarflexion        Ankle inversion        Ankle eversion         (Blank rows = not tested)   LOWER EXTREMITY ROM:   AROM Right eval Left eval  Hip flexion      Hip extension      Hip abduction      Hip adduction      Hip internal rotation      Hip external rotation      Knee flexion 110 115  Knee extension 0 0  Ankle dorsiflexion  Ankle plantarflexion      Ankle inversion      Ankle eversion       (Blank rows = not tested)   FUNCTIONAL TESTS:  Not assessed   GAIT: Assistive device utilized: None Level of assistance: Complete Independence Comments: Antalgic on right     TODAY'S TREATMENT:    [facility] Adult PT Treatment:                                                DATE: 12/08/2022 Therapeutic  Exercise: NuStep L3 x 8 min with UE/LE to work on right LE movement LAQ 2 x 10 Seated clamshell with red 2 x 10 Seated heel-toe raises 2 x 10 Seated hip march 2 x 10 SAQ 2 x 10 Supine heel slide x 10 SLR partial range 2 x 5 Forward 2" step-up 2 x 5 Standing TKE with red 2 x 10   [facility] Adult PT Treatment:                                                DATE: 12/01/2022 Therapeutic Exercise: NuStep L3 x 8 min with UE/LE to work on right LE movement Modified thomas stretch edge of mat table x 3 min Hooklying adductor ball squeeze x 10 Hooklying clamshell with yellow x 10 Quad set with small ball under knee x 10 LAQ partial range x 10 Seated heel-toe raises x 20 Seated hip march x 10 Standing TKE with yellow x 10  [facility] Adult PT Treatment:                                                DATE: 11/26/2022 Therapeutic Exercise: Instruction on using a roller to the anterior thigh Modified thomas stretch edge of mat table Quad set LAQ Seated heel-toe raises   PATIENT EDUCATION:  Education details: HEP update Person educated: Patient Education method: Explanation, Demonstration, Tactile cues, Verbal cues, Handout Education comprehension: verbalized understanding, returned demonstration, verbal cues required, tactile cues required, and needs further education   HOME EXERCISE PROGRAM: Access Code: [email or web address]      ASSESSMENT: CLINICAL IMPRESSION: Patient tolerated therapy well with no adverse effects. Therapy continues to focus on progressing right leg strengthening and patient able to progress with closed chain strengthening. She was able to demonstrate ability to perform SLR partial range this visit and performed 2" step-up. She reports cramping sensation of right that has improved and has seemed to have move higher in the thigh. Updated HEP to progress her strengthening for home. Patient would benefit from continued skilled PT to progress her mobility and strength in order to reduce pain  and maximize functional ability.     OBJECTIVE IMPAIRMENTS: Abnormal gait, decreased activity tolerance, difficulty walking, decreased ROM, decreased strength, impaired flexibility, and pain.    ACTIVITY LIMITATIONS: lifting, standing, squatting, stairs, dressing, and locomotion level   PARTICIPATION LIMITATIONS: meal prep, cleaning, laundry, shopping, community activity, and occupation   PERSONAL FACTORS: Fitness and Past/current experiences are also affecting patient's functional outcome.      GOALS: Goals reviewed with patient? Yes   SHORT  TERM GOALS: Target date: 12/17/2022   Patient will be I with initial HEP in order to progress with therapy. Baseline: HEP provided at eval Goal status: INITIAL   2.  Patient will report right leg pain </= 5/10 in order to reduce functional limitations Baseline: 8/10 Goal status: INITIAL   LONG TERM GOALS: Target date: 01/07/2023   Patient will be I with final HEP to maintain progress from PT. Baseline: HEP provided at eval Goal status: INITIAL   2.  Patient will report >/= 50% status on FOTO to indicate improved functional ability. Baseline: 12% functional status Goal status: INITIAL   3.  Patient will demonstrate right leg strength 5/5 MMT in order to improve stair negotiation Baseline: grossly 3/5 MMT, limited secondary to pain Goal status: INITIAL   4.  Patient will be able to standing >/= 1 hour to improve ability to perform work related tasks Baseline: able to stand short periods due to pain Goal status: INITIAL     PLAN: PT FREQUENCY: 1x/week   PT DURATION: 6 weeks   PLANNED INTERVENTIONS: Therapeutic exercises, Therapeutic activity, Neuromuscular re-education, Balance training, Gait training, Patient/Family education, Self Care, Joint mobilization, Aquatic Therapy, Dry Needling, Electrical stimulation, Cryotherapy, Moist heat, Taping, Manual therapy, and Re-evaluation   PLAN FOR NEXT SESSION: Review HEP and progress PRN,  gentle manual and stretch for the right quad and leg, progress quad and right leg strengthening as able, progress to closed chain standing exercises as able   Rosana Hoes, PT, DPT, LAT, ATC 12/08/22  11:51 AM Phone: [phone] Fax: [phone]

## 2022-12-08 NOTE — Patient Instructions (Signed)
Access Code: [email or web address] URL: [email or web address] Date: 12/08/2022 Prepared by: Rosana Hoes  Exercises - Modified Erby Pian  - 3 x daily - 30-60 seconds hold - Active Straight Leg Raise with Quad Set  - 2 x daily - 2 sets - 5 reps - Seated Hip Abduction with Resistance  - 2 x daily - 2 sets - 10 reps - Seated March  - 2 x daily - 2 sets - 10 reps - Seated Long Arc Quad  - 2 x daily - 2 sets - 10 reps - Seated Heel Toe Raises  - 3 x daily - 10-20 reps

## 2022-12-16 NOTE — Therapy (Addendum)
OUTPATIENT PHYSICAL THERAPY TREATMENT NOTE  DISCHARGE   Patient Name: [name] MRN: [id number] DOB:1976-06-20, 47 y.o., female Today's Date: 12/17/2022  PCP: Diamantina Providence, FNP REFERRING PROVIDER: Jola Schmidt, PA-C   END OF SESSION:   PT End of Session - 12/17/22 0852     Visit Number 4    Number of Visits 7    Date for PT Re-Evaluation 01/07/23    Authorization Type [employer] MCR    Authorization Time Period 12/01/2022 - 01/16/2023    Authorization - Visit Number 3    Authorization - Number of Visits 12    PT Start Time 0846    PT Stop Time 0928    PT Time Calculation (min) 42 min    Activity Tolerance Patient tolerated treatment well    Behavior During Therapy WFL for tasks assessed/performed               Past Medical History:  Diagnosis Date   Anemia    Asthma    Blood transfusion without reported diagnosis    Environmental allergies    Fibroid, uterine    Fibroids    GERD (gastroesophageal reflux disease)    Hypertension    no meds   Menorrhagia    Seasonal allergies    chronic   Shortness of breath    Sleep apnea    does not use CPAP   Past Surgical History:  Procedure Laterality Date   ABDOMINAL HYSTERECTOMY Bilateral 02/08/2018   Procedure: HYSTERECTOMY ABDOMINAL WITH LEFT SALPINGO-OOPORECTOMY AND RIGHT SALPINGECTOMY;  Surgeon: Willodean Rosenthal, MD;  Location: [facility];  Service: Gynecology;  Laterality: Bilateral;   CHOLECYSTECTOMY     06/29/2011   CHOLECYSTECTOMY  06/29/2011   Procedure: LAPAROSCOPIC CHOLECYSTECTOMY WITH INTRAOPERATIVE CHOLANGIOGRAM;  Surgeon: Robyne Askew, MD;  Location: [facility] OR;  Service: General;  Laterality: N/A;   ECTOPIC PREGNANCY SURGERY     Laparoscopic   ERCP  06/30/2011   Procedure: ENDOSCOPIC RETROGRADE CHOLANGIOPANCREATOGRAPHY (ERCP);  Surgeon: Petra Kuba, MD;  Location: [facility] ENDOSCOPY;  Service: Endoscopy;  Laterality: N/A;  probable sphincterotomy   ERCP  07/03/2011   Procedure: ENDOSCOPIC  RETROGRADE CHOLANGIOPANCREATOGRAPHY (ERCP);  Surgeon: Freddy Jaksch, MD;  Location: [facility] OR;  Service: Endoscopy;  Laterality: N/A;  PROPOFOL   MYOMECTOMY  2014   MYOMECTOMY N/A 11/14/2012   Procedure: MYOMECTOMY;  Surgeon: Tereso Newcomer, MD;  Location: [facility];  Service: Gynecology;  Laterality: N/A;   NASAL POLYP EXCISION  2016   in [facility]   WISDOM TOOTH EXTRACTION     Patient Active Problem List   Diagnosis Date Noted   Acute asthma exacerbation 11/03/2022   Asthma exacerbation attacks 11/02/2022   Hyperglycemia 11/02/2022   GERD (gastroesophageal reflux disease) 11/02/2022   Tachycardia 11/02/2022   Hyperlipidemia 01/02/2021   Prediabetes 01/02/2021   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 [facility]) 01/02/2021   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   Complex cyst of left ovary 02/09/2018   Post-operative state 02/08/2018   Non compliance w medication regimen 12/08/2017   Perennial and seasonal allergic rhinitis 02/22/2017   Severe persistent asthma 02/22/2017   Polyp of nasal cavity 02/22/2017   Leukocytosis 10/15/2016   Iron deficiency anemia due to chronic blood loss    Symptomatic anemia    Asthma exacerbation 10/11/2016   Hypokalemia 10/11/2016   Acute asthma 04/22/2016   Acute sinusitis, unspecified 03/26/2014   Essential hypertension 12/08/2013  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   Anemia due to menorrhagia and fibroids 11/11/2012   Fibroids 08/18/2012   Menorrhagia 08/18/2012   Extrinsic asthma 08/15/2011    REFERRING DIAG: Myalgia, unspecified site   THERAPY DIAG:  Pain in right leg  Muscle weakness (generalized)  Rationale for Evaluation and Treatment Rehabilitation  PERTINENT HISTORY: See PMH above   PRECAUTIONS: None    SUBJECTIVE:                                                                                                                                                                                      SUBJECTIVE STATEMENT:  She reports she is doing alright today. She reports yesterday was the first day she was walking around in the department store, she did stop about 3-4 times for a minute to rest and take pressure off the right leg.   PAIN:  Are you having pain? Yes:  NPRS scale: 5/10 Pain location: Right leg Pain description: Cramping, numb Aggravating factors: Movement of the right leg, standing, walking Relieving factors: Rest, propping up the leg   OBJECTIVE: (objective measures completed at initial evaluation unless otherwise dated) PATIENT SURVEYS:  FOTO 12 % functional status       MUSCLE LENGTH: Unable to accurately assess secondary to pain   POSTURE:             Patient exhibits reduced weight placed through right leg secondary to pain   PALPATION: Tender to palpation right mid anterior thigh   LOWER EXTREMITY MMT:   MMT Right eval Left eval Right 12/01/22 Right 12/08/22  Hip flexion 3 4 3    Hip extension        Hip abduction        Hip adduction        Hip internal rotation        Hip external rotation        Knee flexion 4- 5    Knee extension 3 5 3 3   Ankle dorsiflexion 4 5    Ankle plantarflexion        Ankle inversion        Ankle eversion         (Blank rows = not tested)   LOWER EXTREMITY ROM:   AROM Right eval Left eval Right 12/17/2022  Hip flexion       Hip extension       Hip abduction       Hip adduction       Hip internal rotation       Hip external rotation       Knee flexion 110 115 115  Knee extension 0 0  Ankle dorsiflexion       Ankle plantarflexion       Ankle inversion       Ankle eversion        (Blank rows = not tested)   FUNCTIONAL TESTS:  Not assessed   GAIT: Assistive device utilized: None Level of assistance: Complete Independence Comments: Antalgic on right     TODAY'S TREATMENT:    [facility] Adult PT Treatment:                                                DATE:  12/17/2022 Therapeutic Exercise: NuStep L5 x 5 min with LE to work on right LE movement Supine SAQ with ball under knee x 10 SLR 3 x 5 Bridge 2 x 5 Sidelying hip abduction 3 x 5 LAQ 2 x 10 on right Standing hip abduction x 10 each Standing hip extension x 10 each Standing heel toe raises x 10 Standing march x 10 Forward 4" step-up x 10   [facility] Adult PT Treatment:                                                DATE: 12/08/2022 Therapeutic Exercise: NuStep L3 x 8 min with UE/LE to work on right LE movement LAQ 2 x 10 Seated clamshell with red 2 x 10 Seated heel-toe raises 2 x 10 Seated hip march 2 x 10 SAQ 2 x 10 Supine heel slide x 10 SLR partial range 2 x 5 Forward 2" step-up 2 x 5 Standing TKE with red 2 x 10  [facility] Adult PT Treatment:                                                DATE: 12/01/2022 Therapeutic Exercise: NuStep L3 x 8 min with UE/LE to work on right LE movement Modified thomas stretch edge of mat table x 3 min Hooklying adductor ball squeeze x 10 Hooklying clamshell with yellow x 10 Quad set with small ball under knee x 10 LAQ partial range x 10 Seated heel-toe raises x 20 Seated hip march x 10 Standing TKE with yellow x 10  [facility] Adult PT Treatment:                                                DATE: 11/26/2022 Therapeutic Exercise: Instruction on using a roller to the anterior thigh Modified thomas stretch edge of mat table Quad set LAQ Seated heel-toe raises   PATIENT EDUCATION:  Education details: HEP update Person educated: Patient Education method: Explanation, Demonstration, Tactile cues, Verbal cues, Handout Education comprehension: verbalized understanding, returned demonstration, verbal cues required, tactile cues required, and needs further education   HOME EXERCISE PROGRAM: Access Code: [email or web address]      ASSESSMENT: CLINICAL IMPRESSION: Patient tolerated therapy well with no adverse effects. She does demonstrate improved right knee flexion  this visit and is reporting improved community walking ability. Therapy continues to focus on progressing her right  hip and knee strengthening and she was able to progress to more standing exercises. She does report increase in right thigh pain with weight bearing on the right but is able to complete all prescribed exercises. Updated HEP to progress standing strengthening at home. Patient would benefit from continued skilled PT to progress her mobility and strength in order to reduce pain and maximize functional ability.     OBJECTIVE IMPAIRMENTS: Abnormal gait, decreased activity tolerance, difficulty walking, decreased ROM, decreased strength, impaired flexibility, and pain.    ACTIVITY LIMITATIONS: lifting, standing, squatting, stairs, dressing, and locomotion level   PARTICIPATION LIMITATIONS: meal prep, cleaning, laundry, shopping, community activity, and occupation   PERSONAL FACTORS: Fitness and Past/current experiences are also affecting patient's functional outcome.      GOALS: Goals reviewed with patient? Yes   SHORT TERM GOALS: Target date: 12/17/2022   Patient will be I with initial HEP in order to progress with therapy. Baseline: HEP provided at eval 12/17/2022: progressing Goal status: ONGOING   2.  Patient will report right leg pain </= 5/10 in order to reduce functional limitations Baseline: 8/10 12/17/2022: 5/10 Goal status: MET   LONG TERM GOALS: Target date: 01/07/2023   Patient will be I with final HEP to maintain progress from PT. Baseline: HEP provided at eval Goal status: INITIAL   2.  Patient will report >/= 50% status on FOTO to indicate improved functional ability. Baseline: 12% functional status Goal status: INITIAL   3.  Patient will demonstrate right leg strength 5/5 MMT in order to improve stair negotiation Baseline: grossly 3/5 MMT, limited secondary to pain Goal status: INITIAL   4.  Patient will be able to standing >/= 1 hour to improve ability to  perform work related tasks Baseline: able to stand short periods due to pain Goal status: INITIAL     PLAN: PT FREQUENCY: 1x/week   PT DURATION: 6 weeks   PLANNED INTERVENTIONS: Therapeutic exercises, Therapeutic activity, Neuromuscular re-education, Balance training, Gait training, Patient/Family education, Self Care, Joint mobilization, Aquatic Therapy, Dry Needling, Electrical stimulation, Cryotherapy, Moist heat, Taping, Manual therapy, and Re-evaluation   PLAN FOR NEXT SESSION: Review HEP and progress PRN, gentle manual and stretch for the right quad and leg, progress quad and right leg strengthening as able, progress to closed chain standing exercises as able   Rosana Hoes, PT, DPT, LAT, ATC 12/17/22  9:50 AM Phone: [phone] Fax: [phone]    PHYSICAL THERAPY DISCHARGE SUMMARY  Visits from Start of Care: 4  Current functional level related to goals / functional outcomes: See above   Remaining deficits: See above   Education / Equipment: HEP   Patient agrees to discharge. Patient goals were not met. Patient is being discharged due to not returning since the last visit.  Rosana Hoes, PT, DPT, LAT, ATC 01/21/23  11:45 AM Phone: [phone] Fax: [phone]

## 2022-12-17 ENCOUNTER — Other Ambulatory Visit: Payer: Self-pay

## 2022-12-17 ENCOUNTER — Ambulatory Visit: Payer: Medicaid Other | Attending: Internal Medicine | Admitting: Physical Therapy

## 2022-12-17 ENCOUNTER — Encounter: Payer: Self-pay | Admitting: Physical Therapy

## 2022-12-17 DIAGNOSIS — M6281 Muscle weakness (generalized): Secondary | ICD-10-CM | POA: Diagnosis present

## 2022-12-17 DIAGNOSIS — M79604 Pain in right leg: Secondary | ICD-10-CM | POA: Diagnosis present

## 2022-12-17 NOTE — Patient Instructions (Signed)
Access Code: [email or web address] URL: [email or web address] Date: 12/17/2022 Prepared by: Rosana Hoes  Exercises - Modified Thomas Stretch  - 3 x daily - 30-60 seconds hold - Active Straight Leg Raise with Quad Set  - 1 x daily - 3 sets - 5 reps - Seated Hip Abduction with Resistance  - 1 x daily - 2 sets - 10 reps - Seated Long Arc Quad  - 1 x daily - 2 sets - 10 reps - Standing Hip Abduction with Counter Support  - 1 x daily - 2 sets - 10 reps - Standing Hip Extension with Counter Support  - 1 x daily - 2 sets - 10 reps - Standing March with Counter Support  - 1 x daily - 2 sets - 10 reps - Heel Raises with Counter Support  - 1 x daily - 2 sets - 10 reps

## 2022-12-21 ENCOUNTER — Telehealth: Payer: Self-pay | Admitting: Physical Therapy

## 2022-12-21 NOTE — Telephone Encounter (Signed)
Patient called regarding POC not being received by Referring providers office. Contacted [facility] and was asked to fax POC to [phone] Patient called to follow up to ensure it was received. Office states they did not receive it.  Called back to [phone] received an additional fax # of [phone] (refaxed) from rep who states they would call our office once receive.

## 2022-12-24 ENCOUNTER — Ambulatory Visit: Payer: Medicaid Other | Admitting: Physical Therapy

## 2023-01-05 ENCOUNTER — Ambulatory Visit: Payer: Medicaid Other | Admitting: Physical Therapy

## 2023-01-05 ENCOUNTER — Telehealth: Payer: Self-pay | Admitting: Physical Therapy

## 2023-01-05 NOTE — Telephone Encounter (Signed)
Spoke with patient regarding missed PT appointment. She stated she is having insurance coverage issues so she thought she had canceled this appointment. Patient was reminded of her next scheduled appointments, but due to being out of her PT POC she was rescheduled to see a PT on 01/18/2023 and she was encouraged to contact the clinic if she was able to have insurance issues resolved to try and schedule a sooner appointment with a PT. Patient reminded of attendance policy and she expressed understanding.  Rosana Hoes, PT, DPT, LAT, ATC 01/05/23  11:02 AM Phone: [phone] Fax: [phone]

## 2023-01-11 ENCOUNTER — Encounter (INDEPENDENT_AMBULATORY_CARE_PROVIDER_SITE_OTHER): Payer: Medicare HMO | Admitting: Family Medicine

## 2023-01-12 ENCOUNTER — Ambulatory Visit: Payer: Medicare Other | Admitting: Physical Therapy

## 2023-01-18 ENCOUNTER — Ambulatory Visit: Payer: Medicare Other | Admitting: Physical Therapy

## 2023-01-19 ENCOUNTER — Ambulatory Visit: Payer: Medicare Other | Admitting: Physical Therapy

## 2023-04-16 ENCOUNTER — Emergency Department (HOSPITAL_COMMUNITY): Payer: Medicare Other

## 2023-04-16 ENCOUNTER — Emergency Department (HOSPITAL_COMMUNITY)
Admission: EM | Admit: 2023-04-16 | Discharge: 2023-04-17 | Disposition: A | Discharge status: Home / Self Care | Payer: Medicaid Other | Attending: Emergency Medicine | Admitting: Emergency Medicine

## 2023-04-16 DIAGNOSIS — S92155A Nondisplaced avulsion fracture (chip fracture) of left talus, initial encounter for closed fracture: Secondary | ICD-10-CM | POA: Insufficient documentation

## 2023-04-16 DIAGNOSIS — W109XXA Fall (on) (from) unspecified stairs and steps, initial encounter: Secondary | ICD-10-CM | POA: Insufficient documentation

## 2023-04-16 DIAGNOSIS — M25572 Pain in left ankle and joints of left foot: Secondary | ICD-10-CM | POA: Diagnosis present

## 2023-04-16 NOTE — ED Provider Triage Note (Signed)
Emergency Medicine Provider Triage Evaluation Note  [name] , a 47 y.o. female  was evaluated in triage. Pt complains of ankle pain.  Started about a week ago after a fall.  Patient states she fell down a step and twisted her left ankle.  Denies any other associated injuries with the fall.  States ankle is swollen and painful all around the joint.  Denies calf tenderness, chest pain shortness of breath.   Review of Systems  Positive: See above Negative: See above  Physical Exam  BP (!) 169/87   Pulse 91   Temp 98.5 F (36.9 C) (Oral)   Resp 18   Ht 5\' 2"  (1.575 m)   Wt 113.4 kg   LMP 12/19/2017   SpO2 98%   BMI 45.73 kg/m  Gen:   Awake, no distress   Resp:  Normal effort  MSK:   Moves extremities without difficulty  Other:    Medical Decision Making  Medically screening exam initiated at 9:51 PM.  Appropriate orders placed.  [name] was informed that the remainder of the evaluation will be completed by another provider, this initial triage assessment does not replace that evaluation, and the importance of remaining in the ED until their evaluation is complete.  Work up started   Gareth Eagle, [place] 04/16/23 2152

## 2023-04-16 NOTE — ED Triage Notes (Signed)
Patient in today reporting fall 1 week ago falling going up the steps, reporting left ankle pain with swelling.

## 2023-04-17 MED ORDER — NAPROXEN 500 MG PO TABS
500.0000 mg | ORAL_TABLET | Freq: Once | ORAL | Status: DC
  Filled 2023-04-17: qty 1

## 2023-04-17 MED ORDER — IBUPROFEN 800 MG PO TABS
800.0000 mg | ORAL_TABLET | Freq: Once | ORAL | Status: AC
  Administered 2023-04-17: 800 mg via ORAL
  Filled 2023-04-17: qty 1

## 2023-04-17 NOTE — ED Notes (Signed)
12:22 AM  Patient is alert and oriented x 4. She has equal rise and fall of the chest wall with clear lung sounds. Patient reports falling going up carpeted stairs x 1 week ago. She c/o left ankle pain and swelling that has progressively worsened. Patient denies hit head. Denies LOC. She has been ambulatory since the fall. Currently rating pain 10/10 on pain scale. Pending xray results and ER Provider assessment and orders. Cap refill <2 secs. Patient is able to wiggle toes and move LLE. Bed in lowest position.

## 2023-04-17 NOTE — ED Provider Notes (Signed)
[place] EMERGENCY DEPARTMENT AT [facility] Provider Note   CSN: [id number] Arrival date & time: 04/16/23  2036     History {Add pertinent medical, surgical, social history, OB history to HPI:1} Chief Complaint  Patient presents with   Ankle Pain    [name] is a 47 y.o. female.  47 year old female presents to the emergency department for evaluation of right ankle pain.  She reports that she fell down a step and twisted her left ankle about a week ago.  Her pain is worse with movement as well as weightbearing.  Pain has become more significant over the past 48 hours.  She has not taken any medications for her pain.  Notes some associated swelling.  No numbness, weakness, calf tenderness, chest pain, shortness of breath.  The history is provided by the patient and a relative. No language interpreter was used.  Ankle Pain      Home Medications Prior to Admission medications   Medication Sig Start Date End Date Taking? Authorizing Provider  acetaminophen (TYLENOL) 500 MG tablet Take 1 tablet (500 mg total) by mouth every 6 (six) hours as needed. Patient not taking: Reported on 11/26/2022 11/15/22   Redwine, Madison A, PA-C  albuterol (VENTOLIN HFA) 108 (90 Base) MCG/ACT inhaler Inhale 2 puffs into the lungs every 6 (six) hours as needed for wheezing or shortness of breath. 11/05/22   [place], Alvira Philips, DO  budesonide-formoterol (SYMBICORT) 160-4.5 MCG/ACT inhaler Inhale 2 puffs into the lungs 2 (two) times daily. 01/15/21   Elenore Paddy, NP  Dupilumab (DUPIXENT [place]) Inject 1 Syringe into the skin every 14 (fourteen) days.    [provider]  losartan (COZAAR) 25 MG tablet Take 25 mg by mouth daily. Patient not taking: Reported on 11/26/2022    [provider]  triamterene-hydrochlorothiazide (MAXZIDE-25) 37.5-25 MG tablet Take 1 tablet by mouth daily. 12/04/20   Wilson Singer, MD      Allergies    Bee venom, Prednisone, and Naproxen    Review of  Systems   Review of Systems Ten systems reviewed and are negative for acute change, except as noted in the HPI.    Physical Exam Updated Vital Signs BP (!) 153/103 (BP Location: Right Arm)   Pulse 90   Temp 98.2 F (36.8 C) (Oral)   Resp 18   Ht 5\' 2"  (1.575 m)   Wt 113.4 kg   LMP 12/19/2017   SpO2 100%   BMI 45.73 kg/m   Physical Exam Vitals and nursing note reviewed.  Constitutional:      General: She is not in acute distress.    Appearance: She is well-developed. She is not diaphoretic.     Comments: Nontoxic appearing and in NAD  HENT:     Head: Normocephalic and atraumatic.  Eyes:     General: No scleral icterus.    Extraocular Movements: EOM normal.     Conjunctiva/sclera: Conjunctivae normal.  Cardiovascular:     Rate and Rhythm: Normal rate and regular rhythm.     Pulses: Normal pulses.     Comments: DP pulse 2+ in the LLE. Capillary refill brisk in all digits of the L foot. Pulmonary:     Effort: Pulmonary effort is normal. No respiratory distress.  Musculoskeletal:        General: Swelling and tenderness present. No deformity.     Cervical back: Normal range of motion.     Comments: TTP to the anterior L ankle without crepitus  or deformity. Mild soft tissue swelling to the dorsum of the L foot. No erythema, heat to touch, pitting edema.  Skin:    General: Skin is warm and dry.     Coloration: Skin is not pale.     Findings: No erythema or rash.  Neurological:     Mental Status: She is alert and oriented to person, place, and time.  Psychiatric:        Mood and Affect: Mood and affect normal.        Behavior: Behavior normal.     ED Results / Procedures / Treatments   Labs (all labs ordered are listed, but only abnormal results are displayed) Labs Reviewed - No data to display  EKG None  Radiology DG Ankle Complete Left  Result Date: 04/16/2023 CLINICAL DATA:  Pain, fall 1 week ago EXAM: LEFT ANKLE COMPLETE - 3+ VIEW COMPARISON:  None  Available. FINDINGS: Slight irregularity overlying the dorsal talus may represent an avulsion type fracture. No other fracture of the ankle. No dislocation. No ankle joint effusion. The ankle mortise is preserved. Small plantar calcaneal spur and Achilles tendon enthesophyte. Mild soft tissue edema. IMPRESSION: Slight irregularity overlying the dorsal talus may represent an avulsion type fracture. No other fracture of the ankle. Electronically Signed   By: Narda Rutherford M.D.   On: 04/16/2023 23:28    Procedures Procedures  {Document cardiac monitor, telemetry assessment procedure when appropriate:1}  Medications Ordered in ED Medications  ibuprofen (ADVIL) tablet 800 mg (has no administration in time range)    ED Course/ Medical Decision Making/ A&P   {   Click here for ABCD2, HEART and other calculatorsREFRESH Note before signing :1}                              Medical Decision Making Amount and/or Complexity of Data Reviewed Radiology: ordered.  Risk Prescription drug management.   ***  {Document critical care time when appropriate:1} {Document review of labs and clinical decision tools ie heart score, Chads2Vasc2 etc:1}  {Document your independent review of radiology images, and any outside records:1} {Document your discussion with family members, caretakers, and with consultants:1} {Document social determinants of health affecting pt's care:1} {Document your decision making why or why not admission, treatments were needed:1} Final Clinical Impression(s) / ED Diagnoses Final diagnoses:  Closed nondisplaced avulsion fracture of left talus, initial encounter    Rx / DC Orders ED Discharge Orders     None

## 2023-04-17 NOTE — Discharge Instructions (Signed)
Use a walking boot for stability and follow up with Orthopedics. Only remove your boot to shower. Take tylenol or ibuprofen consistently for pain control. Use as instructed on the box/bottle. Return for new or concerning symptoms.

## 2023-04-21 ENCOUNTER — Encounter: Payer: Self-pay | Admitting: Physician Assistant

## 2023-04-21 ENCOUNTER — Ambulatory Visit: Payer: Medicaid Other | Admitting: Physician Assistant

## 2023-04-21 DIAGNOSIS — M25572 Pain in left ankle and joints of left foot: Secondary | ICD-10-CM | POA: Diagnosis not present

## 2023-04-21 NOTE — Progress Notes (Signed)
Office Visit Note   Patient: [name]           Date of Birth: [birth year]           MRN: [id number] Visit Date: 04/21/2023              Requested by: Diamantina Providence, FNP [address] [place],  [place] [address] PCP: Diamantina Providence, FNP   Assessment & Plan: Visit Diagnoses:  1. Pain in left ankle and joints of left foot     Plan: [name] is a 47 year old woman with a 1 week history of left dorsal ankle pain.  She reports a history of falling on some stairs and impacting her left knee in front of her ankle with a stair.  She was seen recently in the emergency room where x-rays were taken.  She does have some chronic calcaneal spurring but question of a small dorsal avulsion fracture off the talus.  She has been wearing a cam walker boot and using ibuprofen.  No previous injury.  Rates her pain as moderate she certainly has moderate plus pain when palpating.  I think she just has a small dorsal avulsion fracture she has good dorsiflexion and plantarflexion however dorsiflexion does reproduce her pain.  I recommended continued use of the boot she is coming out to keep her ankle flexible.  Will follow-up in 2 weeks.  Follow-Up Instructions: No follow-ups on file.   Orders:  No orders of the defined types were placed in this encounter.  No orders of the defined types were placed in this encounter.     Procedures: No procedures performed   Clinical Data: No additional findings.   Subjective: Chief Complaint  Patient presents with   Left Ankle - Fracture    HPI pleasant 47 year old woman with 1 week history of dorsal ankle pain after falling on some stairs and hitting the front of her ankle in the front of her knee.  Was seen in the emergency room x-rays of her ankle did not show any acute fracture with the exception of a small possible avulsion of the dorsal side of the talus  Review of Systems  All other systems reviewed and are  negative.    Objective: Vital Signs: LMP 12/19/2017   Physical Exam Constitutional:      Appearance: Normal appearance.  Pulmonary:     Effort: Pulmonary effort is normal.  Skin:    General: Skin is warm and dry.  Neurological:     General: No focal deficit present.     Mental Status: She is alert and oriented to person, place, and time.     Ortho Exam Examination she has no midfoot pain she has no plantar ecchymosis she has no pain over the lateral medial ankle.  She does have tenderness over the front of the ankle joint over the capsule as well as as the talus.  She has good dorsiflexion plantarflexion eversion and inversion good endpoint on anterior draw she does however have pain with resisted dorsiflexion of her ankle she is neurovascular intact Specialty Comments:  No specialty comments available.  Imaging: No results found.   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   Complex cyst of left ovary 02/09/2018   Post-operative state 02/08/2018   Non compliance w medication regimen 12/08/2017   Perennial and seasonal allergic rhinitis 02/22/2017   Severe persistent asthma 02/22/2017   Polyp of nasal cavity 02/22/2017   Leukocytosis 10/15/2016   Iron deficiency anemia due to chronic blood loss    Symptomatic anemia    Asthma exacerbation 10/11/2016   Hypokalemia 10/11/2016   Acute asthma 04/22/2016   Acute sinusitis, unspecified 03/26/2014   Essential hypertension 12/08/2013    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   Anemia due to menorrhagia and fibroids 11/11/2012   Fibroids 08/18/2012   Menorrhagia 08/18/2012   Extrinsic asthma 08/15/2011   Past Medical History:  Diagnosis Date   Anemia    Asthma    Blood transfusion without reported diagnosis    Environmental allergies    Fibroid, uterine    Fibroids    GERD (gastroesophageal reflux disease)    Hypertension    no meds   Menorrhagia    Seasonal allergies    chronic   Shortness of breath    Sleep apnea    does not use CPAP    Family History  Problem Relation Age of Onset   Hypertension Mother    Diabetes Mother    Hypertension Father    Hypertension Brother    Anesthesia problems Neg Hx    Hypotension Neg Hx    Malignant hyperthermia Neg Hx    Pseudochol deficiency Neg Hx     Past Surgical History:  Procedure Laterality Date   ABDOMINAL HYSTERECTOMY Bilateral 02/08/2018   Procedure: HYSTERECTOMY ABDOMINAL WITH LEFT SALPINGO-OOPORECTOMY AND RIGHT SALPINGECTOMY;  Surgeon: Willodean Rosenthal, MD;  Location: [facility] ORS;  Service: Gynecology;  Laterality: Bilateral;   CHOLECYSTECTOMY     06/29/2011   CHOLECYSTECTOMY  06/29/2011   Procedure: LAPAROSCOPIC CHOLECYSTECTOMY WITH INTRAOPERATIVE CHOLANGIOGRAM;  Surgeon: Robyne Askew, MD;  Location: [facility] OR;  Service: General;  Laterality: N/A;   ECTOPIC PREGNANCY SURGERY     Laparoscopic   ERCP  06/30/2011   Procedure: ENDOSCOPIC RETROGRADE CHOLANGIOPANCREATOGRAPHY (ERCP);  Surgeon: Petra Kuba, MD;  Location: [facility] ENDOSCOPY;  Service: Endoscopy;  Laterality: N/A;  probable sphincterotomy   ERCP  07/03/2011   Procedure: ENDOSCOPIC RETROGRADE CHOLANGIOPANCREATOGRAPHY (ERCP);  Surgeon: Freddy Jaksch, MD;  Location: [facility] OR;  Service: Endoscopy;  Laterality: N/A;  PROPOFOL   MYOMECTOMY  2014   MYOMECTOMY N/A 11/14/2012   Procedure: MYOMECTOMY;  Surgeon: Tereso Newcomer, MD;  Location: [facility] ORS;  Service: Gynecology;  Laterality: N/A;   NASAL POLYP  EXCISION  2016   in [place]   WISDOM TOOTH EXTRACTION     Social History   Occupational History   Not on file  Tobacco Use   Smoking status: Never   Smokeless tobacco: Never  Vaping Use   Vaping status: Never Used  Substance and Sexual Activity   Alcohol use: Yes    Comment: occasional   Drug use: No   Sexual activity: Yes    Birth control/protection: None

## 2023-05-05 ENCOUNTER — Ambulatory Visit: Payer: Medicaid Other | Admitting: Physician Assistant

## 2023-05-17 ENCOUNTER — Other Ambulatory Visit (HOSPITAL_COMMUNITY): Payer: Self-pay

## 2023-05-18 ENCOUNTER — Other Ambulatory Visit (HOSPITAL_COMMUNITY): Payer: Self-pay

## 2023-05-18 MED ORDER — ONDANSETRON 4 MG PO TBDP
8.0000 mg | ORAL_TABLET | Freq: Three times a day (TID) | ORAL | 0 refills | Status: AC
  Filled 2023-05-18: qty 84, 14d supply, fill #0

## 2023-05-18 MED ORDER — BUDESONIDE-FORMOTEROL FUMARATE 160-4.5 MCG/ACT IN AERO
2.0000 | INHALATION_SPRAY | Freq: Two times a day (BID) | RESPIRATORY_TRACT | 3 refills | Status: DC
  Filled 2023-05-18: qty 10.2, 30d supply, fill #0

## 2023-05-18 MED ORDER — ROSUVASTATIN CALCIUM 10 MG PO TABS
10.0000 mg | ORAL_TABLET | Freq: Every day | ORAL | 3 refills | Status: AC
  Filled 2023-05-18: qty 90, 90d supply, fill #0

## 2023-05-18 MED ORDER — SEMAGLUTIDE-WEIGHT MANAGEMENT 0.25 MG/0.5ML ~~LOC~~ SOAJ
0.2500 mg | SUBCUTANEOUS | 0 refills | Status: DC
  Filled 2023-05-18: qty 2, 28d supply, fill #0

## 2023-05-18 MED ORDER — LORATADINE 10 MG PO TABS
10.0000 mg | ORAL_TABLET | Freq: Every day | ORAL | 5 refills | Status: AC
  Filled 2023-05-18: qty 30, 30d supply, fill #0

## 2023-05-18 MED ORDER — EPINEPHRINE 0.3 MG/0.3ML IJ SOAJ
0.3000 mg | Freq: Once | INTRAMUSCULAR | 0 refills | Status: AC | PRN
  Filled 2023-05-18: qty 2, 2d supply, fill #0

## 2023-05-18 MED ORDER — TRIAMTERENE-HCTZ 37.5-25 MG PO TABS
1.0000 | ORAL_TABLET | Freq: Every day | ORAL | 1 refills | Status: AC
  Filled 2023-05-18: qty 90, 90d supply, fill #0

## 2023-05-18 MED ORDER — SEMAGLUTIDE-WEIGHT MANAGEMENT 0.5 MG/0.5ML ~~LOC~~ SOAJ: 0.5000 mg | SUBCUTANEOUS | 0 refills | Status: DC

## 2023-05-21 ENCOUNTER — Other Ambulatory Visit (HOSPITAL_COMMUNITY): Payer: Self-pay

## 2023-05-28 ENCOUNTER — Other Ambulatory Visit (HOSPITAL_COMMUNITY): Payer: Self-pay

## 2023-06-22 ENCOUNTER — Telehealth: Payer: Self-pay | Admitting: Gastroenterology

## 2023-06-22 NOTE — Telephone Encounter (Signed)
Good morning Dr. Tomasa Rand,    Supervising Provider 06/22/2023 PM   We received a referral for patient to have a colonoscopy. Patient last seen with [facility] in July 2024. Patient is wishing to transfer care due to colonoscopy not being covered by insurance with [facility]. Patient's previous records are in [facility] for you to review and advise on scheduling.    Thank you.

## 2023-07-09 NOTE — Telephone Encounter (Signed)
Patient stated she does not have any symptoms. Requesting a call to schedule colonoscopy at hospital. Please advise, thank you.

## 2023-07-16 NOTE — Telephone Encounter (Signed)
 PT is calling to get an update on scheduling colonoscopy at the hospital. Please advise.

## 2023-07-22 ENCOUNTER — Other Ambulatory Visit: Payer: Self-pay | Admitting: *Deleted

## 2023-07-22 DIAGNOSIS — Z1211 Encounter for screening for malignant neoplasm of colon: Secondary | ICD-10-CM

## 2023-07-22 NOTE — Telephone Encounter (Signed)
 Spoke to patient. She has been scheduled for colonoscopy at [facility] with Dr Stacia on 09/13/23 at 730 am, 600 am arrival. She has also scheduled telephone previsit for 09/03/23 at 8 am. Patient verbalizes understanding of times/dates for upcoming appointments.

## 2023-07-22 NOTE — Telephone Encounter (Signed)
 PT is returning call to discuss appointment

## 2023-07-22 NOTE — Telephone Encounter (Signed)
 Left message for patient to call back. We currently could get her scheduled for hospital colonoscopy with Dr Tomasa Rand on Monday, 09/13/23 ([facility]).

## 2023-09-03 ENCOUNTER — Ambulatory Visit: Payer: Medicaid Other

## 2023-09-03 VITALS — Ht 62.0 in | Wt 276.0 lb

## 2023-09-03 DIAGNOSIS — Z1211 Encounter for screening for malignant neoplasm of colon: Secondary | ICD-10-CM

## 2023-09-03 MED ORDER — NA SULFATE-K SULFATE-MG SULF 17.5-3.13-1.6 GM/177ML PO SOLN
1.0000 | Freq: Once | ORAL | 0 refills | Status: AC
Start: 2023-09-03 — End: 2023-09-03

## 2023-09-03 NOTE — Progress Notes (Signed)
 No egg or soy allergy known to patient  No issues known to pt with past sedation with any surgeries or procedures Patient denies ever being told they had issues or difficulty with intubation  No FH of Malignant Hyperthermia Pt is  on diet pills - Wegovy, hold instructions given Pt is not on  home 02  Pt is not on blood thinners  Pt denies issues with constipation  No A fib or A flutter Have any cardiac testing pending--No Pt can ambulate  Pt denies use of chewing tobacco Discussed diabetic I weight loss medication holds Discussed NSAID holds Checked BMI Pt instructed to use Single[employer] or GoodRx for a price reduction on prep  Patient's chart reviewed by Cathlyn Parsons CNRA prior to previsit and patient is scheduled at [facility] Pre visit completed and red dot placed by patient's name on their procedure day (on provider's schedule).

## 2023-09-06 ENCOUNTER — Encounter (HOSPITAL_COMMUNITY): Payer: Self-pay | Admitting: Gastroenterology

## 2023-09-06 ENCOUNTER — Telehealth: Payer: Self-pay

## 2023-09-06 NOTE — Telephone Encounter (Signed)
 Procedure:colon/EGD Procedure date: 09/13/23 Procedure location: [facility] Arrival Time: 6am Spoke with the patient Y/N: y Any prep concerns? y  Has the patient obtained the prep from the pharmacy ? y Do you have a care partner and transportation: y Any additional concerns? n

## 2023-09-10 NOTE — Anesthesia Preprocedure Evaluation (Addendum)
 Anesthesia Evaluation  Patient identified by MRN, date of birth, ID band Patient awake    Reviewed: Allergy & Precautions, NPO status , Patient's Chart, lab work & pertinent test results  History of Anesthesia Complications Negative for: history of anesthetic complications  Airway Mallampati: III  TM Distance: >3 FB Neck ROM: Full    Dental  (+) Dental Advisory Given   Pulmonary asthma , sleep apnea    Pulmonary exam normal        Cardiovascular hypertension, Pt. on medications Normal cardiovascular exam     Neuro/Psych negative neurological ROS  negative psych ROS   GI/Hepatic Neg liver ROS,GERD  Controlled and Medicated,,  Endo/Other  diabetes, Type 2  Class 4 obesity  Renal/GU negative Renal ROS     Musculoskeletal negative musculoskeletal ROS (+)    Abdominal  (+) + obese  Peds  Hematology negative hematology ROS (+)   Anesthesia Other Findings On GLP-1a   Reproductive/Obstetrics                             Anesthesia Physical Anesthesia Plan  ASA: 3  Anesthesia Plan: MAC   Post-op Pain Management: Minimal or no pain anticipated   Induction:   PONV Risk Score and Plan: 2 and Propofol infusion and Treatment may vary due to age or medical condition  Airway Management Planned: Natural Airway and Simple Face Mask  Additional Equipment: None  Intra-op Plan:   Post-operative Plan:   Informed Consent: I have reviewed the patients History and Physical, chart, labs and discussed the procedure including the risks, benefits and alternatives for the proposed anesthesia with the patient or authorized representative who has indicated his/her understanding and acceptance.       Plan Discussed with: CRNA and Anesthesiologist  Anesthesia Plan Comments:        Anesthesia Quick Evaluation

## 2023-09-13 ENCOUNTER — Ambulatory Visit (HOSPITAL_COMMUNITY)
Admission: RE | Admit: 2023-09-13 | Discharge: 2023-09-13 | Disposition: A | Discharge status: Home / Self Care | Payer: Medicaid Other | Attending: Gastroenterology | Admitting: Gastroenterology

## 2023-09-13 ENCOUNTER — Other Ambulatory Visit: Payer: Self-pay

## 2023-09-13 ENCOUNTER — Encounter (HOSPITAL_COMMUNITY)
Admission: RE | Disposition: A | Discharge status: Home / Self Care | Payer: Self-pay | Source: Home / Self Care | Attending: Gastroenterology

## 2023-09-13 ENCOUNTER — Encounter (HOSPITAL_COMMUNITY): Payer: Self-pay | Admitting: Gastroenterology

## 2023-09-13 ENCOUNTER — Ambulatory Visit (HOSPITAL_COMMUNITY): Payer: Self-pay | Admitting: Anesthesiology

## 2023-09-13 DIAGNOSIS — Z7985 Long-term (current) use of injectable non-insulin antidiabetic drugs: Secondary | ICD-10-CM | POA: Diagnosis not present

## 2023-09-13 DIAGNOSIS — Z6841 Body Mass Index (BMI) 40.0 and over, adult: Secondary | ICD-10-CM | POA: Diagnosis not present

## 2023-09-13 DIAGNOSIS — D125 Benign neoplasm of sigmoid colon: Secondary | ICD-10-CM

## 2023-09-13 DIAGNOSIS — K219 Gastro-esophageal reflux disease without esophagitis: Secondary | ICD-10-CM | POA: Diagnosis not present

## 2023-09-13 DIAGNOSIS — K649 Unspecified hemorrhoids: Secondary | ICD-10-CM | POA: Insufficient documentation

## 2023-09-13 DIAGNOSIS — K644 Residual hemorrhoidal skin tags: Secondary | ICD-10-CM

## 2023-09-13 DIAGNOSIS — Z1211 Encounter for screening for malignant neoplasm of colon: Secondary | ICD-10-CM

## 2023-09-13 DIAGNOSIS — Z79899 Other long term (current) drug therapy: Secondary | ICD-10-CM | POA: Diagnosis not present

## 2023-09-13 DIAGNOSIS — K5730 Diverticulosis of large intestine without perforation or abscess without bleeding: Secondary | ICD-10-CM

## 2023-09-13 DIAGNOSIS — E6689 Other obesity not elsewhere classified: Secondary | ICD-10-CM | POA: Diagnosis not present

## 2023-09-13 DIAGNOSIS — G4730 Sleep apnea, unspecified: Secondary | ICD-10-CM | POA: Insufficient documentation

## 2023-09-13 DIAGNOSIS — E119 Type 2 diabetes mellitus without complications: Secondary | ICD-10-CM | POA: Insufficient documentation

## 2023-09-13 DIAGNOSIS — J45909 Unspecified asthma, uncomplicated: Secondary | ICD-10-CM | POA: Diagnosis not present

## 2023-09-13 DIAGNOSIS — I10 Essential (primary) hypertension: Secondary | ICD-10-CM | POA: Insufficient documentation

## 2023-09-13 HISTORY — PX: POLYPECTOMY: SHX5525

## 2023-09-13 HISTORY — PX: COLONOSCOPY WITH PROPOFOL: SHX5780

## 2023-09-13 SURGERY — COLONOSCOPY WITH PROPOFOL
Anesthesia: Monitor Anesthesia Care

## 2023-09-13 MED ORDER — LACTATED RINGERS IV SOLN: INTRAVENOUS | Status: DC | PRN

## 2023-09-13 MED ORDER — PROPOFOL 10 MG/ML IV BOLUS
INTRAVENOUS | Status: DC | PRN
Start: 2023-09-13 — End: 2023-09-13
  Administered 2023-09-13: 50 mg via INTRAVENOUS

## 2023-09-13 MED ORDER — PROPOFOL 500 MG/50ML IV EMUL
INTRAVENOUS | Status: DC | PRN
Start: 2023-09-13 — End: 2023-09-13
  Administered 2023-09-13: 140 ug/kg/min via INTRAVENOUS

## 2023-09-13 MED ORDER — SODIUM CHLORIDE 0.9 % IV SOLN: INTRAVENOUS | Status: DC

## 2023-09-13 MED ORDER — PROPOFOL 500 MG/50ML IV EMUL
INTRAVENOUS | Status: AC
  Filled 2023-09-13: qty 50

## 2023-09-13 MED ORDER — PROPOFOL 1000 MG/100ML IV EMUL
INTRAVENOUS | Status: AC
  Filled 2023-09-13: qty 100

## 2023-09-13 SURGICAL SUPPLY — 20 items

## 2023-09-13 NOTE — Discharge Instructions (Signed)

## 2023-09-13 NOTE — Op Note (Signed)
 [facility] Patient Name: [name] Procedure Date: 09/13/2023 MRN: [id number] Attending MD: Dub Amis. Tomasa Rand , MD, [id number] Date of Birth: Oct 09, 1975 CSN: [id number] Age: 48 Admit Type: Outpatient Procedure:                Colonoscopy Indications:              Screening for colorectal malignant neoplasm, This                            is the patient's first colonoscopy Providers:                Lorin Picket E. Tomasa Rand, MD, Stephens Shire RN, RN,                            Sunday Corn Mbumina, Technician Referring MD:              Medicines:                Monitored Anesthesia Care Complications:            No immediate complications. Estimated Blood Loss:     Estimated blood loss: none. Procedure:                Pre-Anesthesia Assessment:                           - Prior to the procedure, a History and Physical                            was performed, and patient medications and                            allergies were reviewed. The patient's tolerance of                            previous anesthesia was also reviewed. The risks                            and benefits of the procedure and the sedation                            options and risks were discussed with the patient.                            All questions were answered, and informed consent                            was obtained. Prior Anticoagulants: The patient has                            taken no anticoagulant or antiplatelet agents. ASA                            Grade Assessment: III - A patient with severe  systemic disease. After reviewing the risks and                            benefits, the patient was deemed in satisfactory                            condition to undergo the procedure.                           After obtaining informed consent, the colonoscope                            was passed under direct vision. Throughout the                             procedure, the patient's blood pressure, pulse, and                            oxygen saturations were monitored continuously. The                            CF-HQ190L ([id number]) Olympus colonoscope was                            introduced through the anus and advanced to the the                            cecum, identified by appendiceal orifice and                            ileocecal valve. The colonoscopy was performed                            without difficulty. The patient tolerated the                            procedure well. The quality of the bowel                            preparation was good. The ileocecal valve,                            appendiceal orifice, and rectum were photographed. Scope In: 7:40:27 AM Scope Out: 7:58:56 AM Scope Withdrawal Time: 0 hours 13 minutes 10 seconds  Total Procedure Duration: 0 hours 18 minutes 29 seconds  Findings:      Hemorrhoids were found on perianal exam.      The digital rectal exam was normal. Pertinent negatives include normal       sphincter tone and no palpable rectal lesions.      A 12 mm polyp was found in the sigmoid colon. The polyp was       pedunculated. The polyp was removed with a hot snare. Resection and       retrieval were complete. Estimated blood loss: none.      A few medium-mouthed and small-mouthed  diverticula were found in the       sigmoid colon. There was no evidence of diverticular bleeding.      The exam was otherwise normal throughout the examined colon.      The retroflexed view of the distal rectum and anal verge was normal and       showed no anal or rectal abnormalities. Impression:               - Hemorrhoids found on perianal exam.                           - One 12 mm polyp in the sigmoid colon, removed                            with a hot snare. Resected and retrieved.                           - Mild diverticulosis in the sigmoid colon. There                            was no evidence of  diverticular bleeding.                           - The distal rectum and anal verge are normal on                            retroflexion view. Moderate Sedation:      Not Applicable - Patient had care per Anesthesia. Recommendation:           - Patient has a contact number available for                            emergencies. The signs and symptoms of potential                            delayed complications were discussed with the                            patient. Return to normal activities tomorrow.                            Written discharge instructions were provided to the                            patient.                           - Resume previous diet.                           - Continue present medications.                           - Await pathology results.                           -  Repeat colonoscopy (date not yet determined) for                            surveillance based on pathology results. Procedure Code(s):        --- Professional ---                           [phone], Colonoscopy, flexible; with removal of                            tumor(s), polyp(s), or other lesion(s) by snare                            technique Diagnosis Code(s):        --- Professional ---                           Z12.11, Encounter for screening for malignant                            neoplasm of colon                           D12.5, Benign neoplasm of sigmoid colon                           K64.9, Unspecified hemorrhoids                           K57.30, Diverticulosis of large intestine without                            perforation or abscess without bleeding CPT copyright 2022 American Medical Association. All rights reserved. The codes documented in this report are preliminary and upon coder review may  be revised to meet current compliance requirements. Giovonni Poirier E. Tomasa Rand, MD 09/13/2023 8:05:53 AM This report has been signed electronically. Number of Addenda: 0

## 2023-09-13 NOTE — Anesthesia Postprocedure Evaluation (Signed)
 Anesthesia Post Note  Patient: [name]  Procedure(s) Performed: COLONOSCOPY WITH PROPOFOL POLYPECTOMY     Patient location during evaluation: PACU Anesthesia Type: MAC Level of consciousness: awake and alert Pain management: pain level controlled Vital Signs Assessment: post-procedure vital signs reviewed and stable Respiratory status: spontaneous breathing, nonlabored ventilation and respiratory function stable Cardiovascular status: stable and blood pressure returned to baseline Anesthetic complications: no   No notable events documented.  Last Vitals:  Vitals:   09/13/23 0803 09/13/23 0810  BP: 119/89 133/89  Pulse: 86 81  Resp: (!) 21 20  Temp: 36.5 C   SpO2: 96% 96%    Last Pain:  Vitals:   09/13/23 0803  TempSrc: Oral  PainSc: 0-No pain                 Beryle Lathe

## 2023-09-13 NOTE — Transfer of Care (Signed)
 Immediate Anesthesia Transfer of Care Note  Patient: [name]  Procedure(s) Performed: COLONOSCOPY WITH PROPOFOL POLYPECTOMY  Patient Location: PACU and Endoscopy Unit  Anesthesia Type:MAC  Level of Consciousness: drowsy  Airway & Oxygen Therapy: Patient Spontanous Breathing  Post-op Assessment: Report given to RN and Post -op Vital signs reviewed and stable  Post vital signs: Reviewed and stable  Last Vitals:  Vitals Value Taken Time  BP    Temp    Pulse    Resp    SpO2      Last Pain:  Vitals:   09/13/23 0642  TempSrc: Tympanic  PainSc: 0-No pain         Complications: No notable events documented.

## 2023-09-13 NOTE — H&P (Signed)
 [facility] History and Physical   Primary Care Physician:  Diamantina Providence, FNP   Reason for Procedure:   Colon cancer screening  Plan:    Screening colonoscopy     HPI: [name] is a 48 y.o. female undergoing initial average risk screening colonoscopy.  She has no family history of colon cancer and no chronic GI symptoms.    Past Medical History:  Diagnosis Date   Anemia    Asthma    Blood transfusion without reported diagnosis    Environmental allergies    Fibroid, uterine    Fibroids    GERD (gastroesophageal reflux disease)    Hyperlipidemia    Hypertension    no meds   Menorrhagia    Seasonal allergies    chronic   Shortness of breath    Sleep apnea    does not use CPAP    Past Surgical History:  Procedure Laterality Date   ABDOMINAL HYSTERECTOMY Bilateral 02/08/2018   Procedure: HYSTERECTOMY ABDOMINAL WITH LEFT SALPINGO-OOPORECTOMY AND RIGHT SALPINGECTOMY;  Surgeon: Willodean Rosenthal, MD;  Location: [facility];  Service: Gynecology;  Laterality: Bilateral;   CHOLECYSTECTOMY     06/29/2011   CHOLECYSTECTOMY  06/29/2011   Procedure: LAPAROSCOPIC CHOLECYSTECTOMY WITH INTRAOPERATIVE CHOLANGIOGRAM;  Surgeon: Robyne Askew, MD;  Location: [facility] OR;  Service: General;  Laterality: N/A;   ECTOPIC PREGNANCY SURGERY     Laparoscopic   ERCP  06/30/2011   Procedure: ENDOSCOPIC RETROGRADE CHOLANGIOPANCREATOGRAPHY (ERCP);  Surgeon: Petra Kuba, MD;  Location: [facility] ENDOSCOPY;  Service: Endoscopy;  Laterality: N/A;  probable sphincterotomy   ERCP  07/03/2011   Procedure: ENDOSCOPIC RETROGRADE CHOLANGIOPANCREATOGRAPHY (ERCP);  Surgeon: Freddy Jaksch, MD;  Location: [facility] OR;  Service: Endoscopy;  Laterality: N/A;  PROPOFOL   MYOMECTOMY  2014   MYOMECTOMY N/A 11/14/2012   Procedure: MYOMECTOMY;  Surgeon: Tereso Newcomer, MD;  Location: [facility];  Service: Gynecology;  Laterality: N/A;   NASAL POLYP EXCISION  2016   in [place]   WISDOM TOOTH EXTRACTION       Prior to Admission medications   Medication Sig Start Date End Date Taking? Authorizing Provider  amLODipine (NORVASC) 10 MG tablet Take 10 mg by mouth daily. 11/17/19  Yes [provider]  budesonide-formoterol (SYMBICORT) 160-4.5 MCG/ACT inhaler Inhale 2 puffs into the lungs 2 (two) times daily. 01/15/21  Yes Elenore Paddy, NP  Dupilumab (DUPIXENT [place]) Inject 1 Syringe into the skin every 14 (fourteen) days.   Yes [provider]  rosuvastatin (CRESTOR) 10 MG tablet Take 1 tablet (10 mg total) by mouth at bedtime. 04/06/23  Yes   triamterene-hydrochlorothiazide (MAXZIDE-25) 37.5-25 MG tablet Take 1 tablet by mouth daily. 12/04/20  Yes Gosrani, Nimish C, MD  acetaminophen (TYLENOL) 500 MG tablet Take 1 tablet (500 mg total) by mouth every 6 (six) hours as needed. Patient not taking: Reported on 11/26/2022 11/15/22   Redwine, Madison A, PA-C  albuterol (VENTOLIN HFA) 108 (90 Base) MCG/ACT inhaler Inhale 2 puffs into the lungs every 6 (six) hours as needed for wheezing or shortness of breath. 11/05/22   [place], Alvira Philips, DO  budesonide-formoterol (SYMBICORT) 160-4.5 MCG/ACT inhaler Inhale 2 puffs into the lungs 2 (two) times daily. 04/06/23     EPINEPHrine 0.3 mg/0.3 mL IJ SOAJ injection Inject 0.3 mg into the muscle once as needed for up to 1 dose. 02/16/23     loratadine (CLARITIN) 10 MG tablet Take 1 tablet (10 mg total) by mouth daily. Patient  not taking: Reported on 09/03/2023 02/16/23     losartan (COZAAR) 25 MG tablet Take 25 mg by mouth daily. Patient not taking: Reported on 11/26/2022    [provider]  ondansetron (ZOFRAN-ODT) 4 MG disintegrating tablet Place 2 tablets (8 mg total) under the tongue 3 (three) times daily as needed for nausea for 14 days. 04/06/23     Semaglutide-Weight Management (WEGOVY) 2.4 MG/0.75ML SOAJ Inject 2.4 mg into the skin once a week. Patient not taking: Reported on 09/03/2023 08/24/23   [provider]  Semaglutide-Weight Management 0.25  MG/0.5ML SOAJ Inject 0.25 mg into the skin once a week. Patient not taking: Reported on 09/03/2023 04/05/23     Semaglutide-Weight Management 0.5 MG/0.5ML SOAJ Inject 0.5 mg into the skin once a week. 04/05/23     triamterene-hydrochlorothiazide (MAXZIDE-25) 37.5-25 MG tablet Take 1 tablet by mouth daily. 04/06/23       Current Facility-Administered Medications  Medication Dose Route Frequency Provider Last Rate Last Admin   0.9 %  sodium chloride infusion   Intravenous Continuous Jenel Lucks, MD        Allergies as of 07/22/2023 - Review Complete 04/21/2023  Allergen Reaction Noted   Bee venom Anaphylaxis 05/26/2011   Prednisone Hives, Swelling, Itching, Nausea Only, and Rash 05/26/2011   Naproxen Hives 04/17/2023    Family History  Problem Relation Age of Onset   Hypertension Mother    Diabetes Mother    Hypertension Father    Hypertension Brother    Anesthesia problems Neg Hx    Hypotension Neg Hx    Malignant hyperthermia Neg Hx    Pseudochol deficiency Neg Hx    Colon cancer Neg Hx    Rectal cancer Neg Hx    Stomach cancer Neg Hx    Esophageal cancer Neg Hx     Social History   Socioeconomic History   Marital status: Married    Spouse name: Not on file   Number of children: Not on file   Years of education: Not on file   Highest education level: Not on file  Occupational History   Not on file  Tobacco Use   Smoking status: Never   Smokeless tobacco: Never  Vaping Use   Vaping status: Never Used  Substance and Sexual Activity   Alcohol use: Yes    Comment: occasional   Drug use: No   Sexual activity: Yes    Birth control/protection: None  Other Topics Concern   Not on file  Social History Narrative   Not on file   Social Drivers of Health   Financial Resource Strain: Low Risk  (09/06/2023)   Received from [facility]   Overall Financial Resource Strain (CARDIA)    Difficulty of Paying Living Expenses: Not hard at all  Food Insecurity: No Food  Insecurity (09/06/2023)   Received from [facility]   Hunger Vital Sign    Worried About Running Out of Food in the Last Year: Never true    Ran Out of Food in the Last Year: Never true  Transportation Needs: No Transportation Needs (09/06/2023)   Received from [facility] - Transportation    Lack of Transportation (Medical): No    Lack of Transportation (Non-Medical): No  Physical Activity: Insufficiently Active (01/15/2023)   Received from [facility]   Exercise Vital Sign    Days of Exercise per Week: 3 days    Minutes of Exercise per Session: 30 min  Stress: No Stress Concern  Present (02/08/2023)   Received from [facility] - Occupational Stress Questionnaire    Feeling of Stress : Not at all  Social Connections: Socially Integrated (01/15/2023)   Received from [facility]   Social Network    How would you rate your social network (family, work, friends)?: Good participation with social networks  Intimate Partner Violence: Not At Risk (02/08/2023)   Received from [facility]   HITS    Over the last 12 months how often did your partner physically hurt you?: Never    Over the last 12 months how often did your partner insult you or talk down to you?: Never    Over the last 12 months how often did your partner threaten you with physical harm?: Never    Over the last 12 months how often did your partner scream or curse at you?: Never    Review of Systems:  All other review of systems negative except as mentioned in the HPI.  Physical Exam: Vital signs BP (!) 156/104   Pulse 94   Temp (!) 97.5 F (36.4 C) (Tympanic)   Resp 16   Ht 5\' 2"  (1.575 m)   Wt 124.7 kg   LMP 12/19/2017   SpO2 97%   BMI 50.30 kg/m   General:   Alert,  Well-developed, well-nourished, pleasant and cooperative in NAD Airway:  Mallampati 2 Lungs:  Clear throughout to auscultation.   Heart:  Regular rate and rhythm; no murmurs, clicks, rubs,   or gallops. Abdomen:  Soft, nontender and nondistended. Normal bowel sounds.   Neuro/Psych:  Normal mood and affect. A and O x 3   Isham Smitherman E. Tomasa Rand, MD [facility]

## 2023-09-14 LAB — SURGICAL PATHOLOGY

## 2023-09-15 ENCOUNTER — Encounter (HOSPITAL_COMMUNITY): Payer: Self-pay | Admitting: Gastroenterology

## 2023-09-15 ENCOUNTER — Ambulatory Visit
Admission: RE | Admit: 2023-09-15 | Discharge: 2023-09-15 | Disposition: A | Discharge status: Home / Self Care | Source: Ambulatory Visit | Attending: Nurse Practitioner | Admitting: Nurse Practitioner

## 2023-09-15 ENCOUNTER — Encounter: Payer: Self-pay | Admitting: Gastroenterology

## 2023-09-15 DIAGNOSIS — Z1231 Encounter for screening mammogram for malignant neoplasm of breast: Secondary | ICD-10-CM

## 2023-09-15 NOTE — Progress Notes (Signed)
 [name],  The polyp that I removed during your recent procedure was completely benign but was proven to be a "pre-cancerous" polyp that MAY have grown into cancers if it had not been removed.  Studies shows that at least 20% of women over age 48 and 30% of men over age 50 have pre-cancerous polyps.  Based on current nationally recognized surveillance guidelines, I recommend that you have a repeat colonoscopy in 3 years.   If you develop any new rectal bleeding, abdominal pain or significant bowel habit changes, please contact me before then.

## 2023-10-18 ENCOUNTER — Encounter (HOSPITAL_COMMUNITY): Payer: Self-pay

## 2023-10-18 ENCOUNTER — Inpatient Hospital Stay (HOSPITAL_COMMUNITY)
Admission: EM | Admit: 2023-10-18 | Discharge: 2023-10-22 | DRG: 202 | Disposition: A | Discharge status: Home / Self Care | Attending: Internal Medicine | Admitting: Internal Medicine

## 2023-10-18 ENCOUNTER — Emergency Department (HOSPITAL_COMMUNITY)

## 2023-10-18 DIAGNOSIS — E7800 Pure hypercholesterolemia, unspecified: Secondary | ICD-10-CM | POA: Diagnosis present

## 2023-10-18 DIAGNOSIS — Z91030 Bee allergy status: Secondary | ICD-10-CM

## 2023-10-18 DIAGNOSIS — Z7951 Long term (current) use of inhaled steroids: Secondary | ICD-10-CM

## 2023-10-18 DIAGNOSIS — E662 Morbid (severe) obesity with alveolar hypoventilation: Secondary | ICD-10-CM | POA: Diagnosis present

## 2023-10-18 DIAGNOSIS — I10 Essential (primary) hypertension: Secondary | ICD-10-CM | POA: Diagnosis present

## 2023-10-18 DIAGNOSIS — Z6841 Body Mass Index (BMI) 40.0 and over, adult: Secondary | ICD-10-CM

## 2023-10-18 DIAGNOSIS — R0682 Tachypnea, not elsewhere classified: Secondary | ICD-10-CM | POA: Diagnosis present

## 2023-10-18 DIAGNOSIS — J45901 Unspecified asthma with (acute) exacerbation: Principal | ICD-10-CM | POA: Diagnosis present

## 2023-10-18 DIAGNOSIS — Z8249 Family history of ischemic heart disease and other diseases of the circulatory system: Secondary | ICD-10-CM

## 2023-10-18 DIAGNOSIS — E66813 Obesity, class 3: Secondary | ICD-10-CM | POA: Diagnosis present

## 2023-10-18 DIAGNOSIS — I495 Sick sinus syndrome: Secondary | ICD-10-CM | POA: Diagnosis present

## 2023-10-18 DIAGNOSIS — R008 Other abnormalities of heart beat: Secondary | ICD-10-CM | POA: Diagnosis present

## 2023-10-18 DIAGNOSIS — I493 Ventricular premature depolarization: Secondary | ICD-10-CM | POA: Diagnosis present

## 2023-10-18 DIAGNOSIS — Z833 Family history of diabetes mellitus: Secondary | ICD-10-CM

## 2023-10-18 DIAGNOSIS — K219 Gastro-esophageal reflux disease without esophagitis: Secondary | ICD-10-CM | POA: Diagnosis present

## 2023-10-18 DIAGNOSIS — Z888 Allergy status to other drugs, medicaments and biological substances status: Secondary | ICD-10-CM

## 2023-10-18 DIAGNOSIS — Z7985 Long-term (current) use of injectable non-insulin antidiabetic drugs: Secondary | ICD-10-CM

## 2023-10-18 DIAGNOSIS — Z79899 Other long term (current) drug therapy: Secondary | ICD-10-CM

## 2023-10-18 DIAGNOSIS — G4733 Obstructive sleep apnea (adult) (pediatric): Secondary | ICD-10-CM | POA: Diagnosis present

## 2023-10-18 DIAGNOSIS — J4550 Severe persistent asthma, uncomplicated: Secondary | ICD-10-CM | POA: Diagnosis present

## 2023-10-18 DIAGNOSIS — D72829 Elevated white blood cell count, unspecified: Secondary | ICD-10-CM | POA: Diagnosis present

## 2023-10-18 DIAGNOSIS — E876 Hypokalemia: Secondary | ICD-10-CM | POA: Diagnosis present

## 2023-10-18 DIAGNOSIS — R0609 Other forms of dyspnea: Secondary | ICD-10-CM

## 2023-10-18 DIAGNOSIS — Z886 Allergy status to analgesic agent status: Secondary | ICD-10-CM

## 2023-10-18 DIAGNOSIS — E785 Hyperlipidemia, unspecified: Secondary | ICD-10-CM | POA: Diagnosis present

## 2023-10-18 DIAGNOSIS — J4551 Severe persistent asthma with (acute) exacerbation: Principal | ICD-10-CM | POA: Diagnosis present

## 2023-10-18 DIAGNOSIS — J3089 Other allergic rhinitis: Secondary | ICD-10-CM | POA: Diagnosis present

## 2023-10-18 DIAGNOSIS — R7303 Prediabetes: Secondary | ICD-10-CM | POA: Diagnosis present

## 2023-10-18 DIAGNOSIS — Z90710 Acquired absence of both cervix and uterus: Secondary | ICD-10-CM

## 2023-10-18 MED ORDER — IPRATROPIUM-ALBUTEROL 0.5-2.5 (3) MG/3ML IN SOLN
3.0000 mL | Freq: Once | RESPIRATORY_TRACT | Status: AC
  Administered 2023-10-18: 3 mL via RESPIRATORY_TRACT
  Filled 2023-10-18: qty 3

## 2023-10-18 MED ORDER — ALBUTEROL SULFATE HFA 108 (90 BASE) MCG/ACT IN AERS
2.0000 | INHALATION_SPRAY | RESPIRATORY_TRACT | Status: DC | PRN
  Administered 2023-10-18: 2 via RESPIRATORY_TRACT
  Filled 2023-10-18: qty 6.7

## 2023-10-18 NOTE — ED Triage Notes (Signed)
 Pt arrived POV for concern of asthma exacerbation, reports started [place] while [place] the beach and it has progressively gotten worse throughout the day. Pt st she was on a medication called Dupixent, but has not had medication since Feb d/t insurance. Cough related to asthma, denies CP, n/v. Pt able to speak in complete sentences, but wheezing throughout bilateral fields. HTN noted, sats 95% RA. A&O x4.

## 2023-10-19 ENCOUNTER — Other Ambulatory Visit: Payer: Self-pay

## 2023-10-19 DIAGNOSIS — R008 Other abnormalities of heart beat: Secondary | ICD-10-CM | POA: Diagnosis present

## 2023-10-19 DIAGNOSIS — I495 Sick sinus syndrome: Secondary | ICD-10-CM | POA: Diagnosis present

## 2023-10-19 DIAGNOSIS — J4551 Severe persistent asthma with (acute) exacerbation: Secondary | ICD-10-CM | POA: Diagnosis not present

## 2023-10-19 DIAGNOSIS — G4733 Obstructive sleep apnea (adult) (pediatric): Secondary | ICD-10-CM | POA: Diagnosis present

## 2023-10-19 DIAGNOSIS — E66813 Obesity, class 3: Secondary | ICD-10-CM | POA: Diagnosis present

## 2023-10-19 LAB — CBC
HCT: 40.5 % (ref 36.0–46.0)
Hemoglobin: 12.1 g/dL (ref 12.0–15.0)
MCH: 22.3 pg — ABNORMAL LOW (ref 26.0–34.0)
MCHC: 29.9 g/dL — ABNORMAL LOW (ref 30.0–36.0)
MCV: 74.6 fL — ABNORMAL LOW (ref 80.0–100.0)
Platelets: 325 10*3/uL (ref 150–400)
RBC: 5.43 MIL/uL — ABNORMAL HIGH (ref 3.87–5.11)
RDW: 16.8 % — ABNORMAL HIGH (ref 11.5–15.5)
WBC: 13.7 10*3/uL — ABNORMAL HIGH (ref 4.0–10.5)
nRBC: 0 % (ref 0.0–0.2)

## 2023-10-19 LAB — BASIC METABOLIC PANEL WITH GFR
Anion gap: 7 (ref 5–15)
BUN: 11 mg/dL (ref 6–20)
CO2: 26 mmol/L (ref 22–32)
Calcium: 9.1 mg/dL (ref 8.9–10.3)
Chloride: 103 mmol/L (ref 98–111)
Creatinine, Ser: 0.78 mg/dL (ref 0.44–1.00)
GFR, Estimated: >60 mL/min (ref >60)
Glucose, Bld: 116 mg/dL — ABNORMAL HIGH (ref 70–99)
Potassium: 3.4 mmol/L — ABNORMAL LOW (ref 3.5–5.1)
Sodium: 136 mmol/L (ref 135–145)

## 2023-10-19 LAB — GLUCOSE, CAPILLARY
Glucose-Capillary: 131 mg/dL — ABNORMAL HIGH (ref 70–99)
Glucose-Capillary: 116 mg/dL — ABNORMAL HIGH (ref 70–99)

## 2023-10-19 LAB — CBG MONITORING, ED: Glucose-Capillary: 152 mg/dL — ABNORMAL HIGH (ref 70–99)

## 2023-10-19 LAB — BRAIN NATRIURETIC PEPTIDE: B Natriuretic Peptide: 64 pg/mL (ref 0.0–100.0)

## 2023-10-19 LAB — PREGNANCY, URINE: Preg Test, Ur: NEGATIVE

## 2023-10-19 MED ORDER — GLUCAGON HCL RDNA (DIAGNOSTIC) 1 MG IJ SOLR
1.0000 mg | Freq: Once | INTRAMUSCULAR | Status: AC
  Administered 2023-10-19: 1 mg via INTRAVENOUS
  Filled 2023-10-19: qty 1

## 2023-10-19 MED ORDER — IPRATROPIUM-ALBUTEROL 0.5-2.5 (3) MG/3ML IN SOLN
3.0000 mL | Freq: Once | RESPIRATORY_TRACT | Status: AC
Start: 2023-10-19 — End: 2023-10-19
  Administered 2023-10-19: 3 mL via RESPIRATORY_TRACT
  Filled 2023-10-19: qty 3

## 2023-10-19 MED ORDER — METHYLPREDNISOLONE SODIUM SUCC 125 MG IJ SOLR
125.0000 mg | INTRAMUSCULAR | Status: AC
Start: 2023-10-19 — End: 2023-10-19
  Administered 2023-10-19: 125 mg via INTRAVENOUS
  Filled 2023-10-19: qty 2

## 2023-10-19 MED ORDER — ACETAMINOPHEN 500 MG PO TABS
1000.0000 mg | ORAL_TABLET | Freq: Once | ORAL | Status: AC
  Administered 2023-10-19: 1000 mg via ORAL
  Filled 2023-10-19: qty 2

## 2023-10-19 MED ORDER — ALBUTEROL SULFATE (2.5 MG/3ML) 0.083% IN NEBU: 2.5000 mg | INHALATION_SOLUTION | RESPIRATORY_TRACT | Status: DC | PRN

## 2023-10-19 MED ORDER — ACETAMINOPHEN 650 MG RE SUPP: 650.0000 mg | Freq: Four times a day (QID) | RECTAL | Status: DC | PRN

## 2023-10-19 MED ORDER — LEVALBUTEROL HCL 1.25 MG/0.5ML IN NEBU
1.2500 mg | INHALATION_SOLUTION | Freq: Four times a day (QID) | RESPIRATORY_TRACT | Status: DC
  Administered 2023-10-19 – 2023-10-22 (×11): 1.25 mg via RESPIRATORY_TRACT
  Filled 2023-10-19 (×13): qty 0.5

## 2023-10-19 MED ORDER — ONDANSETRON HCL 4 MG PO TABS
4.0000 mg | ORAL_TABLET | Freq: Four times a day (QID) | ORAL | Status: DC | PRN
Start: 2023-10-19 — End: 2023-10-22

## 2023-10-19 MED ORDER — ROSUVASTATIN CALCIUM 10 MG PO TABS
10.0000 mg | ORAL_TABLET | Freq: Every day | ORAL | Status: DC
  Administered 2023-10-19 – 2023-10-21 (×3): 10 mg via ORAL
  Filled 2023-10-19 (×3): qty 1

## 2023-10-19 MED ORDER — LOSARTAN POTASSIUM 50 MG PO TABS
25.0000 mg | ORAL_TABLET | Freq: Every day | ORAL | Status: DC
  Filled 2023-10-19: qty 1

## 2023-10-19 MED ORDER — ACETAMINOPHEN 325 MG PO TABS
650.0000 mg | ORAL_TABLET | Freq: Four times a day (QID) | ORAL | Status: DC | PRN
  Administered 2023-10-19: 650 mg via ORAL
  Filled 2023-10-19: qty 2

## 2023-10-19 MED ORDER — IPRATROPIUM-ALBUTEROL 0.5-2.5 (3) MG/3ML IN SOLN
3.0000 mL | Freq: Four times a day (QID) | RESPIRATORY_TRACT | Status: DC
  Administered 2023-10-19: 3 mL via RESPIRATORY_TRACT
  Filled 2023-10-19: qty 3

## 2023-10-19 MED ORDER — POTASSIUM CHLORIDE CRYS ER 20 MEQ PO TBCR
40.0000 meq | EXTENDED_RELEASE_TABLET | Freq: Once | ORAL | Status: AC
  Administered 2023-10-19: 40 meq via ORAL
  Filled 2023-10-19: qty 2

## 2023-10-19 MED ORDER — MAGNESIUM SULFATE 2 GM/50ML IV SOLN
2.0000 g | Freq: Once | INTRAVENOUS | Status: AC
  Administered 2023-10-19: 2 g via INTRAVENOUS
  Filled 2023-10-19: qty 50

## 2023-10-19 MED ORDER — ONDANSETRON HCL 4 MG/2ML IJ SOLN: 4.0000 mg | Freq: Four times a day (QID) | INTRAMUSCULAR | Status: DC | PRN

## 2023-10-19 MED ORDER — METHYLPREDNISOLONE SODIUM SUCC 40 MG IJ SOLR
40.0000 mg | INTRAMUSCULAR | Status: DC
  Administered 2023-10-20: 40 mg via INTRAVENOUS
  Filled 2023-10-19: qty 1

## 2023-10-19 MED ORDER — TRIAMTERENE-HCTZ 37.5-25 MG PO TABS
1.0000 | ORAL_TABLET | Freq: Every day | ORAL | Status: DC
  Administered 2023-10-19 – 2023-10-22 (×4): 1 via ORAL
  Filled 2023-10-19 (×5): qty 1

## 2023-10-19 MED ORDER — INSULIN ASPART 100 UNIT/ML IJ SOLN
0.0000 [IU] | Freq: Three times a day (TID) | INTRAMUSCULAR | Status: DC
  Administered 2023-10-19: 3 [IU] via SUBCUTANEOUS
  Administered 2023-10-21 (×2): 4 [IU] via SUBCUTANEOUS
  Administered 2023-10-22: 3 [IU] via SUBCUTANEOUS
  Filled 2023-10-19: qty 0.2

## 2023-10-19 MED ORDER — METHYLPREDNISOLONE SODIUM SUCC 40 MG IJ SOLR: 40.0000 mg | INTRAMUSCULAR | Status: DC

## 2023-10-19 MED ORDER — ENOXAPARIN SODIUM 80 MG/0.8ML IJ SOSY
65.0000 mg | PREFILLED_SYRINGE | INTRAMUSCULAR | Status: DC
  Administered 2023-10-19 – 2023-10-21 (×3): 65 mg via SUBCUTANEOUS
  Filled 2023-10-19 (×3): qty 0.8

## 2023-10-19 MED ORDER — AMLODIPINE BESYLATE 10 MG PO TABS
5.0000 mg | ORAL_TABLET | Freq: Every day | ORAL | Status: DC
  Administered 2023-10-19 – 2023-10-22 (×4): 5 mg via ORAL
  Filled 2023-10-19 (×5): qty 1

## 2023-10-19 MED ORDER — METOPROLOL TARTRATE 5 MG/5ML IV SOLN
5.0000 mg | Freq: Once | INTRAVENOUS | Status: AC
  Administered 2023-10-19: 5 mg via INTRAVENOUS
  Filled 2023-10-19: qty 5

## 2023-10-19 MED ORDER — POTASSIUM CHLORIDE 20 MEQ PO PACK
20.0000 meq | PACK | Freq: Once | ORAL | Status: AC
  Administered 2023-10-19: 20 meq via ORAL
  Filled 2023-10-19: qty 1

## 2023-10-19 MED ORDER — ALBUTEROL SULFATE (2.5 MG/3ML) 0.083% IN NEBU
10.0000 mg/h | INHALATION_SOLUTION | Freq: Once | RESPIRATORY_TRACT | Status: AC
  Administered 2023-10-19: 10 mg/h via RESPIRATORY_TRACT
  Filled 2023-10-19: qty 12

## 2023-10-19 MED ORDER — GUAIFENESIN ER 600 MG PO TB12
600.0000 mg | ORAL_TABLET | Freq: Two times a day (BID) | ORAL | Status: DC
  Administered 2023-10-19 – 2023-10-22 (×7): 600 mg via ORAL
  Filled 2023-10-19 (×7): qty 1

## 2023-10-19 MED ORDER — DIPHENHYDRAMINE HCL 25 MG PO CAPS
25.0000 mg | ORAL_CAPSULE | Freq: Once | ORAL | Status: AC
  Administered 2023-10-19: 25 mg via ORAL
  Filled 2023-10-19: qty 1

## 2023-10-19 NOTE — ED Notes (Signed)
 This RN notified patient of upcoming insulin medication due at 12pm. Patient states that she has never taken insulin and is not diabetic. Patient states that she would not like to receive the insulin.

## 2023-10-19 NOTE — Plan of Care (Signed)

## 2023-10-19 NOTE — H&P (Signed)
 History and Physical    Patient: [name] ZOX:[id number] DOB: Feb 27, 1976 DOA: 10/18/2023 DOS: the patient was seen and examined on 10/19/2023 PCP: Diamantina Providence, FNP  Patient coming from: Home  Chief Complaint:  Chief Complaint  Patient presents with   Asthma   HPI: [name] is a 48 y.o. female with medical history significant of asthma, allergies, fibroids, menorrhagia, iron deficiency anemia, GERD, hyperlipidemia, hypertension, sleep apnea not on CPAP who presented to the emergency department complaints of progressively worse dyspnea for the past 2 to 3 days.  She has been being able to use her regular treatment regimen, particularly Dupixent which was controlling her atopic symptoms. He denied fever, chills, rhinorrhea, sore throat, wheezing or hemoptysis.  No chest pain, palpitations, diaphoresis, PND, orthopnea or pitting edema of the lower extremities.  No abdominal pain, nausea, emesis, diarrhea, constipation, melena or hematochezia.  No flank pain, dysuria, frequency or hematuria.  No polyuria, polydipsia, polyphagia or blurred vision.   Lab work: CBC Gerrard count 13.7, hemoglobin 12.1 g/dL with an MCV of 81.1 fL and platelets 325.  Normal BMP and urine pregnancy test.  BMP showed a potassium of 3.4 mmol/L and glucose of 116 mg/dL, the rest of the BMP measurements were normal.  Imaging: 2 view chest radiograph with normal cardiomediastinal contours but there was mild bronchial thickening.  ED course: Initial vital signs were temperature 98.2 F, pulse 107, respiratory rate 26, BP 190/126 mmHg and O2 sat 98% on room air.  The patient received Solu-Medrol 125 mg IVP, and albuterol 10 mg continuous neb, diphenhydramine 25 mg p.o. x 1, 2 DuoNebs, magnesium sulfate 2 g IVPB, oral potassium supplementation, acetaminophen 1000 mg p.o. x 1.   Review of Systems: As mentioned in the history of present illness. All other systems reviewed and are negative.  Past Medical History:   Diagnosis Date   Anemia    Asthma    Blood transfusion without reported diagnosis    Environmental allergies    Fibroid, uterine    Fibroids    GERD (gastroesophageal reflux disease)    Hyperlipidemia    Hypertension    no meds   Menorrhagia    Seasonal allergies    chronic   Shortness of breath    Sleep apnea    does not use CPAP   Past Surgical History:  Procedure Laterality Date   ABDOMINAL HYSTERECTOMY Bilateral 02/08/2018   Procedure: HYSTERECTOMY ABDOMINAL WITH LEFT SALPINGO-OOPORECTOMY AND RIGHT SALPINGECTOMY;  Surgeon: Willodean Rosenthal, MD;  Location: [facility];  Service: Gynecology;  Laterality: Bilateral;   CHOLECYSTECTOMY     06/29/2011   CHOLECYSTECTOMY  06/29/2011   Procedure: LAPAROSCOPIC CHOLECYSTECTOMY WITH INTRAOPERATIVE CHOLANGIOGRAM;  Surgeon: Robyne Askew, MD;  Location: [facility] OR;  Service: General;  Laterality: N/A;   COLONOSCOPY WITH PROPOFOL N/A 09/13/2023   Procedure: COLONOSCOPY WITH PROPOFOL;  Surgeon: Jenel Lucks, MD;  Location: [facility] ENDOSCOPY;  Service: Gastroenterology;  Laterality: N/A;   ECTOPIC PREGNANCY SURGERY     Laparoscopic   ERCP  06/30/2011   Procedure: ENDOSCOPIC RETROGRADE CHOLANGIOPANCREATOGRAPHY (ERCP);  Surgeon: Petra Kuba, MD;  Location: [facility] ENDOSCOPY;  Service: Endoscopy;  Laterality: N/A;  probable sphincterotomy   ERCP  07/03/2011   Procedure: ENDOSCOPIC RETROGRADE CHOLANGIOPANCREATOGRAPHY (ERCP);  Surgeon: Freddy Jaksch, MD;  Location: [facility] OR;  Service: Endoscopy;  Laterality: N/A;  PROPOFOL   MYOMECTOMY  2014   MYOMECTOMY N/A 11/14/2012   Procedure: MYOMECTOMY;  Surgeon: Tereso Newcomer, MD;  Location:  [facility];  Service: Gynecology;  Laterality: N/A;   NASAL POLYP EXCISION  2016   in [place]   POLYPECTOMY  09/13/2023   Procedure: POLYPECTOMY;  Surgeon: Jenel Lucks, MD;  Location: [facility] ENDOSCOPY;  Service: Gastroenterology;;   WISDOM TOOTH EXTRACTION     Social History:  reports that she has never smoked. She  has never used smokeless tobacco. She reports current alcohol use. She reports that she does not use drugs.  Allergies  Allergen Reactions   Bee Venom Anaphylaxis    Patient has an epi pen, also has not been stung before but was noted to be allergic due to allergy testing    Prednisone Hives, Swelling, Itching, Nausea Only and Rash    Patient has been rechallenged with prednisone and experienced the same symptoms of hives and face swelling.  She can tolerate methylprednisolone IV and PO.   Naproxen Hives    Family History  Problem Relation Age of Onset   Hypertension Mother    Diabetes Mother    Hypertension Father    Hypertension Brother    Anesthesia problems Neg Hx    Hypotension Neg Hx    Malignant hyperthermia Neg Hx    Pseudochol deficiency Neg Hx    Colon cancer Neg Hx    Rectal cancer Neg Hx    Stomach cancer Neg Hx    Esophageal cancer Neg Hx     Prior to Admission medications   Medication Sig Start Date End Date Taking? Authorizing Provider  albuterol (VENTOLIN HFA) 108 (90 Base) MCG/ACT inhaler Inhale 2 puffs into the lungs every 6 (six) hours as needed for wheezing or shortness of breath. 11/05/22  Yes [place], Eric J, DO  budesonide-formoterol (SYMBICORT) 160-4.5 MCG/ACT inhaler Inhale 2 puffs into the lungs 2 (two) times daily. 01/15/21  Yes Elenore Paddy, NP  Dupilumab (DUPIXENT [place]) Inject 1 Syringe into the skin every 14 (fourteen) days.   Yes [provider]  EPINEPHrine 0.3 mg/0.3 mL IJ SOAJ injection Inject 0.3 mg into the muscle once as needed for up to 1 dose. 02/16/23  Yes   rosuvastatin (CRESTOR) 10 MG tablet Take 1 tablet (10 mg total) by mouth at bedtime. 04/06/23  Yes   Semaglutide-Weight Management (WEGOVY) 1 MG/0.5ML SOAJ Inject 1 mg into the skin once a week.   Yes [provider]  triamterene-hydrochlorothiazide (MAXZIDE-25) 37.5-25 MG tablet Take 1 tablet by mouth daily. 12/04/20  Yes Gosrani, Nimish C, MD  fluconazole (DIFLUCAN) 150 MG  tablet Take 150 mg by mouth once. Patient not taking: Reported on 10/19/2023 10/12/23   [provider]  loratadine (CLARITIN) 10 MG tablet Take 1 tablet (10 mg total) by mouth daily. Patient not taking: Reported on 09/03/2023 02/16/23     losartan (COZAAR) 25 MG tablet Take 25 mg by mouth daily. Patient not taking: Reported on 11/26/2022    [provider]  ondansetron (ZOFRAN-ODT) 4 MG disintegrating tablet Place 2 tablets (8 mg total) under the tongue 3 (three) times daily as needed for nausea for 14 days. Patient not taking: Reported on 10/19/2023 04/06/23     Semaglutide-Weight Management (WEGOVY) 2.4 MG/0.75ML SOAJ Inject 2.4 mg into the skin once a week. Patient not taking: Reported on 09/03/2023 08/24/23   [provider]  Semaglutide-Weight Management 0.25 MG/0.5ML SOAJ Inject 0.25 mg into the skin once a week. Patient not taking: Reported on 09/03/2023 04/05/23     Semaglutide-Weight Management 0.5 MG/0.5ML SOAJ Inject 0.5 mg into the skin  once a week. Patient not taking: Reported on 10/19/2023 04/05/23     triamterene-hydrochlorothiazide (MAXZIDE-25) 37.5-25 MG tablet Take 1 tablet by mouth daily. 04/06/23       Physical Exam: Vitals:   10/18/23 2315 10/19/23 0106 10/19/23 0551 10/19/23 0923  BP:  (!) 146/103 (!) 160/91 (!) 149/101  Pulse:  96 (!) 103 (!) 105  Resp:  20 13 16   Temp:  98.7 F (37.1 C) 98.4 F (36.9 C) 98.7 F (37.1 C)  TempSrc:  Oral Oral Oral  SpO2:  100% 95% 98%  Weight: 133.4 kg     Height: 5\' 2"  (1.575 m)      Physical Exam Vitals reviewed.  Constitutional:      General: She is awake. She is not in acute distress.    Appearance: She is morbidly obese. She is ill-appearing.     Interventions: Nasal cannula in place.  HENT:     Head: Normocephalic.     Nose: No rhinorrhea.  Eyes:     General: No scleral icterus.    Pupils: Pupils are equal, round, and reactive to light.  Cardiovascular:     Rate and Rhythm: Normal rate and regular  rhythm.     Heart sounds: S1 normal and S2 normal.  Pulmonary:     Effort: Pulmonary effort is normal.     Breath sounds: Wheezing and rhonchi present. No rales.  Abdominal:     General: Bowel sounds are normal. There is no distension.     Palpations: Abdomen is soft.     Tenderness: There is no abdominal tenderness.  Musculoskeletal:     Cervical back: Neck supple.     Right lower leg: No edema.     Left lower leg: No edema.  Skin:    General: Skin is warm and dry.  Neurological:     General: No focal deficit present.     Mental Status: She is alert and oriented to person, place, and time.  Psychiatric:        Mood and Affect: Mood normal.        Behavior: Behavior normal. Behavior is cooperative.     Data Reviewed:  Results are pending, will review when available.  EKG: Vent. rate 107 BPM PR interval 143 ms QRS duration 87 ms QT/QTcB 308/411 ms P-R-T axes 60 64 -33 Sinus tachycardia LAE, consider biatrial enlargement Borderline repolarization abnormality  Assessment and Plan: Principal Problem:   Severe persistent asthma Associated with:   Perennial and seasonal allergic rhinitis   Asthma exacerbation Observation/PCU Continue supplemental oxygen. Methylprednisolone 125 mg IVP x1. Followed by Methylpred 40 mg p.o. daily in a.m. Scheduled and as needed bronchodilators. Follow-up CBC and chemistry in the morning.  Advised to follow-up closely with pulmonary as an outpatient.  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 Needs to follow-up with pulmonology. Needs to obtain a sleep study. Current BMI 53.77 kg/m. Would benefit from lifestyle modifications. Follow-up closely with PCP. Already has been seen by the bariatric clinic.    Trigeminy   Tachycardia-bradycardia (HCC) Seems to have improved. Bradycardia likely while sleeping. No beta-blockers  for now. Will obtain echocardiogram. Cardiology consult has been requested.    Essential hypertension Resume losartan 25 mg p.o. daily.    Hypokalemia Replacing.    Hyperlipidemia Continue rosuvastatin 10 mg p.o. daily.    Prediabetes Declined insulin. Continue carbohydrate modified. We will continue  to watch while on glucocorticoids.    GERD (gastroesophageal reflux disease) Antiacid, H2 blocker or PPI as needed.    Advance Care Planning:   Code Status: Full Code   Consults:   Family Communication:   Severity of Illness: The appropriate patient status for this patient is INPATIENT. Inpatient status is judged to be reasonable and necessary in order to provide the required intensity of service to ensure the patient's safety. The patient's presenting symptoms, physical exam findings, and initial radiographic and laboratory data in the context of their chronic comorbidities is felt to place them at high risk for further clinical deterioration. Furthermore, it is not anticipated that the patient will be medically stable for discharge from the hospital within 2 midnights of admission.   * I certify that at the point of admission it is my clinical judgment that the patient will require inpatient hospital care spanning beyond 2 midnights from the point of admission due to high intensity of service, high risk for further deterioration and high frequency of surveillance required.*  Author: Bobette Mo, MD 10/19/2023 9:43 AM  For on call review [email or web address].   This document was prepared using Dragon voice recognition software and may contain some unintended transcription errors.

## 2023-10-19 NOTE — Progress Notes (Addendum)
 [facility] admitting physician addendum:  The patient was seen due to having bradycardia and tachycardia ranging from the 30s to the 110s.  Earlier today, when she was in the emergency department, she was tachycardic in the 120s and 130s then subsequently showing trigeminy after receiving nebulized beta agonists.  She received potassium and magnesium supplementation.  I will order metoprolol 5 mg IVP x 1, which lowered the heart rate to the 100s, but the patient subsequently developed bradycardia 2+ hours later after arriving to the floor.  She is stated that she may have been taking a short nap, so there might be a component of sleep apnea and/or obesity hypoventilation.  She has not had any chest pain and her dyspnea symptoms are consistent with her asthma exacerbation.  She has been referred in the past to obtain a sleep study.  Not sure if such a low-dose of metoprolol with a volume of distribution of 133.4 kg may have caused the bradycardia as she may have been sleeping.  I have ordered a glucagon 1 mg IVP x 1, an echocardiogram, reach to cardiology who will see her tomorrow morning and will transfer her to the SDU for closer monitoring with BiPAP at bedtime.  Sanda Klein, MD.  Addendum:  The patient has not had any recurrence of her bradycardia.  Will be transferring to an available bed in the progressive care unit for closer monitoring.

## 2023-10-19 NOTE — ED Notes (Signed)
 O2 ambulation trial completed by this RN. Pts O2 sats were 95-96% on RA. Pts HR was 125-130. Audible inspiratory wheezing noted.

## 2023-10-19 NOTE — ED Provider Notes (Signed)
  EMERGENCY DEPARTMENT AT [facility] Provider Note   CSN: [id number] Arrival date & time: 10/18/23  2303     History  Chief Complaint  Patient presents with   Asthma    [name] is a 48 y.o. female with medical history significant for anemia, asthma, fibroids, GERD, hypertension.  The patient presents to the ED for evaluation of shortness of breath, cough and wheezing.  The patient reports that she has had cough, wheezing consistent with her past asthma exacerbations for the last 1 day.  She reports that she was recently at the beach and returned home yesterday.  She states that she has been taking Dupixent for her asthma but reports that recently her insurance agency has been giving her issues and not approving this medication.  States the last time she received it was 2 months ago.  She denies any chest pain, fevers, nausea or vomiting.  She denies any lightheadedness, dizziness or weakness on standing.  Denies any leg swelling.  Reports she has a headache from coughing so much but denies one-sided weakness or numbness, visual deficits or neck pain.  HPI     Home Medications Prior to Admission medications   Medication Sig Start Date End Date Taking? Authorizing Provider  albuterol (VENTOLIN HFA) 108 (90 Base) MCG/ACT inhaler Inhale 2 puffs into the lungs every 6 (six) hours as needed for wheezing or shortness of breath. 11/05/22  Yes [place], Eric J, DO  budesonide-formoterol (SYMBICORT) 160-4.5 MCG/ACT inhaler Inhale 2 puffs into the lungs 2 (two) times daily. 01/15/21  Yes Elenore Paddy, NP  Dupilumab (DUPIXENT [place]) Inject 1 Syringe into the skin every 14 (fourteen) days.   Yes [provider]  EPINEPHrine 0.3 mg/0.3 mL IJ SOAJ injection Inject 0.3 mg into the muscle once as needed for up to 1 dose. 02/16/23  Yes   fluconazole (DIFLUCAN) 150 MG tablet Take 150 mg by mouth once. Patient not taking: Reported on 10/19/2023 10/12/23   [provider]   rosuvastatin (CRESTOR) 10 MG tablet Take 1 tablet (10 mg total) by mouth at bedtime. 04/06/23  Yes   Semaglutide-Weight Management (WEGOVY) 1 MG/0.5ML SOAJ Inject 1 mg into the skin once a week.   Yes [provider]  Semaglutide-Weight Management 0.5 MG/0.5ML SOAJ Inject 0.5 mg into the skin once a week. Patient not taking: Reported on 10/19/2023 04/05/23     triamterene-hydrochlorothiazide (MAXZIDE-25) 37.5-25 MG tablet Take 1 tablet by mouth daily. 12/04/20  Yes Gosrani, Nimish C, MD  loratadine (CLARITIN) 10 MG tablet Take 1 tablet (10 mg total) by mouth daily. Patient not taking: Reported on 09/03/2023 02/16/23     losartan (COZAAR) 25 MG tablet Take 25 mg by mouth daily. Patient not taking: Reported on 11/26/2022    [provider]  ondansetron (ZOFRAN-ODT) 4 MG disintegrating tablet Place 2 tablets (8 mg total) under the tongue 3 (three) times daily as needed for nausea for 14 days. Patient not taking: Reported on 10/19/2023 04/06/23     Semaglutide-Weight Management (WEGOVY) 2.4 MG/0.75ML SOAJ Inject 2.4 mg into the skin once a week. Patient not taking: Reported on 09/03/2023 08/24/23   [provider]  Semaglutide-Weight Management 0.25 MG/0.5ML SOAJ Inject 0.25 mg into the skin once a week. Patient not taking: Reported on 09/03/2023 04/05/23     triamterene-hydrochlorothiazide (MAXZIDE-25) 37.5-25 MG tablet Take 1 tablet by mouth daily. 04/06/23         Allergies    Bee venom, Prednisone, and  Naproxen    Review of Systems   Review of Systems  Respiratory:  Positive for shortness of breath and wheezing.   All other systems reviewed and are negative.   Physical Exam Updated Vital Signs BP (!) 146/103 (BP Location: Right Arm)   Pulse 96   Temp 98.7 F (37.1 C) (Oral)   Resp 20   Ht 5\' 2"  (1.575 m)   Wt 133.4 kg   LMP 12/19/2017   SpO2 100%   BMI 53.77 kg/m  Physical Exam Vitals and nursing note reviewed.  Constitutional:      General: She is not in acute  distress.    Appearance: She is well-developed.  HENT:     Head: Normocephalic and atraumatic.  Eyes:     Conjunctiva/sclera: Conjunctivae normal.  Cardiovascular:     Rate and Rhythm: Regular rhythm. Tachycardia present.     Heart sounds: No murmur heard. Pulmonary:     Effort: Pulmonary effort is normal. No respiratory distress.     Breath sounds: Wheezing present.  Abdominal:     Palpations: Abdomen is soft.     Tenderness: There is no abdominal tenderness.  Musculoskeletal:        General: No swelling.     Cervical back: Neck supple.     Right lower leg: No edema.     Left lower leg: No edema.  Skin:    General: Skin is warm and dry.     Capillary Refill: Capillary refill takes less than 2 seconds.  Neurological:     Mental Status: She is alert.  Psychiatric:        Mood and Affect: Mood normal.     ED Results / Procedures / Treatments   Labs (all labs ordered are listed, but only abnormal results are displayed) Labs Reviewed  CBC - Abnormal; Notable for the following components:      Result Value   WBC 13.7 (*)    RBC 5.43 (*)    MCV 74.6 (*)    MCH 22.3 (*)    MCHC 29.9 (*)    RDW 16.8 (*)    All other components within normal limits  BASIC METABOLIC PANEL WITH GFR - Abnormal; Notable for the following components:   Potassium 3.4 (*)    Glucose, Bld 116 (*)    All other components within normal limits  BRAIN NATRIURETIC PEPTIDE  PREGNANCY, URINE    EKG None  Radiology DG Chest 2 View Result Date: 10/19/2023 CLINICAL DATA:  Asthma. EXAM: CHEST - 2 VIEW COMPARISON:  11/02/2022 FINDINGS: The cardiomediastinal contours are normal. Mild bronchial thickening. Pulmonary vasculature is normal. No consolidation, pleural effusion, or pneumothorax. No acute osseous abnormalities are seen. IMPRESSION: Mild bronchial thickening. Electronically Signed   By: Narda Rutherford M.D.   On: 10/19/2023 00:08    Procedures Procedures   Medications Ordered in  ED Medications  potassium chloride (KLOR-CON) packet 20 mEq (has no administration in time range)  ipratropium-albuterol (DUONEB) 0.5-2.5 (3) MG/3ML nebulizer solution 3 mL (3 mLs Nebulization Given 10/18/23 2321)  methylPREDNISolone sodium succinate (SOLU-MEDROL) 125 mg/2 mL injection 125 mg (125 mg Intravenous Given 10/19/23 0038)  magnesium sulfate IVPB 2 g 50 mL (0 g Intravenous Stopped 10/19/23 0135)  ipratropium-albuterol (DUONEB) 0.5-2.5 (3) MG/3ML nebulizer solution 3 mL (3 mLs Nebulization Given 10/19/23 0043)  acetaminophen (TYLENOL) tablet 1,000 mg (1,000 mg Oral Given 10/19/23 0037)  diphenhydrAMINE (BENADRYL) capsule 25 mg (25 mg Oral Given 10/19/23 0234)  albuterol (PROVENTIL) (2.5 MG/3ML) 0.083%  nebulizer solution (10 mg/hr Nebulization Given 10/19/23 0236)    ED Course/ Medical Decision Making/ A&P Clinical Course as of 10/19/23 0541  Tue Oct 19, 2023  0342 Admit for asthma exacerbation [CG]  0342 Unable to get dupixent 2/2 to insurance issues, supposed to take every 14 days, has not had in 2 months [CG]  0342 Hx of admissions to hospital 2/2 to asthma exacerbation [CG]    Clinical Course User Index [CG] Al Decant, PA-C   Medical Decision Making Amount and/or Complexity of Data Reviewed Labs: ordered. Radiology: ordered.  Risk OTC drugs. Prescription drug management. Decision regarding hospitalization.   48 year old female presents for evaluation.  Please see HPI for further details.  On examination the patient is afebrile and tachycardic.  Her lung sounds have bilateral expiratory wheezing, oxygen saturation 98% room air.  Abdomen soft and compressible.  Neurological examinations are baseline.  No edema to bilateral lower extremities.  Patient initially given DuoNeb, Solu-Medrol, magnesium.  Will collect CBC, BMP, BNP, chest x-ray and EKG.  After initial DuoNeb, patient continued to wheeze.  Patient started on 10 mg/h continuous albuterol nebulizer at this  time.  Patient CBC with leukocytosis of 13.7, baseline hemoglobin.  Patient metabolic panel potassium 3.4, no other electrolyte derangement, glucose 116, anion gap 7.  BNP 64.  Chest x-ray shows bronchial thickening, no consolidations or effusions.  EKG sinus tachycardia.  After continuous DuoNeb nausea, patient was ambulated.  Patient maintained oxygen saturation between 96 and 97% room air however did become dyspneic on exertion as well as tachycardic.  Will page for admission due to asthma exacerbation.  Discussed patient with Dr. Toniann Fail who has agreed to admit patient.  Patient amenable to plan.   Final Clinical Impression(s) / ED Diagnoses Final diagnoses:  Exacerbation of asthma, unspecified asthma severity, unspecified whether persistent  Dyspnea on exertion    Rx / DC Orders ED Discharge Orders     None         Al Decant, PA-C 10/19/23 0541    Nira Conn, MD 10/20/23 2017

## 2023-10-20 ENCOUNTER — Other Ambulatory Visit: Payer: Self-pay | Admitting: Student

## 2023-10-20 ENCOUNTER — Inpatient Hospital Stay

## 2023-10-20 ENCOUNTER — Observation Stay (HOSPITAL_COMMUNITY)

## 2023-10-20 DIAGNOSIS — R008 Other abnormalities of heart beat: Secondary | ICD-10-CM

## 2023-10-20 DIAGNOSIS — I10 Essential (primary) hypertension: Secondary | ICD-10-CM

## 2023-10-20 DIAGNOSIS — Z91030 Bee allergy status: Secondary | ICD-10-CM | POA: Diagnosis not present

## 2023-10-20 DIAGNOSIS — Z79899 Other long term (current) drug therapy: Secondary | ICD-10-CM | POA: Diagnosis not present

## 2023-10-20 DIAGNOSIS — J4551 Severe persistent asthma with (acute) exacerbation: Secondary | ICD-10-CM

## 2023-10-20 DIAGNOSIS — R9431 Abnormal electrocardiogram [ECG] [EKG]: Secondary | ICD-10-CM

## 2023-10-20 DIAGNOSIS — Z7985 Long-term (current) use of injectable non-insulin antidiabetic drugs: Secondary | ICD-10-CM | POA: Diagnosis not present

## 2023-10-20 DIAGNOSIS — G4733 Obstructive sleep apnea (adult) (pediatric): Secondary | ICD-10-CM

## 2023-10-20 DIAGNOSIS — R0602 Shortness of breath: Secondary | ICD-10-CM | POA: Diagnosis not present

## 2023-10-20 DIAGNOSIS — Z90710 Acquired absence of both cervix and uterus: Secondary | ICD-10-CM | POA: Diagnosis not present

## 2023-10-20 DIAGNOSIS — R0609 Other forms of dyspnea: Secondary | ICD-10-CM

## 2023-10-20 DIAGNOSIS — K219 Gastro-esophageal reflux disease without esophagitis: Secondary | ICD-10-CM | POA: Diagnosis present

## 2023-10-20 DIAGNOSIS — E7800 Pure hypercholesterolemia, unspecified: Secondary | ICD-10-CM | POA: Diagnosis present

## 2023-10-20 DIAGNOSIS — R0682 Tachypnea, not elsewhere classified: Secondary | ICD-10-CM | POA: Diagnosis present

## 2023-10-20 DIAGNOSIS — D72829 Elevated white blood cell count, unspecified: Secondary | ICD-10-CM | POA: Diagnosis present

## 2023-10-20 DIAGNOSIS — Z7951 Long term (current) use of inhaled steroids: Secondary | ICD-10-CM | POA: Diagnosis not present

## 2023-10-20 DIAGNOSIS — I493 Ventricular premature depolarization: Secondary | ICD-10-CM | POA: Diagnosis present

## 2023-10-20 DIAGNOSIS — E66813 Obesity, class 3: Secondary | ICD-10-CM | POA: Diagnosis present

## 2023-10-20 DIAGNOSIS — E876 Hypokalemia: Secondary | ICD-10-CM | POA: Diagnosis present

## 2023-10-20 DIAGNOSIS — Z888 Allergy status to other drugs, medicaments and biological substances status: Secondary | ICD-10-CM | POA: Diagnosis not present

## 2023-10-20 DIAGNOSIS — Z833 Family history of diabetes mellitus: Secondary | ICD-10-CM | POA: Diagnosis not present

## 2023-10-20 DIAGNOSIS — I495 Sick sinus syndrome: Secondary | ICD-10-CM | POA: Diagnosis present

## 2023-10-20 DIAGNOSIS — Z8249 Family history of ischemic heart disease and other diseases of the circulatory system: Secondary | ICD-10-CM | POA: Diagnosis not present

## 2023-10-20 DIAGNOSIS — R7303 Prediabetes: Secondary | ICD-10-CM | POA: Diagnosis present

## 2023-10-20 DIAGNOSIS — Z6841 Body Mass Index (BMI) 40.0 and over, adult: Secondary | ICD-10-CM | POA: Diagnosis not present

## 2023-10-20 DIAGNOSIS — Z886 Allergy status to analgesic agent status: Secondary | ICD-10-CM | POA: Diagnosis not present

## 2023-10-20 LAB — COMPREHENSIVE METABOLIC PANEL WITH GFR
ALT: 18 U/L (ref 0–44)
AST: 20 U/L (ref 15–41)
Albumin: 3.4 g/dL — ABNORMAL LOW (ref 3.5–5.0)
Alkaline Phosphatase: 57 U/L (ref 38–126)
Anion gap: 10 (ref 5–15)
BUN: 10 mg/dL (ref 6–20)
CO2: 20 mmol/L — ABNORMAL LOW (ref 22–32)
Calcium: 9.3 mg/dL (ref 8.9–10.3)
Chloride: 106 mmol/L (ref 98–111)
Creatinine, Ser: 0.6 mg/dL (ref 0.44–1.00)
GFR, Estimated: >60 mL/min (ref >60)
Glucose, Bld: 96 mg/dL (ref 70–99)
Potassium: 3.9 mmol/L (ref 3.5–5.1)
Sodium: 136 mmol/L (ref 135–145)
Total Bilirubin: 0.6 mg/dL (ref 0.0–1.2)
Total Protein: 7 g/dL (ref 6.5–8.1)

## 2023-10-20 LAB — ECHOCARDIOGRAM COMPLETE
Area-P 1/2: 4.08 cm2
Calc EF: 81.7 %
Height: 62 in
S' Lateral: 2.8 cm
Single Plane A2C EF: 84.4 %
Single Plane A4C EF: 79.2 %
Weight: 4704 [oz_av]

## 2023-10-20 LAB — TSH: TSH: 0.358 u[IU]/mL (ref 0.350–4.500)

## 2023-10-20 LAB — CBC
HCT: 40.7 % (ref 36.0–46.0)
Hemoglobin: 12.3 g/dL (ref 12.0–15.0)
MCH: 22.3 pg — ABNORMAL LOW (ref 26.0–34.0)
MCHC: 30.2 g/dL (ref 30.0–36.0)
MCV: 73.7 fL — ABNORMAL LOW (ref 80.0–100.0)
Platelets: 314 10*3/uL (ref 150–400)
RBC: 5.52 MIL/uL — ABNORMAL HIGH (ref 3.87–5.11)
RDW: 17.4 % — ABNORMAL HIGH (ref 11.5–15.5)
WBC: 22.5 10*3/uL — ABNORMAL HIGH (ref 4.0–10.5)
nRBC: 0 % (ref 0.0–0.2)

## 2023-10-20 LAB — GLUCOSE, CAPILLARY
Glucose-Capillary: 151 mg/dL — ABNORMAL HIGH (ref 70–99)
Glucose-Capillary: 164 mg/dL — ABNORMAL HIGH (ref 70–99)
Glucose-Capillary: 127 mg/dL — ABNORMAL HIGH (ref 70–99)

## 2023-10-20 LAB — MAGNESIUM: Magnesium: 2.5 mg/dL — ABNORMAL HIGH (ref 1.7–2.4)

## 2023-10-20 MED ORDER — FLUTICASONE FUROATE-VILANTEROL 100-25 MCG/ACT IN AEPB
1.0000 | INHALATION_SPRAY | Freq: Every day | RESPIRATORY_TRACT | Status: DC
  Administered 2023-10-20 – 2023-10-22 (×3): 1 via RESPIRATORY_TRACT
  Filled 2023-10-20: qty 28

## 2023-10-20 MED ORDER — HYDROCODONE BIT-HOMATROP MBR 5-1.5 MG/5ML PO SOLN
5.0000 mL | ORAL | Status: DC | PRN
  Administered 2023-10-20 – 2023-10-22 (×5): 5 mL via ORAL
  Filled 2023-10-20 (×5): qty 5

## 2023-10-20 MED ORDER — BUDESONIDE 0.25 MG/2ML IN SUSP: 0.2500 mg | Freq: Two times a day (BID) | RESPIRATORY_TRACT | Status: DC

## 2023-10-20 MED ORDER — METHYLPREDNISOLONE SODIUM SUCC 125 MG IJ SOLR
80.0000 mg | INTRAMUSCULAR | Status: DC
  Administered 2023-10-21 – 2023-10-22 (×2): 80 mg via INTRAVENOUS
  Filled 2023-10-20 (×2): qty 2

## 2023-10-20 MED ORDER — LORATADINE 10 MG PO TABS
10.0000 mg | ORAL_TABLET | Freq: Every day | ORAL | Status: DC
  Administered 2023-10-20 – 2023-10-22 (×3): 10 mg via ORAL
  Filled 2023-10-20 (×3): qty 1

## 2023-10-20 NOTE — Progress Notes (Signed)
 PROGRESS NOTE [name]  ZOX:[id number] DOB: [birth year] DOA: 10/18/2023 PCP: Diamantina Providence, FNP  Brief Narrative/Hospital Course: 48 year old female with known history of asthma who ran out of dupilumab for the last 1 month presents with worsening exacerbation of her asthma able to talk sentences but continues to be diffusely wheezing .  Seen in the ED, labs with mild hypokalemia mild leukocytosis AND WAS admitted for further observation of asthma exacerbation.  Chest x-ray 4/7 with mild bronchial thickening.   Subjective: Seen and examined Overnight afebrile BP stable on room air Labs stable this morning although her leukocytosis bumped likely from steroid She was having bradycardia and tachycardia 30s to 110-did receive low-dose metoprolol, but there was concern about sleep apnea and heart rate slowing down during sleep  Assessment and plan:  Severe persistent asthma with acute exacerbation Allergic rhinitis: At home on Symbicort, albuterol hfa and DUPILUMAB [place] 2 wky. Continue Solu-Medrol at 80mg  q 24gr, cont  aggressive bronchodilator, breo. Patient having continuous coughing spells continue Mucinex add Tussionex or Hycodan prn.  Leukocytosis: Chest x-ray no obvious pneumonia on 4/7.  Likely from steroid.  Afebrile.  Monitor  Hypertension: BP stable continue midodrine 5, Maxide  HLD: Continue statin    Subjective: Seen and examined, Overnight vitals/labs/events reviewed>    Assessment and Plan:  Morbid Obesity w/ Body mass index is 53.77 kg/m.: Will benefit with PCP follow-up, weight loss,healthy lifestyle and outpatient sleep eval if not done.   DVT prophylaxis:  Code Status:   Code Status: Full Code Family Communication: plan of care discussed with patient  at bedside. Patient status is: Remains hospitalized because of severity of illness Level of care: Progressive   Dispo: The patient is from: home            Anticipated disposition:  TBD  Objective: Vitals last 24 hrs: Vitals:   10/19/23 2252 10/20/23 0209 10/20/23 0706 10/20/23 0940  BP: (!) 155/99 135/80 125/71 (!) 145/86  Pulse: (!) 104 88 82 88  Resp: 18 20 20 20   Temp: 98 F (36.7 C) 99 F (37.2 C) 99 F (37.2 C) 98.5 F (36.9 C)  TempSrc: Oral   Oral  SpO2: 93% 96% 92% 96%  Weight:      Height:       Weight change:   Physical Examination: General exam: alert awake, older than stated age HEENT:Oral mucosa moist, Ear/Nose WNL grossly Respiratory system: Bilaterally noted to present with expiratory wheezing Cardiovascular system: S1 & S2 +. Gastrointestinal system: Abdomen soft, NT,ND,BS+ Nervous System: Alert, awake,following commands. Extremities: LE edema neg, moving arms and legs , warm legs Skin: No rashes,warm. MSK: Normal muscle bulk/tone.   Medications reviewed:  Scheduled Meds:  amLODipine  5 mg Oral Daily   budesonide (PULMICORT) nebulizer solution  0.25 mg Nebulization BID   enoxaparin (LOVENOX) injection  65 mg Subcutaneous Q24H   fluticasone furoate-vilanterol  1 puff Inhalation Daily   guaiFENesin  600 mg Oral BID   insulin aspart  0-20 Units Subcutaneous TID WC   levalbuterol  1.25 mg Nebulization Q6H   methylPREDNISolone (SOLU-MEDROL) injection  40 mg Intravenous Q24H   rosuvastatin  10 mg Oral QHS   triamterene-hydrochlorothiazide  1 tablet Oral Daily   Continuous Infusions:    Diet Order             Diet heart healthy/carb modified Fluid consistency: Thin  Diet effective now  No intake or output data in the 24 hours ending 10/20/23 1017 Net IO Since Admission: No IO data has been entered for this period [10/20/23 1017]  Wt Readings from Last 3 Encounters:  10/18/23 133.4 kg  09/13/23 124.7 kg  09/03/23 125.2 kg     Unresulted Labs (From admission, onward)     Start     Ordered   10/20/23 0500  Hemoglobin A1c  Tomorrow morning,   R       Comments: To assess prior glycemic control     10/19/23 0941          Data Reviewed: I have personally reviewed following labs and imaging studies ( see epic result tab) CBC: Recent Labs  Lab 10/19/23 0011 10/20/23 0351  WBC 13.7* 22.5*  HGB 12.1 12.3  HCT 40.5 40.7  MCV 74.6* 73.7*  PLT 325 314   CMP: Recent Labs  Lab 10/19/23 0011 10/20/23 0351  NA 136 136  K 3.4* 3.9  CL 103 106  CO2 26 20*  GLUCOSE 116* 96  BUN 11 10  CREATININE 0.78 0.60  CALCIUM 9.1 9.3   GFR: Estimated Creatinine Clearance: 113.2 mL/min (by C-G formula based on SCr of 0.6 mg/dL). Recent Labs  Lab 10/20/23 0351  AST 20  ALT 18  ALKPHOS 57  BILITOT 0.6  PROT 7.0  ALBUMIN 3.4*  Antimicrobials/Microbiology: Anti-infectives (From admission, onward)    None         Component Value Date/Time   SDES OB CLEAN CATCH 10/30/2009 0439   SPECREQUEST NONE 10/30/2009 0439   CULT ENTEROBACTER AEROGENES 10/30/2009 0439   REPTSTATUS 11/01/2009 FINAL 10/30/2009 0439     Radiology Studies: DG Chest 2 View Result Date: 10/19/2023 CLINICAL DATA:  Asthma. EXAM: CHEST - 2 VIEW COMPARISON:  11/02/2022 FINDINGS: The cardiomediastinal contours are normal. Mild bronchial thickening. Pulmonary vasculature is normal. No consolidation, pleural effusion, or pneumothorax. No acute osseous abnormalities are seen. IMPRESSION: Mild bronchial thickening. Electronically Signed   By: Narda Rutherford M.D.   On: 10/19/2023 00:08    LOS: 0 days   Total time spent in review of labs and imaging, patient evaluation, formulation of plan, documentation and communication with patient/family: 35 minutes  Lanae Boast, MD [facility] 10/20/2023, 10:17 AM

## 2023-10-20 NOTE — Hospital Course (Addendum)
 48 year old female with known history of asthma who ran out of dupilumab for the last 1 month presents with worsening exacerbation of her asthma able to talk sentences but continues to be diffusely wheezing .  Seen in the ED, labs with mild hypokalemia mild leukocytosis AND WAS admitted for further observation of asthma exacerbation.  Chest x-ray 4/7 with mild bronchial thickening. Managed with IV steroids.  Overall clinically improving.  Cardiology consulted due to PVC burden-plan for outpatient Zio patch Patient is slowly improving with bronchodilators systemic steroids antitussives. She feels ready for home today  Subjective: Seen and examined overnight no hypoxia doing well on room air  Feels ready for home today  Discharge Diagnoses:  Severe persistent asthma with acute exacerbation Allergic rhinitis: At home on Symbicort, albuterol hfa and DUPILUMAB [place] 2 wkly.  Treating with aggressive IV Solu-Medrol aggressive bronchodilators Breo She is able to ambulate and doing well RA and feels ready for home We will do Medrol Dosepak- albuterol hf inhaler, her symbicort was sent by her PCP already and she will cont on her injection as well Continue antitussives.    Leukocytosis: Chest x-ray no obvious pneumonia on 4/7.  Likely from steroid.  Afebrile.  Monitor  Hypertension PVC burden Bradycardia: BP stable continue amlodipine ( new) maxizide for BP. Seen by cardiology due to bradycardia in 30s-likely due to PVCs, echo showed EF 70 to 75% G1 DD, TSH normal monitor electrolytes planning for ZIO outpatient patch  HLD: Continue statin

## 2023-10-20 NOTE — Progress Notes (Signed)
 For PVC burden  Dr Duke Salvia to read  14 day heart monitor

## 2023-10-20 NOTE — Progress Notes (Signed)
  Echocardiogram 2D Echocardiogram has been performed.  [name] 10/20/2023, 2:46 PM

## 2023-10-20 NOTE — Progress Notes (Signed)
 Initial Nutrition Assessment  DOCUMENTATION CODES:   Morbid obesity  INTERVENTION:  - Diet per MD.  - Encourage intake of 3 healthful meals a day consisting of lean protein with fresh fruits and vegetables.    NUTRITION DIAGNOSIS:   Increased nutrient needs related to acute illness as evidenced by estimated needs.  GOAL:   Patient will meet greater than or equal to 90% of their needs  MONITOR:   PO intake, Labs, Weight trends  REASON FOR ASSESSMENT:   Consult Assessment of nutrition requirement/status  ASSESSMENT:   48 y.o. female with PMH significant of asthma, allergies, iron deficiency anemia, GERD, HLD, HTN, sleep apnea who presented with complaints of progressively worse dyspnea for the past 2 to 3 days. Admitted for asthma exacerbation.   RD working remotely. Attempted to reach patient via bedside telephone but no answer.   Per chart review, patient appears to have had weight loss from April 2024 to October 2024. Since that time, weight has trended back up.   Patient is documented to be consuming 100% of all meals.  Last BM PTA.  Medications reviewed and include: -  Labs reviewed:  - HA1C pending   NUTRITION - FOCUSED PHYSICAL EXAM:  Unable to obtain, RD working remotely  Diet Order:   Diet Order             Diet heart healthy/carb modified Fluid consistency: Thin  Diet effective now                   EDUCATION NEEDS:  No education needs have been identified at this time  Skin:  Skin Assessment: Reviewed RN Assessment  Last BM:  4/7  Height:  Ht Readings from Last 1 Encounters:  10/18/23 5\' 2"  (1.575 m)   Weight:  Wt Readings from Last 1 Encounters:  10/18/23 133.4 kg   Ideal Body Weight:  50 kg  BMI:  Body mass index is 53.77 kg/m.  Estimated Nutritional Needs:  Kcal:  1900-2300 kcals Protein:  105-120 grams Fluid:  >/= 1.9L    Shelle Iron RD, LDN Contact via Secure Chat.

## 2023-10-20 NOTE — Progress Notes (Unsigned)
Enrolled patient for a 14 day Zio XT monitor to be mailed to patients home  [place] to read

## 2023-10-20 NOTE — Consult Note (Signed)
 Cardiology Consultation   Patient ID: GWENDOLINE JUDY MRN: [id number]; DOB: 11-Feb-1976  Admit date: 10/18/2023 Date of Consult: 10/20/2023  PCP:  Diamantina Providence, FNP   [facility] Providers Cardiologist:  None        Patient Profile:   AN LANNAN is a 48 y.o. female with a hx of morbid obesity, essential hypertension, prediabetes, GERD, hypercholesterolemia, obstructive sleep apnea not on CPAP, fibroids, menorrhagia, iron deficiency anemia, asthma, allergies who is being seen 10/20/2023 for the evaluation of the bradycardia at the request of Sanda Klein MD.  History of Present Illness:   Ms. Maule Is a 48 year old female who has not been seen by cardiology.  Ms Posten presented to the emergency department on 10/18/23 for worsening shortness of breath over the past 2 to 3 days.  Patient was using her regular treatment including Dupixent to control atopic symptoms.  When arriving to the emergency department the patient was tachycardic tachypneic and hypertensive with O2 sats in the high 90s.  Patient was treated with Solu-Medrol and albuterol nebulizers, Benadryl, DuoNebs, potassium and magnesium. Was admitted to the hospitalist service for management of her asthma exacerbation  Most recent Labs showed  Potassium3.9 (04/09 0351)   Creatinine, ser  0.60 (04/09 0351) PLT  314 (04/09 0351) HGB  12.3 (04/09 0351) WBC 22.5* (04/09 0351)   Chest x-ray on 10/19/23 showed mild bronchial thickening.    Patient received 5 mg IV metoprolol for her hypertension and tachycardia.  While sleeping in the afternoon the patient became bradycardic and cardiology was consulted. These episodes of bradycardia were seen on pulse oxymetry. At about noon on 10/19/23 she had a pulse rate of 35 on pulse oxymetry and a rate of about 107 on telemetry. At 1435 on 10/19/23 she had a pulse rate of 36 on ECG and a rate of about 105 on telemetry.   On interview patient reported that her shortness of breath had  improved over the past day. The shortness of breath is worse with exertion. Denies chest pain but has abdominal pain when coughing. Had some palpitations yesterday but no lightheadedness, vision changes, dizziness, diaphoresis, nausea, vomiting.  Reported having worse swelling in left lower leg. Denies using nicotine products or drinking drinks that contain caffeine.  Reports she only drinks one ginger ale a day.   Past Medical History:  Diagnosis Date   Anemia    Asthma    Blood transfusion without reported diagnosis    Environmental allergies    Fibroid, uterine    Fibroids    GERD (gastroesophageal reflux disease)    Hyperlipidemia    Hypertension    no meds   Menorrhagia    Seasonal allergies    chronic   Shortness of breath    Sleep apnea    does not use CPAP    Past Surgical History:  Procedure Laterality Date   ABDOMINAL HYSTERECTOMY Bilateral 02/08/2018   Procedure: HYSTERECTOMY ABDOMINAL WITH LEFT SALPINGO-OOPORECTOMY AND RIGHT SALPINGECTOMY;  Surgeon: Willodean Rosenthal, MD;  Location: [facility];  Service: Gynecology;  Laterality: Bilateral;   CHOLECYSTECTOMY     06/29/2011   CHOLECYSTECTOMY  06/29/2011   Procedure: LAPAROSCOPIC CHOLECYSTECTOMY WITH INTRAOPERATIVE CHOLANGIOGRAM;  Surgeon: Robyne Askew, MD;  Location: [facility] OR;  Service: General;  Laterality: N/A;   COLONOSCOPY WITH PROPOFOL N/A 09/13/2023   Procedure: COLONOSCOPY WITH PROPOFOL;  Surgeon: Jenel Lucks, MD;  Location: [facility];  Service: Gastroenterology;  Laterality: N/A;   ECTOPIC PREGNANCY SURGERY  Laparoscopic   ERCP  06/30/2011   Procedure: ENDOSCOPIC RETROGRADE CHOLANGIOPANCREATOGRAPHY (ERCP);  Surgeon: Petra Kuba, MD;  Location: [facility] ENDOSCOPY;  Service: Endoscopy;  Laterality: N/A;  probable sphincterotomy   ERCP  07/03/2011   Procedure: ENDOSCOPIC RETROGRADE CHOLANGIOPANCREATOGRAPHY (ERCP);  Surgeon: Freddy Jaksch, MD;  Location: [facility] OR;  Service: Endoscopy;  Laterality: N/A;   PROPOFOL   MYOMECTOMY  2014   MYOMECTOMY N/A 11/14/2012   Procedure: MYOMECTOMY;  Surgeon: Tereso Newcomer, MD;  Location: [facility];  Service: Gynecology;  Laterality: N/A;   NASAL POLYP EXCISION  2016   in [place]   POLYPECTOMY  09/13/2023   Procedure: POLYPECTOMY;  Surgeon: Jenel Lucks, MD;  Location: [facility];  Service: Gastroenterology;;   WISDOM TOOTH EXTRACTION       Home Medications:  Prior to Admission medications   Medication Sig Start Date End Date Taking? Authorizing Provider  albuterol (VENTOLIN HFA) 108 (90 Base) MCG/ACT inhaler Inhale 2 puffs into the lungs every 6 (six) hours as needed for wheezing or shortness of breath. 11/05/22  Yes [place], Eric J, DO  budesonide-formoterol (SYMBICORT) 160-4.5 MCG/ACT inhaler Inhale 2 puffs into the lungs 2 (two) times daily. 01/15/21  Yes Elenore Paddy, NP  DUPILUMAB Pink Inject 2 mLs into the skin every 14 (fourteen) days. 10/14/23  Yes [provider]  loratadine (CLARITIN) 10 MG tablet Take 1 tablet (10 mg total) by mouth daily. 02/16/23  Yes   ondansetron (ZOFRAN-ODT) 4 MG disintegrating tablet Place 2 tablets (8 mg total) under the tongue 3 (three) times daily as needed for nausea for 14 days. 04/06/23  Yes   rosuvastatin (CRESTOR) 10 MG tablet Take 1 tablet (10 mg total) by mouth at bedtime. 04/06/23  Yes   Semaglutide-Weight Management (WEGOVY) 1 MG/0.5ML SOAJ Inject 1 mg into the skin once a week.   Yes [provider]  triamterene-hydrochlorothiazide (MAXZIDE-25) 37.5-25 MG tablet Take 1 tablet by mouth daily. 04/06/23  Yes   EPINEPHrine 0.3 mg/0.3 mL IJ SOAJ injection Inject 0.3 mg into the muscle once as needed for up to 1 dose. Patient not taking: Reported on 10/19/2023 02/16/23     fluconazole (DIFLUCAN) 150 MG tablet Take 150 mg by mouth once. Patient not taking: Reported on 10/19/2023 10/12/23   [provider]    Inpatient Medications: Scheduled Meds:  amLODipine  5 mg Oral Daily   enoxaparin  (LOVENOX) injection  65 mg Subcutaneous Q24H   guaiFENesin  600 mg Oral BID   insulin aspart  0-20 Units Subcutaneous TID WC   levalbuterol  1.25 mg Nebulization Q6H   methylPREDNISolone (SOLU-MEDROL) injection  40 mg Intravenous Q24H   rosuvastatin  10 mg Oral QHS   triamterene-hydrochlorothiazide  1 tablet Oral Daily   Continuous Infusions:  PRN Meds: acetaminophen **OR** acetaminophen, ondansetron **OR** ondansetron (ZOFRAN) IV  Allergies:    Allergies  Allergen Reactions   Bee Venom Anaphylaxis    Patient has an epi pen, also has not been stung before but was noted to be allergic due to allergy testing    Prednisone Hives, Itching, Nausea Only, Rash and Swelling    Patient has been rechallenged with prednisone and experienced the same symptoms of hives and face swelling.  She can tolerate methylprednisolone IV and PO.  prednisone   Losartan Potassium    Naproxen Hives    Social History:   Social History   Socioeconomic History   Marital status: Married    Spouse name: Not on  file   Number of children: Not on file   Years of education: Not on file   Highest education level: Not on file  Occupational History   Not on file  Tobacco Use   Smoking status: Never   Smokeless tobacco: Never  Vaping Use   Vaping status: Never Used  Substance and Sexual Activity   Alcohol use: Yes    Comment: occasional   Drug use: No   Sexual activity: Yes    Birth control/protection: None  Other Topics Concern   Not on file  Social History Narrative   Not on file   Social Drivers of Health   Financial Resource Strain: Low Risk  (09/06/2023)   Received from [facility]   Overall Financial Resource Strain (CARDIA)    Difficulty of Paying Living Expenses: Not hard at all  Food Insecurity: No Food Insecurity (09/06/2023)   Received from [facility]   Hunger Vital Sign    Worried About Running Out of Food in the Last Year: Never true    Ran Out of Food in the Last Year: Never  true  Transportation Needs: No Transportation Needs (09/06/2023)   Received from [facility] - Transportation    Lack of Transportation (Medical): No    Lack of Transportation (Non-Medical): No  Physical Activity: Insufficiently Active (01/15/2023)   Received from [facility]   Exercise Vital Sign    Days of Exercise per Week: 3 days    Minutes of Exercise per Session: 30 min  Stress: No Stress Concern Present (02/08/2023)   Received from [facility] - Occupational Stress Questionnaire    Feeling of Stress : Not at all  Social Connections: Socially Integrated (01/15/2023)   Received from [facility]   Social Network    How would you rate your social network (family, work, friends)?: Good participation with social networks  Intimate Partner Violence: Not At Risk (02/08/2023)   Received from [facility]   HITS    Over the last 12 months how often did your partner physically hurt you?: Never    Over the last 12 months how often did your partner insult you or talk down to you?: Never    Over the last 12 months how often did your partner threaten you with physical harm?: Never    Over the last 12 months how often did your partner scream or curse at you?: Never    Family History:    Family History  Problem Relation Age of Onset   Hypertension Mother    Diabetes Mother    Hypertension Father    Hypertension Brother    Anesthesia problems Neg Hx    Hypotension Neg Hx    Malignant hyperthermia Neg Hx    Pseudochol deficiency Neg Hx    Colon cancer Neg Hx    Rectal cancer Neg Hx    Stomach cancer Neg Hx    Esophageal cancer Neg Hx      ROS:  Please see the history of present illness.   All other ROS reviewed and negative.     Physical Exam/Data:   Vitals:   10/19/23 1927 10/19/23 1954 10/19/23 2252 10/20/23 0209  BP: (!) 152/97 (!) 152/97 (!) 155/99 135/80  Pulse: 95  (!) 104 88  Resp: 18  18 20   Temp: 98.3 F  (36.8 C)  98 F (36.7 C) 99 F (37.2 C)  TempSrc: Oral  Oral   SpO2:  97%  93% 96%  Weight:      Height:       No intake or output data in the 24 hours ending 10/20/23 0652    10/18/2023   11:15 PM 09/13/2023    6:42 AM 09/03/2023    8:00 AM  Last 3 Weights  Weight (lbs) 294 lb 275 lb 276 lb  Weight (kg) 133.358 kg 124.739 kg 125.193 kg     Body mass index is 53.77 kg/m.  General:  Well nourished, well developed, in no acute distress HEENT: normal Neck: no JVD Vascular: No carotid bruits; Distal pulses 2+ bilaterally Cardiac:  normal S1, S2; regularly irregular rhythm; no murmur Lungs:  wheezing and rhonchi loudest in the upper lobes and near the trachea  Ext: 1+ edema bilaterally Musculoskeletal:  No deformities Skin: warm and dry  Neuro:   no focal abnormalities noted Psych:  Normal affect   EKG:  The EKG was personally reviewed and demonstrates:  Sinus tachycardia with a rate of 108, frequent PVC's, right atrial enlargement, Diffuse ST changes. Telemetry:  Telemetry was personally reviewed and demonstrates:  Normal sinus rhythm with frequent pvc's and episodes of bigeminy  Relevant CV Studies: Echo pending  Laboratory Data:  High Sensitivity Troponin:  No results for input(s): "TROPONINIHS" in the last 720 hours.   Chemistry Recent Labs  Lab 10/19/23 0011 10/20/23 0351  NA 136 136  K 3.4* 3.9  CL 103 106  CO2 26 20*  GLUCOSE 116* 96  BUN 11 10  CREATININE 0.78 0.60  CALCIUM 9.1 9.3  GFRNONAA >60 >60  ANIONGAP 7 10    Recent Labs  Lab 10/20/23 0351  PROT 7.0  ALBUMIN 3.4*  AST 20  ALT 18  ALKPHOS 57  BILITOT 0.6   Lipids No results for input(s): "CHOL", "TRIG", "HDL", "LABVLDL", "LDLCALC", "CHOLHDL" in the last 168 hours.  Hematology Recent Labs  Lab 10/19/23 0011 10/20/23 0351  WBC 13.7* 22.5*  RBC 5.43* 5.52*  HGB 12.1 12.3  HCT 40.5 40.7  MCV 74.6* 73.7*  MCH 22.3* 22.3*  MCHC 29.9* 30.2  RDW 16.8* 17.4*  PLT 325 314   Thyroid No  results for input(s): "TSH", "FREET4" in the last 168 hours.  BNP Recent Labs  Lab 10/19/23 0011  BNP 64.0    DDimer No results for input(s): "DDIMER" in the last 168 hours.   Radiology/Studies:  DG Chest 2 View Result Date: 10/19/2023 CLINICAL DATA:  Asthma. EXAM: CHEST - 2 VIEW COMPARISON:  11/02/2022 FINDINGS: The cardiomediastinal contours are normal. Mild bronchial thickening. Pulmonary vasculature is normal. No consolidation, pleural effusion, or pneumothorax. No acute osseous abnormalities are seen. IMPRESSION: Mild bronchial thickening. Electronically Signed   By: Narda Rutherford M.D.   On: 10/19/2023 00:08     Assessment and Plan:   KAYNA SUPPA is a 48 y.o. female with a hx of morbid obesity, essential hypertension, prediabetes, GERD, hypercholesterolemia, sleep apnea not on CPAP, fibroids, menorrhagia, iron deficiency anemia, asthma, allergies who is being seen 10/20/2023 for the evaluation of the bradycardia at the request of Sanda Klein MD.   Asthma exacerbation Patient presented complaining of worsening shortness of breath. Was tachypneic, tachycardic, and hypertensive in the ER. was admitted for an asthma exacerbation and started on nebulizers (DuoNeb, albuterol, levalbuterol, Solu-Medrol).  Also received a dose of IV metoprolol 5 milligrams.) -- Patient reported shortness of breath is better today -- Management per primary  Frequent PVC's and episodes of trigeminy Bradycardia Cardiology was consulted due  to concerns of bradycardia. Episodes of bradycardia in the 30's were registered on pulse oxymetry while EKG rate was in the 100's. On telemetry the patient has frequent PVC's and no bradycardia. Patient denied having lightheadedness or vision changes but did report having palpitations yesterday. Denies having palpitations today. Has had palpitations with using albuterol in the past. At times feels like her heart is skipping a beat but these symptoms do not appear to bother  her much. -- The discrepancy in the rates on telemetry and pulse ox are on 10/19/23 at 1200 are because the PVC's are not detected on pulse ox. The patient received metoprolol shortly after noon and this did not appear to improve her PVC burden. -- Patient denies using nicotine products. Denies consuming drinks that contain caffeine. recommend reducing caffeine intake. -- Order TSH and Mg. -- recommend Xopenex rather than albuteral for asthma. -- Order a 2 week zio outpatient to see if patient continues to have an elevated PVC burden. -- Schedule follow up with cardiology to review findings. -- Echo pending.   Essential hypertension Initially received IV metoprolol 5 mg. Was hypertensive yesterday. Is normotensive today after triamterene/hydrochlorothiazide, and amlodipine were started.  -- Continue triamterene/hydrochlorothiazide 37.5-25 daily, and amlodipine 5mg  daily   Hyperlipidemia --Continue Crestor 10 mg daily   Obstructive sleep apnea -- Needs outpatient sleep study  Other conditions management per primary.  Risk Assessment/Risk Scores:                For questions or updates, please contact [facility] Please consult [email or web address] for contact info under    Signed, Arabella Merles, PA-C  10/20/2023 6:52 AM

## 2023-10-20 NOTE — Progress Notes (Signed)
   10/20/23 2329  BiPAP/CPAP/SIPAP  BiPAP/CPAP/SIPAP Pt Type Adult (CPAP set up at bedside, pt stated she could place herself on unit when ready for bed.)  BiPAP/CPAP/SIPAP Resmed  Mask Type Full face mask  Dentures removed? Not applicable  Mask Size Medium  FiO2 (%) 21 %  Patient Home Machine No  Patient Home Mask No  Patient Home Tubing No  Auto Titrate Yes  Minimum cmH2O 5 cmH2O  Maximum cmH2O 15 cmH2O  Device Plugged into RED Power Outlet Yes

## 2023-10-20 NOTE — Progress Notes (Signed)
   10/20/23 0000  BiPAP/CPAP/SIPAP  $ Non-Invasive Home Ventilator  Initial  $ Face Mask Medium Yes  BiPAP/CPAP/SIPAP Pt Type Adult  BiPAP/CPAP/SIPAP DREAMSTATIOND  Mask Type Full face mask  Mask Size Medium  Respiratory Rate 18 breaths/min  FiO2 (%) 21 %  Patient Home Machine No  Patient Home Mask No  Patient Home Tubing No  Auto Titrate Yes  Minimum cmH2O 5 cmH2O  Maximum cmH2O 15 cmH2O  Device Plugged into RED Power Outlet Yes

## 2023-10-21 DIAGNOSIS — I493 Ventricular premature depolarization: Secondary | ICD-10-CM | POA: Diagnosis not present

## 2023-10-21 DIAGNOSIS — G4733 Obstructive sleep apnea (adult) (pediatric): Secondary | ICD-10-CM | POA: Diagnosis not present

## 2023-10-21 DIAGNOSIS — J4551 Severe persistent asthma with (acute) exacerbation: Secondary | ICD-10-CM | POA: Diagnosis not present

## 2023-10-21 LAB — HEMOGLOBIN A1C
Hgb A1c MFr Bld: 6 % — ABNORMAL HIGH (ref 4.8–5.6)
Mean Plasma Glucose: 126 mg/dL

## 2023-10-21 LAB — GLUCOSE, CAPILLARY
Glucose-Capillary: 131 mg/dL — ABNORMAL HIGH (ref 70–99)
Glucose-Capillary: 151 mg/dL — ABNORMAL HIGH (ref 70–99)
Glucose-Capillary: 191 mg/dL — ABNORMAL HIGH (ref 70–99)
Glucose-Capillary: 147 mg/dL — ABNORMAL HIGH (ref 70–99)

## 2023-10-21 NOTE — Plan of Care (Signed)

## 2023-10-21 NOTE — Progress Notes (Signed)
 PROGRESS NOTE [name]  WUJ:[id number] DOB: 1975-11-05 DOA: 10/18/2023 PCP: [name]  Brief Narrative/Hospital Course: 48 year old female with known history of asthma who ran out of dupilumab for the last 1 month presents with worsening exacerbation of her asthma able to talk sentences but continues to be diffusely wheezing .  Seen in the ED, labs with mild hypokalemia mild leukocytosis AND WAS admitted for further observation of asthma exacerbation.  Chest x-ray 4/7 with mild bronchial thickening. Manage with IV steroids.  Overall clinically improving.  Cardiology consulted due to PVC burden  Subjective: Patient seen and examined this morning resting comfortably Overall feels much improved On room air ambulating in the hallway  Assessment and plan:  Severe persistent asthma with acute exacerbation Allergic rhinitis: At home on Symbicort, albuterol hfa and DUPILUMAB [place] 2 wkly.  Treating with aggressive IV Solu-Medrol aggressive bronchodilators Breo If able to ambulate in the hallway without any issues anticipate discharge home on Medrol Dosepak-.  Nursing reports patient get out of breath on ambulation Continue antitussives.    Leukocytosis: Chest x-ray no obvious pneumonia on 4/7.  Likely from steroid.  Afebrile.  Monitor  Hypertension PVC burden Bradycardia: BP stable continue amlodipine for BP.  Seen by cardiology due to bradycardia in 30s-likely due to PVCs, echo showed EF 70 to 75% G1 DD, TSH normal monitor electrolytes planning for ZIO outpatient patch  HLD: Continue statin   Morbid Obesity w/ Body mass index is 53.77 kg/m.: Will benefit with PCP follow-up, weight loss,healthy lifestyle and outpatient sleep eval if not done.   DVT prophylaxis:  Code Status:   Code Status: Full Code Family Communication: plan of care discussed with patient  at bedside. Patient status is: Remains hospitalized because of severity of illness Level of care: Progressive    Dispo: The patient is from: home            Anticipated disposition: TBD-anticipated discharge in next 24 hours  Objective: Vitals last 24 hrs: Vitals:   10/20/23 1036 10/20/23 2001 10/20/23 2108 10/21/23 0257  BP:   (!) 128/92   Pulse:   (!) 104   Resp:   20   Temp:   (!) 97.5 F (36.4 C)   TempSrc:   Oral   SpO2: 95% 96% 96% 97%  Weight:      Height:       Weight change:   Physical Examination: General exam: alert awake, oriented at baseline, 48 years old HEENT:Oral mucosa moist, Ear/Nose WNL grossly Respiratory system: Bilaterally air entry present but with expiratory wheezing,no use of accessory muscle Cardiovascular system: S1 & S2 +, No JVD. Gastrointestinal system: Abdomen soft,NT,ND, BS+ Nervous System: Alert, awake, moving all extremities,and following commands. Extremities: LE edema neg,distal peripheral pulses palpable and warm.  Skin: No rashes,no icterus. MSK: Normal muscle bulk,tone, power   Medications reviewed:  Scheduled Meds:  amLODipine  5 mg Oral Daily   enoxaparin (LOVENOX) injection  65 mg Subcutaneous Q24H   fluticasone furoate-vilanterol  1 puff Inhalation Daily   guaiFENesin  600 mg Oral BID   insulin aspart  0-20 Units Subcutaneous TID WC   levalbuterol  1.25 mg Nebulization Q6H   loratadine  10 mg Oral Daily   methylPREDNISolone (SOLU-MEDROL) injection  80 mg Intravenous Q24H   rosuvastatin  10 mg Oral QHS   triamterene-hydrochlorothiazide  1 tablet Oral Daily   Continuous Infusions:    Diet Order  Diet heart healthy/carb modified Fluid consistency: Thin  Diet effective now                   Intake/Output Summary (Last 24 hours) at 10/21/2023 1148 Last data filed at 10/20/2023 1300 Gross per 24 hour  Intake 120 ml  Output --  Net 120 ml   Net IO Since Admission: 240 mL [10/21/23 1148]  Wt Readings from Last 3 Encounters:  10/18/23 133.4 kg  09/13/23 124.7 kg  09/03/23 125.2 kg     Unresulted Labs  (From admission, onward)    None     Data Reviewed: I have personally reviewed following labs and imaging studies ( see epic result tab) CBC: Recent Labs  Lab 10/19/23 0011 10/20/23 0351  WBC 13.7* 22.5*  HGB 12.1 12.3  HCT 40.5 40.7  MCV 74.6* 73.7*  PLT 325 314   CMP: Recent Labs  Lab 10/19/23 0011 10/20/23 0351 10/20/23 1559  NA 136 136  --   K 3.4* 3.9  --   CL 103 106  --   CO2 26 20*  --   GLUCOSE 116* 96  --   BUN 11 10  --   CREATININE 0.78 0.60  --   CALCIUM 9.1 9.3  --   MG  --   --  2.5*   GFR: Estimated Creatinine Clearance: 113.2 mL/min (by C-G formula based on SCr of 0.6 mg/dL). Recent Labs  Lab 10/20/23 0351  AST 20  ALT 18  ALKPHOS 57  BILITOT 0.6  PROT 7.0  ALBUMIN 3.4*  Antimicrobials/Microbiology: Anti-infectives (From admission, onward)    None         Component Value Date/Time   SDES OB CLEAN CATCH 10/30/2009 0439   SPECREQUEST NONE 10/30/2009 0439   CULT ENTEROBACTER AEROGENES 10/30/2009 0439   REPTSTATUS 11/01/2009 FINAL 10/30/2009 0439     Radiology Studies: ECHOCARDIOGRAM COMPLETE Result Date: 10/20/2023    ECHOCARDIOGRAM REPORT   Patient Name:   [name] [place] Date of Exam: 10/20/2023 Medical Rec #:  [id number]      Height:       62.0 in Accession #:    [id number]     Weight:       294.0 lb Date of Birth:  05/18/1976      BSA:          2.252 m Patient Age:    48 years       BP:           145/86 mmHg Patient Gender: F              HR:           98 bpm. Exam Location:  Inpatient Procedure: 2D Echo, Cardiac Doppler and Color Doppler (Both Spectral and Color            Flow Doppler were utilized during procedure). Indications:    R94.31 Abnormal EKG; R06.02 SOB  History:        Patient has prior history of Echocardiogram examinations, most                 recent 11/03/2022. Abnormal ECG, Arrythmias:Tachycardia,                 Bradycardia and Trigeminy, Signs/Symptoms:Shortness of Breath;                 Risk Factors:Sleep Apnea,  Hypertension and Dyslipidemia. LV  gradient due to high cardiac output.  Sonographer:    Sheralyn Boatman RDCS Referring Phys: [id number] [name]  Sonographer Comments: Patient is obese and Technically difficult study due to poor echo windows. Image acquisition challenging due to patient body habitus. Patient coughing throughout exam. IMPRESSIONS  1. Left ventricular ejection fraction, by estimation, is 70 to 75%. The left ventricle has hyperdynamic function. The left ventricle has no regional wall motion abnormalities. There is mild concentric left ventricular hypertrophy. Left ventricular diastolic parameters are consistent with Grade I diastolic dysfunction (impaired relaxation).  2. Right ventricular systolic function is normal. The right ventricular size is normal. Tricuspid regurgitation signal is inadequate for assessing PA pressure.  3. The mitral valve is grossly normal. No evidence of mitral valve regurgitation. No evidence of mitral stenosis.  4. The aortic valve was not well visualized. Aortic valve regurgitation is not visualized. No aortic stenosis is present.  5. The inferior vena cava is normal in size with greater than 50% respiratory variability, suggesting right atrial pressure of 3 mmHg. Comparison(s): No significant change from prior study. FINDINGS  Left Ventricle: LV mid cavitary gradient 32 mmHg at rest. LVOT gradient 27 mmHg with Valsalva. Not consistent with significant obstruction. Left ventricular ejection fraction, by estimation, is 70 to 75%. The left ventricle has hyperdynamic function. The left ventricle has no regional wall motion abnormalities. The left ventricular internal cavity size was normal in size. There is mild concentric left ventricular hypertrophy. Left ventricular diastolic parameters are consistent with Grade I diastolic  dysfunction (impaired relaxation). Right Ventricle: The right ventricular size is normal. Right vetricular wall thickness was not well  visualized. Right ventricular systolic function is normal. Tricuspid regurgitation signal is inadequate for assessing PA pressure. Left Atrium: Left atrial size was normal in size. Right Atrium: Right atrial size was normal in size. Pericardium: Trivial pericardial effusion is present. Presence of epicardial fat layer. Mitral Valve: The mitral valve is grossly normal. No evidence of mitral valve regurgitation. No evidence of mitral valve stenosis. Tricuspid Valve: The tricuspid valve is not well visualized. Tricuspid valve regurgitation is trivial. No evidence of tricuspid stenosis. Aortic Valve: The aortic valve was not well visualized. Aortic valve regurgitation is not visualized. No aortic stenosis is present. Pulmonic Valve: The pulmonic valve was not well visualized. Pulmonic valve regurgitation is not visualized. No evidence of pulmonic stenosis. Aorta: The aortic root and ascending aorta are structurally normal, with no evidence of dilitation. Venous: The inferior vena cava is normal in size with greater than 50% respiratory variability, suggesting right atrial pressure of 3 mmHg. IAS/Shunts: No atrial level shunt detected by color flow Doppler.  LEFT VENTRICLE PLAX 2D LVIDd:         4.55 cm     Diastology LVIDs:         2.80 cm     LV e' medial:    8.05 cm/s LV PW:         1.20 cm     LV E/e' medial:  9.7 LV IVS:        1.20 cm     LV e' lateral:   7.94 cm/s LVOT diam:     2.20 cm     LV E/e' lateral: 9.8 LV SV:         90 LV SV Index:   40 LVOT Area:     3.80 cm  LV Volumes (MOD) LV vol d, MOD A2C: 58.6 ml LV vol d, MOD A4C: 77.5 ml LV  vol s, MOD A2C: 9.1 ml LV vol s, MOD A4C: 16.1 ml LV SV MOD A2C:     49.5 ml LV SV MOD A4C:     77.5 ml LV SV MOD BP:      55.2 ml RIGHT VENTRICLE             IVC RV S prime:     15.90 cm/s  IVC diam: 1.50 cm TAPSE (M-mode): 2.5 cm LEFT ATRIUM             Index        RIGHT ATRIUM          Index LA diam:        2.90 cm 1.29 cm/m   RA Area:     9.23 cm LA Vol (A2C):   28.6  ml 12.70 ml/m  RA Volume:   16.90 ml 7.51 ml/m LA Vol (A4C):   30.8 ml 13.68 ml/m LA Biplane Vol: 29.6 ml 13.15 ml/m  AORTIC VALVE LVOT Vmax:   124.00 cm/s LVOT Vmean:  84.800 cm/s LVOT VTI:    0.237 m  AORTA Ao Root diam: 2.75 cm Ao Asc diam:  3.00 cm MITRAL VALVE MV Area (PHT): 4.08 cm    SHUNTS MV Decel Time: 186 msec    Systemic VTI:  0.24 m MV E velocity: 77.80 cm/s  Systemic Diam: 2.20 cm MV A velocity: 86.20 cm/s MV E/A ratio:  0.90 Jodelle Red MD Electronically signed by Jodelle Red MD Signature Date/Time: 10/20/2023/8:41:30 PM    Final     LOS: 1 day   Total time spent in review of labs and imaging, patient evaluation, formulation of plan, documentation and communication with patient/family: 35 minutes  Lanae Boast, MD [facility] 10/21/2023, 11:48 AM

## 2023-10-21 NOTE — Progress Notes (Signed)
 Patient Name: [name] Date of Encounter: 10/21/2023 [facility] Cardiologist: Chilton Si, MD   Interval Summary  .    Shortness of breath similar to yesterday  Vital Signs .    Vitals:   10/20/23 1036 10/20/23 2001 10/20/23 2108 10/21/23 0257  BP:   (!) 128/92   Pulse:   (!) 104   Resp:   20   Temp:   (!) 97.5 F (36.4 C)   TempSrc:   Oral   SpO2: 95% 96% 96% 97%  Weight:      Height:        Intake/Output Summary (Last 24 hours) at 10/21/2023 0931 Last data filed at 10/20/2023 1300 Gross per 24 hour  Intake 120 ml  Output --  Net 120 ml      10/18/2023   11:15 PM 09/13/2023    6:42 AM 09/03/2023    8:00 AM  Last 3 Weights  Weight (lbs) 294 lb 275 lb 276 lb  Weight (kg) 133.358 kg 124.739 kg 125.193 kg      Telemetry/ECG    Normal sinus rhythm typically in the 80's had a brief episode of sinus tach from 2240-2255 yesterday. Has significantly less PVC's on telemetry as she only had 8 PVC's over the last 8 hours - Personally Reviewed  Physical Exam .   GEN: No acute distress.   Neck: No JVD Cardiac: RRR, no murmurs, rubs, or gallops.  Respiratory: Clear to auscultation bilaterally. GI: Soft, nontender, non-distended  MS: No edema  Assessment & Plan .     [name] is a 48 y.o. female with a hx of morbid obesity, essential hypertension, prediabetes, GERD, hypercholesterolemia, sleep apnea not on CPAP, fibroids, menorrhagia, iron deficiency anemia, asthma, allergies who is being seen 10/20/2023 for the evaluation of the bradycardia at the request of Sanda Klein MD.     Asthma exacerbation Patient presented complaining of worsening shortness of breath. Was tachypneic, tachycardic, and hypertensive in the ER. was admitted for an asthma exacerbation and started on nebulizers (DuoNeb, albuterol, levalbuterol, Solu-Medrol).  Also received a dose of IV metoprolol 5 milligrams.) -- recommend Xopenex rather than albuteral for asthma -- Management  per primary   Frequent PVC's and episodes of trigeminy Bradycardia Cardiology was consulted due to concerns of bradycardia. Episodes of bradycardia in the 30's were registered on pulse oxymetry while EKG rate was in the 100's. On telemetry the patient has frequent PVC's and no bradycardia. Patient denied having lightheadedness or vision changes but did report having palpitations on 10/19/23. Denies having palpitations over the past 2 days. Has had palpitations with using albuterol in the past. At times feels like her heart is skipping a beat but these symptoms do not appear to bother her much. -- The discrepancy in the rates on telemetry and pulse ox are on 10/19/23 at 1200 are because the PVC's are not detected on pulse ox. The patient received metoprolol shortly after noon and this did not appear to improve her PVC burden. -- Echo showed an LVEF of 70-75%, and mild LVH, grade 1 diastolic dysfunction. -- Patient denies using nicotine products. Denies consuming drinks or other products that contain caffeine.  -- She had no PVC's on initial EKG on 10/18/23 after receiving nebulizes (Duoneb, and albuterol) for asthma exacerbation she had frequent trigeminy and an elevated PVC burden on 10/19/23 to 10/20/23. Today after only using Xopenex over the past day she only had 8 PVC's over the last 8 hours. Suspect  that PVC's are secondary to albuteral use -- TSH normal -- Magnesium slightly elevated at 2.5. Potassium normal -- recommend Xopenex rather than albuteral for asthma. -- Ordered a 2 week zio outpatient to see if patient has elevated PVC burden outpatient. -- Schedule follow up with cardiology to review findings.      Essential hypertension BP currently well controlled on triamterene/hydrochlorothiazide, and amlodipine were started.  -- Continue triamterene/hydrochlorothiazide 37.5-25 daily, and amlodipine 5mg  daily     Hyperlipidemia --Continue Crestor 10 mg daily     Obstructive sleep apnea --  Needs outpatient sleep study   Other conditions management per primary.  For questions or updates, please contact [facility] Please consult [email or web address] for contact info under        Signed, Arabella Merles, PA-C

## 2023-10-21 NOTE — Plan of Care (Signed)

## 2023-10-22 ENCOUNTER — Other Ambulatory Visit (HOSPITAL_COMMUNITY): Payer: Self-pay

## 2023-10-22 DIAGNOSIS — J4551 Severe persistent asthma with (acute) exacerbation: Secondary | ICD-10-CM | POA: Diagnosis not present

## 2023-10-22 LAB — GLUCOSE, CAPILLARY
Glucose-Capillary: 113 mg/dL — ABNORMAL HIGH (ref 70–99)
Glucose-Capillary: 131 mg/dL — ABNORMAL HIGH (ref 70–99)

## 2023-10-22 MED ORDER — AMLODIPINE BESYLATE 5 MG PO TABS
5.0000 mg | ORAL_TABLET | Freq: Every day | ORAL | 0 refills | Status: AC
  Filled 2023-10-22: qty 30, 30d supply, fill #0

## 2023-10-22 MED ORDER — METHYLPREDNISOLONE 4 MG PO TBPK
ORAL_TABLET | ORAL | 0 refills | Status: AC
  Filled 2023-10-22: qty 21, 6d supply, fill #0

## 2023-10-22 MED ORDER — GUAIFENESIN-DM 100-10 MG/5ML PO SYRP
5.0000 mL | ORAL_SOLUTION | ORAL | 0 refills | Status: AC | PRN
Start: 2023-10-22 — End: ?
  Filled 2023-10-22: qty 118, 4d supply, fill #0

## 2023-10-22 MED ORDER — ALBUTEROL SULFATE HFA 108 (90 BASE) MCG/ACT IN AERS
2.0000 | INHALATION_SPRAY | Freq: Four times a day (QID) | RESPIRATORY_TRACT | 0 refills | Status: AC | PRN
  Filled 2023-10-22: qty 6.7, 30d supply, fill #0
  Filled 2023-10-22: qty 18, 30d supply, fill #0

## 2023-10-22 MED ORDER — ENOXAPARIN SODIUM 60 MG/0.6ML IJ SOSY: 60.0000 mg | PREFILLED_SYRINGE | INTRAMUSCULAR | Status: DC

## 2023-10-22 NOTE — Plan of Care (Signed)

## 2023-10-22 NOTE — Progress Notes (Signed)
 Patient received discharge orders to go home. Patient was given discharge paperwork/instructions. RN went over discharge paperwork/instructions with the patient. Any questions/concerns were addressed/answered during this time to the best of RN's ability. Patient received discharge medications from the pharmacy prior to discharge. Patient left the hospital stable, had discharge medications, had discharge paperwork/instructions, and had all personal belongings.

## 2023-10-22 NOTE — Discharge Summary (Signed)
 Physician Discharge Summary  SHARLENA KRISTENSEN ZOX:[id number] DOB: 1975/07/28 DOA: 10/18/2023  PCP: Diamantina Providence, FNP  Admit date: 10/18/2023 Discharge date: 10/22/2023 Recommendations for Outpatient Follow-up:  Follow up with PCP in 1 weeks-call for appointment Please obtain BMP/CBC in one week  Discharge Dispo: Home  Discharge Condition: Stable Code Status:   Code Status: Full Code Diet recommendation:  Diet Order             Diet heart healthy/carb modified Fluid consistency: Thin  Diet effective now                    Brief/Interim Summary: 48 year old female with known history of asthma who ran out of dupilumab for the last 1 month presents with worsening exacerbation of her asthma able to talk sentences but continues to be diffusely wheezing .  Seen in the ED, labs with mild hypokalemia mild leukocytosis AND WAS admitted for further observation of asthma exacerbation.  Chest x-ray 4/7 with mild bronchial thickening. Managed with IV steroids.  Overall clinically improving.  Cardiology consulted due to PVC burden-plan for outpatient Zio patch Patient is slowly improving with bronchodilators systemic steroids antitussives. She feels ready for home today  Subjective: Seen and examined overnight no hypoxia doing well on room air  Feels ready for home today  Discharge Diagnoses:  Severe persistent asthma with acute exacerbation Allergic rhinitis: At home on Symbicort, albuterol hfa and DUPILUMAB Foyil 2 wkly.  Treating with aggressive IV Solu-Medrol aggressive bronchodilators Breo She is able to ambulate and doing well RA and feels ready for home We will do Medrol Dosepak- albuterol hf inhaler, her symbicort was sent by her PCP already and she will cont on her injection as well Continue antitussives.    Leukocytosis: Chest x-ray no obvious pneumonia on 4/7.  Likely from steroid.  Afebrile.  Monitor  Hypertension PVC burden Bradycardia: BP stable continue amlodipine ( new)  maxizide for BP. Seen by cardiology due to bradycardia in 30s-likely due to PVCs, echo showed EF 70 to 75% G1 DD, TSH normal monitor electrolytes planning for ZIO outpatient patch  HLD: Continue statin   Morbid obesity bmi 54: Advise outpatient sleep apnea evaluation and PCP follow-up for healthy lifestyle and weight loss  Discharge Exam: Vitals:   10/22/23 0806 10/22/23 0903  BP:  (!) 141/96  Pulse:  92  Resp:    Temp:    SpO2: 96%    General: Pt is alert, awake, not in acute distress Cardiovascular: RRR, S1/S2 +, no rubs, no gallops Respiratory: CTA bilaterally, no wheezing, no rhonchi Abdominal: Soft, NT, ND, bowel sounds + Extremities: no edema, no cyanosis  Discharge Instructions  Discharge Instructions     Discharge instructions   Complete by: As directed    Please call call MD or return to ER for similar or worsening recurring problem that brought you to hospital or if any fever,nausea/vomiting,abdominal pain, uncontrolled pain, chest pain,  shortness of breath or any other alarming symptoms.  Please follow-up your doctor as instructed in a week time and call the office for appointment.  Please avoid alcohol, smoking, or any other illicit substance and maintain healthy habits including taking your regular medications as prescribed.  You were cared for by a hospitalist during your hospital stay. If you have any questions about your discharge medications or the care you received while you were in the hospital after you are discharged, you can call the unit and ask to speak with the hospitalist on call  if the hospitalist that took care of you is not available.  Once you are discharged, your primary care physician will handle any further medical issues. Please note that NO REFILLS for any discharge medications will be authorized once you are discharged, as it is imperative that you return to your primary care physician (or establish a relationship with a primary care physician  if you do not have one) for your aftercare needs so that they can reassess your need for medications and monitor your lab values   Increase activity slowly   Complete by: As directed       Allergies as of 10/22/2023       Reactions   Bee Venom Anaphylaxis   Patient has an epi pen, also has not been stung before but was noted to be allergic due to allergy testing    Prednisone Hives, Itching, Nausea Only, Rash, Swelling   Patient has been rechallenged with prednisone and experienced the same symptoms of hives and face swelling.  She can tolerate methylprednisolone IV and PO. prednisone   Losartan Potassium    Naproxen Hives        Medication List     TAKE these medications    albuterol 108 (90 Base) MCG/ACT inhaler Commonly known as: VENTOLIN HFA Inhale 2 puffs into the lungs every 6 (six) hours as needed for wheezing or shortness of breath.   amLODipine 5 MG tablet Commonly known as: NORVASC Take 1 tablet (5 mg total) by mouth daily. Start taking on: October 23, 2023   budesonide-formoterol 160-4.5 MCG/ACT inhaler Commonly known as: Symbicort Inhale 2 puffs into the lungs 2 (two) times daily.   DUPILUMAB [place] Inject 2 mLs into the skin every 14 (fourteen) days.   EPINEPHrine 0.3 mg/0.3 mL Soaj injection Commonly known as: EPI-PEN Inject 0.3 mg into the muscle once as needed for up to 1 dose.   loratadine 10 MG tablet Commonly known as: CLARITIN Take 1 tablet (10 mg total) by mouth daily.   methylPREDNISolone 4 MG Tbpk tablet Commonly known as: MEDROL DOSEPAK Take as instructed in packet   ondansetron 4 MG disintegrating tablet Commonly known as: ZOFRAN-ODT Place 2 tablets (8 mg total) under the tongue 3 (three) times daily as needed for nausea for 14 days.   rosuvastatin 10 MG tablet Commonly known as: CRESTOR Take 1 tablet (10 mg total) by mouth at bedtime.   triamterene-hydrochlorothiazide 37.5-25 MG tablet Commonly known as: MAXZIDE-25 Take 1 tablet by  mouth daily.   Tussin DM 100-10 MG/5ML liquid Generic drug: Dextromethorphan-guaiFENesin Take 5 mLs by mouth every 4 (four) hours as needed for cough.   Wegovy 1 MG/0.5ML Soaj Generic drug: Semaglutide-Weight Management Inject 1 mg into the skin once a week.        Follow-up Information     Diamantina Providence, FNP Follow up in 1 week(s).   Specialty: Nurse Practitioner Contact information: [address] [place] [address] [phone]                Allergies  Allergen Reactions   Bee Venom Anaphylaxis    Patient has an epi pen, also has not been stung before but was noted to be allergic due to allergy testing    Prednisone Hives, Itching, Nausea Only, Rash and Swelling    Patient has been rechallenged with prednisone and experienced the same symptoms of hives and face swelling.  She can tolerate methylprednisolone IV and PO.  prednisone   Losartan Potassium  Naproxen Hives    The results of significant diagnostics from this hospitalization (including imaging, microbiology, ancillary and laboratory) are listed below for reference.    Microbiology: No results found for this or any previous visit (from the past 240 hours).  Procedures/Studies: ECHOCARDIOGRAM COMPLETE Result Date: 10/20/2023    ECHOCARDIOGRAM REPORT   Patient Name:   [name] Date of Exam: 10/20/2023 Medical Rec #:  [id number]      Height:       62.0 in Accession #:    [id number]     Weight:       294.0 lb Date of Birth:  [birth year]      BSA:          2.252 m Patient Age:    47 years       BP:           145/86 mmHg Patient Gender: F              HR:           98 bpm. Exam Location:  Inpatient Procedure: 2D Echo, Cardiac Doppler and Color Doppler (Both Spectral and Color            Flow Doppler were utilized during procedure). Indications:    R94.31 Abnormal EKG; R06.02 SOB  History:        Patient has prior history of Echocardiogram examinations, most                 recent 11/03/2022.  Abnormal ECG, Arrythmias:Tachycardia,                 Bradycardia and Trigeminy, Signs/Symptoms:Shortness of Breath;                 Risk Factors:Sleep Apnea, Hypertension and Dyslipidemia. LV                 gradient due to high cardiac output.  Sonographer:    Sheralyn Boatman RDCS Referring Phys: [id number] DAVID MANUEL ORTIZ  Sonographer Comments: Patient is obese and Technically difficult study due to poor echo windows. Image acquisition challenging due to patient body habitus. Patient coughing throughout exam. IMPRESSIONS  1. Left ventricular ejection fraction, by estimation, is 70 to 75%. The left ventricle has hyperdynamic function. The left ventricle has no regional wall motion abnormalities. There is mild concentric left ventricular hypertrophy. Left ventricular diastolic parameters are consistent with Grade I diastolic dysfunction (impaired relaxation).  2. Right ventricular systolic function is normal. The right ventricular size is normal. Tricuspid regurgitation signal is inadequate for assessing PA pressure.  3. The mitral valve is grossly normal. No evidence of mitral valve regurgitation. No evidence of mitral stenosis.  4. The aortic valve was not well visualized. Aortic valve regurgitation is not visualized. No aortic stenosis is present.  5. The inferior vena cava is normal in size with greater than 50% respiratory variability, suggesting right atrial pressure of 3 mmHg. Comparison(s): No significant change from prior study. FINDINGS  Left Ventricle: LV mid cavitary gradient 32 mmHg at rest. LVOT gradient 27 mmHg with Valsalva. Not consistent with significant obstruction. Left ventricular ejection fraction, by estimation, is 70 to 75%. The left ventricle has hyperdynamic function. The left ventricle has no regional wall motion abnormalities. The left ventricular internal cavity size was normal in size. There is mild concentric left ventricular hypertrophy. Left ventricular diastolic parameters are consistent  with Grade I diastolic  dysfunction (impaired relaxation). Right Ventricle: The right ventricular size is normal. Right  vetricular wall thickness was not well visualized. Right ventricular systolic function is normal. Tricuspid regurgitation signal is inadequate for assessing PA pressure. Left Atrium: Left atrial size was normal in size. Right Atrium: Right atrial size was normal in size. Pericardium: Trivial pericardial effusion is present. Presence of epicardial fat layer. Mitral Valve: The mitral valve is grossly normal. No evidence of mitral valve regurgitation. No evidence of mitral valve stenosis. Tricuspid Valve: The tricuspid valve is not well visualized. Tricuspid valve regurgitation is trivial. No evidence of tricuspid stenosis. Aortic Valve: The aortic valve was not well visualized. Aortic valve regurgitation is not visualized. No aortic stenosis is present. Pulmonic Valve: The pulmonic valve was not well visualized. Pulmonic valve regurgitation is not visualized. No evidence of pulmonic stenosis. Aorta: The aortic root and ascending aorta are structurally normal, with no evidence of dilitation. Venous: The inferior vena cava is normal in size with greater than 50% respiratory variability, suggesting right atrial pressure of 3 mmHg. IAS/Shunts: No atrial level shunt detected by color flow Doppler.  LEFT VENTRICLE PLAX 2D LVIDd:         4.55 cm     Diastology LVIDs:         2.80 cm     LV e' medial:    8.05 cm/s LV PW:         1.20 cm     LV E/e' medial:  9.7 LV IVS:        1.20 cm     LV e' lateral:   7.94 cm/s LVOT diam:     2.20 cm     LV E/e' lateral: 9.8 LV SV:         90 LV SV Index:   40 LVOT Area:     3.80 cm  LV Volumes (MOD) LV vol d, MOD A2C: 58.6 ml LV vol d, MOD A4C: 77.5 ml LV vol s, MOD A2C: 9.1 ml LV vol s, MOD A4C: 16.1 ml LV SV MOD A2C:     49.5 ml LV SV MOD A4C:     77.5 ml LV SV MOD BP:      55.2 ml RIGHT VENTRICLE             IVC RV S prime:     15.90 cm/s  IVC diam: 1.50 cm TAPSE  (M-mode): 2.5 cm LEFT ATRIUM             Index        RIGHT ATRIUM          Index LA diam:        2.90 cm 1.29 cm/m   RA Area:     9.23 cm LA Vol (A2C):   28.6 ml 12.70 ml/m  RA Volume:   16.90 ml 7.51 ml/m LA Vol (A4C):   30.8 ml 13.68 ml/m LA Biplane Vol: 29.6 ml 13.15 ml/m  AORTIC VALVE LVOT Vmax:   124.00 cm/s LVOT Vmean:  84.800 cm/s LVOT VTI:    0.237 m  AORTA Ao Root diam: 2.75 cm Ao Asc diam:  3.00 cm MITRAL VALVE MV Area (PHT): 4.08 cm    SHUNTS MV Decel Time: 186 msec    Systemic VTI:  0.24 m MV E velocity: 77.80 cm/s  Systemic Diam: 2.20 cm MV A velocity: 86.20 cm/s MV E/A ratio:  0.90 Jodelle Red MD Electronically signed by Jodelle Red MD Signature Date/Time: 10/20/2023/8:41:30 PM    Final    DG Chest 2 View Result Date: 10/19/2023 CLINICAL DATA:  Asthma. EXAM: CHEST - 2 VIEW COMPARISON:  11/02/2022 FINDINGS: The cardiomediastinal contours are normal. Mild bronchial thickening. Pulmonary vasculature is normal. No consolidation, pleural effusion, or pneumothorax. No acute osseous abnormalities are seen. IMPRESSION: Mild bronchial thickening. Electronically Signed   By: Narda Rutherford M.D.   On: 10/19/2023 00:08    Labs: BNP (last 3 results) Recent Labs    11/02/22 1646 10/19/23 0011  BNP 22.8 64.0   Basic Metabolic Panel: Recent Labs  Lab 10/19/23 0011 10/20/23 0351 10/20/23 1559  NA 136 136  --   K 3.4* 3.9  --   CL 103 106  --   CO2 26 20*  --   GLUCOSE 116* 96  --   BUN 11 10  --   CREATININE 0.78 0.60  --   CALCIUM 9.1 9.3  --   MG  --   --  2.5*   Liver Function Tests: Recent Labs  Lab 10/20/23 0351  AST 20  ALT 18  ALKPHOS 57  BILITOT 0.6  PROT 7.0  ALBUMIN 3.4*   No results for input(s): "LIPASE", "AMYLASE" in the last 168 hours. No results for input(s): "AMMONIA" in the last 168 hours. CBC: Recent Labs  Lab 10/19/23 0011 10/20/23 0351  WBC 13.7* 22.5*  HGB 12.1 12.3  HCT 40.5 40.7  MCV 74.6* 73.7*  PLT 325 314    Cardiac Enzymes: No results for input(s): "CKTOTAL", "CKMB", "CKMBINDEX", "TROPONINI" in the last 168 hours. BNP: Invalid input(s): "POCBNP" CBG: Recent Labs  Lab 10/21/23 1130 10/21/23 1633 10/21/23 2033 10/22/23 0759 10/22/23 1129  GLUCAP 151* 191* 147* 113* 131*   D-Dimer No results for input(s): "DDIMER" in the last 72 hours. Hgb A1c Recent Labs    10/20/23 0351  HGBA1C 6.0*   Lipid Profile No results for input(s): "CHOL", "HDL", "LDLCALC", "TRIG", "CHOLHDL", "LDLDIRECT" in the last 72 hours. Thyroid function studies Recent Labs    10/20/23 1559  TSH 0.358   Anemia work up No results for input(s): "VITAMINB12", "FOLATE", "FERRITIN", "TIBC", "IRON", "RETICCTPCT" in the last 72 hours. Urinalysis    Component Value Date/Time   COLORURINE YELLOW 11/04/2015 1215   APPEARANCEUR HAZY (A) 11/04/2015 1215   LABSPEC >1.030 (H) 11/04/2015 1215   PHURINE 5.5 11/04/2015 1215   GLUCOSEU NEGATIVE 11/04/2015 1215   HGBUR TRACE (A) 11/04/2015 1215   BILIRUBINUR NEGATIVE 11/04/2015 1215   KETONESUR NEGATIVE 11/04/2015 1215   PROTEINUR NEGATIVE 11/04/2015 1215   UROBILINOGEN 0.2 07/25/2012 1708   NITRITE NEGATIVE 11/04/2015 1215   LEUKOCYTESUR SMALL (A) 11/04/2015 1215   Sepsis Labs Recent Labs  Lab 10/19/23 0011 10/20/23 0351  WBC 13.7* 22.5*   Microbiology No results found for this or any previous visit (from the past 240 hours).   Time coordinating discharge: 35 minutes  SIGNED: Lanae Boast, MD  [facility] 10/22/2023, 12:10 PM  If 7PM-7AM, please contact night-coverage [email or web address]

## 2023-10-26 ENCOUNTER — Other Ambulatory Visit (HOSPITAL_COMMUNITY): Payer: Self-pay

## 2023-11-30 ENCOUNTER — Ambulatory Visit (HOSPITAL_BASED_OUTPATIENT_CLINIC_OR_DEPARTMENT_OTHER): Admitting: Family

## 2023-12-24 ENCOUNTER — Other Ambulatory Visit: Payer: Self-pay

## 2024-01-11 ENCOUNTER — Ambulatory Visit (HOSPITAL_COMMUNITY): Admission: EM | Admit: 2024-01-11 | Discharge: 2024-01-11 | Disposition: A | Discharge status: Home / Self Care

## 2024-01-11 ENCOUNTER — Ambulatory Visit (HOSPITAL_COMMUNITY)
Admission: RE | Admit: 2024-01-11 | Discharge: 2024-01-11 | Disposition: A | Discharge status: Home / Self Care | Source: Ambulatory Visit | Attending: Vascular Surgery | Admitting: Vascular Surgery

## 2024-01-11 ENCOUNTER — Encounter (HOSPITAL_COMMUNITY): Payer: Self-pay | Admitting: Emergency Medicine

## 2024-01-11 DIAGNOSIS — M7989 Other specified soft tissue disorders: Secondary | ICD-10-CM

## 2024-01-11 NOTE — ED Provider Notes (Signed)
 [facility]  Note:  This document was prepared using Dragon voice recognition software and may include unintentional dictation errors.  MRN: [id number] DOB: [birth year]  Subjective:   [name] is a 48 y.o. female presenting for bilateral lower leg swelling since this weekend.  Patient reports that she was out at the park when a friend noted that the back of her left leg was swollen and had some redness.  Patient reports mild itching and pain with palpation at that time but now she has developed swelling to bilateral legs.  Patient took Tylenol  with minimal relief.  Patient denies any history of CHF or cardiac history.  Patient does not believe that she was bit by an insect but does not recall exactly.  Patient is here for evaluation to determine the cause of her lower extremity swelling.  No current facility-administered medications for this encounter.  Current Outpatient Medications:    albuterol  (VENTOLIN  HFA) 108 (90 Base) MCG/ACT inhaler, Inhale 2 puffs into the lungs every 6 (six) hours as needed for wheezing or shortness of breath., Disp: 18 g, Rfl: 0   amLODipine  (NORVASC ) 5 MG tablet, Take 1 tablet (5 mg total) by mouth daily., Disp: 30 tablet, Rfl: 0   budesonide -formoterol  (SYMBICORT ) 160-4.5 MCG/ACT inhaler, Inhale 2 puffs into the lungs 2 (two) times daily., Disp: 1 each, Rfl: 3   DUPILUMAB [place], Inject 2 mLs into the skin every 14 (fourteen) days., Disp: , Rfl:    EPINEPHrine  0.3 mg/0.3 mL IJ SOAJ injection, Inject 0.3 mg into the muscle once as needed for up to 1 dose. (Patient not taking: Reported on 10/19/2023), Disp: 2 each, Rfl: 0   guaiFENesin -dextromethorphan  (ROBITUSSIN DM) 100-10 MG/5ML syrup, Take 5 mLs by mouth every 4 (four) hours as needed for cough., Disp: 118 mL, Rfl: 0   loratadine  (CLARITIN ) 10 MG tablet, Take 1 tablet (10 mg total) by mouth daily., Disp: 30 tablet, Rfl: 5   methylPREDNISolone  (MEDROL  DOSEPAK) 4 MG TBPK tablet, Take as instructed in  packet, Disp: 21 tablet, Rfl: 0   ondansetron  (ZOFRAN -ODT) 4 MG disintegrating tablet, Place 2 tablets (8 mg total) under the tongue 3 (three) times daily as needed for nausea for 14 days., Disp: 84 tablet, Rfl: 0   rosuvastatin  (CRESTOR ) 10 MG tablet, Take 1 tablet (10 mg total) by mouth at bedtime., Disp: 90 tablet, Rfl: 3   Semaglutide -Weight Management (WEGOVY ) 1 MG/0.5ML SOAJ, Inject 1 mg into the skin once a week., Disp: , Rfl:    triamterene -hydrochlorothiazide  (MAXZIDE -25) 37.5-25 MG tablet, Take 1 tablet by mouth daily., Disp: 90 tablet, Rfl: 1   Allergies  Allergen Reactions   Bee Venom Anaphylaxis    Patient has an epi pen, also has not been stung before but was noted to be allergic due to allergy  testing    Prednisone  Hives, Itching, Nausea Only, Rash and Swelling    Patient has been rechallenged with prednisone  and experienced the same symptoms of hives and face swelling.  She can tolerate methylprednisolone  IV and PO.  prednisone    Losartan  Potassium    Naproxen  Hives    Past Medical History:  Diagnosis Date   Anemia    Asthma    Blood transfusion without reported diagnosis    Environmental allergies    Fibroid, uterine    Fibroids    GERD (gastroesophageal reflux disease)    Hyperlipidemia    Hypertension    no meds   Menorrhagia    Seasonal allergies    chronic  Shortness of breath    Sleep apnea    does not use CPAP     Past Surgical History:  Procedure Laterality Date   ABDOMINAL HYSTERECTOMY Bilateral 02/08/2018   Procedure: HYSTERECTOMY ABDOMINAL WITH LEFT SALPINGO-OOPORECTOMY AND RIGHT SALPINGECTOMY;  Surgeon: Corene Coy, MD;  Location: [facility];  Service: Gynecology;  Laterality: Bilateral;   CHOLECYSTECTOMY     06/29/2011   CHOLECYSTECTOMY  06/29/2011   Procedure: LAPAROSCOPIC CHOLECYSTECTOMY WITH INTRAOPERATIVE CHOLANGIOGRAM;  Surgeon: Deward GORMAN Curvin DOUGLAS, MD;  Location: [facility] OR;  Service: General;  Laterality: N/A;   COLONOSCOPY WITH  PROPOFOL  N/A 09/13/2023   Procedure: COLONOSCOPY WITH PROPOFOL ;  Surgeon: Stacia Glendia BRAVO, MD;  Location: [facility];  Service: Gastroenterology;  Laterality: N/A;   ECTOPIC PREGNANCY SURGERY     Laparoscopic   ERCP  06/30/2011   Procedure: ENDOSCOPIC RETROGRADE CHOLANGIOPANCREATOGRAPHY (ERCP);  Surgeon: Oliva BRAVO Boots, MD;  Location[facility] ENDOSCOPY;  Service: Endoscopy;  Laterality: N/A;  probable sphincterotomy   ERCP  07/03/2011   Procedure: ENDOSCOPIC RETROGRADE CHOLANGIOPANCREATOGRAPHY (ERCP);  Surgeon: Elsie CHRISTELLA Cree, MD;  [facility] OR;  Service: Endoscopy;  Laterality: N/A;  PROPOFOL    MYOMECTOMY  2014   MYOMECTOMY N/A 11/14/2012   Procedure: MYOMECTOMY;  Surgeon: Gloris DELENA Hugger, MD;  Location: [facility];  Service: Gynecology;  Laterality: N/A;   NASAL POLYP EXCISION  2016   in [place]   POLYPECTOMY  09/13/2023   Procedure: POLYPECTOMY;  Surgeon: Stacia Glendia BRAVO, MD;  Location: [facility] ENDOSCOPY;  Service: Gastroenterology;;   WISDOM TOOTH EXTRACTION      Family History  Problem Relation Age of Onset   Hypertension Mother    Diabetes Mother    Hypertension Father    Hypertension Brother    Anesthesia problems Neg Hx    Hypotension Neg Hx    Malignant hyperthermia Neg Hx    Pseudochol deficiency Neg Hx    Colon cancer Neg Hx    Rectal cancer Neg Hx    Stomach cancer Neg Hx    Esophageal cancer Neg Hx     Social History   Tobacco Use   Smoking status: Never   Smokeless tobacco: Never  Vaping Use   Vaping status: Never Used  Substance Use Topics   Alcohol use: Yes    Comment: occasional   Drug use: No    ROS Refer to HPI for ROS details.  Objective:   Vitals: BP (!) 154/101 (BP Location: Left Arm)   Pulse 98   Temp 98 F (36.7 C) (Oral)   Resp 18   LMP 12/19/2017   SpO2 96%   Physical Exam Vitals and nursing note reviewed.  Constitutional:      General: She is not in acute distress.    Appearance: Normal appearance. She is well-developed. She is  not ill-appearing or toxic-appearing.  HENT:     Head: Normocephalic and atraumatic.   Cardiovascular:     Rate and Rhythm: Normal rate.  Pulmonary:     Effort: Pulmonary effort is normal. No respiratory distress.   Musculoskeletal:        General: Normal range of motion.     Right lower leg: Swelling and tenderness present. No deformity or bony tenderness. 2+ Edema present.     Left lower leg: Swelling and tenderness present. No deformity or bony tenderness. 2+ Edema present.     Right ankle: Swelling present. No tenderness. Normal range of motion.     Left ankle: Swelling present. No tenderness. Normal  range of motion.   Skin:    General: Skin is warm and dry.   Neurological:     General: No focal deficit present.     Mental Status: She is alert and oriented to person, place, and time.   Psychiatric:        Mood and Affect: Mood normal.        Behavior: Behavior normal.     Procedures  No results found for this or any previous visit (from the past 24 hours).  No results found.   Assessment and Plan :     Discharge Instructions       1. Leg swelling (Primary) - VAS US  LOWER EXTREMITY VENOUS (DVT) ordered for outpatient follow-up at 1220 and outpatient Doppler ultrasound. - Please arrive for appointment at 12:20 AM on [address] for check-in for DVT ultrasound. - If DVT ultrasound is negative, keep legs elevated, wear compression socks, refrain from eating any high sodium foods to help decrease swelling. - Follow-up with primary care provider for further evaluation and management of lower leg swelling. -Continue to monitor symptoms for any change in severity if there is any escalation of current symptoms or development of new symptoms follow-up in ER for further evaluation and management.      Lovis More B Krissie Merrick   Syreeta Figler, [place], [place] 01/11/24 1158

## 2024-01-11 NOTE — Discharge Instructions (Signed)
  1. Leg swelling (Primary) - VAS US  LOWER EXTREMITY VENOUS (DVT) ordered for outpatient follow-up at 1220 and outpatient Doppler ultrasound. - Please arrive for appointment at 12:20 AM on [address] for check-in for DVT ultrasound. - If DVT ultrasound is negative, keep legs elevated, wear compression socks, refrain from eating any high sodium foods to help decrease swelling. - Follow-up with primary care provider for further evaluation and management of lower leg swelling. -Continue to monitor symptoms for any change in severity if there is any escalation of current symptoms or development of new symptoms follow-up in ER for further evaluation and management.

## 2024-01-11 NOTE — ED Triage Notes (Signed)
 Pt reports bilateral lower leg swelling. Reports this weekend had swelling and redness to left lower leg and now having on right leg swelling, pain. Took Tylenol  last night

## 2024-01-12 ENCOUNTER — Ambulatory Visit (HOSPITAL_COMMUNITY): Payer: Self-pay

## 2024-01-12 ENCOUNTER — Ambulatory Visit (INDEPENDENT_AMBULATORY_CARE_PROVIDER_SITE_OTHER)

## 2024-01-12 ENCOUNTER — Ambulatory Visit: Admitting: Podiatry

## 2024-01-12 ENCOUNTER — Ambulatory Visit (INDEPENDENT_AMBULATORY_CARE_PROVIDER_SITE_OTHER): Admitting: Podiatry

## 2024-01-12 DIAGNOSIS — M25472 Effusion, left ankle: Secondary | ICD-10-CM | POA: Diagnosis not present

## 2024-01-12 DIAGNOSIS — M216X2 Other acquired deformities of left foot: Secondary | ICD-10-CM | POA: Diagnosis not present

## 2024-01-12 DIAGNOSIS — S93402A Sprain of unspecified ligament of left ankle, initial encounter: Secondary | ICD-10-CM

## 2024-01-12 NOTE — Progress Notes (Signed)
  Subjective:  Patient ID: [name], female    DOB: March 01, 1976,   MRN: [id number]  Chief Complaint  Patient presents with   Foot Pain    RM#21 Left ankle pain and swelling has had previous injury has use of cam boot for several weeks ankle is still swelling and painful.    48 y.o. female presents for concern of left ankle pain and swelling. Relates two years ago she sprained the ankle for the first time and has had swelling since. Relates 3-4 months ago she fell again and this time had a small fracture and was put in a boot. Relates she has continued to have pain in swelling int he ankle area. Denies any other treatments.   . Denies any other pedal complaints. Denies n/v/f/c.   Past Medical History:  Diagnosis Date   Anemia    Asthma    Blood transfusion without reported diagnosis    Environmental allergies    Fibroid, uterine    Fibroids    GERD (gastroesophageal reflux disease)    Hyperlipidemia    Hypertension    no meds   Menorrhagia    Seasonal allergies    chronic   Shortness of breath    Sleep apnea    does not use CPAP    Objective:  Physical Exam: Vascular: DP/PT pulses 2/4 bilateral. CFT <3 seconds. Normal hair growth on digits. No edema.  Skin. No lacerations or abrasions bilateral feet.  Musculoskeletal: MMT 5/5 bilateral lower extremities in DF, PF, Inversion and Eversion. Deceased ROM in DF of ankle joint. Edema noted to left lower extremity. Tender to anterior ankle joint line and most pain over AITFL and ATFL. Some pain over CFL but not as much. No pain medially. Negative anterior drawer. Pain with PF of the ankle.  Neurological: Sensation intact to light touch.   Assessment:   1. Ankle swelling, left      Plan:  Patient was evaluated and treated and all questions answered. X-rays reviewed and discussed with patient. No acute fractures or dislocations noted.  Discussed peroneal tendinitis and treatment options at length with patient Discussed  stretching exercises and provided handout. Iburpofen as needed.  Dispensed Tri-Lock ankle brace. Discussed that if the symptoms do not improve can consider PT/MRI. Patient to return in 6 to 8 weeks or sooner if symptoms fail to improve or worsen.   Asberry Failing, DPM

## 2024-01-12 NOTE — Patient Instructions (Signed)
Ankle Sprain, Phase I Rehab An ankle sprain is an injury to the tissues that connect bone to bone (ligaments) in your ankle. Ankle sprains can cause stiffness, loss of motion, and loss of strength. Ask your health care provider which exercises are safe for you. Do exercises exactly as told by your provider and adjust them as directed. It is normal to feel mild stretching, pulling, tightness, or discomfort as you do these exercises. Stop right away if you feel sudden pain or your pain gets worse. Do not begin these exercises until told by your provider. Stretching and range-of-motion exercises These exercises warm up your muscles and joints. They can improve the movement and flexibility of your lower leg and ankle. They also help to relieve pain and stiffness. Gastroc and soleus stretch This exercise is also called a calf stretch. It stretches the muscles in the back of the lower leg. These muscles are the gastrocnemius, or gastroc, and the soleus. Sit on the floor with your left / right leg extended. Loop a belt or towel around the ball of your left / right foot. The ball of your foot is on the walking surface, right under your toes. Keep your left / right ankle and foot relaxed and keep your knee straight. Use the belt or towel to pull your foot toward you. You should feel a gentle stretch behind your calf or knee in your gastroc muscle. Hold this position for __________ seconds, then release to the starting position. Repeat the exercise with your knee bent. You can put a pillow or a rolled bath towel under your knee to support it. You should feel a stretch deep in your calf in the soleus muscle or at your Achilles tendon. Repeat __________ times. Complete this exercise __________ times a day. Ankle alphabet  Sit with your left / right leg supported at the lower leg. Do not rest your foot on anything. Make sure your foot has room to move freely. Think of your left / right foot as a  paintbrush. Move your foot to trace each letter of the alphabet in the air. Keep your hip and knee still while you trace. Make the letters as large as you can without feeling discomfort. Trace every letter from A to Z. Repeat __________ times. Complete this exercise __________ times a day. Strengthening exercises These exercises build strength and endurance in your ankle and lower leg. Endurance is the ability to use your muscles for a long time, even after they get tired. Ankle dorsiflexion  Secure a rubber exercise band or tube to an object, such as a table leg, that will stay still when the band is pulled. Secure the other end around your left / right foot. Sit on the floor facing the object, with your left / right leg extended. The band or tube should be slightly tense when your foot is relaxed. Slowly bring your foot toward you, bringing the top of your foot toward your shin (dorsiflexion), and pulling the band tighter. Hold this position for __________ seconds. Slowly return your foot to the starting position. Repeat __________ times. Complete this exercise __________ times a day. Ankle plantar flexion  Sit on the floor with your left / right leg extended. Loop a rubber exercise tube or band around the ball of your left / right foot. The ball of your foot is on the walking surface, right under your toes. Hold the ends of the band or tube in your hands. The band or tube should be   slightly tense when your foot is relaxed. Slowly point your foot and toes downward to tilt the top of your foot away from your shin (plantar flexion). Hold this position for __________ seconds. Slowly return your foot to the starting position. Repeat __________ times. Complete this exercise __________ times a day. Ankle eversion  Sit on the floor with your legs straight out in front of you. Loop a rubber exercise band or tube around the ball of your left / right foot. The ball of your foot is on the walking  surface, right under your toes. Hold the ends of the band in your hands or secure the band to a stable object. The band or tube should be slightly tense when your foot is relaxed. Slowly push your foot outward, away from your other leg (eversion). Hold this position for __________ seconds. Slowly return your foot to the starting position. Repeat __________ times. Complete this exercise __________ times a day. This information is not intended to replace advice given to you by your health care provider. Make sure you discuss any questions you have with your health care provider. Document Revised: 04/22/2022 Document Reviewed: 04/22/2022 [employer] Patient Education  2024 [employer]  

## 2024-02-23 ENCOUNTER — Ambulatory Visit: Admitting: Podiatry

## 2024-02-23 DIAGNOSIS — Z91199 Patient's noncompliance with other medical treatment and regimen due to unspecified reason: Secondary | ICD-10-CM

## 2024-02-23 NOTE — Progress Notes (Signed)
 No show
